# Patient Record
Sex: Female | Born: 1985 | Race: Black or African American | Hispanic: No | Marital: Single | Smoking: Never smoker
Health system: Southern US, Community
[De-identification: ages and names within clinical notes are randomized; demographics above are authoritative.]

## PROBLEM LIST (undated history)

## (undated) ENCOUNTER — Ambulatory Visit (HOSPITAL_COMMUNITY): Disposition: A | Discharge status: Other Acute Inpatient Hospital | Payer: No Typology Code available for payment source

## (undated) DIAGNOSIS — E119 Type 2 diabetes mellitus without complications: Secondary | ICD-10-CM

## (undated) DIAGNOSIS — E109 Type 1 diabetes mellitus without complications: Secondary | ICD-10-CM

## (undated) DIAGNOSIS — E1010 Type 1 diabetes mellitus with ketoacidosis without coma: Secondary | ICD-10-CM

---
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
## 2007-01-22 ENCOUNTER — Inpatient Hospital Stay (HOSPITAL_COMMUNITY): Admission: EM | Admit: 2007-01-22 | Discharge: 2007-01-25 | Payer: Self-pay | Admitting: Emergency Medicine

## 2007-11-28 ENCOUNTER — Inpatient Hospital Stay (HOSPITAL_COMMUNITY): Admission: EM | Admit: 2007-11-28 | Discharge: 2007-12-01 | Payer: Self-pay | Admitting: Emergency Medicine

## 2008-02-11 ENCOUNTER — Inpatient Hospital Stay (HOSPITAL_COMMUNITY): Admission: EM | Admit: 2008-02-11 | Discharge: 2008-02-14 | Payer: Self-pay | Admitting: Emergency Medicine

## 2008-08-22 ENCOUNTER — Emergency Department (HOSPITAL_COMMUNITY): Admission: EM | Admit: 2008-08-22 | Discharge: 2008-08-23 | Payer: Self-pay | Admitting: Emergency Medicine

## 2009-01-18 ENCOUNTER — Emergency Department (HOSPITAL_COMMUNITY): Admission: EM | Admit: 2009-01-18 | Discharge: 2009-01-18 | Payer: Self-pay | Admitting: Family Medicine

## 2009-05-09 ENCOUNTER — Inpatient Hospital Stay (HOSPITAL_COMMUNITY): Admission: EM | Admit: 2009-05-09 | Discharge: 2009-05-12 | Payer: Self-pay | Admitting: Emergency Medicine

## 2009-09-09 ENCOUNTER — Other Ambulatory Visit: Payer: Self-pay | Admitting: Gynecology

## 2009-09-09 ENCOUNTER — Other Ambulatory Visit: Payer: Self-pay | Admitting: Dermatology

## 2009-09-09 ENCOUNTER — Ambulatory Visit: Payer: Self-pay | Admitting: Advanced Practice Midwife

## 2009-09-09 ENCOUNTER — Emergency Department (HOSPITAL_COMMUNITY): Admission: EM | Admit: 2009-09-09 | Discharge: 2009-09-09 | Payer: Self-pay | Admitting: Emergency Medicine

## 2009-09-17 ENCOUNTER — Emergency Department (HOSPITAL_COMMUNITY): Admission: EM | Admit: 2009-09-17 | Discharge: 2009-09-17 | Payer: Self-pay | Admitting: Emergency Medicine

## 2009-12-23 ENCOUNTER — Inpatient Hospital Stay (HOSPITAL_COMMUNITY): Admission: EM | Admit: 2009-12-23 | Discharge: 2009-12-27 | Payer: Self-pay | Admitting: Emergency Medicine

## 2009-12-25 ENCOUNTER — Ambulatory Visit: Payer: Self-pay | Admitting: Vascular Surgery

## 2009-12-25 ENCOUNTER — Encounter (INDEPENDENT_AMBULATORY_CARE_PROVIDER_SITE_OTHER): Payer: Self-pay | Admitting: Internal Medicine

## 2010-10-14 LAB — BASIC METABOLIC PANEL
BUN: 8 mg/dL (ref 6–23)
BUN: 10 mg/dL (ref 6–23)
BUN: 11 mg/dL (ref 6–23)
BUN: 16 mg/dL (ref 6–23)
BUN: 20 mg/dL (ref 6–23)
BUN: 6 mg/dL (ref 6–23)
BUN: 7 mg/dL (ref 6–23)
BUN: 9 mg/dL (ref 6–23)
BUN: 9 mg/dL (ref 6–23)
CO2: 26 mEq/L (ref 19–32)
CO2: 10 mEq/L — ABNORMAL LOW (ref 19–32)
CO2: 13 mEq/L — ABNORMAL LOW (ref 19–32)
CO2: 14 mEq/L — ABNORMAL LOW (ref 19–32)
CO2: 16 mEq/L — ABNORMAL LOW (ref 19–32)
CO2: 19 mEq/L (ref 19–32)
CO2: 19 mEq/L (ref 19–32)
CO2: 19 mEq/L (ref 19–32)
CO2: 7 mEq/L — CL (ref 19–32)
Calcium: 8.7 mg/dL (ref 8.4–10.5)
Calcium: 7.7 mg/dL — ABNORMAL LOW (ref 8.4–10.5)
Calcium: 7.7 mg/dL — ABNORMAL LOW (ref 8.4–10.5)
Calcium: 7.9 mg/dL — ABNORMAL LOW (ref 8.4–10.5)
Calcium: 8.1 mg/dL — ABNORMAL LOW (ref 8.4–10.5)
Calcium: 8.1 mg/dL — ABNORMAL LOW (ref 8.4–10.5)
Calcium: 8.3 mg/dL — ABNORMAL LOW (ref 8.4–10.5)
Calcium: 8.3 mg/dL — ABNORMAL LOW (ref 8.4–10.5)
Calcium: 8.7 mg/dL (ref 8.4–10.5)
Chloride: 111 mEq/L (ref 96–112)
Chloride: 109 mEq/L (ref 96–112)
Chloride: 111 mEq/L (ref 96–112)
Chloride: 112 mEq/L (ref 96–112)
Chloride: 113 mEq/L — ABNORMAL HIGH (ref 96–112)
Chloride: 113 mEq/L — ABNORMAL HIGH (ref 96–112)
Chloride: 114 mEq/L — ABNORMAL HIGH (ref 96–112)
Chloride: 114 mEq/L — ABNORMAL HIGH (ref 96–112)
Chloride: 114 mEq/L — ABNORMAL HIGH (ref 96–112)
Creatinine, Ser: 0.5 mg/dL (ref 0.4–1.2)
Creatinine, Ser: 0.5 mg/dL (ref 0.4–1.2)
Creatinine, Ser: 0.61 mg/dL (ref 0.4–1.2)
Creatinine, Ser: 0.65 mg/dL (ref 0.4–1.2)
Creatinine, Ser: 0.67 mg/dL (ref 0.4–1.2)
Creatinine, Ser: 0.83 mg/dL (ref 0.4–1.2)
Creatinine, Ser: 0.94 mg/dL (ref 0.4–1.2)
Creatinine, Ser: 1.01 mg/dL (ref 0.4–1.2)
Creatinine, Ser: 1.4 mg/dL — ABNORMAL HIGH (ref 0.4–1.2)
GFR calc Af Amer: >60 mL/min (ref >60)
GFR calc Af Amer: 56 mL/min — ABNORMAL LOW (ref >60)
GFR calc Af Amer: >60 mL/min (ref >60)
GFR calc Af Amer: >60 mL/min (ref >60)
GFR calc Af Amer: >60 mL/min (ref >60)
GFR calc Af Amer: >60 mL/min (ref >60)
GFR calc Af Amer: >60 mL/min (ref >60)
GFR calc Af Amer: >60 mL/min (ref >60)
GFR calc Af Amer: >60 mL/min (ref >60)
GFR calc non Af Amer: >60 mL/min (ref >60)
GFR calc non Af Amer: 47 mL/min — ABNORMAL LOW (ref >60)
GFR calc non Af Amer: >60 mL/min (ref >60)
GFR calc non Af Amer: >60 mL/min (ref >60)
GFR calc non Af Amer: >60 mL/min (ref >60)
GFR calc non Af Amer: >60 mL/min (ref >60)
GFR calc non Af Amer: >60 mL/min (ref >60)
GFR calc non Af Amer: >60 mL/min (ref >60)
GFR calc non Af Amer: >60 mL/min (ref >60)
Glucose, Bld: 62 mg/dL — ABNORMAL LOW (ref 70–99)
Glucose, Bld: 122 mg/dL — ABNORMAL HIGH (ref 70–99)
Glucose, Bld: 129 mg/dL — ABNORMAL HIGH (ref 70–99)
Glucose, Bld: 144 mg/dL — ABNORMAL HIGH (ref 70–99)
Glucose, Bld: 179 mg/dL — ABNORMAL HIGH (ref 70–99)
Glucose, Bld: 187 mg/dL — ABNORMAL HIGH (ref 70–99)
Glucose, Bld: 261 mg/dL — ABNORMAL HIGH (ref 70–99)
Glucose, Bld: 391 mg/dL — ABNORMAL HIGH (ref 70–99)
Glucose, Bld: 74 mg/dL (ref 70–99)
Potassium: 3.7 mEq/L (ref 3.5–5.1)
Potassium: 3.2 mEq/L — ABNORMAL LOW (ref 3.5–5.1)
Potassium: 3.6 mEq/L (ref 3.5–5.1)
Potassium: 3.7 mEq/L (ref 3.5–5.1)
Potassium: 3.8 mEq/L (ref 3.5–5.1)
Potassium: 4.5 mEq/L (ref 3.5–5.1)
Potassium: 4.6 mEq/L (ref 3.5–5.1)
Potassium: 4.6 mEq/L (ref 3.5–5.1)
Potassium: 4.9 mEq/L (ref 3.5–5.1)
Sodium: 143 mEq/L (ref 135–145)
Sodium: 132 mEq/L — ABNORMAL LOW (ref 135–145)
Sodium: 133 mEq/L — ABNORMAL LOW (ref 135–145)
Sodium: 134 mEq/L — ABNORMAL LOW (ref 135–145)
Sodium: 134 mEq/L — ABNORMAL LOW (ref 135–145)
Sodium: 135 mEq/L (ref 135–145)
Sodium: 137 mEq/L (ref 135–145)
Sodium: 138 mEq/L (ref 135–145)
Sodium: 138 mEq/L (ref 135–145)

## 2010-10-14 LAB — LIPID PANEL
Cholesterol: 195 mg/dL (ref 0–200)
HDL: 79 mg/dL (ref >39)
LDL Cholesterol: 94 mg/dL (ref 0–99)
Total CHOL/HDL Ratio: 2.5 RATIO
Triglycerides: 108 mg/dL (ref <150)
VLDL: 22 mg/dL (ref 0–40)

## 2010-10-14 LAB — GLUCOSE, CAPILLARY
Glucose-Capillary: 123 mg/dL — ABNORMAL HIGH (ref 70–99)
Glucose-Capillary: 128 mg/dL — ABNORMAL HIGH (ref 70–99)
Glucose-Capillary: 144 mg/dL — ABNORMAL HIGH (ref 70–99)
Glucose-Capillary: 156 mg/dL — ABNORMAL HIGH (ref 70–99)
Glucose-Capillary: 228 mg/dL — ABNORMAL HIGH (ref 70–99)
Glucose-Capillary: 243 mg/dL — ABNORMAL HIGH (ref 70–99)
Glucose-Capillary: 328 mg/dL — ABNORMAL HIGH (ref 70–99)
Glucose-Capillary: 42 mg/dL — CL (ref 70–99)
Glucose-Capillary: 63 mg/dL — ABNORMAL LOW (ref 70–99)
Glucose-Capillary: 65 mg/dL — ABNORMAL LOW (ref 70–99)
Glucose-Capillary: 73 mg/dL (ref 70–99)
Glucose-Capillary: 94 mg/dL (ref 70–99)
Glucose-Capillary: 101 mg/dL — ABNORMAL HIGH (ref 70–99)
Glucose-Capillary: 118 mg/dL — ABNORMAL HIGH (ref 70–99)
Glucose-Capillary: 127 mg/dL — ABNORMAL HIGH (ref 70–99)
Glucose-Capillary: 130 mg/dL — ABNORMAL HIGH (ref 70–99)
Glucose-Capillary: 136 mg/dL — ABNORMAL HIGH (ref 70–99)
Glucose-Capillary: 137 mg/dL — ABNORMAL HIGH (ref 70–99)
Glucose-Capillary: 141 mg/dL — ABNORMAL HIGH (ref 70–99)
Glucose-Capillary: 143 mg/dL — ABNORMAL HIGH (ref 70–99)
Glucose-Capillary: 162 mg/dL — ABNORMAL HIGH (ref 70–99)
Glucose-Capillary: 162 mg/dL — ABNORMAL HIGH (ref 70–99)
Glucose-Capillary: 170 mg/dL — ABNORMAL HIGH (ref 70–99)
Glucose-Capillary: 174 mg/dL — ABNORMAL HIGH (ref 70–99)
Glucose-Capillary: 178 mg/dL — ABNORMAL HIGH (ref 70–99)
Glucose-Capillary: 182 mg/dL — ABNORMAL HIGH (ref 70–99)
Glucose-Capillary: 185 mg/dL — ABNORMAL HIGH (ref 70–99)
Glucose-Capillary: 190 mg/dL — ABNORMAL HIGH (ref 70–99)
Glucose-Capillary: 204 mg/dL — ABNORMAL HIGH (ref 70–99)
Glucose-Capillary: 204 mg/dL — ABNORMAL HIGH (ref 70–99)
Glucose-Capillary: 208 mg/dL — ABNORMAL HIGH (ref 70–99)
Glucose-Capillary: 220 mg/dL — ABNORMAL HIGH (ref 70–99)
Glucose-Capillary: 222 mg/dL — ABNORMAL HIGH (ref 70–99)
Glucose-Capillary: 249 mg/dL — ABNORMAL HIGH (ref 70–99)
Glucose-Capillary: 264 mg/dL — ABNORMAL HIGH (ref 70–99)
Glucose-Capillary: 330 mg/dL — ABNORMAL HIGH (ref 70–99)
Glucose-Capillary: 547 mg/dL — ABNORMAL HIGH (ref 70–99)
Glucose-Capillary: 61 mg/dL — ABNORMAL LOW (ref 70–99)
Glucose-Capillary: 74 mg/dL (ref 70–99)
Glucose-Capillary: 75 mg/dL (ref 70–99)
Glucose-Capillary: 75 mg/dL (ref 70–99)
Glucose-Capillary: 92 mg/dL (ref 70–99)

## 2010-10-14 LAB — LACTIC ACID, PLASMA: Lactic Acid, Venous: 2 mmol/L (ref 0.5–2.2)

## 2010-10-14 LAB — BLOOD GAS, ARTERIAL
Acid-base deficit: 22.7 mmol/L — ABNORMAL HIGH (ref 0.0–2.0)
Bicarbonate: 5.6 mEq/L — ABNORMAL LOW (ref 20.0–24.0)
O2 Content: 2 L/min
O2 Saturation: 98.2 %
Patient temperature: 98.6
TCO2: 5.3 mmol/L (ref 0–100)
pCO2 arterial: 17.1 mmHg — CL (ref 35.0–45.0)
pH, Arterial: 7.14 — CL (ref 7.350–7.400)
pO2, Arterial: 157 mmHg — ABNORMAL HIGH (ref 80.0–100.0)

## 2010-10-14 LAB — HEMOGLOBIN A1C
Hgb A1c MFr Bld: 13.4 % — ABNORMAL HIGH (ref <5.7)
Mean Plasma Glucose: 338 mg/dL — ABNORMAL HIGH (ref <117)

## 2010-10-14 LAB — RAPID STREP SCREEN (MED CTR MEBANE ONLY): Streptococcus, Group A Screen (Direct): NEGATIVE

## 2010-10-14 LAB — URINE MICROSCOPIC-ADD ON

## 2010-10-14 LAB — OVA AND PARASITE EXAMINATION

## 2010-10-14 LAB — URINE CULTURE
Colony Count: NO GROWTH
Culture: NO GROWTH
Special Requests: NEGATIVE

## 2010-10-14 LAB — POCT I-STAT, CHEM 8
BUN: 21 mg/dL (ref 6–23)
Calcium, Ion: 1.2 mmol/L (ref 1.12–1.32)
Chloride: 114 mEq/L — ABNORMAL HIGH (ref 96–112)
Creatinine, Ser: 0.7 mg/dL (ref 0.4–1.2)
Glucose, Bld: 420 mg/dL — ABNORMAL HIGH (ref 70–99)
HCT: 46 % (ref 36.0–46.0)
Hemoglobin: 15.6 g/dL — ABNORMAL HIGH (ref 12.0–15.0)
Potassium: 4.6 mEq/L (ref 3.5–5.1)
Sodium: 136 mEq/L (ref 135–145)
TCO2: 7 mmol/L (ref 0–100)

## 2010-10-14 LAB — HEPATIC FUNCTION PANEL
ALT: 15 U/L (ref 0–35)
ALT: 22 U/L (ref 0–35)
AST: 21 U/L (ref 0–37)
AST: 29 U/L (ref 0–37)
Albumin: 3 g/dL — ABNORMAL LOW (ref 3.5–5.2)
Albumin: 3.8 g/dL (ref 3.5–5.2)
Alkaline Phosphatase: 49 U/L (ref 39–117)
Alkaline Phosphatase: 67 U/L (ref 39–117)
Bilirubin, Direct: 0.1 mg/dL (ref 0.0–0.3)
Bilirubin, Direct: 0.1 mg/dL (ref 0.0–0.3)
Indirect Bilirubin: 0.5 mg/dL (ref 0.3–0.9)
Indirect Bilirubin: 1.1 mg/dL — ABNORMAL HIGH (ref 0.3–0.9)
Total Bilirubin: 0.6 mg/dL (ref 0.3–1.2)
Total Bilirubin: 1.2 mg/dL (ref 0.3–1.2)
Total Protein: 6.1 g/dL (ref 6.0–8.3)
Total Protein: 7.5 g/dL (ref 6.0–8.3)

## 2010-10-14 LAB — URINALYSIS, ROUTINE W REFLEX MICROSCOPIC
Bilirubin Urine: NEGATIVE
Glucose, UA: >1000 mg/dL — AB
Hgb urine dipstick: NEGATIVE
Ketones, ur: >80 mg/dL — AB
Leukocytes, UA: NEGATIVE
Nitrite: NEGATIVE
Protein, ur: 30 mg/dL — AB
Specific Gravity, Urine: 1.026 (ref 1.005–1.030)
Urobilinogen, UA: 0.2 mg/dL (ref 0.0–1.0)
pH: 5 (ref 5.0–8.0)

## 2010-10-14 LAB — URINE DRUGS OF ABUSE SCREEN W ALC, ROUTINE (REF LAB)
Amphetamine Screen, Ur: NEGATIVE
Barbiturate Quant, Ur: NEGATIVE
Benzodiazepines.: NEGATIVE
Cocaine Metabolites: NEGATIVE
Creatinine,U: 53.1 mg/dL
Ethyl Alcohol: <10 mg/dL (ref <10)
Marijuana Metabolite: NEGATIVE
Methadone: NEGATIVE
Opiate Screen, Urine: NEGATIVE
Phencyclidine (PCP): NEGATIVE
Propoxyphene: NEGATIVE

## 2010-10-14 LAB — PREGNANCY, URINE: Preg Test, Ur: NEGATIVE

## 2010-10-14 LAB — DIFFERENTIAL
Basophils Absolute: 0 10*3/uL (ref 0.0–0.1)
Basophils Relative: 0 % (ref 0–1)
Eosinophils Absolute: 0 10*3/uL (ref 0.0–0.7)
Eosinophils Relative: 0 % (ref 0–5)
Lymphocytes Relative: 7 % — ABNORMAL LOW (ref 12–46)
Lymphs Abs: 1.3 10*3/uL (ref 0.7–4.0)
Monocytes Absolute: 0.4 10*3/uL (ref 0.1–1.0)
Monocytes Relative: 2 % — ABNORMAL LOW (ref 3–12)
Neutro Abs: 16.2 10*3/uL — ABNORMAL HIGH (ref 1.7–7.7)
Neutrophils Relative %: 90 % — ABNORMAL HIGH (ref 43–77)

## 2010-10-14 LAB — STOOL CULTURE

## 2010-10-14 LAB — CBC
HCT: 34.5 % — ABNORMAL LOW (ref 36.0–46.0)
HCT: 36.6 % (ref 36.0–46.0)
HCT: 41.7 % (ref 36.0–46.0)
Hemoglobin: 11.2 g/dL — ABNORMAL LOW (ref 12.0–15.0)
Hemoglobin: 12.1 g/dL (ref 12.0–15.0)
Hemoglobin: 13.5 g/dL (ref 12.0–15.0)
MCHC: 32.5 g/dL (ref 30.0–36.0)
MCHC: 32.3 g/dL (ref 30.0–36.0)
MCHC: 32.7 g/dL (ref 30.0–36.0)
MCV: 85.3 fL (ref 78.0–100.0)
MCV: 85.3 fL (ref 78.0–100.0)
MCV: 85.8 fL (ref 78.0–100.0)
Platelets: 263 10*3/uL (ref 150–400)
Platelets: 289 10*3/uL (ref 150–400)
Platelets: 349 10*3/uL (ref 150–400)
RBC: 4.05 MIL/uL (ref 3.87–5.11)
RBC: 4.3 MIL/uL (ref 3.87–5.11)
RBC: 4.86 MIL/uL (ref 3.87–5.11)
RDW: 16.5 % — ABNORMAL HIGH (ref 11.5–15.5)
RDW: 15.4 % (ref 11.5–15.5)
RDW: 15.6 % — ABNORMAL HIGH (ref 11.5–15.5)
WBC: 4.7 10*3/uL (ref 4.0–10.5)
WBC: 11.4 10*3/uL — ABNORMAL HIGH (ref 4.0–10.5)
WBC: 18 10*3/uL — ABNORMAL HIGH (ref 4.0–10.5)

## 2010-10-14 LAB — CULTURE, BLOOD (ROUTINE X 2)
Culture: NO GROWTH
Culture: NO GROWTH

## 2010-10-14 LAB — MRSA PCR SCREENING: MRSA by PCR: POSITIVE — AB

## 2010-10-14 LAB — CLOSTRIDIUM DIFFICILE EIA: C difficile Toxins A+B, EIA: NEGATIVE

## 2010-10-14 LAB — D-DIMER, QUANTITATIVE: D-Dimer, Quant: 0.7 ug/mL-FEU — ABNORMAL HIGH (ref 0.00–0.48)

## 2010-10-14 LAB — TSH: TSH: 0.596 u[IU]/mL (ref 0.350–4.500)

## 2010-10-14 LAB — LIPASE, BLOOD: Lipase: 19 U/L (ref 11–59)

## 2010-10-14 LAB — CK TOTAL AND CKMB (NOT AT ARMC)
CK, MB: 1.2 ng/mL (ref 0.3–4.0)
Relative Index: INVALID (ref 0.0–2.5)
Total CK: 27 U/L (ref 7–177)

## 2010-10-14 LAB — MONONUCLEOSIS SCREEN: Mono Screen: NEGATIVE

## 2010-10-14 LAB — TROPONIN I: Troponin I: 0.01 ng/mL (ref 0.00–0.06)

## 2010-10-16 LAB — LIPASE, BLOOD
Lipase: 26 U/L (ref 11–59)
Lipase: 29 U/L (ref 11–59)

## 2010-10-16 LAB — COMPREHENSIVE METABOLIC PANEL
ALT: 12 U/L (ref 0–35)
AST: 14 U/L (ref 0–37)
Albumin: 3.6 g/dL (ref 3.5–5.2)
Alkaline Phosphatase: 47 U/L (ref 39–117)
BUN: 12 mg/dL (ref 6–23)
CO2: 27 mEq/L (ref 19–32)
Calcium: 8.6 mg/dL (ref 8.4–10.5)
Chloride: 105 mEq/L (ref 96–112)
Creatinine, Ser: 0.58 mg/dL (ref 0.4–1.2)
GFR calc Af Amer: >60 mL/min (ref >60)
GFR calc non Af Amer: >60 mL/min (ref >60)
Glucose, Bld: 231 mg/dL — ABNORMAL HIGH (ref 70–99)
Potassium: 3.9 mEq/L (ref 3.5–5.1)
Sodium: 137 mEq/L (ref 135–145)
Total Bilirubin: 0.5 mg/dL (ref 0.3–1.2)
Total Protein: 7.1 g/dL (ref 6.0–8.3)

## 2010-10-16 LAB — GLUCOSE, CAPILLARY
Glucose-Capillary: 128 mg/dL — ABNORMAL HIGH (ref 70–99)
Glucose-Capillary: 187 mg/dL — ABNORMAL HIGH (ref 70–99)
Glucose-Capillary: 243 mg/dL — ABNORMAL HIGH (ref 70–99)
Glucose-Capillary: 429 mg/dL — ABNORMAL HIGH (ref 70–99)
Glucose-Capillary: 75 mg/dL (ref 70–99)

## 2010-10-16 LAB — URINALYSIS, ROUTINE W REFLEX MICROSCOPIC
Bilirubin Urine: NEGATIVE
Glucose, UA: >1000 mg/dL — AB
Hgb urine dipstick: NEGATIVE
Ketones, ur: NEGATIVE mg/dL
Leukocytes, UA: NEGATIVE
Nitrite: NEGATIVE
Protein, ur: NEGATIVE mg/dL
Specific Gravity, Urine: 1.023 (ref 1.005–1.030)
Urobilinogen, UA: 0.2 mg/dL (ref 0.0–1.0)
pH: 6 (ref 5.0–8.0)

## 2010-10-16 LAB — BLOOD GAS, VENOUS
Acid-Base Excess: 1.5 mmol/L (ref 0.0–2.0)
Bicarbonate: 26 mEq/L — ABNORMAL HIGH (ref 20.0–24.0)
Drawn by: 308601
FIO2: 0.21 %
O2 Saturation: 83.6 %
Patient temperature: 98.6
TCO2: 23.8 mmol/L (ref 0–100)
pCO2, Ven: 42.9 mmHg — ABNORMAL LOW (ref 45.0–50.0)
pH, Ven: 7.399 — ABNORMAL HIGH (ref 7.250–7.300)
pO2, Ven: 49.6 mmHg — ABNORMAL HIGH (ref 30.0–45.0)

## 2010-10-16 LAB — DIFFERENTIAL
Basophils Absolute: 0.1 10*3/uL (ref 0.0–0.1)
Basophils Relative: 1 % (ref 0–1)
Eosinophils Absolute: 0.1 10*3/uL (ref 0.0–0.7)
Eosinophils Relative: 3 % (ref 0–5)
Lymphocytes Relative: 34 % (ref 12–46)
Lymphs Abs: 1.7 10*3/uL (ref 0.7–4.0)
Monocytes Absolute: 0.4 10*3/uL (ref 0.1–1.0)
Monocytes Relative: 9 % (ref 3–12)
Neutro Abs: 2.6 10*3/uL (ref 1.7–7.7)
Neutrophils Relative %: 53 % (ref 43–77)

## 2010-10-16 LAB — CBC
HCT: 38.6 % (ref 36.0–46.0)
Hemoglobin: 12.9 g/dL (ref 12.0–15.0)
MCHC: 33.3 g/dL (ref 30.0–36.0)
MCV: 83.8 fL (ref 78.0–100.0)
Platelets: 320 10*3/uL (ref 150–400)
RBC: 4.61 MIL/uL (ref 3.87–5.11)
RDW: 15.5 % (ref 11.5–15.5)
WBC: 4.9 10*3/uL (ref 4.0–10.5)

## 2010-10-16 LAB — URINE MICROSCOPIC-ADD ON

## 2010-10-16 LAB — POCT PREGNANCY, URINE: Preg Test, Ur: NEGATIVE

## 2010-10-16 LAB — KETONES, QUALITATIVE

## 2010-10-17 LAB — URINALYSIS, ROUTINE W REFLEX MICROSCOPIC
Bilirubin Urine: NEGATIVE
Glucose, UA: >1000 mg/dL — AB
Hgb urine dipstick: NEGATIVE
Ketones, ur: NEGATIVE mg/dL
Leukocytes, UA: NEGATIVE
Nitrite: NEGATIVE
Protein, ur: NEGATIVE mg/dL
Specific Gravity, Urine: <1.005 — ABNORMAL LOW (ref 1.005–1.030)
Urobilinogen, UA: 0.2 mg/dL (ref 0.0–1.0)
pH: 5.5 (ref 5.0–8.0)

## 2010-10-17 LAB — GLUCOSE, CAPILLARY: Glucose-Capillary: 498 mg/dL — ABNORMAL HIGH (ref 70–99)

## 2010-10-17 LAB — POCT PREGNANCY, URINE: Preg Test, Ur: NEGATIVE

## 2010-10-17 LAB — COMPREHENSIVE METABOLIC PANEL
ALT: 12 U/L (ref 0–35)
AST: 19 U/L (ref 0–37)
Albumin: 3.5 g/dL (ref 3.5–5.2)
Alkaline Phosphatase: 55 U/L (ref 39–117)
BUN: 19 mg/dL (ref 6–23)
CO2: 26 mEq/L (ref 19–32)
Calcium: 9.1 mg/dL (ref 8.4–10.5)
Chloride: 96 mEq/L (ref 96–112)
Creatinine, Ser: 1.15 mg/dL (ref 0.4–1.2)
GFR calc Af Amer: >60 mL/min (ref >60)
GFR calc non Af Amer: 59 mL/min — ABNORMAL LOW (ref >60)
Glucose, Bld: 539 mg/dL — ABNORMAL HIGH (ref 70–99)
Potassium: 4.1 mEq/L (ref 3.5–5.1)
Sodium: 131 mEq/L — ABNORMAL LOW (ref 135–145)
Total Bilirubin: 0.8 mg/dL (ref 0.3–1.2)
Total Protein: 6.6 g/dL (ref 6.0–8.3)

## 2010-10-17 LAB — URINE MICROSCOPIC-ADD ON
RBC / HPF: NONE SEEN RBC/hpf (ref <3)
WBC, UA: NONE SEEN WBC/hpf (ref <3)

## 2010-10-17 LAB — CBC
HCT: 34 % — ABNORMAL LOW (ref 36.0–46.0)
Hemoglobin: 11.1 g/dL — ABNORMAL LOW (ref 12.0–15.0)
MCHC: 32.7 g/dL (ref 30.0–36.0)
MCV: 84.4 fL (ref 78.0–100.0)
Platelets: 283 10*3/uL (ref 150–400)
RBC: 4.03 MIL/uL (ref 3.87–5.11)
RDW: 15.4 % (ref 11.5–15.5)
WBC: 6.3 10*3/uL (ref 4.0–10.5)

## 2010-10-31 LAB — COMPREHENSIVE METABOLIC PANEL
ALT: 9 U/L (ref 0–35)
AST: 15 U/L (ref 0–37)
Albumin: 2.8 g/dL — ABNORMAL LOW (ref 3.5–5.2)
Alkaline Phosphatase: 40 U/L (ref 39–117)
BUN: 7 mg/dL (ref 6–23)
CO2: 25 mEq/L (ref 19–32)
Calcium: 8 mg/dL — ABNORMAL LOW (ref 8.4–10.5)
Chloride: 108 mEq/L (ref 96–112)
Creatinine, Ser: 0.48 mg/dL (ref 0.4–1.2)
GFR calc Af Amer: >60 mL/min (ref >60)
GFR calc non Af Amer: >60 mL/min (ref >60)
Glucose, Bld: 117 mg/dL — ABNORMAL HIGH (ref 70–99)
Potassium: 2.8 mEq/L — ABNORMAL LOW (ref 3.5–5.1)
Sodium: 137 mEq/L (ref 135–145)
Total Bilirubin: 0.8 mg/dL (ref 0.3–1.2)
Total Protein: 5.4 g/dL — ABNORMAL LOW (ref 6.0–8.3)

## 2010-10-31 LAB — HEMOGLOBIN A1C
Hgb A1c MFr Bld: 11.8 % — ABNORMAL HIGH (ref 4.6–6.1)
Mean Plasma Glucose: 292 mg/dL

## 2010-10-31 LAB — GLUCOSE, CAPILLARY
Glucose-Capillary: 179 mg/dL — ABNORMAL HIGH (ref 70–99)
Glucose-Capillary: 272 mg/dL — ABNORMAL HIGH (ref 70–99)
Glucose-Capillary: 69 mg/dL — ABNORMAL LOW (ref 70–99)
Glucose-Capillary: 71 mg/dL (ref 70–99)
Glucose-Capillary: 116 mg/dL — ABNORMAL HIGH (ref 70–99)
Glucose-Capillary: 125 mg/dL — ABNORMAL HIGH (ref 70–99)
Glucose-Capillary: 135 mg/dL — ABNORMAL HIGH (ref 70–99)
Glucose-Capillary: 140 mg/dL — ABNORMAL HIGH (ref 70–99)
Glucose-Capillary: 151 mg/dL — ABNORMAL HIGH (ref 70–99)
Glucose-Capillary: 151 mg/dL — ABNORMAL HIGH (ref 70–99)
Glucose-Capillary: 153 mg/dL — ABNORMAL HIGH (ref 70–99)
Glucose-Capillary: 157 mg/dL — ABNORMAL HIGH (ref 70–99)
Glucose-Capillary: 160 mg/dL — ABNORMAL HIGH (ref 70–99)
Glucose-Capillary: 171 mg/dL — ABNORMAL HIGH (ref 70–99)
Glucose-Capillary: 181 mg/dL — ABNORMAL HIGH (ref 70–99)
Glucose-Capillary: 182 mg/dL — ABNORMAL HIGH (ref 70–99)
Glucose-Capillary: 199 mg/dL — ABNORMAL HIGH (ref 70–99)
Glucose-Capillary: 220 mg/dL — ABNORMAL HIGH (ref 70–99)
Glucose-Capillary: 225 mg/dL — ABNORMAL HIGH (ref 70–99)
Glucose-Capillary: 231 mg/dL — ABNORMAL HIGH (ref 70–99)
Glucose-Capillary: 245 mg/dL — ABNORMAL HIGH (ref 70–99)
Glucose-Capillary: 259 mg/dL — ABNORMAL HIGH (ref 70–99)
Glucose-Capillary: 267 mg/dL — ABNORMAL HIGH (ref 70–99)
Glucose-Capillary: 294 mg/dL — ABNORMAL HIGH (ref 70–99)
Glucose-Capillary: 75 mg/dL (ref 70–99)

## 2010-10-31 LAB — URINALYSIS, ROUTINE W REFLEX MICROSCOPIC
Bilirubin Urine: NEGATIVE
Glucose, UA: >1000 mg/dL — AB
Ketones, ur: >80 mg/dL — AB
Leukocytes, UA: NEGATIVE
Nitrite: NEGATIVE
Protein, ur: 100 mg/dL — AB
Specific Gravity, Urine: 1.034 — ABNORMAL HIGH (ref 1.005–1.030)
Urobilinogen, UA: 0.2 mg/dL (ref 0.0–1.0)
pH: 5.5 (ref 5.0–8.0)

## 2010-10-31 LAB — BASIC METABOLIC PANEL
BUN: 7 mg/dL (ref 6–23)
BUN: 10 mg/dL (ref 6–23)
BUN: 13 mg/dL (ref 6–23)
BUN: 4 mg/dL — ABNORMAL LOW (ref 6–23)
BUN: 6 mg/dL (ref 6–23)
BUN: 6 mg/dL (ref 6–23)
BUN: 7 mg/dL (ref 6–23)
BUN: 7 mg/dL (ref 6–23)
BUN: 8 mg/dL (ref 6–23)
BUN: 9 mg/dL (ref 6–23)
BUN: 9 mg/dL (ref 6–23)
CO2: 29 mEq/L (ref 19–32)
CO2: 10 mEq/L — ABNORMAL LOW (ref 19–32)
CO2: 11 mEq/L — ABNORMAL LOW (ref 19–32)
CO2: 16 mEq/L — ABNORMAL LOW (ref 19–32)
CO2: 17 mEq/L — ABNORMAL LOW (ref 19–32)
CO2: 20 mEq/L (ref 19–32)
CO2: 21 mEq/L (ref 19–32)
CO2: 23 mEq/L (ref 19–32)
CO2: 24 mEq/L (ref 19–32)
CO2: 27 mEq/L (ref 19–32)
CO2: 5 mEq/L — CL (ref 19–32)
Calcium: 8.5 mg/dL (ref 8.4–10.5)
Calcium: 7.8 mg/dL — ABNORMAL LOW (ref 8.4–10.5)
Calcium: 7.9 mg/dL — ABNORMAL LOW (ref 8.4–10.5)
Calcium: 8.1 mg/dL — ABNORMAL LOW (ref 8.4–10.5)
Calcium: 8.1 mg/dL — ABNORMAL LOW (ref 8.4–10.5)
Calcium: 8.1 mg/dL — ABNORMAL LOW (ref 8.4–10.5)
Calcium: 8.2 mg/dL — ABNORMAL LOW (ref 8.4–10.5)
Calcium: 8.2 mg/dL — ABNORMAL LOW (ref 8.4–10.5)
Calcium: 8.4 mg/dL (ref 8.4–10.5)
Calcium: 8.4 mg/dL (ref 8.4–10.5)
Calcium: 9.3 mg/dL (ref 8.4–10.5)
Chloride: 105 mEq/L (ref 96–112)
Chloride: 103 mEq/L (ref 96–112)
Chloride: 104 mEq/L (ref 96–112)
Chloride: 106 mEq/L (ref 96–112)
Chloride: 107 mEq/L (ref 96–112)
Chloride: 108 mEq/L (ref 96–112)
Chloride: 110 mEq/L (ref 96–112)
Chloride: 112 mEq/L (ref 96–112)
Chloride: 112 mEq/L (ref 96–112)
Chloride: 114 mEq/L — ABNORMAL HIGH (ref 96–112)
Chloride: 116 mEq/L — ABNORMAL HIGH (ref 96–112)
Creatinine, Ser: 0.44 mg/dL (ref 0.4–1.2)
Creatinine, Ser: 0.42 mg/dL (ref 0.4–1.2)
Creatinine, Ser: 0.42 mg/dL (ref 0.4–1.2)
Creatinine, Ser: 0.5 mg/dL (ref 0.4–1.2)
Creatinine, Ser: 0.55 mg/dL (ref 0.4–1.2)
Creatinine, Ser: 0.63 mg/dL (ref 0.4–1.2)
Creatinine, Ser: 0.67 mg/dL (ref 0.4–1.2)
Creatinine, Ser: 0.7 mg/dL (ref 0.4–1.2)
Creatinine, Ser: 0.73 mg/dL (ref 0.4–1.2)
Creatinine, Ser: 0.76 mg/dL (ref 0.4–1.2)
Creatinine, Ser: 0.98 mg/dL (ref 0.4–1.2)
GFR calc Af Amer: >60 mL/min (ref >60)
GFR calc Af Amer: >60 mL/min (ref >60)
GFR calc Af Amer: >60 mL/min (ref >60)
GFR calc Af Amer: >60 mL/min (ref >60)
GFR calc Af Amer: >60 mL/min (ref >60)
GFR calc Af Amer: >60 mL/min (ref >60)
GFR calc Af Amer: >60 mL/min (ref >60)
GFR calc Af Amer: >60 mL/min (ref >60)
GFR calc Af Amer: >60 mL/min (ref >60)
GFR calc Af Amer: >60 mL/min (ref >60)
GFR calc Af Amer: >60 mL/min (ref >60)
GFR calc non Af Amer: >60 mL/min (ref >60)
GFR calc non Af Amer: >60 mL/min (ref >60)
GFR calc non Af Amer: >60 mL/min (ref >60)
GFR calc non Af Amer: >60 mL/min (ref >60)
GFR calc non Af Amer: >60 mL/min (ref >60)
GFR calc non Af Amer: >60 mL/min (ref >60)
GFR calc non Af Amer: >60 mL/min (ref >60)
GFR calc non Af Amer: >60 mL/min (ref >60)
GFR calc non Af Amer: >60 mL/min (ref >60)
GFR calc non Af Amer: >60 mL/min (ref >60)
GFR calc non Af Amer: >60 mL/min (ref >60)
Glucose, Bld: 97 mg/dL (ref 70–99)
Glucose, Bld: 104 mg/dL — ABNORMAL HIGH (ref 70–99)
Glucose, Bld: 114 mg/dL — ABNORMAL HIGH (ref 70–99)
Glucose, Bld: 146 mg/dL — ABNORMAL HIGH (ref 70–99)
Glucose, Bld: 162 mg/dL — ABNORMAL HIGH (ref 70–99)
Glucose, Bld: 163 mg/dL — ABNORMAL HIGH (ref 70–99)
Glucose, Bld: 164 mg/dL — ABNORMAL HIGH (ref 70–99)
Glucose, Bld: 174 mg/dL — ABNORMAL HIGH (ref 70–99)
Glucose, Bld: 218 mg/dL — ABNORMAL HIGH (ref 70–99)
Glucose, Bld: 270 mg/dL — ABNORMAL HIGH (ref 70–99)
Glucose, Bld: 272 mg/dL — ABNORMAL HIGH (ref 70–99)
Potassium: 3.1 mEq/L — ABNORMAL LOW (ref 3.5–5.1)
Potassium: 2.8 mEq/L — ABNORMAL LOW (ref 3.5–5.1)
Potassium: 3.1 mEq/L — ABNORMAL LOW (ref 3.5–5.1)
Potassium: 3.2 mEq/L — ABNORMAL LOW (ref 3.5–5.1)
Potassium: 3.5 mEq/L (ref 3.5–5.1)
Potassium: 3.8 mEq/L (ref 3.5–5.1)
Potassium: 3.9 mEq/L (ref 3.5–5.1)
Potassium: 3.9 mEq/L (ref 3.5–5.1)
Potassium: 4 mEq/L (ref 3.5–5.1)
Potassium: 4.2 mEq/L (ref 3.5–5.1)
Potassium: 4.8 mEq/L (ref 3.5–5.1)
Sodium: 140 mEq/L (ref 135–145)
Sodium: 129 mEq/L — ABNORMAL LOW (ref 135–145)
Sodium: 130 mEq/L — ABNORMAL LOW (ref 135–145)
Sodium: 132 mEq/L — ABNORMAL LOW (ref 135–145)
Sodium: 134 mEq/L — ABNORMAL LOW (ref 135–145)
Sodium: 134 mEq/L — ABNORMAL LOW (ref 135–145)
Sodium: 135 mEq/L (ref 135–145)
Sodium: 136 mEq/L (ref 135–145)
Sodium: 136 mEq/L (ref 135–145)
Sodium: 137 mEq/L (ref 135–145)
Sodium: 141 mEq/L (ref 135–145)

## 2010-10-31 LAB — CBC
HCT: 34.9 % — ABNORMAL LOW (ref 36.0–46.0)
HCT: 32 % — ABNORMAL LOW (ref 36.0–46.0)
HCT: 46.5 % — ABNORMAL HIGH (ref 36.0–46.0)
Hemoglobin: 11.6 g/dL — ABNORMAL LOW (ref 12.0–15.0)
Hemoglobin: 10.5 g/dL — ABNORMAL LOW (ref 12.0–15.0)
Hemoglobin: 15 g/dL (ref 12.0–15.0)
MCHC: 33.1 g/dL (ref 30.0–36.0)
MCHC: 32.3 g/dL (ref 30.0–36.0)
MCHC: 32.8 g/dL (ref 30.0–36.0)
MCV: 83.7 fL (ref 78.0–100.0)
MCV: 84.4 fL (ref 78.0–100.0)
MCV: 85.1 fL (ref 78.0–100.0)
Platelets: 333 10*3/uL (ref 150–400)
Platelets: 317 10*3/uL (ref 150–400)
Platelets: 438 10*3/uL — ABNORMAL HIGH (ref 150–400)
RBC: 4.17 MIL/uL (ref 3.87–5.11)
RBC: 3.8 MIL/uL — ABNORMAL LOW (ref 3.87–5.11)
RBC: 5.47 MIL/uL — ABNORMAL HIGH (ref 3.87–5.11)
RDW: 15.7 % — ABNORMAL HIGH (ref 11.5–15.5)
RDW: 15.1 % (ref 11.5–15.5)
RDW: 15.3 % (ref 11.5–15.5)
WBC: 4.2 10*3/uL (ref 4.0–10.5)
WBC: 4.3 10*3/uL (ref 4.0–10.5)
WBC: 7.2 10*3/uL (ref 4.0–10.5)

## 2010-10-31 LAB — DIFFERENTIAL
Basophils Absolute: 0 10*3/uL (ref 0.0–0.1)
Basophils Absolute: 0 10*3/uL (ref 0.0–0.1)
Basophils Absolute: 0 10*3/uL (ref 0.0–0.1)
Basophils Relative: 1 % (ref 0–1)
Basophils Relative: 0 % (ref 0–1)
Basophils Relative: 1 % (ref 0–1)
Eosinophils Absolute: 0.1 10*3/uL (ref 0.0–0.7)
Eosinophils Absolute: 0 10*3/uL (ref 0.0–0.7)
Eosinophils Absolute: 0.2 10*3/uL (ref 0.0–0.7)
Eosinophils Relative: 1 % (ref 0–5)
Eosinophils Relative: 0 % (ref 0–5)
Eosinophils Relative: 4 % (ref 0–5)
Lymphocytes Relative: 46 % (ref 12–46)
Lymphocytes Relative: 23 % (ref 12–46)
Lymphocytes Relative: 44 % (ref 12–46)
Lymphs Abs: 1.9 10*3/uL (ref 0.7–4.0)
Lymphs Abs: 1.6 10*3/uL (ref 0.7–4.0)
Lymphs Abs: 1.9 10*3/uL (ref 0.7–4.0)
Monocytes Absolute: 0.7 10*3/uL (ref 0.1–1.0)
Monocytes Absolute: 0.4 10*3/uL (ref 0.1–1.0)
Monocytes Absolute: 0.7 10*3/uL (ref 0.1–1.0)
Monocytes Relative: 16 % — ABNORMAL HIGH (ref 3–12)
Monocytes Relative: 16 % — ABNORMAL HIGH (ref 3–12)
Monocytes Relative: 6 % (ref 3–12)
Neutro Abs: 1.5 10*3/uL — ABNORMAL LOW (ref 1.7–7.7)
Neutro Abs: 1.5 10*3/uL — ABNORMAL LOW (ref 1.7–7.7)
Neutro Abs: 5.1 10*3/uL (ref 1.7–7.7)
Neutrophils Relative %: 36 % — ABNORMAL LOW (ref 43–77)
Neutrophils Relative %: 36 % — ABNORMAL LOW (ref 43–77)
Neutrophils Relative %: 71 % (ref 43–77)

## 2010-10-31 LAB — HEPATIC FUNCTION PANEL
ALT: 16 U/L (ref 0–35)
AST: 22 U/L (ref 0–37)
Albumin: 4.7 g/dL (ref 3.5–5.2)
Alkaline Phosphatase: 72 U/L (ref 39–117)
Bilirubin, Direct: 0.1 mg/dL (ref 0.0–0.3)
Indirect Bilirubin: 1.3 mg/dL — ABNORMAL HIGH (ref 0.3–0.9)
Total Bilirubin: 1.4 mg/dL — ABNORMAL HIGH (ref 0.3–1.2)
Total Protein: 9.4 g/dL — ABNORMAL HIGH (ref 6.0–8.3)

## 2010-10-31 LAB — CULTURE, BLOOD (ROUTINE X 2)
Culture: NO GROWTH
Culture: NO GROWTH

## 2010-10-31 LAB — URINE CULTURE
Colony Count: NO GROWTH
Culture: NO GROWTH
Special Requests: NEGATIVE

## 2010-10-31 LAB — URINE MICROSCOPIC-ADD ON

## 2010-10-31 LAB — RAPID STREP SCREEN (MED CTR MEBANE ONLY): Streptococcus, Group A Screen (Direct): NEGATIVE

## 2010-10-31 LAB — POCT PREGNANCY, URINE: Preg Test, Ur: NEGATIVE

## 2010-11-11 LAB — DIFFERENTIAL
Basophils Absolute: 0 10*3/uL (ref 0.0–0.1)
Basophils Relative: 0 % (ref 0–1)
Eosinophils Absolute: 0.1 10*3/uL (ref 0.0–0.7)
Eosinophils Relative: 1 % (ref 0–5)
Lymphocytes Relative: 5 % — ABNORMAL LOW (ref 12–46)
Lymphs Abs: 0.5 10*3/uL — ABNORMAL LOW (ref 0.7–4.0)
Monocytes Absolute: 0.8 10*3/uL (ref 0.1–1.0)
Monocytes Relative: 7 % (ref 3–12)
Neutro Abs: 9.8 10*3/uL — ABNORMAL HIGH (ref 1.7–7.7)
Neutrophils Relative %: 87 % — ABNORMAL HIGH (ref 43–77)

## 2010-11-11 LAB — URINE MICROSCOPIC-ADD ON

## 2010-11-11 LAB — COMPREHENSIVE METABOLIC PANEL
ALT: 15 U/L (ref 0–35)
AST: 17 U/L (ref 0–37)
Albumin: 3.6 g/dL (ref 3.5–5.2)
Alkaline Phosphatase: 46 U/L (ref 39–117)
BUN: 11 mg/dL (ref 6–23)
CO2: 21 mEq/L (ref 19–32)
Calcium: 8.3 mg/dL — ABNORMAL LOW (ref 8.4–10.5)
Chloride: 107 mEq/L (ref 96–112)
Creatinine, Ser: 0.48 mg/dL (ref 0.4–1.2)
GFR calc Af Amer: >60 mL/min (ref >60)
GFR calc non Af Amer: >60 mL/min (ref >60)
Glucose, Bld: 93 mg/dL (ref 70–99)
Potassium: 3.3 mEq/L — ABNORMAL LOW (ref 3.5–5.1)
Sodium: 136 mEq/L (ref 135–145)
Total Bilirubin: 1.3 mg/dL — ABNORMAL HIGH (ref 0.3–1.2)
Total Protein: 6.4 g/dL (ref 6.0–8.3)

## 2010-11-11 LAB — URINALYSIS, ROUTINE W REFLEX MICROSCOPIC
Bilirubin Urine: NEGATIVE
Glucose, UA: NEGATIVE mg/dL
Hgb urine dipstick: NEGATIVE
Ketones, ur: 15 mg/dL — AB
Leukocytes, UA: NEGATIVE
Nitrite: NEGATIVE
Protein, ur: 30 mg/dL — AB
Specific Gravity, Urine: 1.026 (ref 1.005–1.030)
Urobilinogen, UA: 0.2 mg/dL (ref 0.0–1.0)
pH: 6 (ref 5.0–8.0)

## 2010-11-11 LAB — CBC
HCT: 45 % (ref 36.0–46.0)
Hemoglobin: 14.5 g/dL (ref 12.0–15.0)
MCHC: 32.3 g/dL (ref 30.0–36.0)
MCV: 85.6 fL (ref 78.0–100.0)
Platelets: 306 10*3/uL (ref 150–400)
RBC: 5.25 MIL/uL — ABNORMAL HIGH (ref 3.87–5.11)
RDW: 14.9 % (ref 11.5–15.5)
WBC: 11.3 10*3/uL — ABNORMAL HIGH (ref 4.0–10.5)

## 2010-11-11 LAB — GLUCOSE, CAPILLARY: Glucose-Capillary: 68 mg/dL — ABNORMAL LOW (ref 70–99)

## 2010-11-11 LAB — PREGNANCY, URINE: Preg Test, Ur: NEGATIVE

## 2010-11-11 LAB — LIPASE, BLOOD: Lipase: 23 U/L (ref 11–59)

## 2010-12-10 NOTE — Discharge Summary (Signed)
[name], [name]              ACCOUNT NO.:  [id number]   MEDICAL RECORD NO.:  [id number]          PATIENT TYPE:  INP   LOCATION:  1344                         FACILITY:  [facility]   PHYSICIAN:  Elliot Cousin, M.D.    DATE OF BIRTH:  1985/08/07   DATE OF ADMISSION:  02/11/2008  DATE OF DISCHARGE:  02/14/2008                               DISCHARGE SUMMARY   DISCHARGE DIAGNOSES:  1. Type 1 diabetes mellitus with diabetic ketoacidosis.  2. Hyponatremia and hypokalemia.  3. Right axillary adenopathy, possibly secondary to folliculitis.   DISCHARGE MEDICATIONS:  1. Lantus insulin 35 units subcutaneous nightly.  2. Sliding scale NovoLog q.a.c. and nightly.  3. Bactrim DS 1 tablet b.i.d. for five more days.   DISCHARGE DISPOSITION:  The patient is being discharged to home in  improved and stable condition.  She was advised to follow up with the  [facility] as scheduled.   CONSULTATIONS:  None.   PROCEDURES PERFORMED:  Chest x-ray on February 11, 2008.  The results  revealed no acute cardiopulmonary findings.   HISTORY OF PRESENT ILLNESS:  The patient is a 25 year old woman with a  past medical history significant for type 1 diabetes mellitus.  She  presented to the emergency department on February 11, 2008 with a chief  complaint of generalized weakness, increased thirst, increased  urination, and a loss of appetite.  When she was evaluated in the  emergency department, her venous glucose was noted to be 450 and her CO2  was 12.  The patient was therefore admitted for further evaluation and  management.  Of note, the patient was discharged from the hospital in  May 2009 for the same.  The patient admitted not following up with [facility]  She also admitted that  she ran out of her insulin 2 weeks ago and did not or could not refill  the insulin prescription because of a lack of finances.   HOSPITAL COURSE:  1. DKA, hyponatremia, and  hypokalemia.  The patient was started on      volume repletion with normal saline.  The Glucommander insulin drip      protocol was started.  The patient's capillary blood glucose and      electrolytes were monitored frequently.  Eventually, her blood      glucose became controlled and the acidosis corrected.  She was      transitioned to Lantus insulin subcutaneous nightly and sliding      scale NovoLog q.a.c. and nightly.  Over the course of the      hospitalization, adjustments were made in the insulin dosing.      Prior to hospital discharge, the patient was receiving 35 units of      Lantus each night and a sliding scale NovoLog q.a.c./nightly      (sensitive scale).  Her capillary blood glucose prior to hospital      discharge was 85.  The patient's serum sodium was 131 and her serum potassium was 4.5 at  the time of the initial hospital assessment.  With IV  fluid volume  repletion, her serum sodium improved.  However, her serum potassium fell  to 3.  The patient was repleted with potassium chloride orally and in  the IV fluids.  As of today, her sodium is within normal limits at 138  and her potassium is within normal limits at 3.7.  Her CO2 today is 23.  Her hemoglobin A1c was assessed and found to be quite uncontrolled at  13.2.  Prior to hospital discharge, the patient was counseled on her new  insulin regimen.  1. RIGHT AXILLARY ADENOPATHY, POSSIBLY SECONDARY TO FOLLICULITIS.  The      patient complained of a lump under her right armpit.  Upon      examination, there was a small nodule without active drainage or      erythema.  There was a little tenderness upon the initial      examination.  The patient was started on Bactrim DM.  She is      currently afebrile and her white blood cell count is within normal      limits.  She will be continued on antibiotic treatment for five      more days.      Elliot Cousin, M.D.  Electronically Signed     DF/MEDQ  D:  02/14/2008   T:  02/14/2008  Job:  [id number]   cc:   Melvern Banker  Fax: [phone]

## 2010-12-10 NOTE — H&P (Signed)
[name], [name]              ACCOUNT NO.:  [id number]   MEDICAL RECORD NO.:  [id number]          PATIENT TYPE:  INP   LOCATION:  1228                         FACILITY:  [facility]   PHYSICIAN:  Mobolaji B. Bakare, M.D.DATE OF BIRTH:  [birth year]   DATE OF ADMISSION:  02/11/2008  DATE OF DISCHARGE:                              HISTORY & PHYSICAL   PRIMARY CARE PHYSICIAN:  Unassigned.   CHIEF COMPLAINT:  Weakness.   HISTORY OF PRESENTING COMPLAINT:  [name] is a 25 year old African  American female with type 1 diabetes mellitus, diagnosed about 8 years  ago.  She ran out of her medications 2 weeks ago.  She tells me she did  not have refill on insulin prescription.  In addition, she has failed to  follow-up with her primary care physician, primarily because of  financial difficulties as well.   Hence, she is presenting with progressive weakness, polyuria, polydipsia  and loss of appetite.  She denies abdominal pain, nausea and vomiting,  diarrhea.  She endorses subjective fever but no chills.  No cough, chest  pain, sore throat, rhinorrhea.  Temperature in the emergency room was  97.1.   She denies dysuria or foul-smelling urine.   The patient was admitted for DKA here in [facility] in May  2009.  Upon discharge she was given a prescription for Lantus and  NovoLog; however, the patient has not followed up with any primary care  physician; although she was set up to follow up at [facility] or Dr.  Carlynn Purl at [facility].  She has not done this so  far.   REVIEW OF SYSTEMS:  As in the HPI.  There is no headaches.  No changes  in her vision.  She complains of a boil on her right armpit, which  occurred a few days after shaving.  It is painful but no discharging  sinus.   PAST MEDICAL HISTORY:  1. Type 1 diabetes mellitus for 1 year.  2. History of DKA.  I believe one in June 2008 and May 2009.   PAST SURGICAL HISTORY:  None.   CURRENT  MEDICATIONS:  The patient is supposed to be on Lantus insulin 24  units nightly and NovoLog sliding scale.   ALLERGIES:  NO KNOWN DRUG ALLERGIES.   FAMILY HISTORY:  Positive for diabetes mellitus in her maternal great-  grandmother.  One brother had asthma in early childhood.   SOCIAL HISTORY:  The patient does not smoke cigarettes nor drink  alcohol.  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   PHYSICAL EXAMINATION:  INITIAL VITALS:  Temperature 97.4, blood pressure  120/82, pulse of 115, respiratory rate of 20, O2 saturations 99%.  On  examination the patient is awake, alert, oriented to time, place and  person.  HEENT:  Normocephalic, atraumatic.  Pupils equal, round and reactive to  light.  Mucous membranes dry.  No oral thrush noted  NECK:  No elevated JVD.  No carotid bruit.  LUNGS:  Clear clinically to auscultation.  CVS:  S1 and S2 regular.  ABDOMEN:  Not distended, soft.  She has 2 umbilical rings.  No palpable  organomegaly.  There is no tenderness.  Bowel sounds present.  EXTREMITIES:  No pedal edema or calf tenderness.  Dorsalis pedis pulses  palpable bilaterally.  CNS:  No focal neurological deficit.   INITIAL LABORATORY DATA:  Sodium 131, potassium 4.5.  Chloride 101, CO2  12, glucose 450, BUN 12, creatinine 1.04, calcium 88.9.  Urine  microscopy unremarkable.  Urinalysis shows specific gravity of 1.045,  glucose greater than 1000, ketones greater than 80; negative for nitrite  and leukocyte esterase.  White cells 5.2, hemoglobin 14.4 hematocrit  43.4, platelets 304, normal differential.  Urine pregnancy test  negative.   CHEST X-RAY:  Shows no acute cardiopulmonary findings.   ASSESSMENT AND PLAN:  1. [name] is a 25 year old African American female with 8-year      history of diabetes mellitus, presenting with DKA, anion gap of 18      and bicarb of 12.  She will be admitted for further stabilization.      She is with insulin therapy,  IV fluids to correct electrolytes and      acidosis.  Please see DKA orders for further information.  Will      offer diabetic education.  Check hemoglobin A1c.  Will again need      to set the patient up with a physician in the community.  The      patient ran out of her medications and could not refill.  2. Right axillary lump:  Folliculitis versus hidradenitis suppurativa.      Will empirically start Bactrim double-strength one p.o. b.i.d. for      1 week.  3. Pseudo hyponatremia.  This will be corrected with correction of DKA      hyperglycemia.  4. Dehydration.  Will give IV fluids for hydration.      Mobolaji B. Corky Downs, M.D.  Electronically Signed     MBB/MEDQ  D:  02/11/2008  T:  02/11/2008  Job:  [id number]

## 2010-12-10 NOTE — H&P (Signed)
[name], [name]              ACCOUNT NO.:  [id number]   MEDICAL RECORD NO.:  [id number]          PATIENT TYPE:  EMS   LOCATION:  ED                           FACILITY:  [facility]   PHYSICIAN:  Marcellus Scott, MD     DATE OF BIRTH:  [birth year]   DATE OF ADMISSION:  01/22/2007  DATE OF DISCHARGE:                              HISTORY & PHYSICAL   PRIMARY MEDICAL DOCTOR:  Dr. Angelique Blonder Perez-Fiery of [facility], [facility].   CHIEF COMPLAINT:  Nausea, vomiting, abdominal pain, diarrhea, dizziness,  dyspnea, weakness.   HISTORY OF PRESENTING ILLNESS:  [name] is a 25 year old pleasant  African-American female patient with history of type 1 diabetes since  age 12 years who was in her usual state of health until approximately  7:30 p.m. last night.  The patient's mother is also by her beside,  providing some details.  At about 7:30 p.m. last night the patient ate  some chicken wings from a fast-food center.  About an hour later the  patient started feeling woozy, abdominal pain, and just did not feel  well.  However, the patient was able to sleep until some time early this  morning when she woke up with nausea, abdominal pain, and vomiting  multiple times.  The patient says she has vomited about five times in  all, containing the food that she had consumed but no associated coffee  grounds or blood.  The patient also had multiple episodes of diarrhea  about five to six times but no blood.  In addition to this, the patient  started feeling dizzy and weak and dyspneic.  The patient says over the  last 24-36 hours she has not checked her blood sugars.  For these  symptoms, the patient presents to the emergency room where her initial  blood sugars checks were in the 390s.  Further evaluation reveals her to  be in diabetic ketoacidosis whereby the patient was provided IV fluid  hydration and started on Glucommander regular insulin drip.  The patient  currently says she is  feeling 90% better with resolution of her nausea,  vomiting, abdominal pain, diarrhea, weakness, dizziness, and dyspnea. No  family or friends with similar complaints. No history of travel or  camping.   PAST MEDICAL HISTORY:  The patient is a type 1 diabetic diagnosed at the  age of 12 years.  She has been on insulin since then.  She has had  episodes of ketoacidosis earlier as a child but none recently.  The  patient's most recent change in the insulin doses were last year.  The  patient claims compliance with her medications and blood sugar checks  and diet and exercise.  The patient says her blood sugars range less  than 200 which she checks it twice daily.  The patient denies any other  past medical history.   PAST SURGICAL OR PSYCHIATRIC HISTORY:  Negative.   ALLERGIES:  No known drug allergies.   MEDICATIONS:  1. Regular insulin 10 units subcutaneously in the morning and evening.  2. NPH insulin 18 units subcutaneously in the  morning and 20 units      subcutaneously in the evening.  3. Sliding scale regular insulin.   FAMILY HISTORY:  The patient's maternal great-grandmother with history  of diabetes and the patient's brother with history of asthma.   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  She is single.  She denies smoking, alcohol  or drug abuse.  LMP 3-4 days ago.   REVIEW OF SYSTEMS:  Over 10 systems reviewed and is noncontributory.   PHYSICAL EXAMINATION:  GENERAL:  [name] is a moderately-built and -  nourished pleasant female patient in no obvious distress.  VITAL SIGNS:  Temperature 97.4 degrees Fahrenheit, blood pressure  118/75, pulse of 115 per minute and regular, respirations 22 per minute,  saturating at 99% on room air.  HEENT:  Nontraumatic, normocephalic.  Pupils equally reactive to light  and accommodation.  Oral cavity with slightly dry mucosa with no  pharyngeal edema or thrush.  Multiple  dental fillings.  NECK:  With no JVD, carotid bruit, lymphadenopathy, or goiter.  Supple.  RESPIRATORY:  Clear to auscultation.  CARDIOVASCULAR:  First and second heart sounds heard. No S3, S4,  murmurs, rubs, gallops, clicks.  ABDOMEN:  Nondistended, nontender, no organomegaly or mass. Bowel sounds  are normally heard.  NEUROLOGIC:  The patient is awake, alert, oriented x3, with no focal  neurologic deficit.  EXTREMITIES:  Without cyanosis, clubbing or edema.  Peripheral pulses  were bilaterally felt.  SKIN:  Without any rashes.   LABORATORY DATA:  Urinalysis with specific gravity 1.030, pH 5.5,  glucose greater than 1000, ketones are greater than 80, protein 30,  negative for features of urinary tract infection.  Urine pregnancy test  negative.  CBC with hemoglobin 16.2, hematocrit 44.7, white blood cells  12.2, platelets 335.  Basic metabolic panel showed sodium of 134,  potassium 5.1, bicarb of 11, glucose of 415, BUN 19, creat 1.16 ,  calcium is 9.8, with an anion gap 20.   ASSESSMENT AND PLAN:  1. Diabetic ketoacidosis in patient with type 1 diabetes, probably      precipitated by acute gastroenteritis/food poisoning.  Will admit      the patient to the hospital.  Will aggressively hydrate the patient      with IV normal saline.  The patient is already on the Glucommander      regular insulin protocol.  Will follow frequent basic metabolic      panels and supplement potassium once her potassium is normal  and      the patient is making adequate urine.  Will also check the      patient's hemoglobin A1c. Once glycemic control is better will      switch to her home insulin regimen.  2. Acute gastroenteritis/food poisoning, which has resolved.  Will      check serum lipase levels.  3. Dehydration which is secondary to problem #1 and #2.  For IV fluid      hydration.  4. The patient is looking for an adult primary medical doctor.  Will      assist with finding one on  discharge.      Marcellus Scott, MD  Electronically Signed     AH/MEDQ  D:  01/22/2007  T:  01/22/2007  Job:  [id number]   cc:   Maia Breslow, M.D.  Fax: [phone]

## 2010-12-10 NOTE — H&P (Signed)
NAMEREMY, DIA              ACCOUNT NO.:  [id number]   MEDICAL RECORD NO.:  [id number]         PATIENT TYPE:  LINP   LOCATION:                               FACILITY:  [facility]   PHYSICIAN:  Beckey Rutter, MD  DATE OF BIRTH:  1986-06-07   DATE OF ADMISSION:  11/28/2007  DATE OF DISCHARGE:                              HISTORY & PHYSICAL   PRIMARY CARE PHYSICIAN:  Unassigned.   HISTORY OF PRESENT ILLNESS:  A 24 year old pleasant African-American  female with past medical history significant for type 1 diabetes and  previous hospitalization for DKA who presented today because of nausea  and vomiting.  The patient was in her usual state of health up to 5 days  ago when she had tooth extraction.  The patient was complaining of pain  on and off, and she was taking oxycodone for the pain.  This morning the  patient started to have nausea and vomiting, and she felt fever.  The  patient denied diarrhea, and she could not give a number for her  temperature when she felt febrile.  No chills.  The patient stated she  was taking her insulin regularly. Currently she is taking 16 units of  NPH in the morning together with 10 units of regular insulin and 12  units of NPH at night with 10 units of regular.   PAST MEDICAL HISTORY:  Significant for:  1. Type 1 diabetes for the last 8 years.  2. Hospitalization previously for DKA in June 2008.   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   FAMILY HISTORY:  Significant for coronary artery disease, cancer,  diabetes and hypertension.   MEDICATION ALLERGIES:  Not known to have medication allergy.   MEDICATIONS:  1. NPH regular 16 in the morning and 12 units at night.  2 . Insulin Humulin with 10 units in the morning and 10 at night.   REVIEW OF SYSTEMS:  The patient denied cough, though she admits to  feeling some shortness of breath in the morning together with the nausea  and  vomiting.   PHYSICAL EXAMINATION:  VITAL SIGNS:  Temperature 98.0, blood pressure  114/72, pulse 76, respiratory rate 18.  HEENT:  Head  Atraumatic, normocephalic.  Eyes:  PERRLA.  Mouth moist.  No ulcer. The patient has still cavity of molar extractions.  NECK:  Supple.  No JVD.  PRECORDIUM:  First and second heart sound audible.  No added sounds.  LUNGS:  Bilateral fair air entry.  ABDOMEN:  Soft, nontender.  Bowel sounds present.  EXTREMITIES:  No lower extremity edema.  GENITOURINARY:  No CVA tenderness.   LABORATORY DATA AND X-RAYS:  ABG was showing pH of 7.308, pCO2 29.6, pO2  103.0. Lipase is 18.  Sodium is 134, potassium 4.5, chloride 103, CO2-  bicarb 17, glucose 350, BUN 9, creatinine 0.85, alkaline phosphatase 59,  AST 19, ALT 15. CBG is showing white blood count of 11.1, hemoglobin  14.0, hematocrit 42.8, platelet count 296.  Neutrophil percentage is  88%.  Microscopic urine is  showing bacteria rare by high-power field.  Urinalysis showing yellow clear urine, positive for ketones of more than  80.  Urine glucose is more than 1000, and blood is large.   Chest x-ray showing no active lung disease as per radiologist.   ASSESSMENT:  This is 25 year old with mild acidosis.  The pH is 7.3,  though the anion gap is not huge, calculated only 14.  I am not sure why  the patient has this diabetic ketoacidosis.  She mentioned nausea and  vomiting and some diarrhea which could be the cause of the DKA, but it  could be as well a manifestation of DKA.  The patient was taking her  regular insulin, and there is no change in the storage of the insulin.  Her urinalysis is positive for blood, but she is menstruating currently.   PLAN:  1. The patient will be admitted to step-down or ICU for continuous      insulin infusion Glucomander.  2. The patient will have frequent metabolic panels to assess her      electrolyte status and replete potassium as needed.  3. We will check her  hemoglobin A1c, and we will request diabetic      educator.  4. I will also request a dietician consultation.  5. I will start the patient on Lovenox for DVT prophylaxis.  6. For GI prophylaxis, I will start the patient on intravenous      Protonix to be changed to p.o. when the patient is stable.      Beckey Rutter, MD  Electronically Signed     EME/MEDQ  D:  11/28/2007  T:  11/28/2007  Job:  [id number]

## 2010-12-10 NOTE — Discharge Summary (Signed)
[name], [name]              ACCOUNT NO.:  [id number]   MEDICAL RECORD NO.:  [id number]          PATIENT TYPE:  INP   LOCATION:  1340                         FACILITY[facility]   PHYSICIAN:  Herbie Saxon, MDDATE OF BIRTH:  03-24-1986   DATE OF ADMISSION:  11/28/2007  DATE OF DISCHARGE:  12/01/2007                               DISCHARGE SUMMARY   DISCHARGE DIAGNOSES:  1. Diabetic ketoacidosis, resolved.  2. Insulin-dependent type 1 diabetes, improved control.  3. Hypokalemia, repleted.  4. Hyponatremia, improved.   HOSPITAL COURSE:  This 25 year old African-American female with eight-  year history of type 1 diabetes was admitted with nausea, vomiting,  abdominal discomfort.  Glucose at presentation was 350 and bicarbonate  of 17.  Patient was started on IV insulin infusion, IV fluid normal  saline hydration, potassium supplementation and she has improved  clinically, presently asymptomatic and she is to be discharged home.   DISCHARGE CONDITION:  Stable.   DIET:  1800 calorie ADA.   ACTIVITY:  No restrictions.   FOLLOWUP:  With primary care physician at [facility] or Dr. Carlynn Purl at  the [facility] in three to five days.  Dr. Carlynn Purl is  actually a pediatrician.  Patient is to establish an adult physician.  She has been counseled to follow up at the [facility] to  establish a primary care physician and that primary care physician is to  arrange endocrinologist followup as necessary.   DISCHARGE MEDICATIONS:  1. Lantus insulin 20 units subcu q.h.s.  2. NovoLog insulin 4 units b.i.d. with meals.  3. Potassium chloride 10 mEq daily for two days.   PHYSICAL EXAM:  On examination today, she is a young lady, not in acute  distress.  Temperature is 98, pulse 75, respiratory rate is 20, blood  pressure is 117/75.  Pupils were equal, reactive to light and  accommodation.  Head is atraumatic, normocephalic.  Mucous membranes are  moist.   Oropharynx and nasopharynx are clear.  The neck is supple.  There is no elevated JVD.  Chest is clinically clear.  Heart sounds 1  and 2, regular rate and rhythm.  No thrills, no murmurs, no rubs or  gallops.  Abdomen is soft, nontender, no organomegaly.  Bowel sounds are  normoactive.  Inguinal orifices are patent.  She is alert and oriented  to time, place and person.  Peripheral pulses are present, no pedal  edema.  Cranial nerves II through XII intact.  Sensory system normal.  Power is 5, all limbs.  No skin rash.  No joint swelling.   LABORATORY DATA:  On Nov 30, 2007, shows a sodium of 136, potassium 3.4,  chloride 105, bicarbonate 23, glucose 99, BUN 6, creatinine 0.5.  WBC 5,  hematocrit 41, platelet count is 307.  Hemoglobin A1c 12.9.  Patient is  to have a repeat potassium checked and will be supplemented with 20 mEq  if less than 3.5.  She is going to be discharged home on potassium  supplementation for two more days.   Treatment and discharge plan explained to her.  She verbalizes  understanding.   Discharge took greater than 30 minutes.      Herbie Saxon, MD  Electronically Signed     MIO/MEDQ  D:  12/01/2007  T:  12/01/2007  Job:  [id number]

## 2010-12-10 NOTE — Discharge Summary (Signed)
[name], [name]              ACCOUNT NO.:  [id number]   MEDICAL RECORD NO.:  [id number]          PATIENT TYPE:  INP   LOCATION:  1422                         FACILITY:  [facility]   PHYSICIAN:  Wilson Singer, M.D.DATE OF BIRTH:  05-28-1986   DATE OF ADMISSION:  01/22/2007  DATE OF DISCHARGE:  01/25/2007                               DISCHARGE SUMMARY   FINAL DISCHARGE DIAGNOSES:  1. Diabetic ketoacidosis.  2. Gastroenteritis.   MEDICATIONS ON DISCHARGE:  NPH insulin and regular insulin as dosages  previously out of the hospital and with small adjustments.   CONDITION ON DISCHARGE:  Stable.   HISTORY:  This 25 year old lady was admitted with diabetic ketoacidosis  and triggered most likely by acute gastroenteritis.  Please see initial  history and physical examination by Dr. Marcellus Scott.   HOSPITAL PROGRESS:  The patient was admitted and given intravenous  fluids and intravenous insulin.  She did well with this.  She is now  tolerating a diet, and her sugars are reasonably well controlled now.   PHYSICAL EXAMINATION:  VITAL SIGNS:  Today, temperature is 98, blood  pressure 102/69, pulse 82, saturation 100% on room air.  LUNGS:  Lung fields are clear.  There are no physical findings or  abnormalities.   LABORATORY DATA:  Investigation showed a bicarbonate of 23 yesterday.  Sodium 139, potassium 3.4, BUN 6, creatinine 0.45.  Potassium had been  given yesterday.   DISPOSITION:  I have told the patient to reduce her morning NPH and  evening regular insulin to make sure she does not get hypoglycemic  events in the evening that she normally does.  I have also made sure she  understands the importance of controlling diabetes at the present time,  as her hemoglobin A1c measured in the hospital was over 13%.  She will  follow up with her primary care physician within 1-2 weeks.      Wilson Singer, M.D.  Electronically Signed     NCG/MEDQ  D:  01/25/2007  T:   01/25/2007  Job:  [id number]   cc:   Maia Breslow, M.D.  Fax: [phone]

## 2011-04-25 LAB — DIFFERENTIAL
Basophils Absolute: 0
Basophils Absolute: 0
Basophils Relative: 0
Basophils Relative: 1
Eosinophils Absolute: 0.1
Eosinophils Absolute: 0.1
Eosinophils Relative: 2
Eosinophils Relative: 3
Lymphocytes Relative: 26
Lymphocytes Relative: 47 — ABNORMAL HIGH
Lymphs Abs: 1.3
Lymphs Abs: 2.4
Monocytes Absolute: 0.4
Monocytes Absolute: 0.5
Monocytes Relative: 10
Monocytes Relative: 8
Neutro Abs: 2
Neutro Abs: 3.3
Neutrophils Relative %: 40 — ABNORMAL LOW
Neutrophils Relative %: 64

## 2011-04-25 LAB — CBC
HCT: 37.5
HCT: 37.7
HCT: 43.4
Hemoglobin: 12.4
Hemoglobin: 12.4
Hemoglobin: 14.4
MCHC: 33
MCHC: 33.1
MCHC: 33.2
MCV: 84.1
MCV: 84.2
MCV: 84.3
Platelets: 259
Platelets: 263
Platelets: 304
RBC: 4.46
RBC: 4.47
RBC: 5.15 — ABNORMAL HIGH
RDW: 14.2
RDW: 14.4
RDW: 14.5
WBC: 4.7
WBC: 5.1
WBC: 5.2

## 2011-04-25 LAB — GLUCOSE, CAPILLARY
Glucose-Capillary: 102 — ABNORMAL HIGH
Glucose-Capillary: 113 — ABNORMAL HIGH
Glucose-Capillary: 115 — ABNORMAL HIGH
Glucose-Capillary: 120 — ABNORMAL HIGH
Glucose-Capillary: 128 — ABNORMAL HIGH
Glucose-Capillary: 128 — ABNORMAL HIGH
Glucose-Capillary: 130 — ABNORMAL HIGH
Glucose-Capillary: 157 — ABNORMAL HIGH
Glucose-Capillary: 171 — ABNORMAL HIGH
Glucose-Capillary: 176 — ABNORMAL HIGH
Glucose-Capillary: 192 — ABNORMAL HIGH
Glucose-Capillary: 275 — ABNORMAL HIGH
Glucose-Capillary: 293 — ABNORMAL HIGH
Glucose-Capillary: 300 — ABNORMAL HIGH
Glucose-Capillary: 336 — ABNORMAL HIGH
Glucose-Capillary: 342 — ABNORMAL HIGH
Glucose-Capillary: 373 — ABNORMAL HIGH
Glucose-Capillary: 386 — ABNORMAL HIGH
Glucose-Capillary: 85

## 2011-04-25 LAB — URINE MICROSCOPIC-ADD ON

## 2011-04-25 LAB — BASIC METABOLIC PANEL
BUN: 12
BUN: 12
BUN: 7
BUN: 8
BUN: 9
CO2: 12 — ABNORMAL LOW
CO2: 16 — ABNORMAL LOW
CO2: 18 — ABNORMAL LOW
CO2: 18 — ABNORMAL LOW
CO2: 23
Calcium: 8.1 — ABNORMAL LOW
Calcium: 8.1 — ABNORMAL LOW
Calcium: 8.2 — ABNORMAL LOW
Calcium: 8.9
Calcium: 9
Chloride: 101
Chloride: 109
Chloride: 110
Chloride: 111
Chloride: 112
Creatinine, Ser: 0.46
Creatinine, Ser: 0.48
Creatinine, Ser: 0.55
Creatinine, Ser: 0.55
Creatinine, Ser: 1.04
GFR calc Af Amer: >60
GFR calc Af Amer: >60
GFR calc Af Amer: >60
GFR calc Af Amer: >60
GFR calc Af Amer: >60
GFR calc non Af Amer: >60
GFR calc non Af Amer: >60
GFR calc non Af Amer: >60
GFR calc non Af Amer: >60
GFR calc non Af Amer: >60
Glucose, Bld: 130 — ABNORMAL HIGH
Glucose, Bld: 131 — ABNORMAL HIGH
Glucose, Bld: 142 — ABNORMAL HIGH
Glucose, Bld: 151 — ABNORMAL HIGH
Glucose, Bld: 450 — ABNORMAL HIGH
Potassium: 3 — ABNORMAL LOW
Potassium: 3.3 — ABNORMAL LOW
Potassium: 3.5
Potassium: 3.7
Potassium: 4.5
Sodium: 131 — ABNORMAL LOW
Sodium: 135
Sodium: 136
Sodium: 136
Sodium: 138

## 2011-04-25 LAB — HEMOGLOBIN A1C
Hgb A1c MFr Bld: 13.2 — ABNORMAL HIGH
Mean Plasma Glucose: 393

## 2011-04-25 LAB — URINALYSIS, ROUTINE W REFLEX MICROSCOPIC
Bilirubin Urine: NEGATIVE
Glucose, UA: >1000 — AB
Hgb urine dipstick: NEGATIVE
Ketones, ur: >80 — AB
Leukocytes, UA: NEGATIVE
Nitrite: NEGATIVE
Protein, ur: NEGATIVE
Specific Gravity, Urine: 1.043 — ABNORMAL HIGH
Urobilinogen, UA: 0.2
pH: 5.5

## 2011-04-25 LAB — COMPREHENSIVE METABOLIC PANEL
ALT: 12
AST: 12
Albumin: 3 — ABNORMAL LOW
Alkaline Phosphatase: 45
BUN: 8
CO2: 21
Calcium: 8.6
Chloride: 109
Creatinine, Ser: 0.47
GFR calc Af Amer: >60
GFR calc non Af Amer: >60
Glucose, Bld: 174 — ABNORMAL HIGH
Potassium: 4
Sodium: 137
Total Bilirubin: 0.8
Total Protein: 5.9 — ABNORMAL LOW

## 2011-04-25 LAB — PREGNANCY, URINE: Preg Test, Ur: NEGATIVE

## 2011-04-25 LAB — PHOSPHORUS: Phosphorus: 2 — ABNORMAL LOW

## 2011-04-25 LAB — MAGNESIUM: Magnesium: 1.7

## 2011-05-14 LAB — COMPREHENSIVE METABOLIC PANEL
ALT: 11
AST: 16
Albumin: 2.7 — ABNORMAL LOW
Alkaline Phosphatase: 35 — ABNORMAL LOW
BUN: 8
CO2: 18 — ABNORMAL LOW
Calcium: 8 — ABNORMAL LOW
Chloride: 113 — ABNORMAL HIGH
Creatinine, Ser: 0.62
GFR calc Af Amer: >60
GFR calc non Af Amer: >60
Glucose, Bld: 97
Potassium: 2.8 — ABNORMAL LOW
Sodium: 136
Total Bilirubin: 0.7
Total Protein: 5.2 — ABNORMAL LOW

## 2011-05-14 LAB — BASIC METABOLIC PANEL
BUN: 11
BUN: 16
BUN: 19
BUN: 6
BUN: 7
BUN: 7
BUN: 8
CO2: 11 — ABNORMAL LOW
CO2: 12 — ABNORMAL LOW
CO2: 13 — ABNORMAL LOW
CO2: 14 — ABNORMAL LOW
CO2: 16 — ABNORMAL LOW
CO2: 18 — ABNORMAL LOW
CO2: 23
Calcium: 7.8 — ABNORMAL LOW
Calcium: 8.1 — ABNORMAL LOW
Calcium: 8.2 — ABNORMAL LOW
Calcium: 8.3 — ABNORMAL LOW
Calcium: 8.3 — ABNORMAL LOW
Calcium: 9
Calcium: 9.8
Chloride: 103
Chloride: 109
Chloride: 109
Chloride: 111
Chloride: 111
Chloride: 111
Chloride: 112
Creatinine, Ser: 0.45
Creatinine, Ser: 0.56
Creatinine, Ser: 0.56
Creatinine, Ser: 0.68
Creatinine, Ser: 0.76
Creatinine, Ser: 0.93
Creatinine, Ser: 1.16
GFR calc Af Amer: >60
GFR calc Af Amer: >60
GFR calc Af Amer: >60
GFR calc Af Amer: >60
GFR calc Af Amer: >60
GFR calc Af Amer: >60
GFR calc Af Amer: >60
GFR calc non Af Amer: 60 — ABNORMAL LOW
GFR calc non Af Amer: >60
GFR calc non Af Amer: >60
GFR calc non Af Amer: >60
GFR calc non Af Amer: >60
GFR calc non Af Amer: >60
GFR calc non Af Amer: >60
Glucose, Bld: 161 — ABNORMAL HIGH
Glucose, Bld: 189 — ABNORMAL HIGH
Glucose, Bld: 203 — ABNORMAL HIGH
Glucose, Bld: 240 — ABNORMAL HIGH
Glucose, Bld: 415 — ABNORMAL HIGH
Glucose, Bld: 45 — ABNORMAL LOW
Glucose, Bld: 81
Potassium: 3.4 — ABNORMAL LOW
Potassium: 3.7
Potassium: 3.8
Potassium: 3.9
Potassium: 4.1
Potassium: 4.6
Potassium: 5.1
Sodium: 133 — ABNORMAL LOW
Sodium: 133 — ABNORMAL LOW
Sodium: 134 — ABNORMAL LOW
Sodium: 134 — ABNORMAL LOW
Sodium: 135
Sodium: 137
Sodium: 139

## 2011-05-14 LAB — CBC
HCT: 34.9 — ABNORMAL LOW
HCT: 35.5 — ABNORMAL LOW
HCT: 44.7
Hemoglobin: 11.8 — ABNORMAL LOW
Hemoglobin: 11.9 — ABNORMAL LOW
Hemoglobin: 15.2 — ABNORMAL HIGH
MCHC: 33.4
MCHC: 33.8
MCHC: 34
MCV: 84.7
MCV: 85.5
MCV: 86
Platelets: 260
Platelets: 274
Platelets: 335
RBC: 4.06
RBC: 4.15
RBC: 5.27 — ABNORMAL HIGH
RDW: 13.1
RDW: 13.3
RDW: 13.4
WBC: 12.2 — ABNORMAL HIGH
WBC: 5.6
WBC: 7

## 2011-05-14 LAB — URINALYSIS, ROUTINE W REFLEX MICROSCOPIC
Bilirubin Urine: NEGATIVE
Glucose, UA: >1000 — AB
Hgb urine dipstick: NEGATIVE
Ketones, ur: >80 — AB
Leukocytes, UA: NEGATIVE
Nitrite: NEGATIVE
Protein, ur: 30 — AB
Specific Gravity, Urine: 1.03
Urobilinogen, UA: 0.2
pH: 5.5

## 2011-05-14 LAB — DIFFERENTIAL
Basophils Absolute: 0
Basophils Relative: 0
Eosinophils Absolute: 0
Eosinophils Relative: 0
Lymphocytes Relative: 10 — ABNORMAL LOW
Lymphs Abs: 1.2
Monocytes Absolute: 0.1 — ABNORMAL LOW
Monocytes Relative: 1 — ABNORMAL LOW
Neutro Abs: 10.9 — ABNORMAL HIGH
Neutrophils Relative %: 89 — ABNORMAL HIGH

## 2011-05-14 LAB — MAGNESIUM: Magnesium: 1.5

## 2011-05-14 LAB — POCT PREGNANCY, URINE
Operator id: 29026
Preg Test, Ur: NEGATIVE

## 2011-05-14 LAB — HEMOGLOBIN A1C: Hgb A1c MFr Bld: 13.7 — ABNORMAL HIGH

## 2011-05-14 LAB — LIPID PANEL
Cholesterol: 131
HDL: 47
LDL Cholesterol: 71
Total CHOL/HDL Ratio: 2.8
Triglycerides: 66
VLDL: 13

## 2011-05-14 LAB — URINE MICROSCOPIC-ADD ON

## 2011-05-14 LAB — LIPASE, BLOOD: Lipase: 16

## 2012-07-11 ENCOUNTER — Inpatient Hospital Stay (HOSPITAL_COMMUNITY)
Admission: EM | Admit: 2012-07-11 | Discharge: 2012-07-14 | DRG: 295 | Disposition: A | Discharge status: Home / Self Care | Payer: BC Managed Care – PPO | Attending: Internal Medicine | Admitting: Internal Medicine

## 2012-07-11 ENCOUNTER — Encounter (HOSPITAL_COMMUNITY): Payer: Self-pay | Admitting: *Deleted

## 2012-07-11 DIAGNOSIS — I498 Other specified cardiac arrhythmias: Secondary | ICD-10-CM | POA: Diagnosis present

## 2012-07-11 DIAGNOSIS — B3749 Other urogenital candidiasis: Secondary | ICD-10-CM | POA: Diagnosis present

## 2012-07-11 DIAGNOSIS — Z794 Long term (current) use of insulin: Secondary | ICD-10-CM

## 2012-07-11 DIAGNOSIS — E8729 Other acidosis: Secondary | ICD-10-CM | POA: Diagnosis present

## 2012-07-11 DIAGNOSIS — E1010 Type 1 diabetes mellitus with ketoacidosis without coma: Principal | ICD-10-CM | POA: Diagnosis present

## 2012-07-11 DIAGNOSIS — R739 Hyperglycemia, unspecified: Secondary | ICD-10-CM

## 2012-07-11 DIAGNOSIS — E1110 Type 2 diabetes mellitus with ketoacidosis without coma: Secondary | ICD-10-CM

## 2012-07-11 DIAGNOSIS — E872 Acidosis, unspecified: Secondary | ICD-10-CM

## 2012-07-11 DIAGNOSIS — R000 Tachycardia, unspecified: Secondary | ICD-10-CM

## 2012-07-11 DIAGNOSIS — D72829 Elevated white blood cell count, unspecified: Secondary | ICD-10-CM

## 2012-07-11 DIAGNOSIS — E875 Hyperkalemia: Secondary | ICD-10-CM

## 2012-07-11 DIAGNOSIS — J40 Bronchitis, not specified as acute or chronic: Secondary | ICD-10-CM | POA: Diagnosis present

## 2012-07-11 HISTORY — DX: Type 2 diabetes mellitus without complications: E11.9

## 2012-07-11 LAB — TSH: TSH: 0.425 u[IU]/mL (ref 0.350–4.500)

## 2012-07-11 LAB — BASIC METABOLIC PANEL
BUN: 21 mg/dL (ref 6–23)
BUN: 20 mg/dL (ref 6–23)
BUN: 16 mg/dL (ref 6–23)
BUN: 16 mg/dL (ref 6–23)
CO2: 8 mEq/L — CL (ref 19–32)
CO2: <7 mEq/L — CL (ref 19–32)
CO2: 10 mEq/L — CL (ref 19–32)
CO2: 11 mEq/L — ABNORMAL LOW (ref 19–32)
Calcium: 8.6 mg/dL (ref 8.4–10.5)
Calcium: 8.7 mg/dL (ref 8.4–10.5)
Calcium: 8.7 mg/dL (ref 8.4–10.5)
Calcium: 8.7 mg/dL (ref 8.4–10.5)
Chloride: 102 mEq/L (ref 96–112)
Chloride: 105 mEq/L (ref 96–112)
Chloride: 102 mEq/L (ref 96–112)
Chloride: 104 mEq/L (ref 96–112)
Creatinine, Ser: 0.73 mg/dL (ref 0.50–1.10)
Creatinine, Ser: 0.7 mg/dL (ref 0.50–1.10)
Creatinine, Ser: 0.71 mg/dL (ref 0.50–1.10)
Creatinine, Ser: 0.7 mg/dL (ref 0.50–1.10)
GFR calc Af Amer: >90 mL/min (ref >90)
GFR calc Af Amer: >90 mL/min (ref >90)
GFR calc Af Amer: >90 mL/min (ref >90)
GFR calc Af Amer: >90 mL/min (ref >90)
GFR calc non Af Amer: >90 mL/min (ref >90)
GFR calc non Af Amer: >90 mL/min (ref >90)
GFR calc non Af Amer: >90 mL/min (ref >90)
GFR calc non Af Amer: >90 mL/min (ref >90)
Glucose, Bld: 269 mg/dL — ABNORMAL HIGH (ref 70–99)
Glucose, Bld: 225 mg/dL — ABNORMAL HIGH (ref 70–99)
Glucose, Bld: 153 mg/dL — ABNORMAL HIGH (ref 70–99)
Glucose, Bld: 142 mg/dL — ABNORMAL HIGH (ref 70–99)
Potassium: 4.9 mEq/L (ref 3.5–5.1)
Potassium: 5.1 mEq/L (ref 3.5–5.1)
Potassium: 4.5 mEq/L (ref 3.5–5.1)
Potassium: 4.5 mEq/L (ref 3.5–5.1)
Sodium: 133 mEq/L — ABNORMAL LOW (ref 135–145)
Sodium: 135 mEq/L (ref 135–145)
Sodium: 131 mEq/L — ABNORMAL LOW (ref 135–145)
Sodium: 132 mEq/L — ABNORMAL LOW (ref 135–145)

## 2012-07-11 LAB — URINALYSIS, MICROSCOPIC ONLY
Bilirubin Urine: NEGATIVE
Glucose, UA: >1000 mg/dL — AB
Hgb urine dipstick: NEGATIVE
Ketones, ur: >80 mg/dL — AB
Leukocytes, UA: NEGATIVE
Nitrite: NEGATIVE
Protein, ur: NEGATIVE mg/dL
Specific Gravity, Urine: 1.028 (ref 1.005–1.030)
Urobilinogen, UA: 0.2 mg/dL (ref 0.0–1.0)
pH: 5.5 (ref 5.0–8.0)

## 2012-07-11 LAB — CBC WITH DIFFERENTIAL/PLATELET
Basophils Absolute: 0 10*3/uL (ref 0.0–0.1)
Basophils Relative: 0 % (ref 0–1)
Eosinophils Absolute: 0 10*3/uL (ref 0.0–0.7)
Eosinophils Relative: 0 % (ref 0–5)
HCT: 39.3 % (ref 36.0–46.0)
Hemoglobin: 12.9 g/dL (ref 12.0–15.0)
Lymphocytes Relative: 5 % — ABNORMAL LOW (ref 12–46)
Lymphs Abs: 0.7 10*3/uL (ref 0.7–4.0)
MCH: 27.2 pg (ref 26.0–34.0)
MCHC: 32.8 g/dL (ref 30.0–36.0)
MCV: 82.9 fL (ref 78.0–100.0)
Monocytes Absolute: 0.7 10*3/uL (ref 0.1–1.0)
Monocytes Relative: 5 % (ref 3–12)
Neutro Abs: 12.8 10*3/uL — ABNORMAL HIGH (ref 1.7–7.7)
Neutrophils Relative %: 90 % — ABNORMAL HIGH (ref 43–77)
Platelets: 375 10*3/uL (ref 150–400)
RBC: 4.74 MIL/uL (ref 3.87–5.11)
RDW: 14 % (ref 11.5–15.5)
WBC: 14.2 10*3/uL — ABNORMAL HIGH (ref 4.0–10.5)

## 2012-07-11 LAB — BLOOD GAS, VENOUS
Acid-base deficit: 19 mmol/L — ABNORMAL HIGH (ref 0.0–2.0)
Bicarbonate: 9.3 mEq/L — ABNORMAL LOW (ref 20.0–24.0)
Drawn by: 295031
FIO2: 0.21 %
O2 Saturation: 62.6 %
Patient temperature: 98.6
TCO2: 9 mmol/L (ref 0–100)
pCO2, Ven: 29.7 mmHg — ABNORMAL LOW (ref 45.0–50.0)
pH, Ven: 7.123 — CL (ref 7.250–7.300)
pO2, Ven: 41.4 mmHg (ref 30.0–45.0)

## 2012-07-11 LAB — CBC
HCT: 37.2 % (ref 36.0–46.0)
Hemoglobin: 11.6 g/dL — ABNORMAL LOW (ref 12.0–15.0)
MCH: 26.1 pg (ref 26.0–34.0)
MCHC: 31.2 g/dL (ref 30.0–36.0)
MCV: 83.6 fL (ref 78.0–100.0)
Platelets: 370 10*3/uL (ref 150–400)
RBC: 4.45 MIL/uL (ref 3.87–5.11)
RDW: 13.9 % (ref 11.5–15.5)
WBC: 16.6 10*3/uL — ABNORMAL HIGH (ref 4.0–10.5)

## 2012-07-11 LAB — COMPREHENSIVE METABOLIC PANEL
ALT: 29 U/L (ref 0–35)
AST: 41 U/L — ABNORMAL HIGH (ref 0–37)
Albumin: 4.3 g/dL (ref 3.5–5.2)
Alkaline Phosphatase: 64 U/L (ref 39–117)
BUN: 25 mg/dL — ABNORMAL HIGH (ref 6–23)
CO2: 9 mEq/L — CL (ref 19–32)
Calcium: 9.8 mg/dL (ref 8.4–10.5)
Chloride: 95 mEq/L — ABNORMAL LOW (ref 96–112)
Creatinine, Ser: 0.72 mg/dL (ref 0.50–1.10)
GFR calc Af Amer: >90 mL/min (ref >90)
GFR calc non Af Amer: >90 mL/min (ref >90)
Glucose, Bld: 367 mg/dL — ABNORMAL HIGH (ref 70–99)
Potassium: 6.2 mEq/L — ABNORMAL HIGH (ref 3.5–5.1)
Sodium: 131 mEq/L — ABNORMAL LOW (ref 135–145)
Total Bilirubin: 0.6 mg/dL (ref 0.3–1.2)
Total Protein: 7.8 g/dL (ref 6.0–8.3)

## 2012-07-11 LAB — GLUCOSE, CAPILLARY
Glucose-Capillary: 339 mg/dL — ABNORMAL HIGH (ref 70–99)
Glucose-Capillary: 305 mg/dL — ABNORMAL HIGH (ref 70–99)
Glucose-Capillary: 261 mg/dL — ABNORMAL HIGH (ref 70–99)
Glucose-Capillary: 271 mg/dL — ABNORMAL HIGH (ref 70–99)
Glucose-Capillary: 205 mg/dL — ABNORMAL HIGH (ref 70–99)
Glucose-Capillary: 161 mg/dL — ABNORMAL HIGH (ref 70–99)
Glucose-Capillary: 152 mg/dL — ABNORMAL HIGH (ref 70–99)
Glucose-Capillary: 130 mg/dL — ABNORMAL HIGH (ref 70–99)
Glucose-Capillary: 86 mg/dL (ref 70–99)

## 2012-07-11 LAB — URINALYSIS, ROUTINE W REFLEX MICROSCOPIC
Bilirubin Urine: NEGATIVE
Glucose, UA: >1000 mg/dL — AB
Hgb urine dipstick: NEGATIVE
Ketones, ur: >80 mg/dL — AB
Leukocytes, UA: NEGATIVE
Nitrite: NEGATIVE
Protein, ur: NEGATIVE mg/dL
Specific Gravity, Urine: 1.021 (ref 1.005–1.030)
Urobilinogen, UA: 0.2 mg/dL (ref 0.0–1.0)
pH: 5 (ref 5.0–8.0)

## 2012-07-11 LAB — URINE MICROSCOPIC-ADD ON

## 2012-07-11 LAB — HEMOGLOBIN A1C
Hgb A1c MFr Bld: 8.7 % — ABNORMAL HIGH (ref <5.7)
Mean Plasma Glucose: 203 mg/dL — ABNORMAL HIGH (ref <117)

## 2012-07-11 LAB — LIPASE, BLOOD: Lipase: 12 U/L (ref 11–59)

## 2012-07-11 LAB — MRSA PCR SCREENING: MRSA by PCR: NEGATIVE

## 2012-07-11 MED ORDER — ONDANSETRON HCL 4 MG/2ML IJ SOLN: 4.0000 mg | Freq: Four times a day (QID) | INTRAMUSCULAR | Status: DC | PRN

## 2012-07-11 MED ORDER — SODIUM CHLORIDE 0.9 % IV BOLUS (SEPSIS)
1000.0000 mL | Freq: Once | INTRAVENOUS | Status: AC
  Administered 2012-07-11: 1000 mL via INTRAVENOUS

## 2012-07-11 MED ORDER — SODIUM CHLORIDE 0.9 % IV SOLN: INTRAVENOUS | Status: AC

## 2012-07-11 MED ORDER — SODIUM CHLORIDE 0.9 % IJ SOLN
3.0000 mL | Freq: Two times a day (BID) | INTRAMUSCULAR | Status: DC
  Administered 2012-07-11: 10 mL via INTRAVENOUS

## 2012-07-11 MED ORDER — ONDANSETRON HCL 4 MG/2ML IJ SOLN
4.0000 mg | Freq: Once | INTRAMUSCULAR | Status: AC
  Administered 2012-07-11: 4 mg via INTRAVENOUS
  Filled 2012-07-11: qty 2

## 2012-07-11 MED ORDER — SODIUM CHLORIDE 0.9 % IV SOLN
1000.0000 mL | INTRAVENOUS | Status: DC
  Administered 2012-07-11 – 2012-07-13 (×2): 1000 mL via INTRAVENOUS

## 2012-07-11 MED ORDER — HYDROCODONE-ACETAMINOPHEN 5-325 MG PO TABS: 1.0000 | ORAL_TABLET | ORAL | Status: DC | PRN

## 2012-07-11 MED ORDER — ACETAMINOPHEN 325 MG PO TABS
650.0000 mg | ORAL_TABLET | Freq: Four times a day (QID) | ORAL | Status: DC | PRN
  Administered 2012-07-11 – 2012-07-13 (×3): 650 mg via ORAL
  Filled 2012-07-11 (×3): qty 2

## 2012-07-11 MED ORDER — SODIUM CHLORIDE 0.9 % IV SOLN
1000.0000 mL | Freq: Once | INTRAVENOUS | Status: AC
  Administered 2012-07-11: 1000 mL via INTRAVENOUS

## 2012-07-11 MED ORDER — ONDANSETRON HCL 4 MG PO TABS: 4.0000 mg | ORAL_TABLET | Freq: Four times a day (QID) | ORAL | Status: DC | PRN

## 2012-07-11 MED ORDER — ACETAMINOPHEN 650 MG RE SUPP: 650.0000 mg | Freq: Four times a day (QID) | RECTAL | Status: DC | PRN

## 2012-07-11 MED ORDER — DEXTROSE-NACL 5-0.45 % IV SOLN
INTRAVENOUS | Status: DC
  Administered 2012-07-11: 18:00:00 via INTRAVENOUS

## 2012-07-11 MED ORDER — DEXTROSE 50 % IV SOLN: 25.0000 mL | INTRAVENOUS | Status: DC | PRN

## 2012-07-11 MED ORDER — AZITHROMYCIN 500 MG PO TABS
500.0000 mg | ORAL_TABLET | Freq: Once | ORAL | Status: AC
  Administered 2012-07-11: 500 mg via ORAL
  Filled 2012-07-11: qty 1

## 2012-07-11 MED ORDER — SODIUM CHLORIDE 0.9 % IV SOLN
INTRAVENOUS | Status: DC
  Administered 2012-07-11: 2.5 [IU]/h via INTRAVENOUS
  Filled 2012-07-11: qty 1

## 2012-07-11 MED ORDER — DEXTROSE 5 % IV SOLN
500.0000 mg | INTRAVENOUS | Status: DC
  Administered 2012-07-12: 500 mg via INTRAVENOUS
  Filled 2012-07-11 (×4): qty 500

## 2012-07-11 MED ORDER — SODIUM CHLORIDE 0.9 % IV SOLN
INTRAVENOUS | Status: DC
  Administered 2012-07-11: 4.2 [IU]/h via INTRAVENOUS
  Administered 2012-07-11: 2 [IU]/h via INTRAVENOUS
  Administered 2012-07-12: 1.1 [IU]/h via INTRAVENOUS
  Filled 2012-07-11 (×2): qty 1

## 2012-07-11 MED ORDER — SODIUM CHLORIDE 0.9 % IV SOLN: INTRAVENOUS | Status: DC

## 2012-07-11 MED ORDER — ONDANSETRON HCL 4 MG/2ML IJ SOLN: 4.0000 mg | Freq: Three times a day (TID) | INTRAMUSCULAR | Status: AC | PRN

## 2012-07-11 NOTE — ED Notes (Signed)
Patient is unable to void at this time 

## 2012-07-11 NOTE — Progress Notes (Signed)
Notified Dr. Elisabeth Pigeon of critical lab results per protocol CO2 of 8 and 6 . No new orders given at this time.  Will continue to monitor patient and notified MD of any changes.

## 2012-07-11 NOTE — ED Notes (Signed)
CBG registered 261 on ED Glucometer.

## 2012-07-11 NOTE — ED Provider Notes (Signed)
Care resumed from [place], [place]. [name] is a 26 y.o. female w hx of T1DM presented to ER c/o nausea and vomiting. Pt reports medication compliance, however she did not take her insulin this morning. PCP is Dr. Haynes Dage at [facility], DM followed by Dr. Renaldo Fiddler.   Pt re-evaluated & on exam: BP 130/68  Pulse 129  Temp 97.5 F (36.4 C) (Oral)  Resp 18  SpO2 100%  LMP 06/25/2012, currently not having any nausea or emesis & is in NAD, heart tachycardic without other aberrancy, lungs CTAB, Chest & abd non-tender, no peripheral edema or calf tenderness.  CMP     Component Value Date/Time   NA 131* 07/11/2012 1145   K 6.2* 07/11/2012 1145   CL 95* 07/11/2012 1145   CO2 9* 07/11/2012 1145   GLUCOSE 367* 07/11/2012 1145   BUN 25* 07/11/2012 1145   CREATININE 0.72 07/11/2012 1145   CALCIUM 9.8 07/11/2012 1145   PROT 7.8 07/11/2012 1145   ALBUMIN 4.3 07/11/2012 1145   AST 41* 07/11/2012 1145   ALT 29 07/11/2012 1145   ALKPHOS 64 07/11/2012 1145   BILITOT 0.6 07/11/2012 1145   GFRNONAA >90 07/11/2012 1145   GFRAA >90 07/11/2012 1145    AG= 27, pt in DKA. Glucose stabilizer & additional IVFs ordered. Will admit patient for continued treatment. Urine results pending. Dr. Rulon Abide notified of critical values and is agreeable with treatment plan. The patient appears reasonably stabilized for admission considering the current resources, flow, and capabilities available in the ED at this time, and I doubt any other [facility] requiring further screening and/or treatment in the ED prior to admission.  Step down, Team 1     [place], [place] 07/11/12 1336

## 2012-07-11 NOTE — ED Notes (Signed)
CRITICAL VALUE ALERT  Critical value received:  CO2-9  Date of notification: 12.15.13  Time of notification:  12:30  Critical value read back:yes  Nurse who received alert:  Caren Macadam  MD notified (1st page):  Dr.Bonk  Time of first page:  12:33  MD notified (2nd page):NA  Time of second page:NA  Responding MD: NA  Time MD responded:NA

## 2012-07-11 NOTE — ED Notes (Signed)
Patient unable to void at this time

## 2012-07-11 NOTE — ED Notes (Signed)
IV team just left - per RN, wait 30 min so fluids can run.  Will collect labs at 11:30.

## 2012-07-11 NOTE — ED Provider Notes (Signed)
History     CSN: [id number]  Arrival date & time 07/11/12  4098   First MD Initiated Contact with Patient 07/11/12 [phone]      Chief Complaint  Patient presents with  . Nausea  . Emesis  . URI    Flu like    (Consider location/radiation/quality/duration/timing/severity/associated sxs/prior treatment) Patient is a 26 y.o. female presenting with vomiting and URI. The history is provided by the patient and a relative.  Emesis  This is a new problem. The current episode started 12 to 24 hours ago. The problem occurs more than 10 times per day. There has been no fever. Associated symptoms include abdominal pain, myalgias and URI. Pertinent negatives include no chills, no diarrhea and no fever.  URI The primary symptoms include abdominal pain, vomiting and myalgias. Primary symptoms do not include fever or rash.  The illness is not associated with chills.    Past Medical History  Diagnosis Date  . Diabetes mellitus without complication     History reviewed. No pertinent past surgical history.  History reviewed. No pertinent family history.  History  Substance Use Topics  . Smoking status: Never Smoker   . Smokeless tobacco: Never Used  . Alcohol Use: Yes    OB History    Grav Para Term Preterm Abortions TAB SAB Ect Mult Living                  Review of Systems  Constitutional: Negative for fever and chills.  HENT: Negative.   Respiratory: Negative.  Negative for shortness of breath.   Cardiovascular: Negative.  Negative for chest pain.  Gastrointestinal: Positive for vomiting and abdominal pain. Negative for diarrhea.  Genitourinary: Negative.  Negative for dysuria.  Musculoskeletal: Positive for myalgias.  Skin: Negative.  Negative for rash.  Neurological: Negative.  Negative for dizziness and syncope.  Psychiatric/Behavioral: Negative.  Negative for confusion.    Allergies  Review of patient's allergies indicates no known allergies.  Home Medications  No  current outpatient prescriptions on file.  BP 137/77  Pulse 117  Temp 97.5 F (36.4 C) (Oral)  Resp 18  SpO2 100%  LMP 06/25/2012  Physical Exam  Constitutional: She is oriented to person, place, and time. She appears well-developed and well-nourished.  HENT:  Head: Normocephalic.  Mouth/Throat: Mucous membranes are dry.  Neck: Normal range of motion. Neck supple.  Cardiovascular: Regular rhythm.  Tachycardia present.   Pulmonary/Chest: Effort normal and breath sounds normal.  Abdominal: Soft. Bowel sounds are normal. She exhibits no distension and no mass. There is no rebound and no guarding.       Abdomen is diffusely tender.  Musculoskeletal: Normal range of motion.  Neurological: She is alert and oriented to person, place, and time.  Skin: Skin is warm and dry. No rash noted.  Psychiatric: She has a normal mood and affect.    ED Course  Procedures (including critical care time)   Labs Reviewed  CBC WITH DIFFERENTIAL  COMPREHENSIVE METABOLIC PANEL  LIPASE, BLOOD  URINALYSIS, MICROSCOPIC ONLY   No results found.   No diagnosis found.    MDM  Patient care transferred to [facility] pending labs, for re-evaluation and disposition.        Rodena Medin, PA-C 07/11/12 [phone]

## 2012-07-11 NOTE — ED Notes (Signed)
Patient unable to void at this time. Will allow full bolus of fluids to run, then attempt to collect.

## 2012-07-11 NOTE — H&P (Addendum)
[facility] History and Physical  [name] JYN:[id number] DOB: 1985-09-22 DOA: 07/11/2012  Referring physician: ER physician PCP: No primary provider on file.   Chief Complaint: nausea and vomiting  HPI:  26 year old female with past medical history of diabetes mellitus type I who presented to ED with complaints of intractable nausea and vomiting started about 1 day prior to this admission associated with occasional diffuse cramp-like abdominal pain. Patient reported her sugar was high when she measured it at home. She also reported she is compliant with home insulin regimen but she decided to come to ED because she was persistently nauseas and was vomiting. She also reported occasional cough and feeling generalized aching and tiredness. Patient reports subjective fever but no measured temperature, no shortness of breath, no lightheadedness, no loss of consciousness. No chest pain, no headaches.  In ED patient was found to be in DKA with AG elevated at 27. Her EKG showed sinus tachycardia, otherwise the rest of the blood work up was within normal limits.  Assessment and Plan:  Principal Problem:  *DKA (diabetic ketoacidoses)  Perhaps exacerbated by upper respiratory tract infection  We will continue insulin drip  DKA protocol order set in place  Continue IV fluids  Continue current CBG and BMP checks per DKA protocol  Active Problems:  Leukocytosis  Perhaps bronchitis  Follow up urinalysis and urine culture results  Follow up blood culture results  Will start azithromycin  Hyperkalemia  Patient is on insulin drip  We will repeat BMP and if not error will correct it  High anion gap metabolic acidosis  Secondary to DKA  Manage as above  Manson Passey [facility] [phone]  Review of Systems:  Constitutional: Negative for fever, chills and malaise/fatigue. Negative for diaphoresis.  HENT: Negative for hearing loss, ear pain, nosebleeds, congestion, sore throat,  neck pain, tinnitus and ear discharge.   Eyes: Negative for blurred vision, double vision, photophobia, pain, discharge and redness.  Respiratory: positive for cough, negative for hemoptysis, sputum production, shortness of breath, wheezing and stridor.   Cardiovascular: Negative for chest pain, palpitations, orthopnea, claudication and leg swelling.  Gastrointestinal: positive for nausea, vomiting and abdominal pain. Negative for heartburn, constipation, blood in stool and melena.  Genitourinary: Negative for dysuria, urgency, frequency, hematuria and flank pain.  Musculoskeletal: Negative for myalgias, back pain, joint pain and falls.  Skin: Negative for itching and rash.  Neurological: Negative for dizziness and weakness. Negative for tingling, tremors, sensory change, speech change, focal weakness, loss of consciousness and headaches.  Endo/Heme/Allergies: Negative for environmental allergies and polydipsia. Does not bruise/bleed easily.  Psychiatric/Behavioral: Negative for suicidal ideas. The patient is not nervous/anxious.      Past Medical History  Diagnosis Date  . Diabetes mellitus without complication    History reviewed. No pertinent past surgical history. Social History:  reports that she has never smoked. She has never used smokeless tobacco. She reports that she drinks alcohol. She reports that she does not use illicit drugs.  No Known Allergies  Family History: htn in mother  Prior to Admission medications   Medication Sig Start Date End Date Taking? Authorizing Provider  insulin lispro (HUMALOG KWIKPEN) 100 UNIT/ML injection Inject 0-4 Units into the skin 3 (three) times daily before meals. Pt uses sliding scale before meals   Yes Historical Provider, MD  insulin NPH (HUMULIN N,NOVOLIN N) 100 UNIT/ML injection Inject 20 Units into the skin 3 (three) times daily. Pt states she buys this over the counter and  she uses 20 units 3 times daily depending on her meal   Yes  Historical Provider, MD   Physical Exam: Filed Vitals:   07/11/12 1330 07/11/12 1400 07/11/12 1430 07/11/12 1530  BP: 121/74 128/69 133/77 130/66  Pulse: 133  133 127  Temp:      TempSrc:      Resp: 18 19 24 21   SpO2: 99%  100% 100%    Physical Exam  Constitutional: Appears in no distress.  HENT: Normocephalic. External right and left ear normal. Oropharynx is clear and moist.  Eyes: Conjunctivae and EOM are normal. PERRLA, no scleral icterus.  Neck: Normal ROM. Neck supple. No JVD. No tracheal deviation. No thyromegaly.  CVS: RRR, S1/S2 +, no murmurs, no gallops, no carotid bruit.  Pulmonary: Effort and breath sounds normal, no stridor, rhonchi, wheezes, rales.  Abdominal: Soft. BS +,  no distension, tenderness, rebound or guarding.  Musculoskeletal: Normal range of motion. No edema and no tenderness.  Lymphadenopathy: No lymphadenopathy noted, cervical, inguinal. Neuro: Alert. Normal reflexes, muscle tone coordination. No cranial nerve deficit. Skin: Skin is warm and dry. No rash noted. Not diaphoretic. No erythema. No pallor.  Psychiatric: Normal mood and affect. Behavior, judgment, thought content normal.   Labs on Admission:  Basic Metabolic Panel:  Lab 07/11/12 4782 07/11/12 1145  NA 133* 131*  K 4.9 6.2*  CL 102 95*  CO2 8* 9*  GLUCOSE 269* 367*  BUN 21 25*  CREATININE 0.73 0.72  CALCIUM 8.6 9.8  MG -- --  PHOS -- --   Liver Function Tests:  Lab 07/11/12 1145  AST 41*  ALT 29  ALKPHOS 64  BILITOT 0.6  PROT 7.8  ALBUMIN 4.3    Lab 07/11/12 1145  LIPASE 12  AMYLASE --   No results found for this basename: AMMONIA:5 in the last 168 hours CBC:  Lab 07/11/12 1509 07/11/12 1145  WBC 16.6* 14.2*  NEUTROABS -- 12.8*  HGB 11.6* 12.9  HCT 37.2 39.3  MCV 83.6 82.9  PLT 370 375   CBG:  Lab 07/11/12 1544 07/11/12 1455 07/11/12 1340 07/11/12 0853  GLUCAP 271* 261* 305* 339*    Radiological Exams on Admission: No results found.  EKG: sinus  tachycardia  Code Status: Full Family Communication: Pt at bedside Disposition Plan: Admit for further evaluation to stepdown unit  Manson Passey, MD  [facility] Pager [phone]  If 7PM-7AM, please contact night-coverage [email or web address] Password [facility] 07/11/2012, 4:17 PM

## 2012-07-11 NOTE — ED Notes (Signed)
Per EMS pt having nausea/vomiting, flu-like symptoms started about 12 hours ago, actively vomiting in triage. BP 138/80, HR 88, CBG 280, pt has not taken insulin this am

## 2012-07-12 DIAGNOSIS — R000 Tachycardia, unspecified: Secondary | ICD-10-CM

## 2012-07-12 LAB — GLUCOSE, CAPILLARY
Glucose-Capillary: 104 mg/dL — ABNORMAL HIGH (ref 70–99)
Glucose-Capillary: 121 mg/dL — ABNORMAL HIGH (ref 70–99)
Glucose-Capillary: 121 mg/dL — ABNORMAL HIGH (ref 70–99)
Glucose-Capillary: 124 mg/dL — ABNORMAL HIGH (ref 70–99)
Glucose-Capillary: 130 mg/dL — ABNORMAL HIGH (ref 70–99)
Glucose-Capillary: 134 mg/dL — ABNORMAL HIGH (ref 70–99)
Glucose-Capillary: 141 mg/dL — ABNORMAL HIGH (ref 70–99)
Glucose-Capillary: 141 mg/dL — ABNORMAL HIGH (ref 70–99)
Glucose-Capillary: 142 mg/dL — ABNORMAL HIGH (ref 70–99)
Glucose-Capillary: 146 mg/dL — ABNORMAL HIGH (ref 70–99)
Glucose-Capillary: 146 mg/dL — ABNORMAL HIGH (ref 70–99)
Glucose-Capillary: 146 mg/dL — ABNORMAL HIGH (ref 70–99)
Glucose-Capillary: 151 mg/dL — ABNORMAL HIGH (ref 70–99)
Glucose-Capillary: 161 mg/dL — ABNORMAL HIGH (ref 70–99)
Glucose-Capillary: 169 mg/dL — ABNORMAL HIGH (ref 70–99)
Glucose-Capillary: 169 mg/dL — ABNORMAL HIGH (ref 70–99)
Glucose-Capillary: 172 mg/dL — ABNORMAL HIGH (ref 70–99)
Glucose-Capillary: 173 mg/dL — ABNORMAL HIGH (ref 70–99)
Glucose-Capillary: 175 mg/dL — ABNORMAL HIGH (ref 70–99)
Glucose-Capillary: 184 mg/dL — ABNORMAL HIGH (ref 70–99)
Glucose-Capillary: 190 mg/dL — ABNORMAL HIGH (ref 70–99)
Glucose-Capillary: 193 mg/dL — ABNORMAL HIGH (ref 70–99)
Glucose-Capillary: 197 mg/dL — ABNORMAL HIGH (ref 70–99)
Glucose-Capillary: 206 mg/dL — ABNORMAL HIGH (ref 70–99)

## 2012-07-12 LAB — COMPREHENSIVE METABOLIC PANEL
ALT: 29 U/L (ref 0–35)
AST: 40 U/L — ABNORMAL HIGH (ref 0–37)
Albumin: 3.2 g/dL — ABNORMAL LOW (ref 3.5–5.2)
Alkaline Phosphatase: 54 U/L (ref 39–117)
BUN: 11 mg/dL (ref 6–23)
CO2: 13 mEq/L — ABNORMAL LOW (ref 19–32)
Calcium: 8.4 mg/dL (ref 8.4–10.5)
Chloride: 103 mEq/L (ref 96–112)
Creatinine, Ser: 0.63 mg/dL (ref 0.50–1.10)
GFR calc Af Amer: >90 mL/min (ref >90)
GFR calc non Af Amer: >90 mL/min (ref >90)
Glucose, Bld: 176 mg/dL — ABNORMAL HIGH (ref 70–99)
Potassium: 3.8 mEq/L (ref 3.5–5.1)
Sodium: 132 mEq/L — ABNORMAL LOW (ref 135–145)
Total Bilirubin: 0.5 mg/dL (ref 0.3–1.2)
Total Protein: 6.2 g/dL (ref 6.0–8.3)

## 2012-07-12 LAB — BASIC METABOLIC PANEL
BUN: 12 mg/dL (ref 6–23)
BUN: 10 mg/dL (ref 6–23)
BUN: 9 mg/dL (ref 6–23)
BUN: 8 mg/dL (ref 6–23)
BUN: 7 mg/dL (ref 6–23)
CO2: 12 mEq/L — ABNORMAL LOW (ref 19–32)
CO2: 15 mEq/L — ABNORMAL LOW (ref 19–32)
CO2: 18 mEq/L — ABNORMAL LOW (ref 19–32)
CO2: 16 mEq/L — ABNORMAL LOW (ref 19–32)
CO2: 16 mEq/L — ABNORMAL LOW (ref 19–32)
Calcium: 8.5 mg/dL (ref 8.4–10.5)
Calcium: 8.4 mg/dL (ref 8.4–10.5)
Calcium: 8.8 mg/dL (ref 8.4–10.5)
Calcium: 8.6 mg/dL (ref 8.4–10.5)
Calcium: 8.8 mg/dL (ref 8.4–10.5)
Chloride: 99 mEq/L (ref 96–112)
Chloride: 106 mEq/L (ref 96–112)
Chloride: 104 mEq/L (ref 96–112)
Chloride: 103 mEq/L (ref 96–112)
Chloride: 100 mEq/L (ref 96–112)
Creatinine, Ser: 0.64 mg/dL (ref 0.50–1.10)
Creatinine, Ser: 0.6 mg/dL (ref 0.50–1.10)
Creatinine, Ser: 0.6 mg/dL (ref 0.50–1.10)
Creatinine, Ser: 0.57 mg/dL (ref 0.50–1.10)
Creatinine, Ser: 0.59 mg/dL (ref 0.50–1.10)
GFR calc Af Amer: >90 mL/min (ref >90)
GFR calc Af Amer: >90 mL/min (ref >90)
GFR calc Af Amer: >90 mL/min (ref >90)
GFR calc Af Amer: >90 mL/min (ref >90)
GFR calc Af Amer: >90 mL/min (ref >90)
GFR calc non Af Amer: >90 mL/min (ref >90)
GFR calc non Af Amer: >90 mL/min (ref >90)
GFR calc non Af Amer: >90 mL/min (ref >90)
GFR calc non Af Amer: >90 mL/min (ref >90)
GFR calc non Af Amer: >90 mL/min (ref >90)
Glucose, Bld: 176 mg/dL — ABNORMAL HIGH (ref 70–99)
Glucose, Bld: 154 mg/dL — ABNORMAL HIGH (ref 70–99)
Glucose, Bld: 132 mg/dL — ABNORMAL HIGH (ref 70–99)
Glucose, Bld: 170 mg/dL — ABNORMAL HIGH (ref 70–99)
Glucose, Bld: 195 mg/dL — ABNORMAL HIGH (ref 70–99)
Potassium: 4.5 mEq/L (ref 3.5–5.1)
Potassium: 4.1 mEq/L (ref 3.5–5.1)
Potassium: 3.7 mEq/L (ref 3.5–5.1)
Potassium: 4.2 mEq/L (ref 3.5–5.1)
Potassium: 4.1 mEq/L (ref 3.5–5.1)
Sodium: 129 mEq/L — ABNORMAL LOW (ref 135–145)
Sodium: 135 mEq/L (ref 135–145)
Sodium: 134 mEq/L — ABNORMAL LOW (ref 135–145)
Sodium: 133 mEq/L — ABNORMAL LOW (ref 135–145)
Sodium: 132 mEq/L — ABNORMAL LOW (ref 135–145)

## 2012-07-12 LAB — INFLUENZA PANEL BY PCR (TYPE A & B)
H1N1 flu by pcr: NOT DETECTED
Influenza A By PCR: NEGATIVE
Influenza B By PCR: NEGATIVE

## 2012-07-12 LAB — CBC
HCT: 33.9 % — ABNORMAL LOW (ref 36.0–46.0)
Hemoglobin: 10.9 g/dL — ABNORMAL LOW (ref 12.0–15.0)
MCH: 26.3 pg (ref 26.0–34.0)
MCHC: 32.2 g/dL (ref 30.0–36.0)
MCV: 81.7 fL (ref 78.0–100.0)
Platelets: 351 10*3/uL (ref 150–400)
RBC: 4.15 MIL/uL (ref 3.87–5.11)
RDW: 14.2 % (ref 11.5–15.5)
WBC: 14.5 10*3/uL — ABNORMAL HIGH (ref 4.0–10.5)

## 2012-07-12 MED ORDER — KCL IN DEXTROSE-NACL 40-5-0.9 MEQ/L-%-% IV SOLN
INTRAVENOUS | Status: DC
  Administered 2012-07-12 (×2): via INTRAVENOUS
  Filled 2012-07-12 (×5): qty 1000

## 2012-07-12 NOTE — Progress Notes (Signed)
[facility] PROGRESS NOTE  CAMARA RENSTROM OZH:[id number] DOB: 12-13-1985 DOA: 07/11/2012 PCP: No primary provider on file.  Brief narrative: 26 year old female with past medical history of diabetes mellitus type I who presented to ED with complaints of intractable nausea and vomiting started about 1 day prior to this admission associated with occasional diffuse cramp-like abdominal pain. Patient is being treated for DKA and still requires insulin drip.  Assessment and Plan:   Principal Problem:  *DKA (diabetic ketoacidoses)  Perhaps exacerbated by upper respiratory tract infection, bronchitis We will continue insulin drip for now as AG still elevated Continue IV fluids  Continue BMP and CBG checks per DKA protocol NPO  Active Problems:  Leukocytosis  Secondary to bronchitis Continue azithromycin Follow up urinalysis and urine culture results  Follow up blood culture results  Follow up flu test Hyperkalemia  Patient is on insulin drip  Resolved  High anion gap metabolic acidosis  Secondary to DKA  Manage as above  Manson Passey  [facility]  [phone]  Antibiotics:  Azithromycin 07/11/2012 00>  If 7PM-7AM, please contact night-coverage [email or web address] Password [email or web address] 07/12/2012, 10:18 AM   LOS: 1 day   HPI/Subjective: No acute overnight events.  Objective: Filed Vitals:   07/12/12 0400 07/12/12 0403 07/12/12 0500 07/12/12 0800  BP:  114/62  111/57  Pulse:    113  Temp: 98.9 F (37.2 C)     TempSrc: Oral     Resp:  18  17  Height:      Weight:   65.772 kg (145 lb)   SpO2:  100%  96%    Intake/Output Summary (Last 24 hours) at 07/12/12 1018 Last data filed at 07/12/12 0800  Gross per 24 hour  Intake 4253.3 ml  Output   1600 ml  Net 2653.3 ml    Exam:   General:  Pt is alert, follows commands appropriately, not in acute distress  Cardiovascular: Regular rate and rhythm, S1/S2, no murmurs, no rubs, no gallops  Respiratory: Clear to auscultation  bilaterally, no wheezing, no crackles, no rhonchi  Abdomen: Soft, non tender, non distended, bowel sounds present, no guarding  Extremities: No edema, pulses DP and PT palpable bilaterally  Neuro: Grossly nonfocal  Data Reviewed: Basic Metabolic Panel:  Lab 07/12/12 1324 07/12/12 0340 07/12/12 0038 07/11/12 2029 07/11/12 1901  NA 135 132* 129* 132* 131*  K 4.1 3.8 4.5 4.5 4.5  CL 106 103 99 104 102  CO2 15* 13* 12* 11* 10*  GLUCOSE 154* 176* 176* 142* 153*  BUN 10 11 12 16 16   CREATININE 0.60 0.63 0.64 0.70 0.71  CALCIUM 8.4 8.4 8.5 8.7 8.7   Liver Function Tests:  Lab 07/12/12 0340 07/11/12 1145  AST 40* 41*  ALT 29 29  ALKPHOS 54 64  BILITOT 0.5 0.6  PROT 6.2 7.8  ALBUMIN 3.2* 4.3    Lab 07/11/12 1145  LIPASE 12  AMYLASE --   CBC:  Lab 07/12/12 0340 07/11/12 1509 07/11/12 1145  WBC 14.5* 16.6* 14.2*  HGB 10.9* 11.6* 12.9  HCT 33.9* 37.2 39.3  MCV 81.7 83.6 82.9  PLT 351 370 375   CBG:  Lab 07/12/12 0858 07/12/12 0505 07/11/12 2311 07/11/12 2208 07/11/12 2103  GLUCAP 197* 121* 130* 86 130*    Recent Results (from the past 240 hour(s))  MRSA PCR SCREENING     Status: Normal   Collection Time   07/11/12  6:11 PM      Component Value Range Status Comment  MRSA by PCR NEGATIVE  NEGATIVE Final      Studies: No results found.  Scheduled Meds:   . sodium chloride   Intravenous STAT  . azithromycin  500 mg Intravenous Q24H  . sodium chloride  3 mL Intravenous Q12H   Continuous Infusions:   . sodium chloride 1,000 mL (07/11/12 1536)  . dextrose 5 % and 0.9 % NaCl with KCl 40 mEq/L 75 mL/hr at 07/12/12 0618  . insulin (NOVOLIN-R) infusion 2.4 mL/hr at 07/11/12 2000

## 2012-07-12 NOTE — ED Provider Notes (Signed)
Medical screening examination/treatment/procedure(s) were performed by non-physician practitioner and as supervising physician I was immediately available for consultation/collaboration.  Jones Skene, M.D.     Jones Skene, MD 07/12/12 1530

## 2012-07-12 NOTE — Progress Notes (Signed)
[id number]/VHQION Earlene Plater, RN, BSN, CCM: CHART REVIEWED AND UPDATED.  Next chart review due on [id number]. NO DISCHARGE NEEDS PRESENT AT THIS TIME. CASE MANAGEMENT [phone]

## 2012-07-12 NOTE — Progress Notes (Signed)
Patient admitted with DKA and flu-like symptoms. Temp 100.2. Patient placed on droplet precautions. Flu PCR sent.

## 2012-07-12 NOTE — Progress Notes (Signed)
IV site has infiltrated ; 2 unsuccessful attempts at restarting; IV team paged. Insulin drip is currently off due to lack of access. [name]

## 2012-07-12 NOTE — ED Provider Notes (Signed)
Medical screening examination/treatment/procedure(s) were performed by non-physician practitioner and as supervising physician I was immediately available for consultation/collaboration.  John-Adam Sussie Minor, M.D.     John-Adam Wilma Wuthrich, MD 07/12/12 1531 

## 2012-07-12 NOTE — Progress Notes (Signed)
Inpatient Diabetes Program Recommendations  AACE/ADA: New Consensus Statement on Inpatient Glycemic Control (2013)  Target Ranges:  Prepandial:   less than 140 mg/dL      Peak postprandial:   less than 180 mg/dL (1-2 hours)      Critically ill patients:  140 - 180 mg/dL   Reason for Assessment:  DKA admission  26 year old female with past medical history of diabetes mellitus type I who presented to ED with complaints of intractable nausea and vomiting started about 1 day prior to this admission associated with occasional diffuse cramp-like abdominal pain. Patient is being treated for DKA and still requires insulin drip.  Results for [name], [name] (MRN [id number]) as of 07/12/2012 16:46  Ref. Range 07/12/2012 16:03  Sodium Latest Range: 135-145 mEq/L 133 (L)  Potassium Latest Range: 3.5-5.1 mEq/L 4.2  Chloride Latest Range: 96-112 mEq/L 103  CO2 Latest Range: 19-32 mEq/L 16 (L)  BUN Latest Range: 6-23 mg/dL 8  Creatinine Latest Range: 0.50-1.10 mg/dL 8.29  Calcium Latest Range: 8.4-10.5 mg/dL 8.6  GFR calc non Af Amer Latest Range: >90 mL/min >90  GFR calc Af Amer Latest Range: >90 mL/min >90  Glucose Latest Range: 70-99 mg/dL 562 (H)  Results for [name], [name] (MRN [id number]) as of 07/12/2012 16:46  Ref. Range 07/12/2012 11:12 07/12/2012 12:13 07/12/2012 13:23 07/12/2012 14:28 07/12/2012 15:32  Glucose-Capillary Latest Range: 70-99 mg/dL 696 (H) 295 (H) 284 (H) 142 (H) 146 (H)  Results for [name], [name] (MRN [id number]) as of 07/12/2012 16:46  Ref. Range 07/11/2012 11:45  Hemoglobin A1C Latest Range: <5.7 % 8.7 (H)    Continue with GlucoStabilizer until acidosis is cleared per DKA protocol. AG is 14.  Will continue to follow.

## 2012-07-13 DIAGNOSIS — R7309 Other abnormal glucose: Secondary | ICD-10-CM

## 2012-07-13 LAB — CBC
HCT: 31.7 % — ABNORMAL LOW (ref 36.0–46.0)
Hemoglobin: 10.7 g/dL — ABNORMAL LOW (ref 12.0–15.0)
MCH: 26.8 pg (ref 26.0–34.0)
MCHC: 33.8 g/dL (ref 30.0–36.0)
MCV: 79.3 fL (ref 78.0–100.0)
Platelets: 337 10*3/uL (ref 150–400)
RBC: 4 MIL/uL (ref 3.87–5.11)
RDW: 14.2 % (ref 11.5–15.5)
WBC: 5.7 10*3/uL (ref 4.0–10.5)

## 2012-07-13 LAB — BASIC METABOLIC PANEL
BUN: 12 mg/dL (ref 6–23)
BUN: 14 mg/dL (ref 6–23)
BUN: 6 mg/dL (ref 6–23)
BUN: 6 mg/dL (ref 6–23)
BUN: 6 mg/dL (ref 6–23)
BUN: 9 mg/dL (ref 6–23)
CO2: 16 mEq/L — ABNORMAL LOW (ref 19–32)
CO2: 18 mEq/L — ABNORMAL LOW (ref 19–32)
CO2: 18 mEq/L — ABNORMAL LOW (ref 19–32)
CO2: 19 mEq/L (ref 19–32)
CO2: 22 mEq/L (ref 19–32)
CO2: 22 mEq/L (ref 19–32)
Calcium: 8.3 mg/dL — ABNORMAL LOW (ref 8.4–10.5)
Calcium: 8.4 mg/dL (ref 8.4–10.5)
Calcium: 8.5 mg/dL (ref 8.4–10.5)
Calcium: 8.7 mg/dL (ref 8.4–10.5)
Calcium: 8.9 mg/dL (ref 8.4–10.5)
Calcium: 9.1 mg/dL (ref 8.4–10.5)
Chloride: 100 mEq/L (ref 96–112)
Chloride: 100 mEq/L (ref 96–112)
Chloride: 101 mEq/L (ref 96–112)
Chloride: 101 mEq/L (ref 96–112)
Chloride: 101 mEq/L (ref 96–112)
Chloride: 103 mEq/L (ref 96–112)
Creatinine, Ser: 0.5 mg/dL (ref 0.50–1.10)
Creatinine, Ser: 0.51 mg/dL (ref 0.50–1.10)
Creatinine, Ser: 0.51 mg/dL (ref 0.50–1.10)
Creatinine, Ser: 0.51 mg/dL (ref 0.50–1.10)
Creatinine, Ser: 0.53 mg/dL (ref 0.50–1.10)
Creatinine, Ser: 0.57 mg/dL (ref 0.50–1.10)
GFR calc Af Amer: >90 mL/min (ref >90)
GFR calc Af Amer: >90 mL/min (ref >90)
GFR calc Af Amer: >90 mL/min (ref >90)
GFR calc Af Amer: >90 mL/min (ref >90)
GFR calc Af Amer: >90 mL/min (ref >90)
GFR calc Af Amer: >90 mL/min (ref >90)
GFR calc non Af Amer: >90 mL/min (ref >90)
GFR calc non Af Amer: >90 mL/min (ref >90)
GFR calc non Af Amer: >90 mL/min (ref >90)
GFR calc non Af Amer: >90 mL/min (ref >90)
GFR calc non Af Amer: >90 mL/min (ref >90)
GFR calc non Af Amer: >90 mL/min (ref >90)
Glucose, Bld: 131 mg/dL — ABNORMAL HIGH (ref 70–99)
Glucose, Bld: 152 mg/dL — ABNORMAL HIGH (ref 70–99)
Glucose, Bld: 202 mg/dL — ABNORMAL HIGH (ref 70–99)
Glucose, Bld: 239 mg/dL — ABNORMAL HIGH (ref 70–99)
Glucose, Bld: 254 mg/dL — ABNORMAL HIGH (ref 70–99)
Glucose, Bld: 262 mg/dL — ABNORMAL HIGH (ref 70–99)
Potassium: 3.5 mEq/L (ref 3.5–5.1)
Potassium: 3.6 mEq/L (ref 3.5–5.1)
Potassium: 3.8 mEq/L (ref 3.5–5.1)
Potassium: 3.9 mEq/L (ref 3.5–5.1)
Potassium: 3.9 mEq/L (ref 3.5–5.1)
Potassium: 4.3 mEq/L (ref 3.5–5.1)
Sodium: 131 mEq/L — ABNORMAL LOW (ref 135–145)
Sodium: 131 mEq/L — ABNORMAL LOW (ref 135–145)
Sodium: 131 mEq/L — ABNORMAL LOW (ref 135–145)
Sodium: 132 mEq/L — ABNORMAL LOW (ref 135–145)
Sodium: 133 mEq/L — ABNORMAL LOW (ref 135–145)
Sodium: 134 mEq/L — ABNORMAL LOW (ref 135–145)

## 2012-07-13 LAB — GLUCOSE, CAPILLARY
Glucose-Capillary: 120 mg/dL — ABNORMAL HIGH (ref 70–99)
Glucose-Capillary: 139 mg/dL — ABNORMAL HIGH (ref 70–99)
Glucose-Capillary: 148 mg/dL — ABNORMAL HIGH (ref 70–99)
Glucose-Capillary: 193 mg/dL — ABNORMAL HIGH (ref 70–99)
Glucose-Capillary: 183 mg/dL — ABNORMAL HIGH (ref 70–99)
Glucose-Capillary: 135 mg/dL — ABNORMAL HIGH (ref 70–99)
Glucose-Capillary: 152 mg/dL — ABNORMAL HIGH (ref 70–99)
Glucose-Capillary: 134 mg/dL — ABNORMAL HIGH (ref 70–99)
Glucose-Capillary: 142 mg/dL — ABNORMAL HIGH (ref 70–99)
Glucose-Capillary: 244 mg/dL — ABNORMAL HIGH (ref 70–99)
Glucose-Capillary: 236 mg/dL — ABNORMAL HIGH (ref 70–99)
Glucose-Capillary: 206 mg/dL — ABNORMAL HIGH (ref 70–99)

## 2012-07-13 LAB — URINE CULTURE: Colony Count: 7000

## 2012-07-13 MED ORDER — MENTHOL 3 MG MT LOZG
1.0000 | LOZENGE | OROMUCOSAL | Status: DC | PRN
  Filled 2012-07-13: qty 9

## 2012-07-13 MED ORDER — INSULIN ASPART 100 UNIT/ML ~~LOC~~ SOLN
0.0000 [IU] | Freq: Every day | SUBCUTANEOUS | Status: DC
  Administered 2012-07-13: 2 [IU] via SUBCUTANEOUS

## 2012-07-13 MED ORDER — INSULIN ASPART 100 UNIT/ML ~~LOC~~ SOLN
0.0000 [IU] | Freq: Three times a day (TID) | SUBCUTANEOUS | Status: DC
  Administered 2012-07-13: 2 [IU] via SUBCUTANEOUS
  Administered 2012-07-13 (×2): 5 [IU] via SUBCUTANEOUS
  Administered 2012-07-14: 8 [IU] via SUBCUTANEOUS
  Administered 2012-07-14: 5 [IU] via SUBCUTANEOUS

## 2012-07-13 MED ORDER — INSULIN GLARGINE 100 UNIT/ML ~~LOC~~ SOLN
10.0000 [IU] | SUBCUTANEOUS | Status: AC
  Administered 2012-07-13: 10 [IU] via SUBCUTANEOUS

## 2012-07-13 MED ORDER — FLUCONAZOLE 100MG IVPB
100.0000 mg | INTRAVENOUS | Status: DC
  Administered 2012-07-13: 100 mg via INTRAVENOUS
  Filled 2012-07-13 (×2): qty 50

## 2012-07-13 NOTE — Progress Notes (Signed)
Inpatient Diabetes Program Recommendations  AACE/ADA: New Consensus Statement on Inpatient Glycemic Control (2013)  Target Ranges:  Prepandial:   less than 140 mg/dL      Peak postprandial:   less than 180 mg/dL (1-2 hours)      Critically ill patients:  140 - 180 mg/dL   Reason for Visit: DKA - Transition to SQ insulin  Pt states Dr. Leslie Dales is endo and sees him approx every 6 months for insulin adjustments.  Latest insulin regimen is N20 bid and Humalog 4 units tidwc plus s/s.  States she has been in school and has had lots of stress, not eating very well, and trying to manage her diabetes.  Thinks she went into DKA because of eating "bad" crab-legs and being stressed with school.  Will f/u with endo in next few weeks.   Results for [name], [name] (MRN [id number]) as of 07/13/2012 14:08  Ref. Range 07/13/2012 05:16 07/13/2012 06:14 07/13/2012 07:19 07/13/2012 08:06 07/13/2012 12:34  Glucose-Capillary Latest Range: 70-99 mg/dL 147 (H) 829 (H) 562 (H) 142 (H) 244 (H)    Results for [name], [name] (MRN [id number]) as of 07/13/2012 14:08  Ref. Range 07/11/2012 11:45  Hemoglobin A1C Latest Range: <5.7 % 8.7 (H)     Inpatient Diabetes Program Recommendations Insulin - Basal: Add NPH 15 units QHS and 20 units QAM Correction (SSI): Decrease Novolog correction to sensitive tidwc and hs Insulin - Meal Coverage: Add Novolog 4 units tidwc if pt eats >50% meal  Will continue to follow.

## 2012-07-13 NOTE — Progress Notes (Signed)
[facility] PROGRESS NOTE  [name]:[id number] DOB: 28-Apr-1986 DOA: 07/11/2012 PCP: No primary provider on file.  Brief narrative: 26 year old female with past medical history of diabetes mellitus type I who presented to ED with complaints of intractable nausea and vomiting started about 1 day prior to this admission associated with occasional diffuse cramp-like abdominal pain. Patient was admitted to stepdown for management of DKA. We have stopped insulin drip 07/13/2012 and diet is advanced to carb modified. In addition, we are treating the patient for upper respiratory tract infection (possible bronchitis). Urinalysis revealed yeast for which she is given fluconazole.  Assessment and Plan:   Principal Problem:  *DKA (diabetic ketoacidoses)  Perhaps exacerbated by upper respiratory tract infection, bronchitis or UTI  AG closed and CBG's  In past few hours are as follows: 142, 134, 135 Continue IV fluids D5/NS @ 75 cc/hr Diet advanced to carb modified  Active Problems:  Leukocytosis  Secondary to bronchitis and/or UTI Continue azithromycin and we have also added fluconazole Follow up urinalysis shows yeast in urine Flu test negative Hyperkalemia  Resolved High anion gap metabolic acidosis  Secondary to DKA  Resolved Manage as above   Manson Passey  [facility]  [phone]   Antibiotics:  Azithromycin 07/11/2012 --> Fluconazole -->   If 7PM-7AM, please contact night-coverage [email or web address] Password [facility] 07/13/2012, 8:58 AM   LOS: 2 days   HPI/Subjective: No acute overnight events.  Objective: Filed Vitals:   07/13/12 0010 07/13/12 0417 07/13/12 0815 07/13/12 0833  BP: 124/74 99/62 120/75   Pulse:   98   Temp:  98.8 F (37.1 C)  98 F (36.7 C)  TempSrc:  Axillary  Oral  Resp: 18 17 17    Height:      Weight:  62.9 kg (138 lb 10.7 oz)    SpO2: 100% 100% 100%     Intake/Output Summary (Last 24 hours) at 07/13/12 0858 Last data filed at 07/13/12 0700  Gross per 24 hour  Intake 1687.2 ml  Output   2100 ml  Net -412.8 ml    Exam:   General:  Pt is alert, follows commands appropriately, not in acute distress  Cardiovascular: Regular rate and rhythm, S1/S2, no murmurs, no rubs, no gallops  Respiratory: Clear to auscultation bilaterally, no wheezing, no crackles, no rhonchi  Abdomen: Soft, non tender, non distended, bowel sounds present, no guarding  Extremities: No edema, pulses DP and PT palpable bilaterally  Neuro: Grossly nonfocal  Data Reviewed: Basic Metabolic Panel:  Lab 07/13/12 8657 07/13/12 0015 07/12/12 2025 07/12/12 1603 07/12/12 1240  NA 133* 134* 132* 133* 134*  K 3.9 3.5 4.1 4.2 3.7  CL 101 103 100 103 104  CO2 16* 18* 16* 16* 18*  GLUCOSE 202* 131* 195* 170* 132*  BUN 6 6 7 8 9   CREATININE 0.51 0.53 0.59 0.57 0.60  CALCIUM 8.3* 8.7 8.8 8.6 8.8   Liver Function Tests:  Lab 07/12/12 0340 07/11/12 1145  AST 40* 41*  ALT 29 29  ALKPHOS 54 64  BILITOT 0.5 0.6  PROT 6.2 7.8  ALBUMIN 3.2* 4.3    Lab 07/11/12 1145  LIPASE 12  AMYLASE --   CBC:  Lab 07/13/12 0350 07/12/12 0340 07/11/12 1509 07/11/12 1145  WBC 5.7 14.5* 16.6* 14.2*  HGB 10.7* 10.9* 11.6* 12.9  HCT 31.7* 33.9* 37.2 39.3  MCV 79.3 81.7 83.6 82.9  PLT 337 351 370 375   CBG:  Lab 07/13/12 0806 07/13/12 0719 07/13/12 0614 07/13/12 0516  07/13/12 0415  GLUCAP 142* 134* 135* 152* 183*    MRSA PCR SCREENING     Status: Normal   Collection Time   07/11/12  6:11 PM      Component Value Range Status Comment   MRSA by PCR NEGATIVE  NEGATIVE Final   URINE CULTURE     Status: Normal   Collection Time   07/11/12  6:12 PM      Component Value Range Status Comment   Specimen Description URINE, CLEAN CATCH   Final    Colony Count 7,000 COLONIES/ML   Final    Culture YEAST   Final    Report Status 07/13/2012 FINAL   Final      Studies: No results found.  Scheduled Meds:  . azithromycin  500 mg Intravenous Q24H  . fluconazole  (DIFLUCAN)   100 mg Intravenous Q24H  . insulin aspart  0-15 Units Subcutaneous TID WC  . insulin aspart  0-5 Units Subcutaneous QHS

## 2012-07-13 NOTE — Progress Notes (Signed)
Katherine,NP notified of pt being off insulin per the glucose stabilizer. At this time CO2 level is 18 and the Anion Gap is 13. Pt has had more than 4 glucose levels within target range. Katherine,NP states to continue glucose stabilizer until the BMET at 0400 is obtained.

## 2012-07-13 NOTE — Progress Notes (Signed)
Pt stated that IV azithromycin was causing her arm pain. Decreased rate and pain continued. Assessed IV site and it was warm to touch with light red streak. Stopped IV fluids in general. Called Dr. Elisabeth Pigeon. Received permission at 1840 to not continue IV fluids and to take out IV. Plan is for antibiotics to be switched to PO.

## 2012-07-14 LAB — BASIC METABOLIC PANEL
BUN: 11 mg/dL (ref 6–23)
BUN: 10 mg/dL (ref 6–23)
CO2: 21 mEq/L (ref 19–32)
CO2: 21 mEq/L (ref 19–32)
Calcium: 8.9 mg/dL (ref 8.4–10.5)
Calcium: 8.9 mg/dL (ref 8.4–10.5)
Chloride: 100 mEq/L (ref 96–112)
Chloride: 101 mEq/L (ref 96–112)
Creatinine, Ser: 0.49 mg/dL — ABNORMAL LOW (ref 0.50–1.10)
Creatinine, Ser: 0.52 mg/dL (ref 0.50–1.10)
GFR calc Af Amer: >90 mL/min (ref >90)
GFR calc Af Amer: >90 mL/min (ref >90)
GFR calc non Af Amer: >90 mL/min (ref >90)
GFR calc non Af Amer: >90 mL/min (ref >90)
Glucose, Bld: 258 mg/dL — ABNORMAL HIGH (ref 70–99)
Glucose, Bld: 246 mg/dL — ABNORMAL HIGH (ref 70–99)
Potassium: 3.3 mEq/L — ABNORMAL LOW (ref 3.5–5.1)
Potassium: 3.8 mEq/L (ref 3.5–5.1)
Sodium: 133 mEq/L — ABNORMAL LOW (ref 135–145)
Sodium: 133 mEq/L — ABNORMAL LOW (ref 135–145)

## 2012-07-14 LAB — GLUCOSE, CAPILLARY
Glucose-Capillary: 226 mg/dL — ABNORMAL HIGH (ref 70–99)
Glucose-Capillary: 252 mg/dL — ABNORMAL HIGH (ref 70–99)

## 2012-07-14 MED ORDER — FLUCONAZOLE 100 MG PO TABS: 100.0000 mg | ORAL_TABLET | Freq: Every day | ORAL | Status: DC

## 2012-07-14 MED ORDER — AZITHROMYCIN 500 MG PO TABS
500.0000 mg | ORAL_TABLET | Freq: Every day | ORAL | Status: DC
  Administered 2012-07-14: 500 mg via ORAL
  Filled 2012-07-14: qty 1

## 2012-07-14 MED ORDER — AZITHROMYCIN 500 MG PO TABS: 500.0000 mg | ORAL_TABLET | Freq: Every day | ORAL | Status: DC

## 2012-07-14 MED ORDER — OMEPRAZOLE 20 MG PO CPDR: 20.0000 mg | DELAYED_RELEASE_CAPSULE | Freq: Every day | ORAL | Status: DC

## 2012-07-14 MED ORDER — FLUCONAZOLE 100 MG PO TABS
100.0000 mg | ORAL_TABLET | Freq: Every day | ORAL | Status: DC
  Administered 2012-07-14: 100 mg via ORAL
  Filled 2012-07-14: qty 1

## 2012-07-14 NOTE — Progress Notes (Signed)
PHARMACIST - PHYSICIAN COMMUNICATION CONCERNING: Antibiotic IV to Oral Route Change Policy  RECOMMENDATION: This patient is receiving azithromycin and fluconazole by the intravenous route.  Based on criteria approved by the Pharmacy and Therapeutics Committee, the antibiotic(s) is/are being converted to the equivalent oral dose form(s).   DESCRIPTION: These criteria include:  Patient being treated for a respiratory tract infection, urinary tract infection, or cellulitis  The patient is not neutropenic and does not exhibit a GI malabsorption state  The patient is eating (either orally or via tube) and/or has been taking other orally administered medications for a least 24 hours  The patient is improving clinically and has a Tmax < 100.5  *It has also been noted that patient has lost IV access, and PO medications are appropriate. Defer duration of antibiotics to MD.  If you have questions about this conversion, please contact the Pharmacy Department  []   [phone] )  [facility] []   [phone] )  [facility]  []   [phone] )  [facility] [x]   [phone] )  [facility]   [place], [place].D. Clinical Oncology Pharmacist  Pager # [phone]

## 2012-07-14 NOTE — Discharge Summary (Signed)
Physician Discharge Summary  [name]:[id number] DOB: [birth year] DOA: 07/11/2012  PCP: Junious Silk, MD  Admit date: 07/11/2012 Discharge date: 07/14/2012  Time spent: 35 minutes  Recommendations for Outpatient Follow-up:  Continue to encourage compliance   Discharge Diagnoses:  DKA (diabetic ketoacidoses) resolved   Leukocytosis- reactive - resolved   Hyperkalemia - resolved   High anion gap metabolic acidosis - resolved    Discharge Condition: good  Diet recommendation: diabetic   Filed Weights   07/12/12 0500 07/13/12 0417 07/13/12 1015  Weight: 65.772 kg (145 lb) 62.9 kg (138 lb 10.7 oz) 62.687 kg (138 lb 3.2 oz)    History of present illness:  26 year old female with past medical history of diabetes mellitus type I who presented to ED with complaints of intractable nausea and vomiting started about 1 day prior to this admission associated with occasional diffuse cramp-like abdominal pain. Patient reported her sugar was high when she measured it at home. She also reported she is compliant with home insulin regimen but she decided to come to ED because she was persistently nauseas and was vomiting. She also reported occasional cough and feeling generalized aching and tiredness. Patient reports subjective fever but no measured temperature, no shortness of breath, no lightheadedness, no loss of consciousness. No chest pain, no headaches.  In ED patient was found to be in DKA with AG elevated at 27. Her EKG showed sinus tachycardia, otherwise the rest of the blood work up was within normal limits.      Hospital Course:  DKA (diabetic ketoacidoses)  Perhaps exacerbated by upper respiratory tract infection, bronchitis or UTI  AG closed and acidosis corrected  Patient received IV fluids  Diet advanced to carb modified  Leukocytosis  Secondary to bronchitis and fungal UTI  Patient received azithromycin and  fluconazole  Follow up urinalysis shows yeast in  urine  Resolved by discharge  Hyperkalemia  Resolved with correction of acidosis   High anion gap metabolic acidosis  Secondary to DKA  Resolved     Procedures: none Consultations:  none  Discharge Exam: Filed Vitals:   07/13/12 2124 07/14/12 0027 07/14/12 0028 07/14/12 0540  BP: 128/92   127/71  Pulse: 112 116 100 87  Temp: 98.9 F (37.2 C)   98.4 F (36.9 C)  TempSrc: Oral   Oral  Resp: 16   16  Height:      Weight:      SpO2: 100%   100%    General: axox3 Cardiovascular: rrr Respiratory: ctab    Discharge Instructions  Discharge Orders    Future Orders Please Complete By Expires   Diet Carb Modified      Increase activity slowly          Medication List     As of 07/14/2012  9:34 AM    TAKE these medications         azithromycin 500 MG tablet   Commonly known as: ZITHROMAX   Take 1 tablet (500 mg total) by mouth daily.      fluconazole 100 MG tablet   Commonly known as: DIFLUCAN   Take 1 tablet (100 mg total) by mouth daily.      HUMALOG KWIKPEN 100 UNIT/ML injection   Generic drug: insulin lispro   Inject 0-4 Units into the skin 3 (three) times daily before meals. Pt uses sliding scale before meals      insulin NPH 100 UNIT/ML injection   Commonly known as: HUMULIN N,NOVOLIN N  Inject 20 Units into the skin 3 (three) times daily. Pt states she buys this over the counter and she uses 20 units 3 times daily depending on her meal      omeprazole 20 MG capsule   Commonly known as: PRILOSEC   Take 1 capsule (20 mg total) by mouth daily.           Follow-up Information    Schedule an appointment as soon as possible for a visit with Junious Silk, MD.   Contact information:   [address] [place] [address] [phone]           The results of significant diagnostics from this hospitalization (including imaging, microbiology, ancillary and laboratory) are listed below for reference.    Significant  Diagnostic Studies: No results found.  Microbiology: Recent Results (from the past 240 hour(s))  MRSA PCR SCREENING     Status: Normal   Collection Time   07/11/12  6:11 PM      Component Value Range Status Comment   MRSA by PCR NEGATIVE  NEGATIVE Final   URINE CULTURE     Status: Normal   Collection Time   07/11/12  6:12 PM      Component Value Range Status Comment   Specimen Description URINE, CLEAN CATCH   Final    Special Requests NONE   Final    Culture  Setup Time 07/12/2012 04:14   Final    Colony Count 7,000 COLONIES/ML   Final    Culture YEAST   Final    Report Status 07/13/2012 FINAL   Final      Labs: Basic Metabolic Panel:  Lab 07/14/12 2130 07/14/12 0030 07/13/12 1950 07/13/12 1630 07/13/12 1230  NA 133* 133* 131* 131* 131*  K 3.8 3.3* 3.9 3.8 4.3  CL 101 100 101 100 100  CO2 21 21 22 22  18*  GLUCOSE 246* 258* 254* 239* 262*  BUN 10 11 14 12 9   CREATININE 0.52 0.49* 0.51 0.57 0.50  CALCIUM 8.9 8.9 9.1 8.9 8.5  MG -- -- -- -- --  PHOS -- -- -- -- --   Liver Function Tests:  Lab 07/12/12 0340 07/11/12 1145  AST 40* 41*  ALT 29 29  ALKPHOS 54 64  BILITOT 0.5 0.6  PROT 6.2 7.8  ALBUMIN 3.2* 4.3    Lab 07/11/12 1145  LIPASE 12  AMYLASE --   No results found for this basename: AMMONIA:5 in the last 168 hours CBC:  Lab 07/13/12 0350 07/12/12 0340 07/11/12 1509 07/11/12 1145  WBC 5.7 14.5* 16.6* 14.2*  NEUTROABS -- -- -- 12.8*  HGB 10.7* 10.9* 11.6* 12.9  HCT 31.7* 33.9* 37.2 39.3  MCV 79.3 81.7 83.6 82.9  PLT 337 351 370 375   Cardiac Enzymes: No results found for this basename: CKTOTAL:5,CKMB:5,CKMBINDEX:5,TROPONINI:5 in the last 168 hours BNP: BNP (last 3 results) No results found for this basename: PROBNP:3 in the last 8760 hours CBG:  Lab 07/14/12 0730 07/13/12 2122 07/13/12 1710 07/13/12 1234 07/13/12 0806  GLUCAP 226* 206* 236* 244* 142*       Signed:  Clifford Coudriet  [facility] 07/14/2012, 9:34 AM

## 2012-07-14 NOTE — Progress Notes (Signed)
Patient given discharge instructions and she verbalized understanding using teach back method.  Patient also given multiple educational handouts related to managing diabetes and stress and she states she will review these later.  Patient states she is able to check her blood sugars at home and she did demonstrate self-administration of insulin twice for me.  Patient currently stable for discharge and her assessment remains unchanged from this am.  Allayne Butcher [facility]  07/14/2012  1:25 PM

## 2013-02-10 ENCOUNTER — Encounter (HOSPITAL_COMMUNITY): Payer: Self-pay | Admitting: Emergency Medicine

## 2013-02-10 ENCOUNTER — Emergency Department (HOSPITAL_COMMUNITY): Payer: Self-pay

## 2013-02-10 ENCOUNTER — Inpatient Hospital Stay (HOSPITAL_COMMUNITY)
Admission: EM | Admit: 2013-02-10 | Discharge: 2013-02-12 | DRG: 639 | Disposition: A | Discharge status: Home / Self Care | Payer: MEDICAID | Attending: Family Medicine | Admitting: Family Medicine

## 2013-02-10 DIAGNOSIS — E876 Hypokalemia: Secondary | ICD-10-CM | POA: Diagnosis not present

## 2013-02-10 DIAGNOSIS — E872 Acidosis, unspecified: Secondary | ICD-10-CM

## 2013-02-10 DIAGNOSIS — E1010 Type 1 diabetes mellitus with ketoacidosis without coma: Principal | ICD-10-CM | POA: Diagnosis present

## 2013-02-10 DIAGNOSIS — E875 Hyperkalemia: Secondary | ICD-10-CM | POA: Diagnosis present

## 2013-02-10 DIAGNOSIS — E1110 Type 2 diabetes mellitus with ketoacidosis without coma: Secondary | ICD-10-CM

## 2013-02-10 DIAGNOSIS — E8729 Other acidosis: Secondary | ICD-10-CM | POA: Diagnosis present

## 2013-02-10 DIAGNOSIS — F411 Generalized anxiety disorder: Secondary | ICD-10-CM | POA: Diagnosis present

## 2013-02-10 DIAGNOSIS — Z794 Long term (current) use of insulin: Secondary | ICD-10-CM

## 2013-02-10 DIAGNOSIS — D72829 Elevated white blood cell count, unspecified: Secondary | ICD-10-CM | POA: Diagnosis present

## 2013-02-10 LAB — CBC WITH DIFFERENTIAL/PLATELET
Basophils Absolute: 0 10*3/uL (ref 0.0–0.1)
Basophils Relative: 0 % (ref 0–1)
Eosinophils Absolute: 0 10*3/uL (ref 0.0–0.7)
Eosinophils Relative: 0 % (ref 0–5)
HCT: 47.1 % — ABNORMAL HIGH (ref 36.0–46.0)
Hemoglobin: 15.1 g/dL — ABNORMAL HIGH (ref 12.0–15.0)
Lymphocytes Relative: 4 % — ABNORMAL LOW (ref 12–46)
Lymphs Abs: 0.6 10*3/uL — ABNORMAL LOW (ref 0.7–4.0)
MCH: 26.6 pg (ref 26.0–34.0)
MCHC: 32.1 g/dL (ref 30.0–36.0)
MCV: 83.1 fL (ref 78.0–100.0)
Monocytes Absolute: 0.5 10*3/uL (ref 0.1–1.0)
Monocytes Relative: 4 % (ref 3–12)
Neutro Abs: 13.7 10*3/uL — ABNORMAL HIGH (ref 1.7–7.7)
Neutrophils Relative %: 92 % — ABNORMAL HIGH (ref 43–77)
Platelets: 320 10*3/uL (ref 150–400)
RBC: 5.67 MIL/uL — ABNORMAL HIGH (ref 3.87–5.11)
RDW: 14.4 % (ref 11.5–15.5)
WBC: 14.8 10*3/uL — ABNORMAL HIGH (ref 4.0–10.5)

## 2013-02-10 LAB — BLOOD GAS, VENOUS
Acid-base deficit: 25.1 mmol/L — ABNORMAL HIGH (ref 0.0–2.0)
Bicarbonate: 5.3 mEq/L — ABNORMAL LOW (ref 20.0–24.0)
O2 Saturation: 84.7 %
Patient temperature: 98.6
TCO2: 5.3 mmol/L (ref 0–100)
pCO2, Ven: 21 mmHg — ABNORMAL LOW (ref 45.0–50.0)
pH, Ven: 7.036 — CL (ref 7.250–7.300)
pO2, Ven: 58.7 mmHg — ABNORMAL HIGH (ref 30.0–45.0)

## 2013-02-10 LAB — CBC
HCT: 38.7 % (ref 36.0–46.0)
Hemoglobin: 12.8 g/dL (ref 12.0–15.0)
MCH: 27 pg (ref 26.0–34.0)
MCHC: 33.1 g/dL (ref 30.0–36.0)
MCV: 81.6 fL (ref 78.0–100.0)
Platelets: 320 10*3/uL (ref 150–400)
RBC: 4.74 MIL/uL (ref 3.87–5.11)
RDW: 14.3 % (ref 11.5–15.5)
WBC: 17.9 10*3/uL — ABNORMAL HIGH (ref 4.0–10.5)

## 2013-02-10 LAB — BASIC METABOLIC PANEL
BUN: 12 mg/dL (ref 6–23)
BUN: 10 mg/dL (ref 6–23)
BUN: 9 mg/dL (ref 6–23)
CO2: 8 mEq/L — CL (ref 19–32)
CO2: 10 mEq/L — CL (ref 19–32)
CO2: 13 mEq/L — ABNORMAL LOW (ref 19–32)
Calcium: 8.6 mg/dL (ref 8.4–10.5)
Calcium: 9 mg/dL (ref 8.4–10.5)
Calcium: 9.7 mg/dL (ref 8.4–10.5)
Chloride: 106 mEq/L (ref 96–112)
Chloride: 104 mEq/L (ref 96–112)
Chloride: 102 mEq/L (ref 96–112)
Creatinine, Ser: 0.66 mg/dL (ref 0.50–1.10)
Creatinine, Ser: 0.67 mg/dL (ref 0.50–1.10)
Creatinine, Ser: 0.66 mg/dL (ref 0.50–1.10)
GFR calc Af Amer: >90 mL/min (ref >90)
GFR calc Af Amer: >90 mL/min (ref >90)
GFR calc Af Amer: >90 mL/min (ref >90)
GFR calc non Af Amer: >90 mL/min (ref >90)
GFR calc non Af Amer: >90 mL/min (ref >90)
GFR calc non Af Amer: >90 mL/min (ref >90)
Glucose, Bld: 189 mg/dL — ABNORMAL HIGH (ref 70–99)
Glucose, Bld: 168 mg/dL — ABNORMAL HIGH (ref 70–99)
Glucose, Bld: 126 mg/dL — ABNORMAL HIGH (ref 70–99)
Potassium: 4.8 mEq/L (ref 3.5–5.1)
Potassium: 4.4 mEq/L (ref 3.5–5.1)
Potassium: 4 mEq/L (ref 3.5–5.1)
Sodium: 133 mEq/L — ABNORMAL LOW (ref 135–145)
Sodium: 132 mEq/L — ABNORMAL LOW (ref 135–145)
Sodium: 132 mEq/L — ABNORMAL LOW (ref 135–145)

## 2013-02-10 LAB — URINALYSIS, ROUTINE W REFLEX MICROSCOPIC
Bilirubin Urine: NEGATIVE
Glucose, UA: >1000 mg/dL — AB
Ketones, ur: >80 mg/dL — AB
Leukocytes, UA: NEGATIVE
Nitrite: NEGATIVE
Protein, ur: 30 mg/dL — AB
Specific Gravity, Urine: 1.026 (ref 1.005–1.030)
Urobilinogen, UA: 0.2 mg/dL (ref 0.0–1.0)
pH: 5 (ref 5.0–8.0)

## 2013-02-10 LAB — TROPONIN I: Troponin I: <0.3 ng/mL (ref <0.30)

## 2013-02-10 LAB — COMPREHENSIVE METABOLIC PANEL
ALT: 16 U/L (ref 0–35)
AST: 20 U/L (ref 0–37)
Albumin: 5.2 g/dL (ref 3.5–5.2)
Alkaline Phosphatase: 84 U/L (ref 39–117)
BUN: 16 mg/dL (ref 6–23)
CO2: 8 mEq/L — CL (ref 19–32)
Calcium: 10.3 mg/dL (ref 8.4–10.5)
Chloride: 95 mEq/L — ABNORMAL LOW (ref 96–112)
Creatinine, Ser: 0.68 mg/dL (ref 0.50–1.10)
GFR calc Af Amer: >90 mL/min (ref >90)
GFR calc non Af Amer: >90 mL/min (ref >90)
Glucose, Bld: 317 mg/dL — ABNORMAL HIGH (ref 70–99)
Potassium: 5.7 mEq/L — ABNORMAL HIGH (ref 3.5–5.1)
Sodium: 131 mEq/L — ABNORMAL LOW (ref 135–145)
Total Bilirubin: 0.4 mg/dL (ref 0.3–1.2)
Total Protein: 10 g/dL — ABNORMAL HIGH (ref 6.0–8.3)

## 2013-02-10 LAB — GLUCOSE, CAPILLARY
Glucose-Capillary: 300 mg/dL — ABNORMAL HIGH (ref 70–99)
Glucose-Capillary: 210 mg/dL — ABNORMAL HIGH (ref 70–99)
Glucose-Capillary: 188 mg/dL — ABNORMAL HIGH (ref 70–99)
Glucose-Capillary: 166 mg/dL — ABNORMAL HIGH (ref 70–99)
Glucose-Capillary: 187 mg/dL — ABNORMAL HIGH (ref 70–99)
Glucose-Capillary: 158 mg/dL — ABNORMAL HIGH (ref 70–99)
Glucose-Capillary: 137 mg/dL — ABNORMAL HIGH (ref 70–99)
Glucose-Capillary: 130 mg/dL — ABNORMAL HIGH (ref 70–99)
Glucose-Capillary: 112 mg/dL — ABNORMAL HIGH (ref 70–99)

## 2013-02-10 LAB — D-DIMER, QUANTITATIVE (NOT AT ARMC): D-Dimer, Quant: 1.33 ug/mL-FEU — ABNORMAL HIGH (ref 0.00–0.48)

## 2013-02-10 LAB — URINE MICROSCOPIC-ADD ON

## 2013-02-10 LAB — LIPASE, BLOOD: Lipase: 17 U/L (ref 11–59)

## 2013-02-10 LAB — PREGNANCY, URINE: Preg Test, Ur: NEGATIVE

## 2013-02-10 LAB — MRSA PCR SCREENING: MRSA by PCR: NEGATIVE

## 2013-02-10 MED ORDER — MORPHINE SULFATE 4 MG/ML IJ SOLN
4.0000 mg | INTRAMUSCULAR | Status: DC | PRN
  Administered 2013-02-10 – 2013-02-11 (×5): 4 mg via INTRAVENOUS
  Filled 2013-02-10 (×4): qty 1

## 2013-02-10 MED ORDER — FLUCONAZOLE 100 MG PO TABS: 100.0000 mg | ORAL_TABLET | Freq: Every day | ORAL | Status: DC

## 2013-02-10 MED ORDER — DEXTROSE 50 % IV SOLN: 25.0000 mL | INTRAVENOUS | Status: DC | PRN

## 2013-02-10 MED ORDER — SODIUM CHLORIDE 0.9 % IV SOLN: INTRAVENOUS | Status: DC

## 2013-02-10 MED ORDER — INSULIN GLARGINE 100 UNITS/ML SOLOSTAR PEN: 10.0000 [IU] | PEN_INJECTOR | Freq: Every day | SUBCUTANEOUS | Status: DC

## 2013-02-10 MED ORDER — ONDANSETRON HCL 4 MG/2ML IJ SOLN: 4.0000 mg | Freq: Three times a day (TID) | INTRAMUSCULAR | Status: DC | PRN

## 2013-02-10 MED ORDER — INSULIN ASPART 100 UNIT/ML ~~LOC~~ SOLN
10.0000 [IU] | Freq: Once | SUBCUTANEOUS | Status: AC
  Administered 2013-02-10: 10 [IU] via SUBCUTANEOUS
  Filled 2013-02-10: qty 1

## 2013-02-10 MED ORDER — ONDANSETRON HCL 4 MG/2ML IJ SOLN
4.0000 mg | Freq: Once | INTRAMUSCULAR | Status: AC
  Administered 2013-02-10: 4 mg via INTRAVENOUS
  Filled 2013-02-10: qty 2

## 2013-02-10 MED ORDER — ENOXAPARIN SODIUM 40 MG/0.4ML ~~LOC~~ SOLN
40.0000 mg | SUBCUTANEOUS | Status: DC
  Administered 2013-02-10 – 2013-02-11 (×2): 40 mg via SUBCUTANEOUS
  Filled 2013-02-10 (×4): qty 0.4

## 2013-02-10 MED ORDER — DEXTROSE-NACL 5-0.45 % IV SOLN
INTRAVENOUS | Status: DC
  Administered 2013-02-10: 16:00:00 via INTRAVENOUS

## 2013-02-10 MED ORDER — SODIUM CHLORIDE 0.9 % IV SOLN: 1000.0000 mL | INTRAVENOUS | Status: DC

## 2013-02-10 MED ORDER — PANTOPRAZOLE SODIUM 40 MG PO TBEC: 40.0000 mg | DELAYED_RELEASE_TABLET | Freq: Every day | ORAL | Status: DC

## 2013-02-10 MED ORDER — SODIUM CHLORIDE 0.9 % IV SOLN
INTRAVENOUS | Status: DC
  Filled 2013-02-10: qty 1

## 2013-02-10 MED ORDER — SODIUM CHLORIDE 0.9 % IV SOLN
1000.0000 mL | Freq: Once | INTRAVENOUS | Status: AC
Start: 2013-02-10 — End: 2013-02-10
  Administered 2013-02-10: 1000 mL via INTRAVENOUS

## 2013-02-10 MED ORDER — DEXTROSE-NACL 5-0.45 % IV SOLN
INTRAVENOUS | Status: DC
  Administered 2013-02-11 – 2013-02-12 (×3): via INTRAVENOUS

## 2013-02-10 MED ORDER — SODIUM CHLORIDE 0.9 % IV SOLN
INTRAVENOUS | Status: DC
  Administered 2013-02-10: 1.5 [IU]/h via INTRAVENOUS
  Filled 2013-02-10: qty 1

## 2013-02-10 MED ORDER — MORPHINE SULFATE 4 MG/ML IJ SOLN
INTRAMUSCULAR | Status: AC
  Filled 2013-02-10: qty 1

## 2013-02-10 MED ORDER — MORPHINE SULFATE 4 MG/ML IJ SOLN
4.0000 mg | Freq: Once | INTRAMUSCULAR | Status: AC
  Administered 2013-02-10: 4 mg via INTRAVENOUS
  Filled 2013-02-10: qty 1

## 2013-02-10 MED ORDER — SODIUM CHLORIDE 0.9 % IV BOLUS (SEPSIS)
1000.0000 mL | Freq: Once | INTRAVENOUS | Status: AC
  Administered 2013-02-10: 1000 mL via INTRAVENOUS

## 2013-02-10 MED ORDER — POTASSIUM CHLORIDE 10 MEQ/100ML IV SOLN: 10.0000 meq | INTRAVENOUS | Status: AC

## 2013-02-10 MED ORDER — SODIUM CHLORIDE 0.9 % IV SOLN
1000.0000 mL | Freq: Once | INTRAVENOUS | Status: AC
  Administered 2013-02-10: 1000 mL via INTRAVENOUS

## 2013-02-10 MED ORDER — ENOXAPARIN SODIUM 30 MG/0.3ML ~~LOC~~ SOLN: 30.0000 mg | SUBCUTANEOUS | Status: DC

## 2013-02-10 NOTE — ED Notes (Signed)
ZOX:[id number]<AV> Expected date:<BR> Expected time:<BR> Means of arrival:<BR> Comments:<BR> ems 27 yo F, n/v/d, orthostatic

## 2013-02-10 NOTE — ED Notes (Signed)
Unable to get blood from pt, stopped IV fluids and will attempt in that arm in 15 mins.

## 2013-02-10 NOTE — ED Provider Notes (Signed)
  Medical screening examination/treatment/procedure(s) were performed by non-physician practitioner and as supervising physician I was immediately available for consultation/collaboration.    Gerhard Munch, MD 02/10/13 1538

## 2013-02-10 NOTE — ED Notes (Signed)
Per EMS: N/V/D x 2 days, has not been able to keep anything down. 20 g in left forearm 200 NS 4 of Zofran. CBG 279

## 2013-02-10 NOTE — Progress Notes (Signed)
[facility] has seen patient. Provided pt with a list of primary care resources.

## 2013-02-10 NOTE — Progress Notes (Signed)
Refuses to wear SCD's, scheduled Lovenox.

## 2013-02-10 NOTE — H&P (Signed)
[facility] History and Physical  [name] GEX:[id number] DOB: 02/15/1986 DOA: 02/10/2013  Referring physician: ED physician PCP: Junious Silk, MD   Chief Complaint:   HPI:  27 y.o. female with a past medical history of insulin-dependent diabetes admission for DKA one year ago. Patient presented to [facility] ED with main concern of progressively worsening generalized abdominal pain that started 1-2 weeks prior to admission and associated with nausea and non bloody vomiting, poor oral intake, intermittent and sharp, 10/10 in severity when present, radiating to lower back area occasionally. She reports similar event in the past that was due to very high sugar level. She denies any fevers, chills, no specific urinary concerns, no shortness of breath but endorses intermittent episodes of chest discomfort that occurs with abdominal pain.   In ED, pt with CBG in 300's, AG > 30 on initial BMP. [facility] asked to admit for management of DKA.   Assessment and Plan: Principal Problem:   DKA (diabetic ketoacidoses) - admit to stepdown unit and place on insulin drip - obtain BMP every 2 hours until gap closes - IVF to continue and keep NPO Active Problems:   Leukocytosis - no clear infectious source identified and this is possibly related to demargination and stress - will monitor closely    Hyperkalemia - repeat BMP with normal potassium, close monitoring    High anion gap metabolic acidosis - BMP Q2 hours until gap closes   Code Status: Full Family Communication: Pt at bedside Disposition Plan: Admit to stepdown units  Review of Systems:  Constitutional: Negative for fever, chills. Negative for diaphoresis.  HENT: Negative for hearing loss, ear pain, nosebleeds, congestion, sore throat, neck pain, tinnitus and ear discharge.   Eyes: Negative for blurred vision, double vision, photophobia, pain, discharge and redness.  Respiratory: Negative for hemoptysis, sputum production,  shortness of breath, wheezing and stridor.   Cardiovascular: Negative for palpitations, orthopnea, claudication and leg swelling.  Gastrointestinal: + for nausea, vomiting and abdominal pain. Negative for heartburn, constipation, blood in stool and melena.  Genitourinary: Negative for dysuria, urgency, frequency, hematuria and flank pain.  Musculoskeletal: Negative for back pain, joint pain and falls.  Skin: Negative for itching and rash.  Neurological: Negative for tingling, tremors, sensory change, speech change, focal weakness, loss of consciousness and headaches.  Endo/Heme/Allergies: Negative for environmental allergies and polydipsia. Does not bruise/bleed easily.  Psychiatric/Behavioral: Negative for suicidal ideas. The patient is not nervous/anxious.      Past Medical History  Diagnosis Date  . Diabetes mellitus without complication     History reviewed. No pertinent past surgical history.  Social History:  reports that she has never smoked. She has never used smokeless tobacco. She reports that  drinks alcohol. She reports that she does not use illicit drugs.  No Known Allergies  No family history of cancers   Prior to Admission medications   Medication Sig Start Date End Date Taking? Authorizing Provider  insulin lispro (HUMALOG KWIKPEN) 100 UNIT/ML injection Inject 0-4 Units into the skin 3 (three) times daily before meals. Pt uses sliding scale before meals   Yes Historical Provider, MD  insulin NPH (HUMULIN N,NOVOLIN N) 100 UNIT/ML injection Inject 20 Units into the skin 3 (three) times daily. Pt states she buys this over the counter and she uses 20 units 3 times daily depending on her meal   Yes Historical Provider, MD  azithromycin (ZITHROMAX) 500 MG tablet Take 1 tablet (500 mg total) by mouth daily. 07/14/12   Sorin  Luanne Bras, MD  fluconazole (DIFLUCAN) 100 MG tablet Take 1 tablet (100 mg total) by mouth daily. 07/14/12   Sorin Luanne Bras, MD  omeprazole (PRILOSEC) 20 MG  capsule Take 1 capsule (20 mg total) by mouth daily. 07/14/12   Sorin Luanne Bras, MD    Physical Exam: Filed Vitals:   02/10/13 1048  BP: 140/87  Pulse: 112  Temp: 98 F (36.7 C)  TempSrc: Oral  Resp: 16  SpO2: 100%    Physical Exam  Constitutional: Appears well-developed and well-nourished. No distress.  HENT: Normocephalic. External right and left ear normal. Oropharynx is dry Eyes: Conjunctivae and EOM are normal. PERRLA, no scleral icterus.  Neck: Normal ROM. Neck supple. No JVD. No tracheal deviation. No thyromegaly.  CVS: Regular rhythm, tachycardic, S1/S2 +, no murmurs, no gallops, no carotid bruit.  Pulmonary: Effort and breath sounds normal, no stridor, rhonchi, wheezes, rales.  Abdominal: Soft. BS +,  no distension, tenderness, rebound or guarding.  Musculoskeletal: Normal range of motion. No edema and no tenderness.  Lymphadenopathy: No lymphadenopathy noted, cervical, inguinal. Neuro: Alert. Normal reflexes, muscle tone coordination. No cranial nerve deficit. Skin: Skin is warm and dry. No rash noted. Not diaphoretic. No erythema. No pallor.  Psychiatric: Normal mood and affect. Behavior, judgment, thought content normal.   Labs on Admission:  Basic Metabolic Panel:  Recent Labs Lab 02/10/13 1135  NA 131*  K 5.7*  CL 95*  CO2 8*  GLUCOSE 317*  BUN 16  CREATININE 0.68  CALCIUM 10.3   Liver Function Tests:  Recent Labs Lab 02/10/13 1135  AST 20  ALT 16  ALKPHOS 84  BILITOT 0.4  PROT 10.0*  ALBUMIN 5.2    Recent Labs Lab 02/10/13 1135  LIPASE 17   CBC:  Recent Labs Lab 02/10/13 1135  WBC 14.8*  NEUTROABS 13.7*  HGB 15.1*  HCT 47.1*  MCV 83.1  PLT 320   Cardiac Enzymes:  Recent Labs Lab 02/10/13 1135  TROPONINI <0.30   CBG:  Recent Labs Lab 02/10/13 1338  GLUCAP 300*    Radiological Exams on Admission: Dg Chest 2 View  02/10/2013   *RADIOLOGY REPORT*  Clinical Data: Weakness, nausea, vomiting and diarrhea  CHEST - 2 VIEW   Comparison: 12/24/2009  Findings: The heart, mediastinum and hila are within normal limits. The lungs are clear.  No pleural effusion or pneumothorax.  The bony thorax and surrounding soft tissues are unremarkable.  IMPRESSION: Normal chest radiographs.   Original Report Authenticated By: Amie Portland, M.D.    EKG: Normal sinus rhythm, no ST/T wave changes  Debbora Presto, MD  [facility] Pager [phone]  If 7PM-7AM, please contact night-coverage [email or web address] Password [facility] 02/10/2013, 2:47 PM

## 2013-02-10 NOTE — ED Notes (Signed)
Patient transported to X-ray 

## 2013-02-10 NOTE — ED Provider Notes (Signed)
History    CSN: [id number] Arrival date & time 02/10/13  1039  First MD Initiated Contact with Patient 02/10/13 1054     Chief Complaint  Patient presents with  . Nausea  . Emesis  . Diarrhea   (Consider location/radiation/quality/duration/timing/severity/associated sxs/prior Treatment) HPI [name] is a(n) 27 y.o. female with a past medical history of insulin-dependent diabetes admission for anion gap metabolic acidosis and DKA one year ago.  Patient presents with chief complaint of chest pain, abdominal pain, nausea, vomiting and diarrhea.  Patient states that over the past 2 weeks he has had intermittent bouts of chest pain associated with thoughts about work.  She states that work has been stressing her out significantly.  The patient complains that yesterday she ate [employer] and became ill.  She complains of diffuse cramping intermittent abdominal pain she's had innumerable nonbilious nonbloody vomit and watery diarrhea. Chest pain is described as intermittent, crushing, lasting for several seconds, worse when sitting up, better when lying flat, no pleurisy. She has had a productive cough over the last several weeks. She complains of myalgia in all 4 limbs. Denies fevers, chills, myalgias, arthralgias. Denies SOB, diaphoresis. Denies dysuria, flank pain, suprapubic pain, frequency, urgency, or hematuria. Denies headaches, light headedness,, visual disturbances.   Past Medical History  Diagnosis Date  . Diabetes mellitus without complication    History reviewed. No pertinent past surgical history. No family history on file. History  Substance Use Topics  . Smoking status: Never Smoker   . Smokeless tobacco: Never Used  . Alcohol Use: Yes   OB History   Grav Para Term Preterm Abortions TAB SAB Ect Mult Living                 Review of Systems Ten systems reviewed and are negative for acute change, except as noted in the HPI.   Allergies  Review of patient's  allergies indicates no known allergies.  Home Medications   Current Outpatient Rx  Name  Route  Sig  Dispense  Refill  . insulin lispro (HUMALOG KWIKPEN) 100 UNIT/ML injection   Subcutaneous   Inject 0-4 Units into the skin 3 (three) times daily before meals. Pt uses sliding scale before meals         . insulin NPH (HUMULIN N,NOVOLIN N) 100 UNIT/ML injection   Subcutaneous   Inject 20 Units into the skin 3 (three) times daily. Pt states she buys this over the counter and she uses 20 units 3 times daily depending on her meal         . azithromycin (ZITHROMAX) 500 MG tablet   Oral   Take 1 tablet (500 mg total) by mouth daily.   3 tablet   0   . fluconazole (DIFLUCAN) 100 MG tablet   Oral   Take 1 tablet (100 mg total) by mouth daily.   10 tablet   0   . omeprazole (PRILOSEC) 20 MG capsule   Oral   Take 1 capsule (20 mg total) by mouth daily.   30 capsule   0    BP 140/87  Pulse 112  Temp(Src) 98 F (36.7 C) (Oral)  Resp 16  SpO2 100%  LMP 01/15/2013 Physical Exam Physical Exam  Nursing note and vitals reviewed. Constitutional: Appears very uncomfortable. Speaking quietly.  Seems to be focused internally.  Speech is interrupted by episodes of chest pain which cause the patient to strain and hold her breath. HENT:  Head: Normocephalic and atraumatic.  Eyes: Conjunctivae normal and EOM are normal. Pupils are equal, round, and reactive to light. No scleral icterus.  Neck: Normal range of motion.  Cardiovascular: Tachycardic, regular rhythm and normal heart sounds.  Exam reveals no gallop and no friction rub.   No murmur heard. Pulmonary/Chest: Effort normal and breath sounds normal. No respiratory distress.  Abdominal: diffuse tenderness, guarding. Neurological: She is alert and oriented to person, place, and time.  Skin: Skin is warm and dry. She is not diaphoretic.    ED Course  Procedures (including critical care time) Labs Reviewed - No data to display No  results found. No diagnosis found.    Date: 02/10/2013  Rate: 117  Rhythm: sinus tachycardia  QRS Axis: left  Intervals: normal  ST/T Wave abnormalities: normal  Conduction Disutrbances:none  Narrative Interpretation:   Old EKG Reviewed: changes noted new left axis deviation   MDM  11:49 AM Patient with  Cp and abdominal sxs, N/V/D- DDX includes DKA, Cp differential includes PE, carditis, anxiety, Gerd/inflammation from vomiting.  Abdominal pain is non-focal, CBG 297, do not suspect PE, more likely food contamination, viral gastroenteritis. No hx of sickle cell. Labs pending.   12:52 PM BP 140/87  Pulse 112  Temp(Src) 98 F (36.7 C) (Oral)  Resp 16  SpO2 100%  LMP 01/15/2013 Patient with anion gap metabolic acidosis Gap is 28 Blood glucose is 317  1:49 PM Patient with compensated metabolic anion gap acidosis.  Patient will need admission.  2:06 PM Patient will be admitted by Dr. Izola Price to stepdown for DKA.   Arthor Captain, PA-C 02/10/13 1408

## 2013-02-11 LAB — GLUCOSE, CAPILLARY
Glucose-Capillary: 105 mg/dL — ABNORMAL HIGH (ref 70–99)
Glucose-Capillary: 108 mg/dL — ABNORMAL HIGH (ref 70–99)
Glucose-Capillary: 119 mg/dL — ABNORMAL HIGH (ref 70–99)
Glucose-Capillary: 126 mg/dL — ABNORMAL HIGH (ref 70–99)
Glucose-Capillary: 145 mg/dL — ABNORMAL HIGH (ref 70–99)
Glucose-Capillary: 156 mg/dL — ABNORMAL HIGH (ref 70–99)
Glucose-Capillary: 176 mg/dL — ABNORMAL HIGH (ref 70–99)
Glucose-Capillary: 187 mg/dL — ABNORMAL HIGH (ref 70–99)
Glucose-Capillary: 189 mg/dL — ABNORMAL HIGH (ref 70–99)
Glucose-Capillary: 205 mg/dL — ABNORMAL HIGH (ref 70–99)
Glucose-Capillary: 225 mg/dL — ABNORMAL HIGH (ref 70–99)
Glucose-Capillary: 231 mg/dL — ABNORMAL HIGH (ref 70–99)

## 2013-02-11 LAB — BASIC METABOLIC PANEL
BUN: 9 mg/dL (ref 6–23)
BUN: 9 mg/dL (ref 6–23)
BUN: 8 mg/dL (ref 6–23)
BUN: 9 mg/dL (ref 6–23)
BUN: 9 mg/dL (ref 6–23)
BUN: 9 mg/dL (ref 6–23)
CO2: 13 mEq/L — ABNORMAL LOW (ref 19–32)
CO2: 11 mEq/L — ABNORMAL LOW (ref 19–32)
CO2: 11 mEq/L — ABNORMAL LOW (ref 19–32)
CO2: 15 mEq/L — ABNORMAL LOW (ref 19–32)
CO2: 16 mEq/L — ABNORMAL LOW (ref 19–32)
CO2: 18 mEq/L — ABNORMAL LOW (ref 19–32)
Calcium: 8.9 mg/dL (ref 8.4–10.5)
Calcium: 8.7 mg/dL (ref 8.4–10.5)
Calcium: 8.9 mg/dL (ref 8.4–10.5)
Calcium: 9.1 mg/dL (ref 8.4–10.5)
Calcium: 9 mg/dL (ref 8.4–10.5)
Calcium: 9.2 mg/dL (ref 8.4–10.5)
Chloride: 105 mEq/L (ref 96–112)
Chloride: 103 mEq/L (ref 96–112)
Chloride: 103 mEq/L (ref 96–112)
Chloride: 104 mEq/L (ref 96–112)
Chloride: 105 mEq/L (ref 96–112)
Chloride: 104 mEq/L (ref 96–112)
Creatinine, Ser: 0.61 mg/dL (ref 0.50–1.10)
Creatinine, Ser: 0.64 mg/dL (ref 0.50–1.10)
Creatinine, Ser: 0.65 mg/dL (ref 0.50–1.10)
Creatinine, Ser: 0.64 mg/dL (ref 0.50–1.10)
Creatinine, Ser: 0.59 mg/dL (ref 0.50–1.10)
Creatinine, Ser: 0.54 mg/dL (ref 0.50–1.10)
GFR calc Af Amer: >90 mL/min (ref >90)
GFR calc Af Amer: >90 mL/min (ref >90)
GFR calc Af Amer: >90 mL/min (ref >90)
GFR calc Af Amer: >90 mL/min (ref >90)
GFR calc Af Amer: >90 mL/min (ref >90)
GFR calc Af Amer: >90 mL/min (ref >90)
GFR calc non Af Amer: >90 mL/min (ref >90)
GFR calc non Af Amer: >90 mL/min (ref >90)
GFR calc non Af Amer: >90 mL/min (ref >90)
GFR calc non Af Amer: >90 mL/min (ref >90)
GFR calc non Af Amer: >90 mL/min (ref >90)
GFR calc non Af Amer: >90 mL/min (ref >90)
Glucose, Bld: 146 mg/dL — ABNORMAL HIGH (ref 70–99)
Glucose, Bld: 214 mg/dL — ABNORMAL HIGH (ref 70–99)
Glucose, Bld: 248 mg/dL — ABNORMAL HIGH (ref 70–99)
Glucose, Bld: 206 mg/dL — ABNORMAL HIGH (ref 70–99)
Glucose, Bld: 134 mg/dL — ABNORMAL HIGH (ref 70–99)
Glucose, Bld: 131 mg/dL — ABNORMAL HIGH (ref 70–99)
Potassium: 4.1 mEq/L (ref 3.5–5.1)
Potassium: 4.2 mEq/L (ref 3.5–5.1)
Potassium: 4.2 mEq/L (ref 3.5–5.1)
Potassium: 3.7 mEq/L (ref 3.5–5.1)
Potassium: 3.5 mEq/L (ref 3.5–5.1)
Potassium: 4.5 mEq/L (ref 3.5–5.1)
Sodium: 132 mEq/L — ABNORMAL LOW (ref 135–145)
Sodium: 131 mEq/L — ABNORMAL LOW (ref 135–145)
Sodium: 132 mEq/L — ABNORMAL LOW (ref 135–145)
Sodium: 133 mEq/L — ABNORMAL LOW (ref 135–145)
Sodium: 132 mEq/L — ABNORMAL LOW (ref 135–145)
Sodium: 135 mEq/L (ref 135–145)

## 2013-02-11 MED ORDER — ONDANSETRON HCL 4 MG/2ML IJ SOLN
4.0000 mg | Freq: Four times a day (QID) | INTRAMUSCULAR | Status: DC | PRN
  Administered 2013-02-11: 4 mg via INTRAVENOUS

## 2013-02-11 MED ORDER — INSULIN ASPART 100 UNIT/ML ~~LOC~~ SOLN: 0.0000 [IU] | Freq: Three times a day (TID) | SUBCUTANEOUS | Status: DC

## 2013-02-11 MED ORDER — ALPRAZOLAM 0.25 MG PO TABS
0.2500 mg | ORAL_TABLET | Freq: Three times a day (TID) | ORAL | Status: DC | PRN
  Administered 2013-02-11 – 2013-02-12 (×3): 0.25 mg via ORAL
  Filled 2013-02-11 (×3): qty 1

## 2013-02-11 MED ORDER — ONDANSETRON HCL 4 MG/2ML IJ SOLN
INTRAMUSCULAR | Status: AC
  Filled 2013-02-11: qty 2

## 2013-02-11 MED ORDER — INSULIN NPH (HUMAN) (ISOPHANE) 100 UNIT/ML ~~LOC~~ SUSP
20.0000 [IU] | Freq: Two times a day (BID) | SUBCUTANEOUS | Status: DC
  Administered 2013-02-11 – 2013-02-12 (×3): 20 [IU] via SUBCUTANEOUS
  Filled 2013-02-11: qty 10

## 2013-02-11 MED ORDER — INSULIN ASPART 100 UNIT/ML ~~LOC~~ SOLN
0.0000 [IU] | Freq: Three times a day (TID) | SUBCUTANEOUS | Status: DC
  Administered 2013-02-11: 2 [IU] via SUBCUTANEOUS

## 2013-02-11 NOTE — Progress Notes (Signed)
   CARE MANAGEMENT NOTE 02/11/2013  Patient:  [name], [name]   Account Number:  [id number]  Date Initiated:  02/11/2013  Documentation initiated by:  Jiles Crocker  Subjective/Objective Assessment:   ADMITTED WITH DKA (diabetic ketoacidoses)     Action/Plan:   PCP: Junious Silk, MD  LIVES AT HOME WITH FAMILY MEMBERS; CM FOLLOWING FOR DCP   Anticipated DC Date:  02/15/2013   Anticipated DC Plan:  HOME/SELF CARE  In-house referral  Financial Counselor      DC Planning Services  CM consult          Status of service:  In process, will continue to follow Medicare Important Message given?  NA - LOS <3 / Initial given by admissions (If response is "NO", the following Medicare IM given date fields will be blank)  Per UR Regulation:  Reviewed for med. necessity/level of care/duration of stay  Comments:  02/11/2013- B Aditya Nastasi RN,BSN,MHA

## 2013-02-11 NOTE — Progress Notes (Signed)
[facility] PROGRESS NOTE  MISHAYLA SLIWINSKI ZOX:[id number] DOB: Sep 04, 1985 DOA: 02/10/2013 PCP: Junious Silk, MD  Assessment/Plan:  DKA (diabetic ketoacidoses)  - Resolved, now off insulin infusion - Will start her on NPH insulin. - sliding scale Insulin.  - AG 13   Leukocytosis  - no clear infectious source identified and this is possibly related to demargination and stress  - will monitor closely   Hyperkalemia  - repeat BMP with normal potassium, close monitoring     Code Status: Full code Family Communication: Discussed with patient Disposition Plan: Home when stable   Consultants:  None  Procedures:  None  Antibiotics:  None  HPI/Subjective: Patient seen and examined, admitted with DKA , blood glucose is now improving.  Objective: Filed Vitals:   02/11/13 0000 02/11/13 0400 02/11/13 0800 02/11/13 1200  BP: 109/66 127/73 104/72 104/71  Pulse: 108 106 99 94  Temp: 99 F (37.2 C) 98.6 F (37 C)  97.8 F (36.6 C)  TempSrc: Oral Oral  Oral  Resp: 14 18 14 23   Height:      Weight:      SpO2: 100% 100% 100% 100%    Intake/Output Summary (Last 24 hours) at 02/11/13 1423 Last data filed at 02/11/13 1400  Gross per 24 hour  Intake 2935.49 ml  Output   3000 ml  Net -64.51 ml   Filed Weights   02/10/13 1600  Weight: 63 kg (138 lb 14.2 oz)    Exam:   General:  Appear  In no acute distress  Cardiovascular: s1s2 RRR  Respiratory: Clear bilaterally  Abdomen: Soft, nontender  Musculoskeletal: No edema   Data Reviewed: Basic Metabolic Panel:  Recent Labs Lab 02/11/13 0230 02/11/13 0400 02/11/13 0550 02/11/13 0800 02/11/13 1145  NA 131* 132* 133* 132* 135  K 4.2 4.2 3.7 3.5 4.5  CL 103 103 104 105 104  CO2 11* 11* 15* 16* 18*  GLUCOSE 214* 248* 206* 134* 131*  BUN 9 8 9 9 9   CREATININE 0.64 0.65 0.64 0.59 0.54  CALCIUM 8.7 8.9 9.1 9.0 9.2   Liver Function Tests:  Recent Labs Lab 02/10/13 1135  AST 20  ALT 16   ALKPHOS 84  BILITOT 0.4  PROT 10.0*  ALBUMIN 5.2    Recent Labs Lab 02/10/13 1135  LIPASE 17   No results found for this basename: AMMONIA,  in the last 168 hours CBC:  Recent Labs Lab 02/10/13 1135 02/10/13 1516  WBC 14.8* 17.9*  NEUTROABS 13.7*  --   HGB 15.1* 12.8  HCT 47.1* 38.7  MCV 83.1 81.6  PLT 320 320   Cardiac Enzymes:  Recent Labs Lab 02/10/13 1135  TROPONINI <0.30   BNP (last 3 results) No results found for this basename: PROBNP,  in the last 8760 hours CBG:  Recent Labs Lab 02/11/13 0607 02/11/13 0655 02/11/13 0819 02/11/13 0904 02/11/13 1138  GLUCAP 189* 145* 119* 108* 105*    Recent Results (from the past 240 hour(s))  MRSA PCR SCREENING     Status: None   Collection Time    02/10/13  3:19 PM      Result Value Range Status   MRSA by PCR NEGATIVE  NEGATIVE Final   Comment:            The GeneXpert MRSA Assay (FDA     approved for NASAL specimens     only), is one component of a     comprehensive MRSA colonization     surveillance  program. It is not     intended to diagnose MRSA     infection nor to guide or     monitor treatment for     MRSA infections.     Studies: Dg Chest 2 View  02/10/2013   *RADIOLOGY REPORT*  Clinical Data: Weakness, nausea, vomiting and diarrhea  CHEST - 2 VIEW  Comparison: 12/24/2009  Findings: The heart, mediastinum and hila are within normal limits. The lungs are clear.  No pleural effusion or pneumothorax.  The bony thorax and surrounding soft tissues are unremarkable.  IMPRESSION: Normal chest radiographs.   Original Report Authenticated By: Amie Portland, M.D.    Scheduled Meds: . enoxaparin (LOVENOX) injection  40 mg Subcutaneous Q24H  . insulin NPH  20 Units Subcutaneous BID AC & HS  . ondansetron       Continuous Infusions: . sodium chloride    . dextrose 5 % and 0.45% NaCl 75 mL/hr at 02/11/13 0609  . insulin (NOVOLIN-R) infusion 3.4 Units/hr (02/11/13 0700)    Principal Problem:   DKA  (diabetic ketoacidoses) Active Problems:   Leukocytosis   Hyperkalemia   High anion gap metabolic acidosis    Time spent: 30 min    [facility]  [facility] Pager [phone]. If 7PM-7AM, please contact night-coverage at [email or web address], password [email or web address] 02/11/2013, 2:23 PM  LOS: 1 day

## 2013-02-11 NOTE — Progress Notes (Signed)
Inpatient Diabetes Program Recommendations  AACE/ADA: New Consensus Statement on Inpatient Glycemic Control (2013)  Target Ranges:  Prepandial:   less than 140 mg/dL      Peak postprandial:   less than 180 mg/dL (1-2 hours)      Critically ill patients:  140 - 180 mg/dL  Results for [name], [name] (MRN [id number]) as of 02/11/2013 12:36  Ref. Range 02/10/2013 11:35 02/10/2013 18:12 02/10/2013 20:04 02/10/2013 22:40  Glucose Latest Range: 70-99 mg/dL 409 (H) 811 (H) 914 (H) 126 (H)   Results for [name], [name] (MRN [id number]) as of 02/11/2013 12:36  Ref. Range 02/11/2013 01:02 02/11/2013 02:16 02/11/2013 03:06 02/11/2013 03:59 02/11/2013 05:18 02/11/2013 06:07 02/11/2013 06:55 02/11/2013 08:19 02/11/2013 09:04 02/11/2013 11:38  Glucose-Capillary Latest Range: 70-99 mg/dL 086 (H) 578 (H) 469 (H) 225 (H) 231 (H) 189 (H) 145 (H) 119 (H) 108 (H) 105 (H)   Inpatient Diabetes Program Recommendations IV fluids: Please re-evaluate need for dextrose in IVF. Correction (SSI): Please order CBGs ACHS with Novolog sensitive correction. HgbA1C: Please order an A1C to determine glycemic control over the past 2-3 months.  Note: Patient has a history of diabetes and according to the home medication list she takes NPH 20 units TID and Humalog 0-4 units TID at home for diabetes management.  Currently, patient is ordered to receive NPH 20 units BID for inpatient glycemic control.  Initial blood glucose was 317 on 7/17 @ 11:35 and patient was started on an insulin drip for DKA.  Patient has now transitioned to SQ insulin and was given NPH 20 units at 8:58 am.  Please re-evaluate need for dextrose in IVF, order CBGs ACHS with Novolog sensitive correction, and order an A1C.  Will continue to follow.  Thanks, Orlando Penner, RN, MSN, CCRN Diabetes Coordinator Inpatient Diabetes Program [phone]

## 2013-02-12 DIAGNOSIS — D72829 Elevated white blood cell count, unspecified: Secondary | ICD-10-CM

## 2013-02-12 LAB — CBC WITH DIFFERENTIAL/PLATELET
Basophils Absolute: 0 10*3/uL (ref 0.0–0.1)
Basophils Absolute: 0.1 10*3/uL (ref 0.0–0.1)
Basophils Relative: 1 % (ref 0–1)
Basophils Relative: 1 % (ref 0–1)
Eosinophils Absolute: 0.1 10*3/uL (ref 0.0–0.7)
Eosinophils Absolute: 0.1 10*3/uL (ref 0.0–0.7)
Eosinophils Relative: 3 % (ref 0–5)
Eosinophils Relative: 1 % (ref 0–5)
HCT: 35.8 % — ABNORMAL LOW (ref 36.0–46.0)
HCT: 33 % — ABNORMAL LOW (ref 36.0–46.0)
Hemoglobin: 12.1 g/dL (ref 12.0–15.0)
Hemoglobin: 11.3 g/dL — ABNORMAL LOW (ref 12.0–15.0)
Lymphocytes Relative: 45 % (ref 12–46)
Lymphocytes Relative: 42 % (ref 12–46)
Lymphs Abs: 1.6 10*3/uL (ref 0.7–4.0)
Lymphs Abs: 1.8 10*3/uL (ref 0.7–4.0)
MCH: 26.7 pg (ref 26.0–34.0)
MCH: 26.8 pg (ref 26.0–34.0)
MCHC: 33.8 g/dL (ref 30.0–36.0)
MCHC: 34.2 g/dL (ref 30.0–36.0)
MCV: 79 fL (ref 78.0–100.0)
MCV: 78.4 fL (ref 78.0–100.0)
Monocytes Absolute: 0.5 10*3/uL (ref 0.1–1.0)
Monocytes Absolute: 0.6 10*3/uL (ref 0.1–1.0)
Monocytes Relative: 14 % — ABNORMAL HIGH (ref 3–12)
Monocytes Relative: 15 % — ABNORMAL HIGH (ref 3–12)
Neutro Abs: 1.3 10*3/uL — ABNORMAL LOW (ref 1.7–7.7)
Neutro Abs: 1.8 10*3/uL (ref 1.7–7.7)
Neutrophils Relative %: 38 % — ABNORMAL LOW (ref 43–77)
Neutrophils Relative %: 41 % — ABNORMAL LOW (ref 43–77)
Platelets: 289 10*3/uL (ref 150–400)
Platelets: 290 10*3/uL (ref 150–400)
RBC: 4.53 MIL/uL (ref 3.87–5.11)
RBC: 4.21 MIL/uL (ref 3.87–5.11)
RDW: 15 % (ref 11.5–15.5)
RDW: 15 % (ref 11.5–15.5)
WBC: 3.5 10*3/uL — ABNORMAL LOW (ref 4.0–10.5)
WBC: 4.3 10*3/uL (ref 4.0–10.5)

## 2013-02-12 LAB — BASIC METABOLIC PANEL
BUN: 10 mg/dL (ref 6–23)
CO2: 21 mEq/L (ref 19–32)
Calcium: 9.2 mg/dL (ref 8.4–10.5)
Chloride: 102 mEq/L (ref 96–112)
Creatinine, Ser: 0.55 mg/dL (ref 0.50–1.10)
GFR calc Af Amer: >90 mL/min (ref >90)
GFR calc non Af Amer: >90 mL/min (ref >90)
Glucose, Bld: 148 mg/dL — ABNORMAL HIGH (ref 70–99)
Potassium: 3.3 mEq/L — ABNORMAL LOW (ref 3.5–5.1)
Sodium: 135 mEq/L (ref 135–145)

## 2013-02-12 LAB — GLUCOSE, CAPILLARY
Glucose-Capillary: 110 mg/dL — ABNORMAL HIGH (ref 70–99)
Glucose-Capillary: 246 mg/dL — ABNORMAL HIGH (ref 70–99)
Glucose-Capillary: 310 mg/dL — ABNORMAL HIGH (ref 70–99)
Glucose-Capillary: 129 mg/dL — ABNORMAL HIGH (ref 70–99)
Glucose-Capillary: 209 mg/dL — ABNORMAL HIGH (ref 70–99)

## 2013-02-12 MED ORDER — ACETAMINOPHEN 325 MG PO TABS
650.0000 mg | ORAL_TABLET | ORAL | Status: DC | PRN
  Administered 2013-02-12: 650 mg via ORAL
  Filled 2013-02-12: qty 2

## 2013-02-12 MED ORDER — ALPRAZOLAM 0.25 MG PO TABS: 0.2500 mg | ORAL_TABLET | Freq: Three times a day (TID) | ORAL | Status: DC | PRN

## 2013-02-12 MED ORDER — INSULIN ASPART 100 UNIT/ML ~~LOC~~ SOLN
0.0000 [IU] | SUBCUTANEOUS | Status: DC
Start: 2013-02-12 — End: 2013-02-12
  Administered 2013-02-12: 08:00:00 via SUBCUTANEOUS
  Administered 2013-02-12: 7 [IU] via SUBCUTANEOUS
  Administered 2013-02-12: 3 [IU] via SUBCUTANEOUS
  Administered 2013-02-12: 5 [IU] via SUBCUTANEOUS

## 2013-02-12 MED ORDER — POTASSIUM CHLORIDE CRYS ER 20 MEQ PO TBCR
40.0000 meq | EXTENDED_RELEASE_TABLET | ORAL | Status: AC
  Administered 2013-02-12 (×2): 40 meq via ORAL
  Filled 2013-02-12 (×2): qty 2

## 2013-02-12 NOTE — Discharge Summary (Signed)
Physician Discharge Summary  [name] WGN:[id number] DOB: 1986-06-19 DOA: 02/10/2013  PCP: Junious Silk, MD  Admit date: 02/10/2013 Discharge date: 02/12/2013  Time spent: 50* minutes  Recommendations for Outpatient Follow-up:  Follow up PCP in 2 weeks Discharge Diagnoses:  Principal Problem:   DKA (diabetic ketoacidoses) Active Problems:   Leukocytosis   Hyperkalemia   High anion gap metabolic acidosis   Discharge Condition: Stable  Diet recommendation: Diabetic diet  Filed Weights   02/10/13 1600  Weight: 63 kg (138 lb 14.2 oz)    History of present illness:  27 y.o. female with a past medical history of insulin-dependent diabetes admission for DKA one year ago. Patient presented to [facility] ED with main concern of progressively worsening generalized abdominal pain that started 1-2 weeks prior to admission and associated with nausea and non bloody vomiting, poor oral intake, intermittent and sharp, 10/10 in severity when present, radiating to lower back area occasionally. She reports similar event in the past that was due to very high sugar level. She denies any fevers, chills, no specific urinary concerns, no shortness of breath but endorses intermittent episodes of chest discomfort that occurs with abdominal pain.    Hospital Course:  DKA (diabetic ketoacidoses)  - Resolved, now off insulin infusion  - started back on  NPH insulin.  - Will discharge her home on NPH and Novolog meal coverage. Leukocytosis  - no clear infectious source identified and this is possibly related to demargination and stress  - will repeat CBC today.  Hypokalemia- - Will replace potassium before discharge.  Anxiety Xanax 0.25 mg po q 8 hr prn will be prescribed Will benefit from SSRI Will discuss with PCP    Procedures:  None  Consultations:  None  Discharge Exam: Filed Vitals:   02/12/13 0000 02/12/13 0400 02/12/13 0800 02/12/13 1200  BP: 125/85 98/65    Pulse: 103 96     Temp: 98.2 F (36.8 C) 97.9 F (36.6 C) 98 F (36.7 C) 98.4 F (36.9 C)  TempSrc: Oral Oral Oral Oral  Resp:  14    Height:      Weight:      SpO2:  100%      General: *appear in no acute distress Cardiovascular: *S1s2 RRR Respiratory: *Clear bilaterally Ext : no edema  Discharge Instructions  Discharge Orders   Future Orders Complete By Expires     Diet Carb Modified  As directed     Increase activity slowly  As directed         Medication List    STOP taking these medications       azithromycin 500 MG tablet  Commonly known as:  ZITHROMAX     fluconazole 100 MG tablet  Commonly known as:  DIFLUCAN      TAKE these medications       ALPRAZolam 0.25 MG tablet  Commonly known as:  XANAX  Take 1 tablet (0.25 mg total) by mouth every 8 (eight) hours as needed for anxiety.     HUMALOG KWIKPEN 100 UNIT/ML injection  Generic drug:  insulin lispro  Inject 0-4 Units into the skin 3 (three) times daily before meals. Pt uses sliding scale before meals     insulin NPH 100 UNIT/ML injection  Commonly known as:  HUMULIN N,NOVOLIN N  Inject 20 Units into the skin 3 (three) times daily. Pt states she buys this over the counter and she uses 20 units 3 times daily depending on her meal  omeprazole 20 MG capsule  Commonly known as:  PRILOSEC  Take 1 capsule (20 mg total) by mouth daily.       No Known Allergies    The results of significant diagnostics from this hospitalization (including imaging, microbiology, ancillary and laboratory) are listed below for reference.    Significant Diagnostic Studies: Dg Chest 2 View  02/10/2013   *RADIOLOGY REPORT*  Clinical Data: Weakness, nausea, vomiting and diarrhea  CHEST - 2 VIEW  Comparison: 12/24/2009  Findings: The heart, mediastinum and hila are within normal limits. The lungs are clear.  No pleural effusion or pneumothorax.  The bony thorax and surrounding soft tissues are unremarkable.  IMPRESSION: Normal chest  radiographs.   Original Report Authenticated By: Amie Portland, M.D.    Microbiology: Recent Results (from the past 240 hour(s))  MRSA PCR SCREENING     Status: None   Collection Time    02/10/13  3:19 PM      Result Value Range Status   MRSA by PCR NEGATIVE  NEGATIVE Final   Comment:            The GeneXpert MRSA Assay (FDA     approved for NASAL specimens     only), is one component of a     comprehensive MRSA colonization     surveillance program. It is not     intended to diagnose MRSA     infection nor to guide or     monitor treatment for     MRSA infections.     Labs: Basic Metabolic Panel:  Recent Labs Lab 02/11/13 0400 02/11/13 0550 02/11/13 0800 02/11/13 1145 02/12/13 0834  NA 132* 133* 132* 135 135  K 4.2 3.7 3.5 4.5 3.3*  CL 103 104 105 104 102  CO2 11* 15* 16* 18* 21  GLUCOSE 248* 206* 134* 131* 148*  BUN 8 9 9 9 10   CREATININE 0.65 0.64 0.59 0.54 0.55  CALCIUM 8.9 9.1 9.0 9.2 9.2   Liver Function Tests:  Recent Labs Lab 02/10/13 1135  AST 20  ALT 16  ALKPHOS 84  BILITOT 0.4  PROT 10.0*  ALBUMIN 5.2    Recent Labs Lab 02/10/13 1135  LIPASE 17   No results found for this basename: AMMONIA,  in the last 168 hours CBC:  Recent Labs Lab 02/10/13 1135 02/10/13 1516  WBC 14.8* 17.9*  NEUTROABS 13.7*  --   HGB 15.1* 12.8  HCT 47.1* 38.7  MCV 83.1 81.6  PLT 320 320   Cardiac Enzymes:  Recent Labs Lab 02/10/13 1135  TROPONINI <0.30   BNP: BNP (last 3 results) No results found for this basename: PROBNP,  in the last 8760 hours CBG:  Recent Labs Lab 02/11/13 2200 02/11/13 2333 02/12/13 0312 02/12/13 0743 02/12/13 1125  GLUCAP 246* 310* 110* 129* 209*       Signed:  LAMA,GAGAN S  [facility] 02/12/2013, 1:27 PM

## 2013-02-12 NOTE — Progress Notes (Addendum)
Repeat CBC shows normal WBC 4.3, will discharge home.

## 2013-02-12 NOTE — Progress Notes (Signed)
Shelbylynn Mao 1238 D/C'D HOME- GIVEN D/C INSTRUCTIONS WITH UNDERSTANDING AND GIVEN PRESCRIPTION FOR XANAX.

## 2013-02-12 NOTE — Progress Notes (Signed)
CBG at 2200 02/11/2013 = 246 and at midnight 310.  Midlevel notified orders received and given.

## 2013-02-14 LAB — GLUCOSE, CAPILLARY: Glucose-Capillary: 276 mg/dL — ABNORMAL HIGH (ref 70–99)

## 2013-09-20 ENCOUNTER — Ambulatory Visit: Payer: Self-pay

## 2013-11-13 ENCOUNTER — Emergency Department (INDEPENDENT_AMBULATORY_CARE_PROVIDER_SITE_OTHER)
Admission: EM | Admit: 2013-11-13 | Discharge: 2013-11-13 | Disposition: A | Discharge status: Home / Self Care | Payer: Self-pay | Source: Home / Self Care | Attending: Emergency Medicine | Admitting: Emergency Medicine

## 2013-11-13 ENCOUNTER — Emergency Department (INDEPENDENT_AMBULATORY_CARE_PROVIDER_SITE_OTHER): Payer: Self-pay

## 2013-11-13 ENCOUNTER — Encounter (HOSPITAL_COMMUNITY): Payer: Self-pay | Admitting: Emergency Medicine

## 2013-11-13 DIAGNOSIS — M779 Enthesopathy, unspecified: Secondary | ICD-10-CM

## 2013-11-13 MED ORDER — MELOXICAM 15 MG PO TABS: 15.0000 mg | ORAL_TABLET | Freq: Every day | ORAL | Status: DC

## 2013-11-13 NOTE — ED Provider Notes (Signed)
  Chief Complaint   Chief Complaint  Patient presents with  . Hand Pain    History of Present Illness   [name] is a 28 year old female with type 1 diabetes had pain and swelling of her right index finger PIP joint for the last 2 weeks. She denies any injury. The joint has a full range of motion but it does hurt to bend. She's able to fully straighten. She denies any other joint pains.  Review of Systems   Other than as noted above, the patient denies any of the following symptoms: Systemic:  No fevers or chills. Musculoskeletal:  No joint pain or arthritis.  Neurological:  No muscular weakness or paresthesias.  [place]   Past medical history, family history, social history, meds, and allergies were reviewed.     Physical Examination   Vital signs:  BP 128/98  Pulse 86  Temp(Src) 98.4 F (36.9 C) (Oral)  Resp 16  SpO2 100%  LMP 10/28/2013 Gen:  Alert and oriented times 3.  In no distress. Musculoskeletal:  Exam of the hand reveals slight swelling overlying the PIP joint of the right index finger. There is pain to palpation mostly on the volar aspect. There was no triggering of the finger. The joints have full range of motion.  Otherwise, all joints had a full a ROM with no swelling, bruising or deformity.  No edema, pulses full. Extremities were warm and pink.  Capillary refill was brisk.  Skin:  Clear, warm and dry.  No rash. Neuro:  Alert and oriented times 3.  Muscle strength was normal.  Sensation was intact to light touch.   Radiology   Dg Finger Index Right  11/13/2013   CLINICAL DATA:  Pain for 2 weeks  EXAM: RIGHT INDEX FINGER 2+V  COMPARISON:  None.  FINDINGS: There is no evidence of fracture or dislocation. There is no evidence of arthropathy or other focal bone abnormality. Soft tissues are unremarkable.  IMPRESSION: Negative.   Electronically Signed   By: Rolla Flatten M.D.   On: 11/13/2013 10:12   I reviewed the images independently and personally and  concur with the radiologist's findings.  Course in Urgent Care Center   The finger was splinted in position of function.  Assessment   The encounter diagnosis was Tendonitis.  Possibly secondary to her diabetes.  Plan  1.  Meds:  The following meds were prescribed:   Discharge Medication List as of 11/13/2013 10:25 AM    START taking these medications   Details  meloxicam (MOBIC) 15 MG tablet Take 1 tablet (15 mg total) by mouth daily., Starting 11/13/2013, Until Discontinued, Normal        2.  Patient Education/Counseling:  The patient was given appropriate handouts, self care instructions, and instructed in symptomatic relief, including rest and activity, and elevation.   3.  Follow up:  The patient was told to follow up here if no better in 3 to 4 days, or sooner if becoming worse in any way, and given some red flag symptoms such as worsening pain, fever, swelling, or neurological symptoms which would prompt immediate return.  Followup with Dr. Milly Jakob in 2 weeks if no improvement.      Harden Mo, MD 11/13/13 2107

## 2013-11-13 NOTE — ED Notes (Signed)
Pain in right index finger PIP joint x 2 weeks. Unknown injury, has tried to POP it back into place w/o success

## 2013-11-13 NOTE — Discharge Instructions (Signed)
Tendinitis °Tendinitis is swelling and inflammation of the tendons. Tendons are band-like tissues that connect muscle to bone. Tendinitis commonly occurs in the:  °· Shoulders (rotator cuff). °· Heels (Achilles tendon). °· Elbows (triceps tendon). °CAUSES °Tendinitis is usually caused by overusing the tendon, muscles, and joints involved. When the tissue surrounding a tendon (synovium) becomes inflamed, it is called tenosynovitis. Tendinitis commonly develops in people whose jobs require repetitive motions. °SYMPTOMS °· Pain. °· Tenderness. °· Mild swelling. °DIAGNOSIS °Tendinitis is usually diagnosed by physical exam. Your caregiver may also order X-rays or other imaging tests. °TREATMENT °Your caregiver may recommend certain medicines or exercises for your treatment. °HOME CARE INSTRUCTIONS  °· Use a sling or splint for as long as directed by your caregiver until the pain decreases. °· Put ice on the injured area. °· Put ice in a plastic bag. °· Place a towel between your skin and the bag. °· Leave the ice on for 15-20 minutes, 03-04 times a day. °· Avoid using the limb while the tendon is painful. Perform gentle range of motion exercises only as directed by your caregiver. Stop exercises if pain or discomfort increase, unless directed otherwise by your caregiver. °· Only take over-the-counter or prescription medicines for pain, discomfort, or fever as directed by your caregiver. °SEEK MEDICAL CARE IF:  °· Your pain and swelling increase. °· You develop new, unexplained symptoms, especially increased numbness in the hands. °MAKE SURE YOU:  °· Understand these instructions. °· Will watch your condition. °· Will get help right away if you are not doing well or get worse. °Document Released: 07/11/2000 Document Revised: 10/06/2011 Document Reviewed: 09/30/2010 °[employer]® Patient Information ©2014 [employer]. ° °

## 2013-12-22 ENCOUNTER — Ambulatory Visit: Payer: Self-pay | Admitting: Endocrinology

## 2013-12-26 ENCOUNTER — Ambulatory Visit: Payer: Self-pay | Admitting: Endocrinology

## 2014-01-18 ENCOUNTER — Ambulatory Visit: Payer: 59 | Admitting: *Deleted

## 2014-01-25 ENCOUNTER — Encounter: Payer: 59 | Attending: Endocrinology | Admitting: Nutrition

## 2014-01-25 DIAGNOSIS — E109 Type 1 diabetes mellitus without complications: Secondary | ICD-10-CM | POA: Diagnosis present

## 2014-01-25 DIAGNOSIS — Z713 Dietary counseling and surveillance: Secondary | ICD-10-CM | POA: Diagnosis not present

## 2014-01-25 NOTE — Progress Notes (Signed)
This patient was instructed on how to carb count.  We reviewed what carbohyrates were, and then how to count them.  She was given several handouts/brochures on how to measure, and lists of 15 gram portions of carbohydrates.   She reported good understanding of this, and had no questions.  She was shown different plates with carbs on them, and she was able to identify what the carbs were, and add them up appropriately with little assistance from me.  She was given a Calorie [employer] Pace book for eating out and shown how to read it and add up carbs at restaurants where she goes.  She had no final questions.

## 2014-01-25 NOTE — Patient Instructions (Signed)
Read over handouts given on carb counting and call if questions.

## 2014-02-20 ENCOUNTER — Ambulatory Visit: Payer: 59 | Admitting: *Deleted

## 2014-04-10 ENCOUNTER — Emergency Department (HOSPITAL_COMMUNITY): Payer: No Typology Code available for payment source

## 2014-04-10 ENCOUNTER — Encounter (HOSPITAL_COMMUNITY): Payer: Self-pay | Admitting: Emergency Medicine

## 2014-04-10 ENCOUNTER — Emergency Department (HOSPITAL_COMMUNITY)
Admission: EM | Admit: 2014-04-10 | Discharge: 2014-04-11 | Disposition: A | Discharge status: Home / Self Care | Payer: No Typology Code available for payment source | Attending: Emergency Medicine | Admitting: Emergency Medicine

## 2014-04-10 DIAGNOSIS — S335XXA Sprain of ligaments of lumbar spine, initial encounter: Secondary | ICD-10-CM | POA: Diagnosis not present

## 2014-04-10 DIAGNOSIS — Z3202 Encounter for pregnancy test, result negative: Secondary | ICD-10-CM | POA: Insufficient documentation

## 2014-04-10 DIAGNOSIS — S139XXA Sprain of joints and ligaments of unspecified parts of neck, initial encounter: Secondary | ICD-10-CM | POA: Diagnosis not present

## 2014-04-10 DIAGNOSIS — Y92410 Unspecified street and highway as the place of occurrence of the external cause: Secondary | ICD-10-CM | POA: Insufficient documentation

## 2014-04-10 DIAGNOSIS — S4980XA Other specified injuries of shoulder and upper arm, unspecified arm, initial encounter: Secondary | ICD-10-CM | POA: Insufficient documentation

## 2014-04-10 DIAGNOSIS — S0993XA Unspecified injury of face, initial encounter: Secondary | ICD-10-CM | POA: Insufficient documentation

## 2014-04-10 DIAGNOSIS — S46909A Unspecified injury of unspecified muscle, fascia and tendon at shoulder and upper arm level, unspecified arm, initial encounter: Secondary | ICD-10-CM | POA: Insufficient documentation

## 2014-04-10 DIAGNOSIS — S3981XA Other specified injuries of abdomen, initial encounter: Secondary | ICD-10-CM | POA: Diagnosis not present

## 2014-04-10 DIAGNOSIS — E119 Type 2 diabetes mellitus without complications: Secondary | ICD-10-CM | POA: Insufficient documentation

## 2014-04-10 DIAGNOSIS — Z794 Long term (current) use of insulin: Secondary | ICD-10-CM | POA: Insufficient documentation

## 2014-04-10 DIAGNOSIS — S298XXA Other specified injuries of thorax, initial encounter: Secondary | ICD-10-CM | POA: Insufficient documentation

## 2014-04-10 DIAGNOSIS — S161XXA Strain of muscle, fascia and tendon at neck level, initial encounter: Secondary | ICD-10-CM

## 2014-04-10 DIAGNOSIS — S199XXA Unspecified injury of neck, initial encounter: Secondary | ICD-10-CM

## 2014-04-10 DIAGNOSIS — Y9389 Activity, other specified: Secondary | ICD-10-CM | POA: Insufficient documentation

## 2014-04-10 DIAGNOSIS — S39012A Strain of muscle, fascia and tendon of lower back, initial encounter: Secondary | ICD-10-CM

## 2014-04-10 DIAGNOSIS — Z79899 Other long term (current) drug therapy: Secondary | ICD-10-CM | POA: Diagnosis not present
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
## 2014-04-11 ENCOUNTER — Emergency Department (HOSPITAL_COMMUNITY): Payer: No Typology Code available for payment source

## 2014-04-11 ENCOUNTER — Encounter (HOSPITAL_COMMUNITY): Payer: Self-pay

## 2014-04-11 DIAGNOSIS — S139XXA Sprain of joints and ligaments of unspecified parts of neck, initial encounter: Secondary | ICD-10-CM | POA: Diagnosis not present

## 2014-04-11 LAB — RAPID URINE DRUG SCREEN, HOSP PERFORMED
Amphetamines: NOT DETECTED
Barbiturates: NOT DETECTED
Benzodiazepines: NOT DETECTED
Cocaine: NOT DETECTED
Opiates: POSITIVE — AB
Tetrahydrocannabinol: NOT DETECTED

## 2014-04-11 LAB — URINALYSIS, ROUTINE W REFLEX MICROSCOPIC
Bilirubin Urine: NEGATIVE
Glucose, UA: >1000 mg/dL — AB
Hgb urine dipstick: NEGATIVE
Ketones, ur: NEGATIVE mg/dL
Leukocytes, UA: NEGATIVE
Nitrite: NEGATIVE
Protein, ur: NEGATIVE mg/dL
Specific Gravity, Urine: >1.046 — ABNORMAL HIGH (ref 1.005–1.030)
Urobilinogen, UA: 0.2 mg/dL (ref 0.0–1.0)
pH: 6 (ref 5.0–8.0)

## 2014-04-11 LAB — I-STAT CHEM 8, ED
BUN: 12 mg/dL (ref 6–23)
Calcium, Ion: 1.13 mmol/L (ref 1.12–1.23)
Chloride: 104 mEq/L (ref 96–112)
Creatinine, Ser: 0.5 mg/dL (ref 0.50–1.10)
Glucose, Bld: 271 mg/dL — ABNORMAL HIGH (ref 70–99)
HCT: 43 % (ref 36.0–46.0)
Hemoglobin: 14.6 g/dL (ref 12.0–15.0)
Potassium: 4 mEq/L (ref 3.7–5.3)
Sodium: 134 mEq/L — ABNORMAL LOW (ref 137–147)
TCO2: 21 mmol/L (ref 0–100)

## 2014-04-11 LAB — CBC
HCT: 35.9 % — ABNORMAL LOW (ref 36.0–46.0)
Hemoglobin: 12.2 g/dL (ref 12.0–15.0)
MCH: 27.5 pg (ref 26.0–34.0)
MCHC: 34 g/dL (ref 30.0–36.0)
MCV: 81 fL (ref 78.0–100.0)
Platelets: 327 10*3/uL (ref 150–400)
RBC: 4.43 MIL/uL (ref 3.87–5.11)
RDW: 13.7 % (ref 11.5–15.5)
WBC: 5.6 10*3/uL (ref 4.0–10.5)

## 2014-04-11 LAB — POC URINE PREG, ED: Preg Test, Ur: NEGATIVE

## 2014-04-11 LAB — URINE MICROSCOPIC-ADD ON: Urine-Other: NONE SEEN

## 2014-04-11 MED ORDER — IOHEXOL 300 MG/ML  SOLN
100.0000 mL | Freq: Once | INTRAMUSCULAR | Status: AC | PRN
  Administered 2014-04-11: 100 mL via INTRAVENOUS

## 2014-04-11 MED ORDER — ACETAMINOPHEN 325 MG PO TABS
650.0000 mg | ORAL_TABLET | Freq: Once | ORAL | Status: AC
  Administered 2014-04-11: 650 mg via ORAL
  Filled 2014-04-11: qty 2

## 2014-04-11 MED ORDER — OXYCODONE-ACETAMINOPHEN 5-325 MG PO TABS: 1.0000 | ORAL_TABLET | Freq: Three times a day (TID) | ORAL | Status: DC | PRN

## 2014-04-11 MED ORDER — ONDANSETRON HCL 4 MG PO TABS: 4.0000 mg | ORAL_TABLET | Freq: Three times a day (TID) | ORAL | Status: DC | PRN

## 2014-04-11 MED ORDER — MORPHINE SULFATE 2 MG/ML IJ SOLN
2.0000 mg | Freq: Once | INTRAMUSCULAR | Status: DC
  Filled 2014-04-11: qty 1

## 2014-04-11 MED ORDER — MORPHINE SULFATE 2 MG/ML IJ SOLN
2.0000 mg | Freq: Once | INTRAMUSCULAR | Status: AC
  Administered 2014-04-11: 2 mg via INTRAVENOUS
  Filled 2014-04-11: qty 1

## 2014-04-11 NOTE — Discharge Instructions (Signed)
Please call your doctor for a followup appointment within 24-48 hours. When you talk to your doctor please let them know that you were seen in the emergency department and have them acquire all of your records so that they can discuss the findings with you and formulate a treatment plan to fully care for your new and ongoing problems. Please call and set-up an appointment with your primary care provider and Orthopedics Please rest and stay hydrated Please take medications as prescribed - while on pain medications there is to be no drinking alcohol, driving, operating any heavy machinery. If extra please dispose in a proper manner. Please do not take any extra Tylenol with this medication for this can lead to Tylenol overdose and liver issues.  Please apply heat and massage Please avoid any physical or strenuous activity Please continue to monitor symptoms closely and if symptoms are to worsen or change (fever greater than 101, chills, sweating, nausea, vomiting, chest pain, shortness of breathe, difficulty breathing, weakness, numbness, tingling, worsening or changes to pain pattern, fall, injury, headache, dizziness, worsening to changes to neck and back pain) please report back to the Emergency Department immediately.    Cervical Strain and Sprain (Whiplash) with Rehab Cervical strain and sprain are injuries that commonly occur with "whiplash" injuries. Whiplash occurs when the neck is forcefully whipped backward or forward, such as during a motor vehicle accident or during contact sports. The muscles, ligaments, tendons, discs, and nerves of the neck are susceptible to injury when this occurs. RISK FACTORS Risk of having a whiplash injury increases if:  Osteoarthritis of the spine.  Situations that make head or neck accidents or trauma more likely.  High-risk sports (football, rugby, wrestling, hockey, auto racing, gymnastics, diving, contact karate, or boxing).  Poor strength and flexibility  of the neck.  Previous neck injury.  Poor tackling technique.  Improperly fitted or padded equipment. SYMPTOMS   Pain or stiffness in the front or back of neck or both.  Symptoms may present immediately or up to 24 hours after injury.  Dizziness, headache, nausea, and vomiting.  Muscle spasm with soreness and stiffness in the neck.  Tenderness and swelling at the injury site. PREVENTION  Learn and use proper technique (avoid tackling with the head, spearing, and head-butting; use proper falling techniques to avoid landing on the head).  Warm up and stretch properly before activity.  Maintain physical fitness:  Strength, flexibility, and endurance.  Cardiovascular fitness.  Wear properly fitted and padded protective equipment, such as padded soft collars, for participation in contact sports. PROGNOSIS  Recovery from cervical strain and sprain injuries is dependent on the extent of the injury. These injuries are usually curable in 1 week to 3 months with appropriate treatment.  RELATED COMPLICATIONS   Temporary numbness and weakness may occur if the nerve roots are damaged, and this may persist until the nerve has completely healed.  Chronic pain due to frequent recurrence of symptoms.  Prolonged healing, especially if activity is resumed too soon (before complete recovery). TREATMENT  Treatment initially involves the use of ice and medication to help reduce pain and inflammation. It is also important to perform strengthening and stretching exercises and modify activities that worsen symptoms so the injury does not get worse. These exercises may be performed at home or with a therapist. For patients who experience severe symptoms, a soft, padded collar may be recommended to be worn around the neck.  Improving your posture may help reduce symptoms. Posture improvement includes  pulling your chin and abdomen in while sitting or standing. If you are sitting, sit in a firm chair  with your buttocks against the back of the chair. While sleeping, try replacing your pillow with a small towel rolled to 2 inches in diameter, or use a cervical pillow or soft cervical collar. Poor sleeping positions delay healing.  For patients with nerve root damage, which causes numbness or weakness, the use of a cervical traction apparatus may be recommended. Surgery is rarely necessary for these injuries. However, cervical strain and sprains that are present at birth (congenital) may require surgery. MEDICATION   If pain medication is necessary, nonsteroidal anti-inflammatory medications, such as aspirin and ibuprofen, or other minor pain relievers, such as acetaminophen, are often recommended.  Do not take pain medication for 7 days before surgery.  Prescription pain relievers may be given if deemed necessary by your caregiver. Use only as directed and only as much as you need. HEAT AND COLD:   Cold treatment (icing) relieves pain and reduces inflammation. Cold treatment should be applied for 10 to 15 minutes every 2 to 3 hours for inflammation and pain and immediately after any activity that aggravates your symptoms. Use ice packs or an ice massage.  Heat treatment may be used prior to performing the stretching and strengthening activities prescribed by your caregiver, physical therapist, or athletic trainer. Use a heat pack or a warm soak. SEEK MEDICAL CARE IF:   Symptoms get worse or do not improve in 2 weeks despite treatment.  New, unexplained symptoms develop (drugs used in treatment may produce side effects). EXERCISES RANGE OF MOTION (ROM) AND STRETCHING EXERCISES - Cervical Strain and Sprain These exercises may help you when beginning to rehabilitate your injury. In order to successfully resolve your symptoms, you must improve your posture. These exercises are designed to help reduce the forward-head and rounded-shoulder posture which contributes to this condition. Your symptoms  may resolve with or without further involvement from your physician, physical therapist or athletic trainer. While completing these exercises, remember:   Restoring tissue flexibility helps normal motion to return to the joints. This allows healthier, less painful movement and activity.  An effective stretch should be held for at least 20 seconds, although you may need to begin with shorter hold times for comfort.  A stretch should never be painful. You should only feel a gentle lengthening or release in the stretched tissue. STRETCH- Axial Extensors  Lie on your back on the floor. You may bend your knees for comfort. Place a rolled-up hand towel or dish towel, about 2 inches in diameter, under the part of your head that makes contact with the floor.  Gently tuck your chin, as if trying to make a "double chin," until you feel a gentle stretch at the base of your head.  Hold __________ seconds. Repeat __________ times. Complete this exercise __________ times per day.  STRETCH - Axial Extension   Stand or sit on a firm surface. Assume a good posture: chest up, shoulders drawn back, abdominal muscles slightly tense, knees unlocked (if standing) and feet hip width apart.  Slowly retract your chin so your head slides back and your chin slightly lowers. Continue to look straight ahead.  You should feel a gentle stretch in the back of your head. Be certain not to feel an aggressive stretch since this can cause headaches later.  Hold for __________ seconds. Repeat __________ times. Complete this exercise __________ times per day. STRETCH - Cervical Side Bend  Stand or sit on a firm surface. Assume a good posture: chest up, shoulders drawn back, abdominal muscles slightly tense, knees unlocked (if standing) and feet hip width apart.  Without letting your nose or shoulders move, slowly tip your right / left ear to your shoulder until your feel a gentle stretch in the muscles on the opposite side  of your neck.  Hold __________ seconds. Repeat __________ times. Complete this exercise __________ times per day. STRETCH - Cervical Rotators   Stand or sit on a firm surface. Assume a good posture: chest up, shoulders drawn back, abdominal muscles slightly tense, knees unlocked (if standing) and feet hip width apart.  Keeping your eyes level with the ground, slowly turn your head until you feel a gentle stretch along the back and opposite side of your neck.  Hold __________ seconds. Repeat __________ times. Complete this exercise __________ times per day. RANGE OF MOTION - Neck Circles   Stand or sit on a firm surface. Assume a good posture: chest up, shoulders drawn back, abdominal muscles slightly tense, knees unlocked (if standing) and feet hip width apart.  Gently roll your head down and around from the back of one shoulder to the back of the other. The motion should never be forced or painful.  Repeat the motion 10-20 times, or until you feel the neck muscles relax and loosen. Repeat __________ times. Complete the exercise __________ times per day. STRENGTHENING EXERCISES - Cervical Strain and Sprain These exercises may help you when beginning to rehabilitate your injury. They may resolve your symptoms with or without further involvement from your physician, physical therapist, or athletic trainer. While completing these exercises, remember:   Muscles can gain both the endurance and the strength needed for everyday activities through controlled exercises.  Complete these exercises as instructed by your physician, physical therapist, or athletic trainer. Progress the resistance and repetitions only as guided.  You may experience muscle soreness or fatigue, but the pain or discomfort you are trying to eliminate should never worsen during these exercises. If this pain does worsen, stop and make certain you are following the directions exactly. If the pain is still present after  adjustments, discontinue the exercise until you can discuss the trouble with your clinician. STRENGTH - Cervical Flexors, Isometric  Face a wall, standing about 6 inches away. Place a small pillow, a ball about 6-8 inches in diameter, or a folded towel between your forehead and the wall.  Slightly tuck your chin and gently push your forehead into the soft object. Push only with mild to moderate intensity, building up tension gradually. Keep your jaw and forehead relaxed.  Hold 10 to 20 seconds. Keep your breathing relaxed.  Release the tension slowly. Relax your neck muscles completely before you start the next repetition. Repeat __________ times. Complete this exercise __________ times per day. STRENGTH- Cervical Lateral Flexors, Isometric   Stand about 6 inches away from a wall. Place a small pillow, a ball about 6-8 inches in diameter, or a folded towel between the side of your head and the wall.  Slightly tuck your chin and gently tilt your head into the soft object. Push only with mild to moderate intensity, building up tension gradually. Keep your jaw and forehead relaxed.  Hold 10 to 20 seconds. Keep your breathing relaxed.  Release the tension slowly. Relax your neck muscles completely before you start the next repetition. Repeat __________ times. Complete this exercise __________ times per day. STRENGTH - Cervical Extensors, Isometric  Stand about 6 inches away from a wall. Place a small pillow, a ball about 6-8 inches in diameter, or a folded towel between the back of your head and the wall.  Slightly tuck your chin and gently tilt your head back into the soft object. Push only with mild to moderate intensity, building up tension gradually. Keep your jaw and forehead relaxed.  Hold 10 to 20 seconds. Keep your breathing relaxed.  Release the tension slowly. Relax your neck muscles completely before you start the next repetition. Repeat __________ times. Complete this exercise  __________ times per day. POSTURE AND BODY MECHANICS CONSIDERATIONS - Cervical Strain and Sprain Keeping correct posture when sitting, standing or completing your activities will reduce the stress put on different body tissues, allowing injured tissues a chance to heal and limiting painful experiences. The following are general guidelines for improved posture. Your physician or physical therapist will provide you with any instructions specific to your needs. While reading these guidelines, remember:  The exercises prescribed by your provider will help you have the flexibility and strength to maintain correct postures.  The correct posture provides the optimal environment for your joints to work. All of your joints have less wear and tear when properly supported by a spine with good posture. This means you will experience a healthier, less painful body.  Correct posture must be practiced with all of your activities, especially prolonged sitting and standing. Correct posture is as important when doing repetitive low-stress activities (typing) as it is when doing a single heavy-load activity (lifting). PROLONGED STANDING WHILE SLIGHTLY LEANING FORWARD When completing a task that requires you to lean forward while standing in one place for a long time, place either foot up on a stationary 2- to 4-inch high object to help maintain the best posture. When both feet are on the ground, the low back tends to lose its slight inward curve. If this curve flattens (or becomes too large), then the back and your other joints will experience too much stress, fatigue more quickly, and can cause pain.  RESTING POSITIONS Consider which positions are most painful for you when choosing a resting position. If you have pain with flexion-based activities (sitting, bending, stooping, squatting), choose a position that allows you to rest in a less flexed posture. You would want to avoid curling into a fetal position on your side.  If your pain worsens with extension-based activities (prolonged standing, working overhead), avoid resting in an extended position such as sleeping on your stomach. Most people will find more comfort when they rest with their spine in a more neutral position, neither too rounded nor too arched. Lying on a non-sagging bed on your side with a pillow between your knees, or on your back with a pillow under your knees will often provide some relief. Keep in mind, being in any one position for a prolonged period of time, no matter how correct your posture, can still lead to stiffness. WALKING Walk with an upright posture. Your ears, shoulders, and hips should all line up. OFFICE WORK When working at a desk, create an environment that supports good, upright posture. Without extra support, muscles fatigue and lead to excessive strain on joints and other tissues. CHAIR:  A chair should be able to slide under your desk when your back makes contact with the back of the chair. This allows you to work closely.  The chair's height should allow your eyes to be level with the upper part of your monitor and  your hands to be slightly lower than your elbows.  Body position:  Your feet should make contact with the floor. If this is not possible, use a foot rest.  Keep your ears over your shoulders. This will reduce stress on your neck and low back. Document Released: 07/14/2005 Document Revised: 11/28/2013 Document Reviewed: 10/26/2008 [facility] Patient Information 2015 [place], [place]. This information is not intended to replace advice given to you by your health care provider. Make sure you discuss any questions you have with your health care provider. Lumbosacral Strain Lumbosacral strain is a strain of any of the parts that make up your lumbosacral vertebrae. Your lumbosacral vertebrae are the bones that make up the lower third of your backbone. Your lumbosacral vertebrae are held together by muscles and tough,  fibrous tissue (ligaments).  CAUSES  A sudden blow to your back can cause lumbosacral strain. Also, anything that causes an excessive stretch of the muscles in the low back can cause this strain. This is typically seen when people exert themselves strenuously, fall, lift heavy objects, bend, or crouch repeatedly. RISK FACTORS  Physically demanding work.  Participation in pushing or pulling sports or sports that require a sudden twist of the back (tennis, golf, baseball).  Weight lifting.  Excessive lower back curvature.  Forward-tilted pelvis.  Weak back or abdominal muscles or both.  Tight hamstrings. SIGNS AND SYMPTOMS  Lumbosacral strain may cause pain in the area of your injury or pain that moves (radiates) down your leg.  DIAGNOSIS Your health care provider can often diagnose lumbosacral strain through a physical exam. In some cases, you may need tests such as X-ray exams.  TREATMENT  Treatment for your lower back injury depends on many factors that your clinician will have to evaluate. However, most treatment will include the use of anti-inflammatory medicines. HOME CARE INSTRUCTIONS   Avoid hard physical activities (tennis, racquetball, waterskiing) if you are not in proper physical condition for it. This may aggravate or create problems.  If you have a back problem, avoid sports requiring sudden body movements. Swimming and walking are generally safer activities.  Maintain good posture.  Maintain a healthy weight.  For acute conditions, you may put ice on the injured area.  Put ice in a plastic bag.  Place a towel between your skin and the bag.  Leave the ice on for 20 minutes, 2-3 times a day.  When the low back starts healing, stretching and strengthening exercises may be recommended. SEEK MEDICAL CARE IF:  Your back pain is getting worse.  You experience severe back pain not relieved with medicines. SEEK IMMEDIATE MEDICAL CARE IF:   You have numbness,  tingling, weakness, or problems with the use of your arms or legs.  There is a change in bowel or bladder control.  You have increasing pain in any area of the body, including your belly (abdomen).  You notice shortness of breath, dizziness, or feel faint.  You feel sick to your stomach (nauseous), are throwing up (vomiting), or become sweaty.  You notice discoloration of your toes or legs, or your feet get very cold. MAKE SURE YOU:   Understand these instructions.  Will watch your condition.  Will get help right away if you are not doing well or get worse. Document Released: 04/23/2005 Document Revised: 07/19/2013 Document Reviewed: 03/02/2013 [facility] Patient Information 2015 [place], [place]. This information is not intended to replace advice given to you by your health care provider. Make sure you discuss any questions you  have with your health care provider.
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
## 2014-04-17 ENCOUNTER — Telehealth (HOSPITAL_BASED_OUTPATIENT_CLINIC_OR_DEPARTMENT_OTHER): Payer: Self-pay | Admitting: Emergency Medicine

## 2014-11-27 ENCOUNTER — Encounter (HOSPITAL_COMMUNITY): Payer: Self-pay

## 2014-11-27 ENCOUNTER — Emergency Department (INDEPENDENT_AMBULATORY_CARE_PROVIDER_SITE_OTHER)
Admission: EM | Admit: 2014-11-27 | Discharge: 2014-11-27 | Disposition: A | Discharge status: Home / Self Care | Payer: Self-pay | Source: Home / Self Care | Attending: Family Medicine | Admitting: Family Medicine

## 2014-11-27 DIAGNOSIS — J029 Acute pharyngitis, unspecified: Secondary | ICD-10-CM

## 2014-11-27 LAB — POCT RAPID STREP A: Streptococcus, Group A Screen (Direct): NEGATIVE

## 2014-11-27 LAB — POCT INFECTIOUS MONO SCREEN: Mono Screen: NEGATIVE

## 2014-11-27 NOTE — ED Provider Notes (Signed)
CSN: [id number]     Arrival date & time 11/27/14  0802 History   First MD Initiated Contact with Patient 11/27/14 [phone]     Chief Complaint  Patient presents with  . Sore Throat   (Consider location/radiation/quality/duration/timing/severity/associated sxs/prior Treatment) HPI Comments: Patient presents with 2 week history of sore throat. State her mother was recently diagnosed "with something wrong with her thyroid gland and it is bad." Her mother's recent diagnosis has caused her significant anxiety and she externally examines her own neck and throat many times a day. She states she thinks she has discovered a small lump at her right anterolateral neck that feels sore and this is of concern to her. No difficulty breathing, speaking or swallowing. No weight loss. Nonsmoker. No fever/chills or URI sx. Has not discussed her concerns with her PCP.   Patient is a 29 y.o. female presenting with pharyngitis. The history is provided by the patient.  Sore Throat    Past Medical History  Diagnosis Date  . Diabetes mellitus without complication    History reviewed. No pertinent past surgical history. History reviewed. No pertinent family history. History  Substance Use Topics  . Smoking status: Never Smoker   . Smokeless tobacco: Never Used  . Alcohol Use: Yes   OB History    No data available     Review of Systems  All other systems reviewed and are negative.   Allergies  Review of patient's allergies indicates no known allergies.  Home Medications   Prior to Admission medications   Medication Sig Start Date End Date Taking? Authorizing Provider  ALPRAZolam (XANAX) 0.25 MG tablet Take 1 tablet (0.25 mg total) by mouth every 8 (eight) hours as needed for anxiety. 02/12/13   Oswald Hillock, MD  insulin lispro (HUMALOG KWIKPEN) 100 UNIT/ML injection Inject 0-4 Units into the skin 3 (three) times daily before meals. Pt uses sliding scale before meals    Historical Provider, MD  insulin NPH  (HUMULIN N,NOVOLIN N) 100 UNIT/ML injection Inject 21 Units into the skin 2 (two) times daily before a meal.     Historical Provider, MD  ondansetron (ZOFRAN) 4 MG tablet Take 1 tablet (4 mg total) by mouth every 8 (eight) hours as needed for nausea or vomiting. 04/11/14   Marissa Sciacca, PA-C  oxyCODONE-acetaminophen (PERCOCET/ROXICET) 5-325 MG per tablet Take 1 tablet by mouth every 8 (eight) hours as needed for moderate pain or severe pain. 04/11/14   Marissa Sciacca, PA-C   BP 143/84 mmHg  Pulse 109  Temp(Src) 98.1 F (36.7 C) (Oral)  Resp 14  SpO2 99%  LMP 10/31/2014 (Exact Date) Physical Exam  Constitutional: She is oriented to person, place, and time. She appears well-developed and well-nourished.  HENT:  Head: Normocephalic and atraumatic.  Right Ear: Hearing, tympanic membrane, external ear and ear canal normal.  Left Ear: Hearing, tympanic membrane, external ear and ear canal normal.  Nose: Nose normal.  Mouth/Throat: Uvula is midline, oropharynx is clear and moist and mucous membranes are normal. No oral lesions. No trismus in the jaw. Normal dentition. No uvula swelling.  Eyes: Conjunctivae are normal.  Neck: Trachea normal, normal range of motion, full passive range of motion without pain and phonation normal. Neck supple. Normal range of motion present. No thyroid mass and no thyromegaly present.    Cardiovascular: Normal rate, regular rhythm and normal heart sounds.   Pulmonary/Chest: Effort normal and breath sounds normal. No stridor.  Musculoskeletal: Normal range of motion.  Neurological: She  is alert and oriented to person, place, and time.  Skin: Skin is warm and dry.  Psychiatric: She has a normal mood and affect. Her behavior is normal.  Nursing note and vitals reviewed.   ED Course  Procedures (including critical care time) Labs Review Labs Reviewed  POCT RAPID STREP A ([facility] ONLY)  POCT INFECTIOUS MONO SCREEN    Imaging Review No results  found.   MDM   1. Sore throat    Rapid strep negative Mono spot negative  throat swab will be sent for a three day culture and if results indicate the need for additional treatment, patient will be notified by phone. If symptoms persist or your have additional concerns, please follow up with Dr. Ernie Hew for further evaluation. Exam and vital signs are very reassuring. I suspect much of her symptoms are rooted in anxiety regarding her mother's recent diagnosis and countless self exams causing localized irritation.     Lutricia Feil, PA 11/27/14 [place], [place] 11/27/14 1003

## 2014-11-27 NOTE — ED Notes (Signed)
Please call pt on cell for lab issues

## 2014-11-27 NOTE — Discharge Instructions (Signed)
Your tests for both mononucleosis and strep throat were both negative. Your throat swab will be sent for a three day culture and if results indicate the need for additional treatment, you will be notified by phone. If symptoms persist or your have additional concerns, please follow up with Dr. Ernie Hew for further evaluation.   Strep Throat Tests While most sore throats are caused by viruses, at times they are caused by a bacteria called group A Streptococci (strep throat). It is important to determine the cause because the strep bacteria is treated with antibiotic medication. There are 2 types of tests for strep throat: a rapid strep test and a throat culture. Both tests are done by wiping a swab over the back of the throat and then using chemicals to identify the type of bacteria present. The rapid strep test takes 10 to 20 minutes. If the rapid strep test is negative, a throat culture may be performed to confirm the results. With a throat culture, the swab is used to spread the bacteria on a gel plate and grow it in a lab, which may take 1 to 2 days. In some cases, the culture will detect strep bacteria not found with the rapid strep test. If the result of the rapid strep test is positive, no further testing is needed, and your caregiver will prescribe antibiotics. Not all test results are available during your visit. If your test results are not back during the visit, make an appointment with your caregiver to find out the results. Do not assume everything is normal if you have not heard from your caregiver or the medical facility. It is important for you to follow up on all of your test results. SEEK MEDICAL CARE IF:   Your symptoms are not improving within 1 to 2 days, or you are getting worse.  You have any other questions or concerns. SEEK IMMEDIATE MEDICAL CARE IF:   You have increased difficulty with swallowing.  You develop trouble breathing.  You have a fever. Document Released: 08/21/2004  Document Revised: 10/06/2011 Document Reviewed: 10/19/2013 [facility] Patient Information 2015 [place], [place]. This information is not intended to replace advice given to you by your health care provider. Make sure you discuss any questions you have with your health care provider.  Salt Water Gargle This solution will help make your mouth and throat feel better. HOME CARE INSTRUCTIONS   Mix 1 teaspoon of salt in 8 ounces of warm water.  Gargle with this solution as much or often as you need or as directed. Swish and gargle gently if you have any sores or wounds in your mouth.  Do not swallow this mixture. Document Released: 04/17/2004 Document Revised: 10/06/2011 Document Reviewed: 09/08/2008 [facility] Patient Information 2015 [place], [place]. This information is not intended to replace advice given to you by your health care provider. Make sure you discuss any questions you have with your health care provider.  Sore Throat A sore throat is pain, burning, irritation, or scratchiness of the throat. There is often pain or tenderness when swallowing or talking. A sore throat may be accompanied by other symptoms, such as coughing, sneezing, fever, and swollen neck glands. A sore throat is often the first sign of another sickness, such as a cold, flu, strep throat, or mononucleosis (commonly known as mono). Most sore throats go away without medical treatment. CAUSES  The most common causes of a sore throat include:  A viral infection, such as a cold, flu, or mono.  A bacterial  infection, such as strep throat, tonsillitis, or whooping cough.  Seasonal allergies.  Dryness in the air.  Irritants, such as smoke or pollution.  Gastroesophageal reflux disease (GERD). HOME CARE INSTRUCTIONS   Only take over-the-counter medicines as directed by your caregiver.  Drink enough fluids to keep your urine clear or pale yellow.  Rest as needed.  Try using throat sprays, lozenges, or sucking on hard  candy to ease any pain (if older than 4 years or as directed).  Sip warm liquids, such as broth, herbal tea, or warm water with honey to relieve pain temporarily. You may also eat or drink cold or frozen liquids such as frozen ice pops.  Gargle with salt water (mix 1 tsp salt with 8 oz of water).  Do not smoke and avoid secondhand smoke.  Put a cool-mist humidifier in your bedroom at night to moisten the air. You can also turn on a hot shower and sit in the bathroom with the door closed for 5-10 minutes. SEEK IMMEDIATE MEDICAL CARE IF:  You have difficulty breathing.  You are unable to swallow fluids, soft foods, or your saliva.  You have increased swelling in the throat.  Your sore throat does not get better in 7 days.  You have nausea and vomiting.  You have a fever or persistent symptoms for more than 2-3 days.  You have a fever and your symptoms suddenly get worse. MAKE SURE YOU:   Understand these instructions.  Will watch your condition.  Will get help right away if you are not doing well or get worse. Document Released: 08/21/2004 Document Revised: 06/30/2012 Document Reviewed: 03/21/2012 [facility] Patient Information 2015 [place], [place]. This information is not intended to replace advice given to you by your health care provider. Make sure you discuss any questions you have with your health care provider.

## 2014-11-27 NOTE — ED Notes (Signed)
C/o 2 week duration of ST, knot in her throat. Admits to using left over PCN from a dental procedure to see if this will help

## 2014-11-29 LAB — CULTURE, GROUP A STREP: Strep A Culture: NEGATIVE

## 2015-07-09 ENCOUNTER — Emergency Department (HOSPITAL_COMMUNITY)
Admission: EM | Admit: 2015-07-09 | Discharge: 2015-07-09 | Disposition: A | Discharge status: Home / Self Care | Payer: 59 | Attending: Emergency Medicine | Admitting: Emergency Medicine

## 2015-07-09 ENCOUNTER — Emergency Department (HOSPITAL_COMMUNITY): Payer: 59

## 2015-07-09 ENCOUNTER — Encounter (HOSPITAL_COMMUNITY): Payer: Self-pay

## 2015-07-09 DIAGNOSIS — E119 Type 2 diabetes mellitus without complications: Secondary | ICD-10-CM

## 2015-07-09 DIAGNOSIS — Z794 Long term (current) use of insulin: Secondary | ICD-10-CM | POA: Insufficient documentation

## 2015-07-09 DIAGNOSIS — R519 Headache, unspecified: Secondary | ICD-10-CM

## 2015-07-09 DIAGNOSIS — C719 Malignant neoplasm of brain, unspecified: Secondary | ICD-10-CM | POA: Insufficient documentation

## 2015-07-09 DIAGNOSIS — IMO0001 Reserved for inherently not codable concepts without codable children: Secondary | ICD-10-CM

## 2015-07-09 DIAGNOSIS — E109 Type 1 diabetes mellitus without complications: Secondary | ICD-10-CM | POA: Insufficient documentation

## 2015-07-09 DIAGNOSIS — Z3202 Encounter for pregnancy test, result negative: Secondary | ICD-10-CM | POA: Insufficient documentation

## 2015-07-09 DIAGNOSIS — J029 Acute pharyngitis, unspecified: Secondary | ICD-10-CM | POA: Insufficient documentation

## 2015-07-09 DIAGNOSIS — R51 Headache: Secondary | ICD-10-CM

## 2015-07-09 DIAGNOSIS — F419 Anxiety disorder, unspecified: Secondary | ICD-10-CM | POA: Insufficient documentation

## 2015-07-09 LAB — BASIC METABOLIC PANEL
Anion gap: 8 (ref 5–15)
BUN: 12 mg/dL (ref 6–20)
CO2: 25 mmol/L (ref 22–32)
Calcium: 9.6 mg/dL (ref 8.9–10.3)
Chloride: 105 mmol/L (ref 101–111)
Creatinine, Ser: 0.56 mg/dL (ref 0.44–1.00)
GFR calc Af Amer: >60 mL/min (ref >60)
GFR calc non Af Amer: >60 mL/min (ref >60)
Glucose, Bld: 37 mg/dL — CL (ref 65–99)
Potassium: 3.6 mmol/L (ref 3.5–5.1)
Sodium: 138 mmol/L (ref 135–145)

## 2015-07-09 LAB — RAPID STREP SCREEN (MED CTR MEBANE ONLY): Streptococcus, Group A Screen (Direct): NEGATIVE

## 2015-07-09 LAB — CBC WITH DIFFERENTIAL/PLATELET
Basophils Absolute: 0.1 10*3/uL (ref 0.0–0.1)
Basophils Relative: 1 %
Eosinophils Absolute: 0.1 10*3/uL (ref 0.0–0.7)
Eosinophils Relative: 2 %
HCT: 39.8 % (ref 36.0–46.0)
Hemoglobin: 13.1 g/dL (ref 12.0–15.0)
Lymphocytes Relative: 33 %
Lymphs Abs: 2.2 10*3/uL (ref 0.7–4.0)
MCH: 28.5 pg (ref 26.0–34.0)
MCHC: 32.9 g/dL (ref 30.0–36.0)
MCV: 86.7 fL (ref 78.0–100.0)
Monocytes Absolute: 0.6 10*3/uL (ref 0.1–1.0)
Monocytes Relative: 8 %
Neutro Abs: 3.8 10*3/uL (ref 1.7–7.7)
Neutrophils Relative %: 56 %
Platelets: 384 10*3/uL (ref 150–400)
RBC: 4.59 MIL/uL (ref 3.87–5.11)
RDW: 14 % (ref 11.5–15.5)
WBC: 6.8 10*3/uL (ref 4.0–10.5)

## 2015-07-09 LAB — I-STAT BETA HCG BLOOD, ED (MC, WL, AP ONLY): I-stat hCG, quantitative: <5 m[IU]/mL (ref <5)

## 2015-07-09 LAB — CBG MONITORING, ED
Glucose-Capillary: 34 mg/dL — CL (ref 65–99)
Glucose-Capillary: 98 mg/dL (ref 65–99)

## 2015-07-09 MED ORDER — BUTALBITAL-APAP-CAFFEINE 50-325-40 MG PO TABS: 1.0000 | ORAL_TABLET | Freq: Four times a day (QID) | ORAL | Status: DC | PRN

## 2015-07-09 MED ORDER — HYDROXYZINE HCL 25 MG PO TABS: 25.0000 mg | ORAL_TABLET | Freq: Four times a day (QID) | ORAL | Status: DC

## 2015-07-09 MED ORDER — SODIUM CHLORIDE 0.9 % IV BOLUS (SEPSIS)
1000.0000 mL | Freq: Once | INTRAVENOUS | Status: AC
  Administered 2015-07-09: 1000 mL via INTRAVENOUS

## 2015-07-09 MED ORDER — PROCHLORPERAZINE EDISYLATE 5 MG/ML IJ SOLN
10.0000 mg | Freq: Once | INTRAMUSCULAR | Status: AC
  Administered 2015-07-09: 10 mg via INTRAVENOUS
  Filled 2015-07-09: qty 2

## 2015-07-09 NOTE — ED Notes (Signed)
Pt given turkey sandwich and OJ 

## 2015-07-09 NOTE — ED Notes (Signed)
Pt reported having headache and sore throat with dental problems. Pt Glucose 37. Pt denies s/s of hypoglycemia.
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
## 2015-07-09 NOTE — ED Notes (Signed)
Pt ambulating independently w/ steady gait on d/c in no acute distress, A&Ox4. D/c instructions reviewed w/ pt and family - pt and family deny any further questions or concerns at present. Rx given x1  
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
## 2015-07-09 NOTE — Progress Notes (Signed)
EDCM consulted to assist with pcp assistance.  EDCM spoke to patient at bedside.  Patient reports her endocrinologist is Dr. Elyse Hsu, but she has been unable to see him because she has lost her insurance.  She also reports she sees Dr. Rachell Cipro for pcp but  has not had the money to see her either.  Patient reports she has a Pharmacist, hospital friend who has been giving her insulin when he has extra.  Patient reports she is taking, novalog with meals and NPH twice a day.  [facility] provided patient with Lilly patient assistance application from needymeds.org, and also novo nordisc application.  [facility] provided patient with Lilly coupon for free vial of insulin.  Patient is agreeable to have [facility] attempt to arrange appointment at [facility].  Patient is also agreeable to have referral  placed to [facility] for orange card. Patient reports she has insulin at home.  Patient thankful for services.  No further EDCM needs at this time.

## 2015-07-09 NOTE — Discharge Instructions (Signed)
Do not hesitate to return to the Emergency Department for any new, worsening or concerning symptoms.   If you do not have a primary care doctor you can establish one at the   [facility] [place] [address] [phone]  After you establish care. Let them know you were seen in the emergency room. They must obtain records for further management.    Please follow with your primary care doctor in the next 2 days for a check-up. They must obtain records for further management.   Do not hesitate to return to the Emergency Department for any new, worsening or concerning symptoms.    Emergency Department Resource Guide 1) Find a Doctor and Pay Out of Pocket Although you won't have to find out who is covered by your insurance plan, it is a good idea to ask around and get recommendations. You will then need to call the office and see if the doctor you have chosen will accept you as a new patient and what types of options they offer for patients who are self-pay. Some doctors offer discounts or will set up payment plans for their patients who do not have insurance, but you will need to ask so you aren't surprised when you get to your appointment.  2) Contact Your Local Health Department Not all health departments have doctors that can see patients for sick visits, but many do, so it is worth a call to see if yours does. If you don't know where your local health department is, you can check in your phone book. The CDC also has a tool to help you locate your state's health department, and many state websites also have listings of all of their local health departments.  3) Find a [facility] If your illness is not likely to be very severe or complicated, you may want to try a walk in clinic. These are popping up all over the country in pharmacies, drugstores, and shopping centers. They're usually staffed by nurse practitioners or physician assistants that have been  trained to treat common illnesses and complaints. They're usually fairly quick and inexpensive. However, if you have serious medical issues or chronic medical problems, these are probably not your best option.  No Primary Care Doctor: - Call Health Connect at  [phone] - they can help you locate a primary care doctor that  accepts your insurance, provides certain services, etc. - Physician Referral Service- [phone]  Chronic Pain Problems: Organization         Address  Phone   Notes  [facility]  [phone] Patients need to be referred by their primary care doctor.   Medication Assistance: Organization         Address  Phone   Notes  [facility] Medication [facility] [address] [place], [place] [address] [phone] --Must be a resident of [facility] -- Must have NO insurance coverage whatsoever (no Medicaid/ Medicare, etc.) -- The pt. MUST have a primary care doctor that directs their care regularly and follows them in the community   MedAssist  [phone]   [employer]  [phone]    Agencies that provide inexpensive medical care: Organization         Address  Phone   Notes  [place]  [phone]   [facility]    [phone]   [facility] [place],  [place] [address] [phone]   [place] [address] [place] [phone]   Planned Parenthood    2013185306   [facility]    [phone]   [place] and [place] [address], [place] Phone:  [phone], Fax:  [phone] Hours of Operation:  9 am - 6 pm, M-F.  Also accepts Medicaid/Medicare and self-pay.  [facility] for [place] [address], [address], [place] Phone: [phone], Fax: [phone]. Hours of Operation:  8:30 am - 5:30 pm, M-F.  Also accepts Medicaid and self-pay.   [facility] [place] [address] [place] Phone: [phone]   [place], [place], [place] [phone], Ext. [phone] Mondays & Thursdays: 7-9 AM.  First 15 patients are seen on a first come, first serve basis.    [place] Providers:  Organization         Address  Phone   Notes  [facility] [address], [place] [phone] Also accepts self-pay patients.  [facility] [id number] [place], [place]  [phone]   [place], [address], [place] [phone]   [facility] [address], [place] [phone]   Lucianne Lei [address], [place]   [phone] Only accepts [place] Access [place] patients after they have their name applied to their card.   Self-Pay (no insurance) in [facility]:  Organization         Address  Phone   Notes  Sickle Cell Patients, [facility] [place] [phone]   [facility] [place] [phone]   [facility] [place]  [place], [place], [place] [phone]   [facility]/Dr. Osei-Bonsu  [address], [place] or [address], [place] [phone] Phone number for both [place] and [place] locations is the same.  [facility] [address] [place] [phone]   [facility] [address], [place] or [address] [phone]   [facility] [address] [place] [phone], phone; [phone], fax Sees patients 1st and 3rd Saturday of every month.  Must not qualify for public or private insurance (i.e. Medicaid, Medicare, [place] Health Choice, Veterans' Benefits)  Household income should be no  more than 200% of the poverty level The clinic cannot treat you if you are pregnant or think you are pregnant  Sexually transmitted diseases are not treated at the clinic.    Dental Care: Organization         Address  Phone  Notes  [facility] Department of [facility] [phone] Accepts children up to age 53 who are enrolled in [place] or [place]; pregnant women with a Medicaid card; and children who have applied for Medicaid or Center Health Choice, but were declined, whose parents can pay a reduced fee at time of service.  [facility] Department of [facility]  [address], [place] [phone] Accepts children up to age 27 who are enrolled in [place] or [place]; pregnant  women with a Medicaid card; and children who have applied for Medicaid or [place] Health Choice, but were declined, whose parents can pay a reduced fee at time of service.  [place] Adult Dental Access PROGRAM  [place] [phone] Patients are seen by appointment only. Walk-ins are not accepted. [place] will see patients 39 years of age and older. Monday - Tuesday (8am-5pm) Most Wednesdays (8:30-5pm) $30 per visit, cash only  [facility] Adult Dental Access PROGRAM  [address], [facility] [phone] Patients are seen by appointment only. Walk-ins are not accepted. [place] will see patients 51 years of age and older. One Wednesday Evening (Monthly: Volunteer Based).  $30 per visit, cash only  [place]  [phone] for adults; Children under age 32, call Graduate Pediatric Dentistry at [phone]. Children aged 58-14, please call [phone] to request a pediatric application.  Dental services are provided in all areas of dental care including fillings, crowns and bridges, complete and partial dentures, implants, gum treatment, root canals,  and extractions. Preventive care is also provided. Treatment is provided to both adults and children. Patients are selected via a lottery and there is often a waiting list.   [facility] [address], [place]  [phone] [email or web address]   [facility] [address] [place], [place] [phone], Ext. [phone] Second and Fourth Thursday of each month, opens at 6:30 AM; Clinic ends at 9 AM.  Patients are seen on a first-come first-served basis, and a limited number are seen during each clinic.   [facility]  [address] Hillard Danker [place], [place] [phone]   Eligibility Requirements You must have lived in [place], [place], or [place] counties for at least the last three months.   You cannot be eligible for state or federal sponsored [employer], including [employer], [place], or [employer].   You generally cannot be eligible for healthcare insurance through your employer.    How to apply: Eligibility screenings are held every Tuesday and Wednesday afternoon from 1:00 pm until 4:00 pm. You do not need an appointment for the interview!  [facility] [address] [place], [place]   [place]  [place] Department  [place]  [phone]    Behavioral Health Resources in the Community: Intensive Outpatient Programs Organization         Address  Phone  Notes  [place]. [address] [place], [place] [phone]   [facility]patient [address] [place], [place]   [facility]: [facility] [address], [place], [place]   [place] [address],  [place], [place] or [phone]   Substance Abuse Resources Organization         Address  Phone  Notes  Alcohol and Drug Services  [phone]    [place]  [phone]   [facility] [place]   Chinita Pester  [phone]   Residential & Outpatient Substance Abuse Program  6467600809   Psychological Services Organization         Address  Phone  Notes  [facility]  [place]  [phone]   [place] [address], [place] or [phone]    Mobile Crisis  Teams Organization         Address  Phone  Notes  [facility], Mobile Crisis Care Unit  [phone]   [facility]ive Psychotherapeutic Services  [address] [place], [place]   [facility] [address], [place] [phone]    Self-Help/Support Groups Organization         Address  Phone             Notes  [place]. of [place] - variety of support groups  Scraper Call for more information  Narcotics Anonymous (NA), [facility] [address], [employer] [place]  2 meetings at this location   Special educational needs teacher         Address  Phone  Notes  [facility] [place],    [place]  [phone]   [facility]  [address] [place] [id number], [place], [place]   [place], [place] [phone] Admissions: 8am-3pm M-F  Incentives Substance [place] [address]    [place], [place] [phone]   [facility] [address] [place], [place], [place]   [facility] [address],  [place], [place]   [facility] - Intensive Outpatient [address] Kristeen [address], [place], [place]   [facility] ([place] [place].,  [place], [place] [phone] or [phone]   [facility] (RTS) [address], [place], [place] Accepts Medicaid  [facility] [place] [address],    [place] [phone] Substance Abuse/Addiction Treatment   [facility] Organization         Address  Phone  Notes  [facility]  [phone]   Domenic Schwab, PhD [address] Arlis Porta [place], [place]   [phone] or [phone]   [place], [place] [phone]   [facility] [address] [place], [place] [phone] Insurance/Medicaid/sponsorship through [facility] and Families [address] [place]                                    [place], [place] [phone] [place] [address], [place] [phone]    Dr. Adele Schilder  [phone]   [facility] Dept. 1) [address], [place] 2) [place] 3)  [place] [address], [place] [phone]  [phone]   [place] [phone] or [phone] (After Hours)

## 2015-07-09 NOTE — ED Notes (Signed)
MD at bedside. 

## 2015-07-10 ENCOUNTER — Telehealth: Payer: Self-pay

## 2015-07-10 NOTE — Telephone Encounter (Signed)
Request received from Livia Snellen, RN CM for a follow up appointment for the [facility] at [facility] as well as assistance with the orange card application. Call placed to the patient at # [phone] (M) and a HIPAA compliant voice mail message was left requesting a call back to # [phone] or [phone].   Update provided to A. Christen Bame, RN CM.

## 2015-07-11 ENCOUNTER — Telehealth: Payer: Self-pay

## 2015-07-11 LAB — CULTURE, GROUP A STREP: Strep A Culture: POSITIVE — AB

## 2015-07-11 NOTE — Telephone Encounter (Signed)
Message received from the patient requesting a call back.   This CM returned the call and the patient stated that she would like to schedule an appointment for financial counseling as she is interested in obtaining an orange card.  Scheduled a financial counseling appointment for 07/18/15 @ 1400 at [facility].  The patient stated that she knows where the clinic is located. She was also interested in scheduling a medical follow up appointment and an appointment was scheduled for 07/25/15 @ 0900 at the sickle cell clinic. Provided the patient with the address for the sickle cell clinic.  The patient stated that she has difficulty obtaining her medications and diabetic supplies. She said that a friend has been helping her obtain her insulin and diabetic supplies. Explained the assistance that can be  provided by the [facility] pharmacy.   The patient was very appreciative of the call.    Update provided to Livia Snellen, RN CM.

## 2015-07-11 NOTE — Telephone Encounter (Signed)
Attempted to contact the patient to discuss scheduling a follow up appointment and the services provided at [facility].  Call placed to # [phone] (M) and a HIPAA compliant voice mail message was left requesting a call back to # [phone] or [phone].

## 2015-07-12 ENCOUNTER — Telehealth (HOSPITAL_BASED_OUTPATIENT_CLINIC_OR_DEPARTMENT_OTHER): Payer: Self-pay | Admitting: Emergency Medicine

## 2015-07-12 NOTE — Telephone Encounter (Incomplete)
Post ED Visit - Positive Culture Follow-up: Successful Patient Follow-Up  Culture assessed and recommendations reviewed by: [x]  Elenor Quinones, Pharm.D. []  Heide Guile, Pharm.D., BCPS []  Parks Neptune, Pharm.D. []  Alycia Rossetti, Pharm.D., BCPS []  [place], Pharm.D., BCPS, AAHIVP []  Legrand Como, Pharm.D., BCPS, AAHIVP []  Milus Glazier, Pharm.D. []  Stephens November, Pharm.D.  Positive strep culture   [x]  Patient discharged without antimicrobial prescription and treatment is now indicated []  Organism is resistant to prescribed ED discharge antimicrobial []  Patient with positive blood cultures  Changes discussed with ED provider: Margarita Mail PA New antibiotic prescription:  Amoxicillin 500mg  po bid x 10 days, Diflucan 150mg  po once Called to [place] patient, 07/12/15 [place], [place] 07/12/2015, 11:02 AM

## 2015-07-12 NOTE — Progress Notes (Signed)
ED Antimicrobial Stewardship Positive Culture Follow Up   [name] is an 29 y.o. female who presented to [facility] on 07/09/2015 with a chief complaint of  Chief Complaint  Patient presents with  . Headache  . Sore Throat    Recent Results (from the past 720 hour(s))  Rapid strep screen     Status: None   Collection Time: 07/09/15  5:20 PM  Result Value Ref Range Status   Streptococcus, Group A Screen (Direct) NEGATIVE NEGATIVE Final    Comment: (NOTE) A Rapid Antigen test may result negative if the antigen level in the sample is below the detection level of this test. The FDA has not cleared this test as a stand-alone test therefore the rapid antigen negative result has reflexed to a Group A Strep culture.   Culture, Group A Strep     Status: Abnormal   Collection Time: 07/09/15  5:20 PM  Result Value Ref Range Status   Strep A Culture Positive (A)  Corrected    Comment: (NOTE) Penicillin and ampicillin are drugs of choice for treatment of beta-hemolytic streptococcal infections. Susceptibility testing of penicillins and other beta-lactam agents approved by the FDA for treatment of beta-hemolytic streptococcal infections need not be performed routinely because nonsusceptible isolates are extremely rare in any beta-hemolytic streptococcus and have not been reported for Streptococcus pyogenes (group A). (CLSI 2011) Performed At: [facility] [address] [place], [place] [id number] Lindon Romp MD [id number] CORRECTED ON 12/14 AT [id number]: PREVIOUSLY REPORTED AS Comment     [x]  Patient discharged originally without antimicrobial agent and treatment is now indicated  New antibiotic prescription: Amoxicillin 500mg  PO BID x 10 days  ED Provider: Margarita Mail PA-C   [name] 07/12/2015, 9:01 AM Infectious Diseases Pharmacist Phone# [phone]

## 2015-07-18 ENCOUNTER — Ambulatory Visit: Payer: 59 | Attending: Family Medicine

## 2015-07-25 ENCOUNTER — Encounter: Payer: Self-pay | Admitting: Family Medicine

## 2015-07-25 ENCOUNTER — Ambulatory Visit (INDEPENDENT_AMBULATORY_CARE_PROVIDER_SITE_OTHER): Payer: No Typology Code available for payment source | Admitting: Family Medicine

## 2015-07-25 VITALS — BP 134/88 | HR 102 | Temp 98.0°F | Resp 16 | Ht 66.0 in | Wt 150.0 lb

## 2015-07-25 DIAGNOSIS — IMO0002 Reserved for concepts with insufficient information to code with codable children: Secondary | ICD-10-CM | POA: Insufficient documentation

## 2015-07-25 DIAGNOSIS — Z0000 Encounter for general adult medical examination without abnormal findings: Secondary | ICD-10-CM

## 2015-07-25 DIAGNOSIS — B3731 Acute candidiasis of vulva and vagina: Secondary | ICD-10-CM

## 2015-07-25 DIAGNOSIS — E109 Type 1 diabetes mellitus without complications: Secondary | ICD-10-CM

## 2015-07-25 DIAGNOSIS — R519 Headache, unspecified: Secondary | ICD-10-CM | POA: Insufficient documentation

## 2015-07-25 DIAGNOSIS — N898 Other specified noninflammatory disorders of vagina: Secondary | ICD-10-CM

## 2015-07-25 DIAGNOSIS — R51 Headache: Secondary | ICD-10-CM

## 2015-07-25 DIAGNOSIS — E1065 Type 1 diabetes mellitus with hyperglycemia: Secondary | ICD-10-CM | POA: Insufficient documentation

## 2015-07-25 DIAGNOSIS — F32A Depression, unspecified: Secondary | ICD-10-CM

## 2015-07-25 DIAGNOSIS — G44209 Tension-type headache, unspecified, not intractable: Secondary | ICD-10-CM

## 2015-07-25 DIAGNOSIS — F411 Generalized anxiety disorder: Secondary | ICD-10-CM | POA: Insufficient documentation

## 2015-07-25 DIAGNOSIS — B373 Candidiasis of vulva and vagina: Secondary | ICD-10-CM

## 2015-07-25 DIAGNOSIS — IMO0001 Reserved for inherently not codable concepts without codable children: Secondary | ICD-10-CM

## 2015-07-25 DIAGNOSIS — L298 Other pruritus: Secondary | ICD-10-CM

## 2015-07-25 DIAGNOSIS — F329 Major depressive disorder, single episode, unspecified: Secondary | ICD-10-CM

## 2015-07-25 DIAGNOSIS — K029 Dental caries, unspecified: Secondary | ICD-10-CM

## 2015-07-25 LAB — CBC WITH DIFFERENTIAL/PLATELET
Basophils Absolute: 0 10*3/uL (ref 0.0–0.1)
Basophils Relative: 1 % (ref 0–1)
Eosinophils Absolute: 0 10*3/uL (ref 0.0–0.7)
Eosinophils Relative: 1 % (ref 0–5)
HCT: 38.1 % (ref 36.0–46.0)
Hemoglobin: 12.8 g/dL (ref 12.0–15.0)
Lymphocytes Relative: 42 % (ref 12–46)
Lymphs Abs: 1.8 10*3/uL (ref 0.7–4.0)
MCH: 28.4 pg (ref 26.0–34.0)
MCHC: 33.6 g/dL (ref 30.0–36.0)
MCV: 84.7 fL (ref 78.0–100.0)
MPV: 9.4 fL (ref 8.6–12.4)
Monocytes Absolute: 0.3 10*3/uL (ref 0.1–1.0)
Monocytes Relative: 7 % (ref 3–12)
Neutro Abs: 2.1 10*3/uL (ref 1.7–7.7)
Neutrophils Relative %: 49 % (ref 43–77)
Platelets: 365 10*3/uL (ref 150–400)
RBC: 4.5 MIL/uL (ref 3.87–5.11)
RDW: 14.1 % (ref 11.5–15.5)
WBC: 4.2 10*3/uL (ref 4.0–10.5)

## 2015-07-25 LAB — COMPLETE METABOLIC PANEL WITH GFR
ALT: 16 U/L (ref 6–29)
AST: 17 U/L (ref 10–30)
Albumin: 4.1 g/dL (ref 3.6–5.1)
Alkaline Phosphatase: 59 U/L (ref 33–115)
BUN: 11 mg/dL (ref 7–25)
CO2: 23 mmol/L (ref 20–31)
Calcium: 9.2 mg/dL (ref 8.6–10.2)
Chloride: 101 mmol/L (ref 98–110)
Creat: 0.6 mg/dL (ref 0.50–1.10)
GFR, Est African American: >89 mL/min (ref >=60)
GFR, Est Non African American: >89 mL/min (ref >=60)
Glucose, Bld: 72 mg/dL (ref 65–99)
Potassium: 4.1 mmol/L (ref 3.5–5.3)
Sodium: 138 mmol/L (ref 135–146)
Total Bilirubin: 0.4 mg/dL (ref 0.2–1.2)
Total Protein: 7.2 g/dL (ref 6.1–8.1)

## 2015-07-25 LAB — POCT URINALYSIS DIP (DEVICE)
Bilirubin Urine: NEGATIVE
Glucose, UA: 500 mg/dL — AB
Hgb urine dipstick: NEGATIVE
Ketones, ur: 40 mg/dL — AB
Leukocytes, UA: NEGATIVE
Nitrite: NEGATIVE
Protein, ur: NEGATIVE mg/dL
Specific Gravity, Urine: 1.025 (ref 1.005–1.030)
Urobilinogen, UA: 0.2 mg/dL (ref 0.0–1.0)
pH: 5.5 (ref 5.0–8.0)

## 2015-07-25 LAB — POCT WET + KOH PREP: Trich by wet prep: ABSENT

## 2015-07-25 LAB — GLUCOSE, CAPILLARY: Glucose-Capillary: 87 mg/dL (ref 65–99)

## 2015-07-25 LAB — TSH: TSH: 0.845 u[IU]/mL (ref 0.350–4.500)

## 2015-07-25 MED ORDER — INSULIN ASPART 100 UNIT/ML ~~LOC~~ SOLN: 0.0000 [IU] | Freq: Three times a day (TID) | SUBCUTANEOUS | Status: DC

## 2015-07-25 MED ORDER — "INSULIN SYRINGE 29G X 1/2"" 0.5 ML MISC": 1.0000 | Freq: Three times a day (TID) | Status: DC

## 2015-07-25 MED ORDER — BUTALBITAL-APAP-CAFFEINE 50-325-40 MG PO TABS: 1.0000 | ORAL_TABLET | Freq: Four times a day (QID) | ORAL | Status: DC | PRN

## 2015-07-25 MED ORDER — FLUCONAZOLE 150 MG PO TABS: 150.0000 mg | ORAL_TABLET | Freq: Once | ORAL | Status: DC

## 2015-07-25 MED ORDER — ONDANSETRON HCL 4 MG PO TABS: 4.0000 mg | ORAL_TABLET | Freq: Three times a day (TID) | ORAL | Status: DC | PRN

## 2015-07-25 MED ORDER — BUSPIRONE HCL 5 MG PO TABS: 5.0000 mg | ORAL_TABLET | Freq: Two times a day (BID) | ORAL | Status: DC

## 2015-07-25 MED ORDER — TRUE METRIX AIR GLUCOSE METER DEVI: 1.0000 | Freq: Three times a day (TID) | Status: DC

## 2015-07-25 MED ORDER — LANCET DEVICE MISC: 1.0000 | Freq: Three times a day (TID) | Status: DC

## 2015-07-25 MED ORDER — OXYCODONE-ACETAMINOPHEN 5-325 MG PO TABS: 1.0000 | ORAL_TABLET | Freq: Three times a day (TID) | ORAL | Status: DC | PRN

## 2015-07-25 MED ORDER — GLUCOSE BLOOD VI STRP: ORAL_STRIP | Status: DC

## 2015-07-25 NOTE — Patient Instructions (Signed)
Referral Sent to endocrinology Recommend follow up with [facility] for anxiety Referral sent to dental clinic   Monilial Vaginitis Vaginitis in a soreness, swelling and redness (inflammation) of the vagina and vulva. Monilial vaginitis is not a sexually transmitted infection. CAUSES  Yeast vaginitis is caused by yeast (candida) that is normally found in your vagina. With a yeast infection, the candida has overgrown in number to a point that upsets the chemical balance. SYMPTOMS   White, thick vaginal discharge.  Swelling, itching, redness and irritation of the vagina and possibly the lips of the vagina (vulva).  Burning or painful urination.  Painful intercourse. DIAGNOSIS  Things that may contribute to monilial vaginitis are:  Postmenopausal and virginal states.  Pregnancy.  Infections.  Being tired, sick or stressed, especially if you had monilial vaginitis in the past.  Diabetes. Good control will help lower the chance.  Birth control pills.  Tight fitting garments.  Using bubble bath, feminine sprays, douches or deodorant tampons.  Taking certain medications that kill germs (antibiotics).  Sporadic recurrence can occur if you become ill. TREATMENT  Your caregiver will give you medication.  There are several kinds of anti monilial vaginal creams and suppositories specific for monilial vaginitis. For recurrent yeast infections, use a suppository or cream in the vagina 2 times a week, or as directed.  Anti-monilial or steroid cream for the itching or irritation of the vulva may also be used. Get your caregiver's permission.  Painting the vagina with methylene blue solution may help if the monilial cream does not work.  Eating yogurt may help prevent monilial vaginitis. HOME CARE INSTRUCTIONS   Finish all medication as prescribed.  Do not have sex until treatment is completed or after your caregiver tells you it is okay.  Take warm sitz  baths.  Do not douche.  Do not use tampons, especially scented ones.  Wear cotton underwear.  Avoid tight pants and panty hose.  Tell your sexual partner that you have a yeast infection. They should go to their caregiver if they have symptoms such as mild rash or itching.  Your sexual partner should be treated as well if your infection is difficult to eliminate.  Practice safer sex. Use condoms.  Some vaginal medications cause latex condoms to fail. Vaginal medications that harm condoms are:  Cleocin cream.  Butoconazole (Femstat).  Terconazole (Terazol) vaginal suppository.  Miconazole (Monistat) (may be purchased over the counter). SEEK MEDICAL CARE IF:   You have a temperature by mouth above 102 F (38.9 C).  The infection is getting worse after 2 days of treatment.  The infection is not getting better after 3 days of treatment.  You develop blisters in or around your vagina.  You develop vaginal bleeding, and it is not your menstrual period.  You have pain when you urinate.  You develop intestinal problems.  You have pain with sexual intercourse.   This information is not intended to replace advice given to you by your health care provider. Make sure you discuss any questions you have with your health care provider.   Document Released: 04/23/2005 Document Revised: 10/06/2011 Document Reviewed: 01/15/2015 Elsevier Interactive Patient Education 2016 [place] for Diabetes Mellitus Carbohydrate counting is a method for keeping track of the amount of carbohydrates you eat. Eating carbohydrates naturally increases the level of sugar (glucose) in your blood, so it is important for you to know the amount that is okay for you to have in every meal. Carbohydrate  counting helps keep the level of glucose in your blood within normal limits. The amount of carbohydrates allowed is different for every person. A dietitian can help you  calculate the amount that is right for you. Once you know the amount of carbohydrates you can have, you can count the carbohydrates in the foods you want to eat. Carbohydrates are found in the following foods:  Grains, such as breads and cereals.  Dried beans and soy products.  Starchy vegetables, such as potatoes, peas, and corn.  Fruit and fruit juices.  Milk and yogurt.  Sweets and snack foods, such as cake, cookies, candy, chips, soft drinks, and fruit drinks. CARBOHYDRATE COUNTING There are two ways to count the carbohydrates in your food. You can use either of the methods or a combination of both. Reading the "Nutrition Facts" on [place] The "Nutrition Facts" is an area that is included on the labels of almost all packaged food and beverages in the [place]. It includes the serving size of that food or beverage and information about the nutrients in each serving of the food, including the grams (g) of carbohydrate per serving.  Decide the number of servings of this food or beverage that you will be able to eat or drink. Multiply that number of servings by the number of grams of carbohydrate that is listed on the label for that serving. The total will be the amount of carbohydrates you will be having when you eat or drink this food or beverage. Learning Standard Serving Sizes of Food When you eat food that is not packaged or does not include "Nutrition Facts" on the label, you need to measure the servings in order to count the amount of carbohydrates.A serving of most carbohydrate-rich foods contains about 15 g of carbohydrates. The following list includes serving sizes of carbohydrate-rich foods that provide 15 g ofcarbohydrate per serving:   1 slice of bread (1 oz) or 1 six-inch tortilla.    of a hamburger bun or English muffin.  4-6 crackers.   cup unsweetened dry cereal.    cup hot cereal.   cup rice or pasta.    cup mashed potatoes or  of a large  baked potato.  1 cup fresh fruit or one small piece of fruit.    cup canned or frozen fruit or fruit juice.  1 cup milk.   cup plain fat-free yogurt or yogurt sweetened with artificial sweeteners.   cup cooked dried beans or starchy vegetable, such as peas, corn, or potatoes.  Decide the number of standard-size servings that you will eat. Multiply that number of servings by 15 (the grams of carbohydrates in that serving). For example, if you eat 2 cups of strawberries, you will have eaten 2 servings and 30 g of carbohydrates (2 servings x 15 g = 30 g). For foods such as soups and casseroles, in which more than one food is mixed in, you will need to count the carbohydrates in each food that is included. EXAMPLE OF CARBOHYDRATE COUNTING Sample Dinner  3 oz chicken breast.   cup of brown rice.   cup of corn.  1 cup milk.   1 cup strawberries with sugar-free whipped topping.  Carbohydrate Calculation Step 1: Identify the foods that contain carbohydrates:   Rice.   Corn.   Milk.   Strawberries. Step 2:Calculate the number of servings eaten of each:   2 servings of rice.   1 serving of corn.   1 serving of milk.  1 serving of strawberries. Step 3: Multiply each of those number of servings by 15 g:   2 servings of rice x 15 g = 30 g.   1 serving of corn x 15 g = 15 g.   1 serving of milk x 15 g = 15 g.   1 serving of strawberries x 15 g = 15 g. Step 4: Add together all of the amounts to find the total grams of carbohydrates eaten: 30 g + 15 g + 15 g + 15 g = 75 g.   This information is not intended to replace advice given to you by your health care provider. Make sure you discuss any questions you have with your health care provider.   Document Released: 07/14/2005 Document Revised: 08/04/2014 Document Reviewed: 06/10/2013 Elsevier Interactive Patient Education 2016 [employer]. Diabetes and Exercise Exercising regularly is important. It is  not just about losing weight. It has many health benefits, such as:  Improving your overall fitness, flexibility, and endurance.  Increasing your bone density.  Helping with weight control.  Decreasing your body fat.  Increasing your muscle strength.  Reducing stress and tension.  Improving your overall health. People with diabetes who exercise gain additional benefits because exercise:  Reduces appetite.  Improves the body's use of blood sugar (glucose).  Helps lower or control blood glucose.  Decreases blood pressure.  Helps control blood lipids (such as cholesterol and triglycerides).  Improves the body's use of the hormone insulin by:  Increasing the body's insulin sensitivity.  Reducing the body's insulin needs.  Decreases the risk for heart disease because exercising:  Lowers cholesterol and triglycerides levels.  Increases the levels of good cholesterol (such as high-density lipoproteins [HDL]) in the body.  Lowers blood glucose levels. YOUR ACTIVITY PLAN  Choose an activity that you enjoy, and set realistic goals. To exercise safely, you should begin practicing any new physical activity slowly, and gradually increase the intensity of the exercise over time. Your health care provider or diabetes educator can help create an activity plan that works for you. General recommendations include:  Encouraging children to engage in at least 60 minutes of physical activity each day.  Stretching and performing strength training exercises, such as yoga or weight lifting, at least 2 times per week.  Performing a total of at least 150 minutes of moderate-intensity exercise each week, such as brisk walking or water aerobics.  Exercising at least 3 days per week, making sure you allow no more than 2 consecutive days to pass without exercising.  Avoiding long periods of inactivity (90 minutes or more). When you have to spend an extended period of time sitting down, take  frequent breaks to walk or stretch. RECOMMENDATIONS FOR EXERCISING WITH TYPE 1 OR TYPE 2 DIABETES   Check your blood glucose before exercising. If blood glucose levels are greater than 240 mg/dL, check for urine ketones. Do not exercise if ketones are present.  Avoid injecting insulin into areas of the body that are going to be exercised. For example, avoid injecting insulin into:  The arms when playing tennis.  The legs when jogging.  Keep a record of:  Food intake before and after you exercise.  Expected peak times of insulin action.  Blood glucose levels before and after you exercise.  The type and amount of exercise you have done.  Review your records with your health care provider. Your health care provider will help you to develop guidelines for adjusting food intake and insulin  amounts before and after exercising.  If you take insulin or oral hypoglycemic agents, watch for signs and symptoms of hypoglycemia. They include:  Dizziness.  Shaking.  Sweating.  Chills.  Confusion.  Drink plenty of water while you exercise to prevent dehydration or heat stroke. Body water is lost during exercise and must be replaced.  Talk to your health care provider before starting an exercise program to make sure it is safe for you. Remember, almost any type of activity is better than none.   This information is not intended to replace advice given to you by your health care provider. Make sure you discuss any questions you have with your health care provider.   Document Released: 10/04/2003 Document Revised: 11/28/2014 Document Reviewed: 12/21/2012 Elsevier Interactive Patient Education 2016 [employer]. Diabetes Mellitus and Food It is important for you to manage your blood sugar (glucose) level. Your blood glucose level can be greatly affected by what you eat. Eating healthier foods in the appropriate amounts throughout the day at about the same time each day will help you control  your blood glucose level. It can also help slow or prevent worsening of your diabetes mellitus. Healthy eating may even help you improve the level of your blood pressure and reach or maintain a healthy weight.  General recommendations for healthful eating and cooking habits include:  Eating meals and snacks regularly. Avoid going long periods of time without eating to lose weight.  Eating a diet that consists mainly of plant-based foods, such as fruits, vegetables, nuts, legumes, and whole grains.  Using low-heat cooking methods, such as baking, instead of high-heat cooking methods, such as deep frying. Work with your dietitian to make sure you understand how to use the Nutrition Facts information on food labels. HOW CAN FOOD AFFECT ME? Carbohydrates Carbohydrates affect your blood glucose level more than any other type of food. Your dietitian will help you determine how many carbohydrates to eat at each meal and teach you how to count carbohydrates. Counting carbohydrates is important to keep your blood glucose at a healthy level, especially if you are using insulin or taking certain medicines for diabetes mellitus. Alcohol Alcohol can cause sudden decreases in blood glucose (hypoglycemia), especially if you use insulin or take certain medicines for diabetes mellitus. Hypoglycemia can be a life-threatening condition. Symptoms of hypoglycemia (sleepiness, dizziness, and disorientation) are similar to symptoms of having too much alcohol.  If your health care provider has given you approval to drink alcohol, do so in moderation and use the following guidelines:  Women should not have more than one drink per day, and men should not have more than two drinks per day. One drink is equal to:  12 oz of beer.  5 oz of wine.  1 oz of hard liquor.  Do not drink on an empty stomach.  Keep yourself hydrated. Have water, diet soda, or unsweetened iced tea.  Regular soda, juice, and other mixers might  contain a lot of carbohydrates and should be counted. WHAT FOODS ARE NOT RECOMMENDED? As you make food choices, it is important to remember that all foods are not the same. Some foods have fewer nutrients per serving than other foods, even though they might have the same number of calories or carbohydrates. It is difficult to get your body what it needs when you eat foods with fewer nutrients. Examples of foods that you should avoid that are high in calories and carbohydrates but low in nutrients include:  Trans fats (  most processed foods list trans fats on the Nutrition Facts label).  Regular soda.  Juice.  Candy.  Sweets, such as cake, pie, doughnuts, and cookies.  Fried foods. WHAT FOODS CAN I EAT? Eat nutrient-rich foods, which will nourish your body and keep you healthy. The food you should eat also will depend on several factors, including:  The calories you need.  The medicines you take.  Your weight.  Your blood glucose level.  Your blood pressure level.  Your cholesterol level. You should eat a variety of foods, including:  Protein.  Lean cuts of meat.  Proteins low in saturated fats, such as fish, egg whites, and beans. Avoid processed meats.  Fruits and vegetables.  Fruits and vegetables that may help control blood glucose levels, such as apples, mangoes, and yams.  Dairy products.  Choose fat-free or low-fat dairy products, such as milk, yogurt, and cheese.  Grains, bread, pasta, and rice.  Choose whole grain products, such as multigrain bread, whole oats, and brown rice. These foods may help control blood pressure.  Fats.  Foods containing healthful fats, such as nuts, avocado, olive oil, canola oil, and fish. DOES EVERYONE WITH DIABETES MELLITUS HAVE THE SAME MEAL PLAN? Because every person with diabetes mellitus is different, there is not one meal plan that works for everyone. It is very important that you meet with a dietitian who will help you  create a meal plan that is just right for you.   This information is not intended to replace advice given to you by your health care provider. Make sure you discuss any questions you have with your health care provider.   Document Released: 04/10/2005 Document Revised: 08/04/2014 Document Reviewed: 06/10/2013 Elsevier Interactive Patient Education 2016 [employer] Inc. Type 1 Diabetes Mellitus, Adult Type 1 diabetes mellitus, often simply referred to as diabetes, is a long-term (chronic) disease. It occurs when the islet cells in the pancreas that make insulin (a hormone) are destroyed and can no longer make insulin. Insulin is needed to move sugars from food into the tissue cells. The tissue cells use the sugars for energy. In people with type 1 diabetes, the sugars build up in the blood instead of going into the tissue cells. As a result, high blood sugar (hyperglycemia) develops. Without insulin, the body breaks down fat cells for the needed energy. This breakdown of fat cells produces acid chemicals (ketones), which increases the acid levels in the body. The effect of either high ketone or high sugar (glucose) levels can be life-threatening.  Type 1 diabetes was also previously called juvenile diabetes. It most often occurs before the age of 82, but it can occur at any age. RISK FACTORS A person is predisposed to developing type 1 diabetes if someone in his or her family has the disease and is exposed to certain additional environmental triggers.  SYMPTOMS  Symptoms of type 1 diabetes may develop gradually over days to weeks or suddenly. The symptoms occur due to hyperglycemia. The symptoms can include:   Increased thirst (polydipsia).  Increased urination (polyuria).  Increased urination during the night (nocturia).  Weight loss. This weight loss may be rapid.  Frequent, recurring infections.  Tiredness (fatigue).  Weakness.  Vision changes, such as blurred vision.  Fruity smell to  your breath.  Abdominal pain.  Nausea or vomiting.  An open skin wound (ulcer). DIAGNOSIS  Type 1 diabetes is diagnosed when symptoms of diabetes are present and when blood glucose levels are increased. Your blood glucose level  may be checked by one or more of the following blood tests:  A fasting blood glucose test. You will not be allowed to eat for at least 8 hours before a blood sample is taken.  A random blood glucose test. Your blood glucose is checked at any time of the day regardless of when you ate.  A hemoglobin A1c blood glucose test. A hemoglobin A1c test provides information about blood glucose control over the previous 3 months. TREATMENT  Although type 1 diabetes cannot be prevented, it can be managed with insulin, diet, and exercise.  You will need to take insulin daily to keep blood glucose in the desired range.  You will need to match insulin dosing with exercise and healthy food choices. Generally, the goal of treatment is to maintain a pre-meal (preprandial) blood glucose level of 80-130 mg/dL. HOME CARE INSTRUCTIONS   Have your hemoglobin A1c level checked twice a year.  Perform daily blood glucose monitoring as directed by your health care provider.  Monitor urine ketones when you are ill and as directed by your health care provider.  Take your insulin as directed by your health care provider to maintain your blood glucose level in the desired range.  Never run out of insulin. It is needed every day.  Adjust insulin based on your intake of carbohydrates. Carbohydrates can raise blood glucose levels but need to be included in your diet. Carbohydrates provide vitamins, minerals, and fiber, which are an essential part of a healthy diet. Carbohydrates are found in fruits, vegetables, whole grains, dairy products, legumes, and foods containing added sugars.  Eat healthy foods. Alternate 3 meals with 3 snacks.  Maintain a healthy weight.  Carry a medical alert  card or wear your medical alert jewelry.  Carry a 15-gram carbohydrate snack with you at all times to treat low blood glucose (hypoglycemia). Some examples of 15-gram carbohydrate snacks include:  Glucose tablets, 3 or 4.  Glucose gel, 15-gram tube.  Raisins, 2 tablespoons (24 grams).  Jelly beans, 6.  Animal crackers, 8.  Fruit juice, regular soda, or low-fat milk, 4 ounces (120 mL).  Gummy treats, 9.  Recognize hypoglycemia. Hypoglycemia occurs with blood glucose levels of 70 mg/dL and below. The risk for hypoglycemia increases when fasting or skipping meals, during or after intense exercise, and during sleep. Hypoglycemia symptoms can include:  Tremors or shakes.  Decreased ability to concentrate.  Sweating.  Increased heart rate.  Headache.  Dry mouth.  Hunger.  Irritability.  Anxiety.  Restless sleep.  Altered speech or coordination.  Confusion.  Treat hypoglycemia promptly. If you are alert and able to safely swallow, follow the 15:15 rule:  Take 15-20 grams of rapid-acting glucose or carbohydrate. Rapid-acting options include glucose gel, glucose tablets, or 4 ounces (120 mL) of fruit juice, regular soda, or low-fat milk.  Check your blood glucose level 15 minutes after taking the glucose.  Take 15-20 grams more of glucose if the repeat blood glucose level is still 70 mg/dL or below.  Eat a meal or snack within 1 hour once blood glucose levels return to normal.  Be alert to polyuria and polydipsia, which are early signs of hyperglycemia. An early awareness of hyperglycemia allows for prompt treatment. Treat hyperglycemia as directed by your health care provider.  Exercise regularly as directed by your health care provider. This includes:  Stretching and performing strength training exercises, such as yoga or weight lifting, at least 2 times per week.  Performing a total of at  least 150 minutes of moderate-intensity exercise each week, such as brisk  walking or water aerobics.  Exercising at least 3 days per week, making sure you allow no more than 2 consecutive days to pass without exercising.  Avoiding long periods of inactivity (90 minutes or more). When you have to spend an extended period of time sitting down, take frequent breaks to walk or stretch.  Adjust your insulin dosing and food intake as needed if you start a new exercise or sport.  Follow your sick-day plan at any time you are unable to eat or drink as usual.   Do not use any tobacco products including cigarettes, chewing tobacco, or electronic cigarettes. If you need help quitting, ask your health care provider.  Limit alcohol intake to no more than 1 drink per day for nonpregnant women and 2 drinks per day for men. You should drink alcohol only when you are also eating food. Talk with your health care provider about whether alcohol is safe for you. Tell your health care provider if you drink alcohol several times a week.  Keep all follow-up visits as directed by your health care provider.  Schedule an eye exam within 5 years of diagnosis and then annually.  Perform daily skin and foot care. Examine your skin and feet daily for cuts, bruises, redness, nail problems, bleeding, blisters, or sores. A foot exam should be done by a health care provider 5 years after diagnosis, and then every year after the first exam.  Brush your teeth and gums at least twice a day and floss at least once a day. Follow up with your dentist regularly.  Share your diabetes management plan with your workplace or school.  Keep your immunizations up to date. It is recommended that you receive a flu (influenza) vaccine every year. It is also recommended that you receive a pneumonia (pneumococcal) vaccine. If you are 42 years of age or older and have never received a pneumonia vaccine, this vaccine may be given as a series of two separate shots. Ask your health care provider which additional vaccines  may be recommended.  Learn to manage stress.  Obtain ongoing diabetes education and support as needed.  Participate in or seek rehabilitation as needed to maintain or improve independence and quality of life. Request a physical or occupational therapy referral if you are having foot or hand numbness, or difficulties with grooming, dressing, eating, or physical activity. SEEK MEDICAL CARE IF:   You are unable to eat food or drink fluids for more than 6 hours.  You have nausea and vomiting for more than 6 hours.  Your blood glucose level is over 240 mg/dL.  There is a change in mental status.  You develop an additional serious illness.  You have diarrhea for more than 6 hours.  You have been sick or have had a fever for a couple of days and are not getting better.  You have pain during any physical activity. SEEK IMMEDIATE MEDICAL CARE IF:  You have difficulty breathing.  You have moderate to large ketone levels. MAKE SURE YOU:  Understand these instructions.  Will watch your condition.  Will get help right away if you are not doing well or get worse.   This information is not intended to replace advice given to you by your health care provider. Make sure you discuss any questions you have with your health care provider.   Document Released: 07/11/2000 Document Revised: 04/04/2015 Document Reviewed: 02/10/2012 Elsevier Interactive Patient Education  2016 [employer].
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
## 2015-07-26 ENCOUNTER — Telehealth: Payer: Self-pay | Admitting: Family Medicine

## 2015-07-26 ENCOUNTER — Other Ambulatory Visit: Payer: Self-pay | Admitting: Family Medicine

## 2015-07-26 ENCOUNTER — Other Ambulatory Visit: Payer: Self-pay

## 2015-07-26 DIAGNOSIS — G44209 Tension-type headache, unspecified, not intractable: Secondary | ICD-10-CM

## 2015-07-26 DIAGNOSIS — IMO0001 Reserved for inherently not codable concepts without codable children: Secondary | ICD-10-CM

## 2015-07-26 DIAGNOSIS — B373 Candidiasis of vulva and vagina: Secondary | ICD-10-CM

## 2015-07-26 DIAGNOSIS — B3731 Acute candidiasis of vulva and vagina: Secondary | ICD-10-CM

## 2015-07-26 DIAGNOSIS — E1065 Type 1 diabetes mellitus with hyperglycemia: Secondary | ICD-10-CM

## 2015-07-26 LAB — HEMOGLOBIN A1C
Hgb A1c MFr Bld: 10.2 % — ABNORMAL HIGH (ref <5.7)
Mean Plasma Glucose: 246 mg/dL — ABNORMAL HIGH (ref <117)

## 2015-07-26 MED ORDER — FLUCONAZOLE 150 MG PO TABS: 150.0000 mg | ORAL_TABLET | Freq: Once | ORAL | Status: DC

## 2015-07-26 MED ORDER — BUTALBITAL-APAP-CAFFEINE 50-325-40 MG PO TABS: 1.0000 | ORAL_TABLET | Freq: Four times a day (QID) | ORAL | Status: DC | PRN

## 2015-07-26 MED ORDER — INSULIN LISPRO 100 UNIT/ML ~~LOC~~ SOLN: 0.0000 [IU] | Freq: Three times a day (TID) | SUBCUTANEOUS | Status: DC

## 2015-07-26 NOTE — Progress Notes (Signed)
Reviewed labs, hemoglobin A1C is 10.2. Will start Humalog as bolus insulin for meal coverage. Patient to check blood sugars prior to meals. Start 15: 1 carbohydrate ratio. For every 15 grams of carbohydrates, she will require 1 unit of insulin. Patient will need a basal insulin, she is concerned about affordability due to lack of payer source. Will contact the pharmacy at [facility]. [name] is to follow up in office in 1 month.  Meds ordered this encounter  Medications  . butalbital-acetaminophen-caffeine (FIORICET) 50-325-40 MG tablet    Sig: Take 1 tablet by mouth every 6 (six) hours as needed for headache.    Dispense:  30 tablet    Refill:  0  . insulin lispro (HUMALOG) 100 UNIT/ML injection    Sig: Inject 0-0.1 mLs (0-10 Units total) into the skin 3 (three) times daily before meals. Reported on 07/25/2015    Dispense:  10 mL    Refill:  2    15:1 carbohydrate ratio   Joy Cisse M, FNP

## 2015-07-26 NOTE — Telephone Encounter (Signed)
Patient has questions regarding insulin dosage. Please call.

## 2015-07-26 NOTE — Telephone Encounter (Signed)
Patient is asking if she can stay on Humalog, since her pharmacy doesn't have the other insulin and it would require her to go through the manufacture to get it at an affordable rate. Please advise. Thanks!

## 2015-08-01 MED FILL — TRUE METRIX TEST STRIP: 25 days supply | Qty: 100 | Fill #1

## 2015-08-08 ENCOUNTER — Telehealth: Payer: Self-pay | Admitting: Family Medicine

## 2015-08-08 NOTE — Telephone Encounter (Signed)
Called and left message for patient to call back regarding this message. Thanks!

## 2015-08-08 NOTE — Telephone Encounter (Signed)
Patient would like RX for long acting insulin other than Lantus.

## 2015-08-08 NOTE — Telephone Encounter (Signed)
Patient returned call and she has agreed to try Levemir, can you please send this in. Thanks!

## 2015-08-09 ENCOUNTER — Other Ambulatory Visit: Payer: Self-pay | Admitting: Family Medicine

## 2015-08-09 DIAGNOSIS — E1065 Type 1 diabetes mellitus with hyperglycemia: Principal | ICD-10-CM

## 2015-08-09 DIAGNOSIS — IMO0001 Reserved for inherently not codable concepts without codable children: Secondary | ICD-10-CM

## 2015-08-09 MED ORDER — INSULIN DETEMIR 100 UNIT/ML FLEXPEN: 10.0000 [IU] | Freq: Every day | SUBCUTANEOUS | Status: DC

## 2015-08-09 MED FILL — !LEVEMIR 100 UNITS/ML VIAL: 100/ML | 90 days supply | Qty: 10 | Fill #0

## 2015-08-10 ENCOUNTER — Other Ambulatory Visit: Payer: Self-pay

## 2015-08-10 DIAGNOSIS — E1065 Type 1 diabetes mellitus with hyperglycemia: Principal | ICD-10-CM

## 2015-08-10 DIAGNOSIS — IMO0001 Reserved for inherently not codable concepts without codable children: Secondary | ICD-10-CM

## 2015-08-10 MED ORDER — INSULIN DETEMIR 100 UNIT/ML FLEXPEN: 10.0000 [IU] | Freq: Every day | SUBCUTANEOUS | Status: DC

## 2015-08-15 ENCOUNTER — Other Ambulatory Visit: Payer: Self-pay | Admitting: Internal Medicine

## 2015-08-15 DIAGNOSIS — IMO0001 Reserved for inherently not codable concepts without codable children: Secondary | ICD-10-CM

## 2015-08-15 DIAGNOSIS — E1065 Type 1 diabetes mellitus with hyperglycemia: Principal | ICD-10-CM

## 2015-08-15 MED ORDER — INSULIN LISPRO 100 UNIT/ML ~~LOC~~ SOLN: 0.0000 [IU] | Freq: Three times a day (TID) | SUBCUTANEOUS | Status: DC

## 2015-08-24 ENCOUNTER — Ambulatory Visit (INDEPENDENT_AMBULATORY_CARE_PROVIDER_SITE_OTHER): Payer: 59 | Admitting: Family Medicine

## 2015-08-24 VITALS — BP 138/93 | HR 96 | Temp 98.2°F | Resp 14 | Ht 66.0 in | Wt 163.0 lb

## 2015-08-24 DIAGNOSIS — F411 Generalized anxiety disorder: Secondary | ICD-10-CM | POA: Diagnosis not present

## 2015-08-24 DIAGNOSIS — E109 Type 1 diabetes mellitus without complications: Secondary | ICD-10-CM | POA: Diagnosis not present

## 2015-08-24 DIAGNOSIS — E1065 Type 1 diabetes mellitus with hyperglycemia: Secondary | ICD-10-CM

## 2015-08-24 DIAGNOSIS — IMO0001 Reserved for inherently not codable concepts without codable children: Secondary | ICD-10-CM

## 2015-08-24 LAB — POCT URINALYSIS DIP (DEVICE)
Bilirubin Urine: NEGATIVE
Glucose, UA: >=1000 mg/dL — AB
Hgb urine dipstick: NEGATIVE
Ketones, ur: NEGATIVE mg/dL
Leukocytes, UA: NEGATIVE
Nitrite: NEGATIVE
Protein, ur: NEGATIVE mg/dL
Specific Gravity, Urine: >=1.03 (ref 1.005–1.030)
Urobilinogen, UA: 0.2 mg/dL (ref 0.0–1.0)
pH: 5.5 (ref 5.0–8.0)

## 2015-08-24 LAB — GLUCOSE, CAPILLARY: Glucose-Capillary: 173 mg/dL — ABNORMAL HIGH (ref 65–99)

## 2015-08-24 MED ORDER — BUSPIRONE HCL 5 MG PO TABS: 5.0000 mg | ORAL_TABLET | Freq: Two times a day (BID) | ORAL | Status: DC

## 2015-08-24 NOTE — Patient Instructions (Signed)

## 2015-08-24 NOTE — Progress Notes (Signed)
Subjective:    Patient ID: [name], female    DOB: Aug 22, 1985, 30 y.o.   MRN: [id number]  HPI  [name], a 30 year old female with a history of type I diabetes presents for a 1 month follow up of diabetes type 1 and GAD. Her initial diagnosis of type I diabetes was made at age 4. Her clinical course has fluctuated due to non adherance to medication regimen.  She maintains that she has been using carbohydrate ration with meal coverage, but she has been using Levemir several times per day, which was not prescribed. She is knowledgable of basic carbohydrate counting; however, patient has been changing medication regimen without consulting provider. She states that her typical symptom of hypoglycemia is mouth tingling, jitteriness and sweating.    Patient is also follow up after starting medications for anxiety. She was started on Buspar 1 month ago and states that medication regimen has been effective. She denies symptoms  of anhedonia, depressed mood and difficulty concentrating.   She  Also denies current suicidal and homicidal plan or intent. Past Medical History  Diagnosis Date  . Diabetes mellitus without complication ([place])    There is no immunization history on file for this patient.  No Known Allergies  Social History   Social History  . Marital Status: Single    Spouse Name: N/A  . Number of Children: N/A  . Years of Education: N/A   Occupational History  . Not on file.   Social History Main Topics  . Smoking status: Never Smoker   . Smokeless tobacco: Never Used  . Alcohol Use: Yes     Comment: occasionally   . Drug Use: No  . Sexual Activity: Not on file   Other Topics Concern  . Not on file   Social History Narrative   Review of Systems  Constitutional: Negative for fatigue.  Eyes: Negative for visual disturbance.  Respiratory: Negative.  Negative for shortness of breath.   Cardiovascular: Negative.  Negative for chest pain, palpitations and leg  swelling.  Gastrointestinal: Negative.   Endocrine: Negative for cold intolerance, heat intolerance, polydipsia, polyphagia and polyuria.  Genitourinary: Negative for urgency, vaginal discharge, vaginal pain and pelvic pain.  Neurological: Negative for dizziness, tremors, facial asymmetry, weakness and numbness.  Hematological: Negative.   Psychiatric/Behavioral: Negative for suicidal ideas and sleep disturbance.      Objective:   Physical Exam  Constitutional: She is oriented to person, place, and time. She appears well-developed and well-nourished.  HENT:  Head: Normocephalic and atraumatic.  Right Ear: External ear normal.  Left Ear: External ear normal.  Nose: Nose normal.  Mouth/Throat: Oropharynx is clear and moist.  Eyes: Conjunctivae and EOM are normal. Pupils are equal, round, and reactive to light.  Neck: Trachea normal and normal range of motion. Neck supple. No thyroid mass present.  Cardiovascular: Normal rate, regular rhythm, normal heart sounds and intact distal pulses.   Pulmonary/Chest: Effort normal and breath sounds normal.  Abdominal: Soft. Bowel sounds are normal.  Musculoskeletal: Normal range of motion.  Neurological: She is alert and oriented to person, place, and time. She has normal reflexes. She is not disoriented. She displays no atrophy. No sensory deficit. She exhibits normal muscle tone. Gait normal.  Skin: Skin is warm and dry.     Psychiatric: Her behavior is normal. Thought content normal.     BP 138/93 mmHg  Pulse 96  Temp(Src) 98.2 F (36.8 C) (Oral)  Resp 14  Ht 5\' 6"  (1.676 [name])  Wt 163 lb (73.936 kg)  BMI 26.32 kg/m2  LMP 08/24/2015 Assessment & Plan:  1. Uncontrolled type 1 diabetes mellitus without complication [facility]) [name] has been taking medications incorrectly. She and I discussed medications at length.  She will continue humalog for meal coverage. We discussed calculating insulin carbohydrate ratio. Also, she will only take  Levemir 10 units HS. Will send referral to endocrinologist for further evaluation.  I have discussed with her the great importance of following the treatment plan exactly as directed in order to achieve a good medical outcome. - Glucose (CBG) - POCT urinalysis dipstick - Glucose, capillary - POCT urinalysis dip (device) - Ambulatory referral to Endocrinology  2. Generalized anxiety disorder Recently started on Buspar 5 mg BID. Patient reports that buspar has been effective. Recommend that patient start a long term medication to assist with anxiety and depression. She currently denies suicidal or homicidal intent.   - busPIRone (BUSPAR) 5 MG tablet; Take 1 tablet (5 mg total) by mouth 2 (two) times daily.  Dispense: 60 tablet; Refill: 5 Joy M, FNP

## 2015-08-26 ENCOUNTER — Encounter: Payer: Self-pay | Admitting: Family Medicine

## 2015-08-30 MED FILL — !LEVEMIR 100 UNITS/ML VIAL: 100/ML | 90 days supply | Qty: 10 | Fill #1

## 2015-09-10 MED FILL — TRUE METRIX TEST STRIP: 50 days supply | Qty: 200 | Fill #2

## 2015-09-19 ENCOUNTER — Other Ambulatory Visit: Payer: Self-pay | Admitting: Family Medicine

## 2015-09-19 ENCOUNTER — Telehealth: Payer: Self-pay | Admitting: *Deleted

## 2015-09-19 DIAGNOSIS — E1065 Type 1 diabetes mellitus with hyperglycemia: Principal | ICD-10-CM

## 2015-09-19 DIAGNOSIS — IMO0001 Reserved for inherently not codable concepts without codable children: Secondary | ICD-10-CM

## 2015-09-19 NOTE — Telephone Encounter (Signed)
Pt called and states she would like a referral to the eye doctor. States her eyes are bothering her. Pt has the orange card. Thanks

## 2015-09-19 NOTE — Telephone Encounter (Signed)
[place] can this be done? Please advise. Thanks! LOV:08/24/2015.

## 2015-10-11 ENCOUNTER — Ambulatory Visit (INDEPENDENT_AMBULATORY_CARE_PROVIDER_SITE_OTHER): Payer: 59 | Admitting: Family Medicine

## 2015-10-11 ENCOUNTER — Encounter: Payer: Self-pay | Admitting: Family Medicine

## 2015-10-11 ENCOUNTER — Other Ambulatory Visit (HOSPITAL_COMMUNITY)
Admission: RE | Admit: 2015-10-11 | Discharge: 2015-10-11 | Disposition: A | Discharge status: Home / Self Care | Payer: No Typology Code available for payment source | Source: Ambulatory Visit | Attending: Family Medicine | Admitting: Family Medicine

## 2015-10-11 VITALS — BP 132/93 | HR 104 | Temp 98.7°F | Resp 16 | Ht 66.0 in | Wt 159.0 lb

## 2015-10-11 DIAGNOSIS — Z01419 Encounter for gynecological examination (general) (routine) without abnormal findings: Secondary | ICD-10-CM

## 2015-10-11 DIAGNOSIS — J309 Allergic rhinitis, unspecified: Secondary | ICD-10-CM | POA: Diagnosis not present

## 2015-10-11 DIAGNOSIS — Z113 Encounter for screening for infections with a predominantly sexual mode of transmission: Secondary | ICD-10-CM | POA: Insufficient documentation

## 2015-10-11 DIAGNOSIS — N760 Acute vaginitis: Secondary | ICD-10-CM | POA: Insufficient documentation

## 2015-10-11 LAB — POCT URINALYSIS DIP (DEVICE)
Bilirubin Urine: NEGATIVE
Glucose, UA: 100 mg/dL — AB
Hgb urine dipstick: NEGATIVE
Ketones, ur: NEGATIVE mg/dL
Leukocytes, UA: NEGATIVE
Nitrite: NEGATIVE
Protein, ur: NEGATIVE mg/dL
Specific Gravity, Urine: >=1.03 (ref 1.005–1.030)
Urobilinogen, UA: 0.2 mg/dL (ref 0.0–1.0)
pH: 5.5 (ref 5.0–8.0)

## 2015-10-11 MED ORDER — LORATADINE 10 MG PO TABS: 10.0000 mg | ORAL_TABLET | Freq: Every day | ORAL | Status: DC

## 2015-10-11 NOTE — Progress Notes (Signed)
Subjective:    Patient ID: [name], female    DOB: February 04, 1986, 30 y.o.   MRN: [id number]  Gynecologic Exam The patient's pertinent negatives include no genital itching, genital lesions, genital odor, genital rash, missed menses, pelvic pain, vaginal bleeding or vaginal discharge. She is not pregnant. Associated symptoms include a sore throat. Pertinent negatives include no abdominal pain, anorexia, back pain, chills, constipation, diarrhea, discolored urine, dysuria, fever, flank pain, frequency, headaches, hematuria, joint pain, joint swelling, nausea, painful intercourse, rash or urgency. She is not sexually active. No, her partner does not have an STD. She uses nothing for contraception. Her menstrual history has been regular. There is no history of an abdominal surgery, a Cesarean section, an ectopic pregnancy, endometriosis, a gynecological surgery, herpes simplex, menorrhagia, metrorrhagia, miscarriage, ovarian cysts, perineal abscess, PID, an STD, a terminated pregnancy or vaginosis.  Patient is also  here for evaluation of possible allergic rhinitis. Patient's symptoms include itchy palate, postnasal drip and sore throat. These symptoms are seasonal. Current triggers include exposure to pollens. The patient has been suffering from these symptoms for several weeks. The patient has not attempted any OTC interventions to alleviate symptoms.  Past Medical History  Diagnosis Date  . Diabetes mellitus without complication ([place])   No past surgical history on file.  No Known Allergies   There is no immunization history on file for this patient.  Social History   Social History  . Marital Status: Single    Spouse Name: N/A  . Number of Children: N/A  . Years of Education: N/A   Occupational History  . Not on file.   Social History Main Topics  . Smoking status: Never Smoker   . Smokeless tobacco: Never Used  . Alcohol Use: Yes     Comment: occasionally   . Drug Use: No  .  Sexual Activity: Not on file   Other Topics Concern  . Not on file   Social History Narrative     Review of Systems  Constitutional: Negative for fever and chills.  HENT: Positive for postnasal drip and sore throat.   Eyes: Negative.   Respiratory: Negative.   Gastrointestinal: Negative for nausea, abdominal pain, diarrhea, constipation and anorexia.  Endocrine: Negative.   Genitourinary: Negative.  Negative for dysuria, urgency, frequency, hematuria, flank pain, vaginal discharge, pelvic pain, menorrhagia and missed menses.  Musculoskeletal: Negative.  Negative for back pain and joint pain.  Skin: Negative.  Negative for rash.  Allergic/Immunologic: Negative.   Neurological: Negative.  Negative for headaches.  Hematological: Negative.   Psychiatric/Behavioral: Negative.        Objective:   Physical Exam  Constitutional: She is oriented to person, place, and time. She appears well-developed and well-nourished.  HENT:  Head: Normocephalic and atraumatic.  Right Ear: External ear normal.  Left Ear: External ear normal.  Mouth/Throat: Oropharynx is clear and moist.  Eyes: Conjunctivae and EOM are normal. Pupils are equal, round, and reactive to light.  Neck: Normal range of motion. Neck supple.  Cardiovascular: Normal rate, regular rhythm, normal heart sounds and intact distal pulses.   Pulmonary/Chest: Effort normal and breath sounds normal. Right breast exhibits no inverted nipple, no nipple discharge, no skin change and no tenderness. Left breast exhibits no inverted nipple, no nipple discharge, no skin change and no tenderness. Breasts are symmetrical.  Nipple piercings  Abdominal: Soft. Bowel sounds are normal. Hernia confirmed negative in the right inguinal area and confirmed negative in the left inguinal area.  Genitourinary:  There is no rash, tenderness, lesion or injury on the right labia. There is no rash, tenderness, lesion or injury on the left labia. Cervix exhibits  friability. Right adnexum displays no mass and no tenderness. Left adnexum displays no mass and no tenderness. Vaginal discharge found.  2 vaginal piercings  Neurological: She is alert and oriented to person, place, and time. She has normal reflexes.  Skin: Skin is warm.  Psychiatric: She has a normal mood and affect. Her behavior is normal. Judgment and thought content normal.     BP 132/93 mmHg  Pulse 104  Temp(Src) 98.7 F (37.1 C) (Oral)  Resp 16  Ht 5\' 6"  (1.676 m)  Wt 159 lb (72.122 kg)  BMI 25.68 kg/m2  LMP 09/22/2015 Assessment & Plan:  1. Encounter for routine gynecological examination Patient has piercing to clitoris, discussed the importance of vaginal hygiene in order to prevent infections. Reminded of the importance of barrier protection with sexual intercourse.  - Cytology - PAP [place] - POCT urinalysis dipstick  2. Allergic rhinitis, unspecified allergic rhinitis type - loratadine (CLARITIN) 10 MG tablet; Take 1 tablet (10 mg total) by mouth daily.  Dispense: 30 tablet; Refill: 11    RTC: Diabetes mellitus in 2 months   The patient was given clear instructions to go to ER or return to medical center if symptoms do not improve, worsen or new problems develop. The patient verbalized understanding. Will notify patient with pap smear results    [name], FNP

## 2015-10-15 LAB — CYTOLOGY - PAP

## 2015-10-16 LAB — CERVICOVAGINAL ANCILLARY ONLY
Bacterial vaginitis: NEGATIVE
Candida vaginitis: NEGATIVE

## 2015-10-22 MED FILL — !NOVOLOG 100UNITS/ML VIAL: 100/ML | 28 days supply | Qty: 10 | Fill #1

## 2015-12-11 ENCOUNTER — Ambulatory Visit (INDEPENDENT_AMBULATORY_CARE_PROVIDER_SITE_OTHER): Payer: No Typology Code available for payment source | Admitting: Family Medicine

## 2015-12-11 ENCOUNTER — Encounter: Payer: Self-pay | Admitting: Family Medicine

## 2015-12-11 VITALS — BP 129/80 | HR 110 | Temp 98.1°F | Resp 18 | Ht 66.0 in | Wt 157.0 lb

## 2015-12-11 DIAGNOSIS — E1065 Type 1 diabetes mellitus with hyperglycemia: Principal | ICD-10-CM

## 2015-12-11 DIAGNOSIS — R81 Glycosuria: Secondary | ICD-10-CM

## 2015-12-11 DIAGNOSIS — IMO0001 Reserved for inherently not codable concepts without codable children: Secondary | ICD-10-CM

## 2015-12-11 DIAGNOSIS — R0982 Postnasal drip: Secondary | ICD-10-CM

## 2015-12-11 DIAGNOSIS — L700 Acne vulgaris: Secondary | ICD-10-CM

## 2015-12-11 DIAGNOSIS — E109 Type 1 diabetes mellitus without complications: Secondary | ICD-10-CM

## 2015-12-11 DIAGNOSIS — Z9109 Other allergy status, other than to drugs and biological substances: Secondary | ICD-10-CM

## 2015-12-11 DIAGNOSIS — Z91048 Other nonmedicinal substance allergy status: Secondary | ICD-10-CM

## 2015-12-11 LAB — POCT URINALYSIS DIP (DEVICE)
Bilirubin Urine: NEGATIVE
Glucose, UA: 500 mg/dL — AB
Hgb urine dipstick: NEGATIVE
Ketones, ur: 40 mg/dL — AB
Leukocytes, UA: NEGATIVE
Nitrite: NEGATIVE
Protein, ur: NEGATIVE mg/dL
Specific Gravity, Urine: 1.01 (ref 1.005–1.030)
Urobilinogen, UA: 0.2 mg/dL (ref 0.0–1.0)
pH: 5.5 (ref 5.0–8.0)

## 2015-12-11 LAB — HEMOGLOBIN A1C
Hgb A1c MFr Bld: 10 % — ABNORMAL HIGH (ref <5.7)
Mean Plasma Glucose: 240 mg/dL

## 2015-12-11 LAB — COMPLETE METABOLIC PANEL WITH GFR
ALT: 15 U/L (ref 6–29)
AST: 16 U/L (ref 10–30)
Albumin: 4.1 g/dL (ref 3.6–5.1)
Alkaline Phosphatase: 62 U/L (ref 33–115)
BUN: 11 mg/dL (ref 7–25)
CO2: 23 mmol/L (ref 20–31)
Calcium: 9.5 mg/dL (ref 8.6–10.2)
Chloride: 101 mmol/L (ref 98–110)
Creat: 0.7 mg/dL (ref 0.50–1.10)
GFR, Est African American: >89 mL/min (ref >=60)
GFR, Est Non African American: >89 mL/min (ref >=60)
Glucose, Bld: 187 mg/dL — ABNORMAL HIGH (ref 65–99)
Potassium: 3.9 mmol/L (ref 3.5–5.3)
Sodium: 136 mmol/L (ref 135–146)
Total Bilirubin: 0.5 mg/dL (ref 0.2–1.2)
Total Protein: 7 g/dL (ref 6.1–8.1)

## 2015-12-11 LAB — GLUCOSE, CAPILLARY: Glucose-Capillary: 280 mg/dL — ABNORMAL HIGH (ref 65–99)

## 2015-12-11 MED ORDER — LEVOCETIRIZINE DIHYDROCHLORIDE 5 MG PO TABS: 5.0000 mg | ORAL_TABLET | Freq: Every evening | ORAL | Status: DC

## 2015-12-11 MED FILL — !LEVEMIR 100 UNITS/ML VIAL: 100/ML | 90 days supply | Qty: 10 | Fill #2

## 2015-12-11 MED FILL — LEVOCETIRIZINE 5 MG TABLET: 5 | 30 days supply | Qty: 30 | Fill #0

## 2015-12-11 MED FILL — !NOVOLOG 100UNITS/ML VIAL: 100/ML | 28 days supply | Qty: 10 | Fill #2

## 2015-12-11 NOTE — Progress Notes (Signed)
Subjective:    Patient ID: [name], female    DOB: [birth year], 30 y.o.   MRN: [id number]   [name] is here for follow-up and evaluation of Type 1 diabetes mellitus.  The initial diagnosis of diabetes was made during early teenage years.   Her clinical course has fluctuated. Insulin dosage review with [place] suggested compliance with medication regimen. Associated symptoms of hyperglycemia have been fatigue.   She is currently taking Humalog per sliding scale units and Levemir. [place] is able to give herself injections.  Compliance with blood glucose monitoring has been fair. The patient does perform independently. She states that she rotates sites for injection. She recently started an exercise routine. She says that she is exercising 3-4 times per week. Previous hemoglobin a1C was 10.6. She did not bring glucometer for evaluation.   Patient is also complaining of worsening acne.  Onset was several years ago. Symptoms have gradually worsened. Lesions are described as closed comedones, cysts, open comedones, scars and superficial pustules. Acne is primarily located on the face. The patient also describes no periodicity to symptoms. Treatment to date has included OTC acne medications.    Past Medical History  Diagnosis Date  . Diabetes mellitus without complication ([place])   History reviewed. No pertinent past surgical history.  No Known Allergies   There is no immunization history on file for this patient.  Social History   Social History  . Marital Status: Single    Spouse Name: N/A  . Number of Children: N/A  . Years of Education: N/A   Occupational History  . Not on file.   Social History Main Topics  . Smoking status: Never Smoker   . Smokeless tobacco: Never Used  . Alcohol Use: Yes     Comment: occasionally   . Drug Use: No  . Sexual Activity: Not Currently   Other Topics Concern  . Not on file   Social History Narrative     Review of Systems   Constitutional: Negative for fever and chills.  HENT: Positive for postnasal drip and sore throat.   Eyes: Negative.   Respiratory: Negative.   Gastrointestinal: Negative for nausea, abdominal pain, diarrhea, constipation and anorexia.  Endocrine: Negative.   Genitourinary: Negative.  Negative for dysuria, urgency, frequency, hematuria, flank pain, vaginal discharge, pelvic pain, menorrhagia and missed menses.  Musculoskeletal: Negative.  Negative for back pain and joint pain.  Skin: Negative.  Negative for rash. Acne with scarring and darkening of skin.  Allergic/Immunologic: Environmental allergies Neurological: Negative.  Negative for headaches.  Hematological: Negative.   Psychiatric/Behavioral: Negative.        Objective:   Physical Exam  Constitutional: She is oriented to person, place, and time. She appears well-developed and well-nourished.  HENT:  Head: Normocephalic and atraumatic.  Right Ear: External ear normal.  Left Ear: External ear normal.  Mouth/Throat: Oropharynx is clear and moist.  Eyes: Conjunctivae and EOM are normal. Pupils are equal, round, and reactive to light.  Neck: Normal range of motion. Neck supple.  Cardiovascular: Normal rate, regular rhythm, normal heart sounds and intact distal pulses.   Pulmonary/Chest: Effort normal and breath sounds normal. Right breast exhibits no inverted nipple, no nipple discharge, no skin change and no tenderness. Left breast exhibits no inverted nipple, no nipple discharge, no skin change and no tenderness. Breasts are symmetrical.  Abdominal: Soft. Bowel sounds are normal. Hernia confirmed negative in the right inguinal area and confirmed negative in the left inguinal area.  Neurological: She is alert and oriented to person, place, and time. She has normal reflexes.  Skin: Skin is warm. Open and closed comedones. Hyperpigmented scarring to face. Round, raised, fluid-filled pustules.  Psychiatric: She has a normal mood and  affect. Her behavior is normal. Judgment and thought content normal.     BP 129/80 mmHg  Pulse 110  Temp(Src) 98.1 F (36.7 C) (Oral)  Resp 18  Ht 5\' 6"  (1.676 m)  Wt 157 lb (71.215 kg)  BMI 25.35 kg/m2  SpO2 100%  LMP 11/23/2015 Assessment & Plan:  1. Uncontrolled type 1 diabetes mellitus without complication (HCC) Hemoglobin a1C is 10.0, will increase Levemir to 20 units HS. Reminded patient to take Insulin consistently and bring glucometer to follow-up appointment.  - Hemoglobin A1c - Glucose, capillary  - Urinalysis Dipstick - COMPLETE METABOLIC PANEL WITH GFR - POCT urinalysis dip (device) - insulin detemir (LEVEMIR) 100 unit/ml SOLN; Inject 0.2 mLs (20 Units total) into the skin at bedtime.  Dispense: 30 mL; Refill: 3  2. Post-nasal drip - levocetirizine (XYZAL) 5 MG tablet; Take 1 tablet (5 mg total) by mouth every evening.  Dispense: 30 tablet; Refill: 2  3. Cystic acne - tretinoin (RETIN-A) 0.01 % gel; Apply topically at bedtime.  Dispense: 45 g; Refill: 0 - Benzoyl Peroxide 2.5 % CREA; Apply 1 application topically daily.  Dispense: 1 Tube; Refill: 1 - doxycycline (VIBRAMYCIN) 50 MG capsule; Take 1 capsule (50 mg total) by mouth 2 (two) times daily.  Dispense: 60 capsule; Refill: 3 - Ambulatory referral to Dermatology 4. Environmental allergies - levocetirizine (XYZAL) 5 MG tablet; Take 1 tablet (5 mg total) by mouth every evening.  Dispense: 30 tablet; Refill: 2  5. Glucosuria Will increase Levemir to 20 units at bedtime  6. Superficial acne vulgaris - tretinoin (RETIN-A) 0.01 % gel; Apply topically at bedtime.  Dispense: 45 g; Refill: 0 - Benzoyl Peroxide 2.5 % CREA; Apply 1 application topically daily.  Dispense: 1 Tube; Refill: 1 - doxycycline (VIBRAMYCIN) 50 MG capsule; Take 1 capsule (50 mg total) by mouth 2 (two) times daily.  Dispense: 60 capsule; Refill: 3   RTC: Diabetes mellitus and acne in 2 months   The patient was given clear instructions to go to  ER or return to medical center if symptoms do not improve, worsen or new problems develop. The patient verbalized understanding. Will notify patient with pap smear results    [name], FNP

## 2015-12-12 MED ORDER — INSULIN DETEMIR 100 UNIT/ML FLEXPEN: 20.0000 [IU] | Freq: Every day | SUBCUTANEOUS | Status: DC

## 2015-12-12 MED ORDER — TRETINOIN 0.01 % EX GEL: Freq: Every day | CUTANEOUS | Status: DC

## 2015-12-12 MED ORDER — DOXYCYCLINE HYCLATE 50 MG PO CAPS: 50.0000 mg | ORAL_CAPSULE | Freq: Two times a day (BID) | ORAL | Status: DC

## 2015-12-12 MED ORDER — BENZOYL PEROXIDE 2.5 % EX CREA: 1.0000 "application " | TOPICAL_CREAM | Freq: Every day | CUTANEOUS | Status: DC

## 2015-12-12 MED FILL — TRETINOIN 0.01% GEL: 0.01 % | 15 days supply | Qty: 45 | Fill #0

## 2015-12-12 MED FILL — DOXYCYCLINE MONO 50 MG CAP: 50 | 30 days supply | Qty: 60 | Fill #0

## 2015-12-13 ENCOUNTER — Other Ambulatory Visit: Payer: Self-pay

## 2015-12-13 DIAGNOSIS — L700 Acne vulgaris: Secondary | ICD-10-CM

## 2015-12-13 MED ORDER — BENZOYL PEROXIDE 2.5 % EX CREA: 1.0000 "application " | TOPICAL_CREAM | Freq: Every day | CUTANEOUS | Status: DC

## 2015-12-13 MED FILL — BENZOYL PEROXIDE 2.5% GEL: 2.5 | 30 days supply | Qty: 60 | Fill #0

## 2015-12-13 NOTE — Telephone Encounter (Signed)
Resent in rx to pharmacy. Thanks!

## 2016-01-14 ENCOUNTER — Ambulatory Visit: Payer: No Typology Code available for payment source | Admitting: Family Medicine

## 2016-01-16 ENCOUNTER — Ambulatory Visit: Payer: No Typology Code available for payment source

## 2016-01-17 MED FILL — !NOVOLOG 100UNITS/ML VIAL: 100/ML | 28 days supply | Qty: 10 | Fill #3

## 2016-01-18 ENCOUNTER — Ambulatory Visit: Payer: No Typology Code available for payment source

## 2016-02-21 ENCOUNTER — Ambulatory Visit: Payer: No Typology Code available for payment source | Admitting: Family Medicine

## 2016-02-22 ENCOUNTER — Ambulatory Visit: Payer: No Typology Code available for payment source | Attending: Internal Medicine

## 2016-03-11 ENCOUNTER — Encounter: Payer: Self-pay | Admitting: Family Medicine

## 2016-03-11 ENCOUNTER — Ambulatory Visit (INDEPENDENT_AMBULATORY_CARE_PROVIDER_SITE_OTHER): Payer: No Typology Code available for payment source | Admitting: Family Medicine

## 2016-03-11 VITALS — BP 120/79 | HR 106 | Temp 98.5°F | Resp 16 | Ht 66.0 in | Wt 161.0 lb

## 2016-03-11 DIAGNOSIS — R070 Pain in throat: Secondary | ICD-10-CM

## 2016-03-11 DIAGNOSIS — E118 Type 2 diabetes mellitus with unspecified complications: Secondary | ICD-10-CM

## 2016-03-11 DIAGNOSIS — E138 Other specified diabetes mellitus with unspecified complications: Secondary | ICD-10-CM

## 2016-03-11 LAB — CBC WITH DIFFERENTIAL/PLATELET
Basophils Absolute: 53 cells/uL (ref 0–200)
Basophils Relative: 1 %
Eosinophils Absolute: 106 cells/uL (ref 15–500)
Eosinophils Relative: 2 %
HCT: 38.8 % (ref 35.0–45.0)
Hemoglobin: 12.3 g/dL (ref 11.7–15.5)
Lymphs Abs: 1961 cells/uL (ref 850–3900)
MCH: 27.4 pg (ref 27.0–33.0)
MCHC: 31.7 g/dL — ABNORMAL LOW (ref 32.0–36.0)
MCV: 86.4 fL (ref 80.0–100.0)
MPV: 9.9 fL (ref 7.5–12.5)
Monocytes Absolute: 583 cells/uL (ref 200–950)
Monocytes Relative: 11 %
Neutro Abs: 2597 cells/uL (ref 1500–7800)
Neutrophils Relative %: 49 %
Platelets: 413 10*3/uL — ABNORMAL HIGH (ref 140–400)
RBC: 4.49 MIL/uL (ref 3.80–5.10)
RDW: 14.6 % (ref 11.0–15.0)
WBC: 5.3 10*3/uL (ref 3.8–10.8)

## 2016-03-11 LAB — GLUCOSE, CAPILLARY: Glucose-Capillary: 281 mg/dL — ABNORMAL HIGH (ref 65–99)

## 2016-03-11 LAB — POCT GLYCOSYLATED HEMOGLOBIN (HGB A1C): Hemoglobin A1C: 9

## 2016-03-11 NOTE — Patient Instructions (Addendum)
Increase levemir by one unit each night until fasting AM sugars below 110.  Continue Humalog sliding scale.

## 2016-03-12 LAB — MICROALBUMIN, URINE: Microalb, Ur: 0.5 mg/dL

## 2016-03-12 MED ORDER — ERYTHROMYCIN BASE 250 MG PO TABS: 250.0000 mg | ORAL_TABLET | Freq: Two times a day (BID) | ORAL | 1 refills | Status: DC

## 2016-03-12 MED ORDER — FLUOCIN-HYDROQUINONE-TRETINOIN 0.01-4-0.05 % EX CREA
TOPICAL_CREAM | CUTANEOUS | 1 refills | Status: DC
Start: 2016-03-12 — End: 2016-04-24

## 2016-03-12 MED ORDER — PSYLLIUM 0.52 G PO CAPS: 0.5200 g | ORAL_CAPSULE | Freq: Every day | ORAL | 1 refills | Status: DC

## 2016-03-12 MED ORDER — TRETINOIN 0.1 % EX CREA
TOPICAL_CREAM | Freq: Every day | CUTANEOUS | 1 refills | Status: DC
Start: 2016-03-12 — End: 2016-04-24

## 2016-03-12 MED FILL — TRETINOIN 0.1% CREAM: 0.1 | 20 days supply | Qty: 45 | Fill #0

## 2016-03-19 MED FILL — TRUE METRIX TEST STRIP: 37 days supply | Qty: 150 | Fill #3

## 2016-03-20 MED FILL — !LEVEMIR 100 UNITS/ML VIAL: 100/ML | 10 days supply | Qty: 10 | Fill #0

## 2016-03-20 MED FILL — !NOVOLOG 100UNITS/ML VIAL: 100/ML | 28 days supply | Qty: 10 | Fill #4

## 2016-04-24 ENCOUNTER — Observation Stay (HOSPITAL_COMMUNITY)
Admission: EM | Admit: 2016-04-24 | Discharge: 2016-04-26 | Disposition: A | Discharge status: Home / Self Care | Payer: No Typology Code available for payment source | Attending: Internal Medicine | Admitting: Internal Medicine

## 2016-04-24 ENCOUNTER — Encounter (HOSPITAL_COMMUNITY): Payer: Self-pay | Admitting: Emergency Medicine

## 2016-04-24 DIAGNOSIS — E871 Hypo-osmolality and hyponatremia: Secondary | ICD-10-CM | POA: Insufficient documentation

## 2016-04-24 DIAGNOSIS — Z794 Long term (current) use of insulin: Secondary | ICD-10-CM | POA: Insufficient documentation

## 2016-04-24 DIAGNOSIS — R262 Difficulty in walking, not elsewhere classified: Secondary | ICD-10-CM

## 2016-04-24 DIAGNOSIS — Z833 Family history of diabetes mellitus: Secondary | ICD-10-CM | POA: Insufficient documentation

## 2016-04-24 DIAGNOSIS — E1110 Type 2 diabetes mellitus with ketoacidosis without coma: Secondary | ICD-10-CM | POA: Diagnosis present

## 2016-04-24 DIAGNOSIS — R42 Dizziness and giddiness: Secondary | ICD-10-CM | POA: Insufficient documentation

## 2016-04-24 DIAGNOSIS — E1010 Type 1 diabetes mellitus with ketoacidosis without coma: Principal | ICD-10-CM | POA: Insufficient documentation

## 2016-04-24 LAB — CBC
HCT: 40.5 % (ref 36.0–46.0)
Hemoglobin: 13.2 g/dL (ref 12.0–15.0)
MCH: 27.9 pg (ref 26.0–34.0)
MCHC: 32.6 g/dL (ref 30.0–36.0)
MCV: 85.6 fL (ref 78.0–100.0)
Platelets: 401 10*3/uL — ABNORMAL HIGH (ref 150–400)
RBC: 4.73 MIL/uL (ref 3.87–5.11)
RDW: 13.6 % (ref 11.5–15.5)
WBC: 7.7 10*3/uL (ref 4.0–10.5)

## 2016-04-24 LAB — URINE MICROSCOPIC-ADD ON: RBC / HPF: NONE SEEN RBC/hpf (ref 0–5)

## 2016-04-24 LAB — BASIC METABOLIC PANEL
Anion gap: 17 — ABNORMAL HIGH (ref 5–15)
BUN: 19 mg/dL (ref 6–20)
CO2: 16 mmol/L — ABNORMAL LOW (ref 22–32)
Calcium: 9.5 mg/dL (ref 8.9–10.3)
Chloride: 98 mmol/L — ABNORMAL LOW (ref 101–111)
Creatinine, Ser: 0.92 mg/dL (ref 0.44–1.00)
GFR calc Af Amer: >60 mL/min (ref >60)
GFR calc non Af Amer: >60 mL/min (ref >60)
Glucose, Bld: 413 mg/dL — ABNORMAL HIGH (ref 65–99)
Potassium: 4.8 mmol/L (ref 3.5–5.1)
Sodium: 131 mmol/L — ABNORMAL LOW (ref 135–145)

## 2016-04-24 LAB — URINALYSIS, ROUTINE W REFLEX MICROSCOPIC
Bilirubin Urine: NEGATIVE
Glucose, UA: >1000 mg/dL — AB
Hgb urine dipstick: NEGATIVE
Ketones, ur: >80 mg/dL — AB
Leukocytes, UA: NEGATIVE
Nitrite: NEGATIVE
Protein, ur: NEGATIVE mg/dL
Specific Gravity, Urine: 1.031 — ABNORMAL HIGH (ref 1.005–1.030)
pH: 5 (ref 5.0–8.0)

## 2016-04-24 LAB — POC URINE PREG, ED: Preg Test, Ur: NEGATIVE

## 2016-04-24 LAB — CBG MONITORING, ED
Glucose-Capillary: 374 mg/dL — ABNORMAL HIGH (ref 65–99)
Glucose-Capillary: 225 mg/dL — ABNORMAL HIGH (ref 65–99)

## 2016-04-24 LAB — MAGNESIUM: Magnesium: 1.9 mg/dL (ref 1.7–2.4)

## 2016-04-24 MED ORDER — SODIUM CHLORIDE 0.9 % IV BOLUS (SEPSIS): 1000.0000 mL | Freq: Once | INTRAVENOUS | Status: DC

## 2016-04-24 MED ORDER — SODIUM CHLORIDE 0.9 % IV SOLN
INTRAVENOUS | Status: DC
  Administered 2016-04-24: 3.5 [IU]/h via INTRAVENOUS
  Filled 2016-04-24: qty 2.5

## 2016-04-24 MED ORDER — POTASSIUM CHLORIDE CRYS ER 20 MEQ PO TBCR
40.0000 meq | EXTENDED_RELEASE_TABLET | Freq: Once | ORAL | Status: AC
  Administered 2016-04-24: 40 meq via ORAL
  Filled 2016-04-24: qty 2

## 2016-04-24 MED ORDER — SODIUM CHLORIDE 0.9 % IV BOLUS (SEPSIS)
1000.0000 mL | Freq: Once | INTRAVENOUS | Status: AC
  Administered 2016-04-24: 1000 mL via INTRAVENOUS

## 2016-04-24 MED ORDER — SODIUM CHLORIDE 0.9 % IV SOLN: INTRAVENOUS | Status: DC

## 2016-04-24 MED ORDER — ONDANSETRON HCL 4 MG/2ML IJ SOLN
4.0000 mg | Freq: Once | INTRAMUSCULAR | Status: AC
  Administered 2016-04-24: 4 mg via INTRAVENOUS
  Filled 2016-04-24: qty 2

## 2016-04-24 MED ORDER — DEXTROSE-NACL 5-0.45 % IV SOLN
INTRAVENOUS | Status: DC
  Administered 2016-04-25: 01:00:00 via INTRAVENOUS

## 2016-04-24 NOTE — ED Notes (Signed)
Called unit to give 20 minute and CN stated she will call back.

## 2016-04-24 NOTE — ED Notes (Signed)
Patient states she has episodes of shortness of breath.

## 2016-04-24 NOTE — ED Provider Notes (Signed)
[facility] DEPT Provider Note   CSN: [id number] Arrival date & time: 04/24/16  1519     History   Chief Complaint Chief Complaint  Patient presents with  . Dizziness    HPI AYLA LIPKO is a 30 y.o. female who presents with lightheadedness. PMH significant for DM Type 1 currently on sliding scale insulin with hx of DKA in the past. She states that over the past week she has felt lightheaded like she is going to pass out as well as N/V and polyuria. She started taking Magnesium for constipation which has made her symptoms worse. Reports cough and post-nasal drip which is chronic. Denies fever, chills, syncope, HA, chest pain, SOB,  abdominal pain, diarrhea, dysuria, vaginal discharge. She is currently on her period. She has been taking her sliding scale insulin as her basal insulin. Reports her A1c last time it was checked went from 10 to 9.   HPI  Past Medical History:  Diagnosis Date  . Diabetes mellitus without complication [facility])     Patient Active Problem List   Diagnosis Date Noted  . Rhinitis, allergic 10/11/2015  . Diabetes type 1, uncontrolled ([place]) 07/25/2015  . Cephalalgia 07/25/2015  . Generalized anxiety disorder 07/25/2015  . Depression 07/25/2015  . DKA (diabetic ketoacidoses) ([place]) 07/11/2012  . Leukocytosis 07/11/2012  . Hyperkalemia 07/11/2012  . High anion gap metabolic acidosis [id number]    History reviewed. No pertinent surgical history.  OB History    No data available       Home Medications    Prior to Admission medications   Medication Sig Start Date End Date Taking? Authorizing Provider  insulin aspart (NOVOLOG) 100 UNIT/ML injection Inject 0-20 Units into the skin 3 (three) times daily before meals.   Yes Historical Provider, MD  insulin detemir (LEVEMIR) 100 unit/ml SOLN Inject 0.2 mLs (20 Units total) into the skin at bedtime. 12/12/15  Yes [name], FNP  magnesium gluconate (MAGONATE) 500 MG tablet Take 500 mg by mouth  daily.   Yes Historical Provider, MD  Probiotic Product (PROBIOTIC DAILY PO) Take by mouth.   Yes Historical Provider, MD  Benzoyl Peroxide 2.5 % CREA Apply 1 application topically daily. Patient not taking: Reported on 04/24/2016 12/13/15   [name], FNP  Blood Glucose Monitoring Suppl (TRUE METRIX AIR GLUCOSE METER) DEVI 1 each by Does not apply route 4 (four) times daily -  with meals and at bedtime. 07/25/15   [name], FNP  butalbital-acetaminophen-caffeine (FIORICET) [phone] MG tablet Take 1 tablet by mouth every 6 (six) hours as needed for headache. Patient not taking: Reported on 04/24/2016 07/26/15   [name], FNP  doxycycline (VIBRAMYCIN) 50 MG capsule Take 1 capsule (50 mg total) by mouth 2 (two) times daily. Patient not taking: Reported on 04/24/2016 12/12/15   [name], FNP  erythromycin (E-MYCIN) 250 MG tablet Take 1 tablet (250 mg total) by mouth 2 (two) times daily. Patient not taking: Reported on 04/24/2016 03/12/16   Micheline Chapman, NP  Fluocin-Hydroquinone-Tretinoin 0.01-4-0.05 % CREA Apply to affected areas at bedtime. Patient not taking: Reported on 04/24/2016 03/12/16   Micheline Chapman, NP  glucose blood (TRUE METRIX BLOOD GLUCOSE TEST) test strip Use as instructed 07/25/15   [name], FNP  insulin lispro (HUMALOG) 100 UNIT/ML injection Inject 0-0.1 mLs (0-10 Units total) into the skin 3 (three) times daily before meals. Reported on 07/25/2015 Patient not taking: Reported on 04/24/2016 08/15/15   Olugbemiga E  Doreene Burke, MD  INSULIN SYRINGE .5CC/29G 29G X 1/2" 0.5 ML MISC 1 each by Does not apply route 4 (four) times daily - after meals and at bedtime. 07/25/15   [name], FNP  Lancet Device MISC 1 each by Does not apply route 4 (four) times daily - after meals and at bedtime. 07/25/15   [name], FNP  levocetirizine (XYZAL) 5 MG tablet Take 1 tablet (5 mg total) by mouth every evening. Patient not taking: Reported on 04/24/2016  12/11/15   [name], FNP  psyllium (METAMUCIL) 0.52 g capsule Take 1 capsule (0.52 g total) by mouth daily. Patient not taking: Reported on 04/24/2016 03/12/16   Micheline Chapman, NP  tretinoin (RETIN-A) 0.1 % cream Apply topically at bedtime. Patient not taking: Reported on 04/24/2016 03/12/16   Micheline Chapman, NP    Family History History reviewed. No pertinent family history.  Social History Social History  Substance Use Topics  . Smoking status: Never Smoker  . Smokeless tobacco: Never Used  . Alcohol use Yes     Comment: occasionally      Allergies   Review of patient's allergies indicates no known allergies.   Review of Systems Review of Systems  Constitutional: Negative for chills and fever.  HENT: Positive for postnasal drip.        Chronic  Respiratory: Positive for cough. Negative for shortness of breath.        Chronic  Cardiovascular: Negative for chest pain.  Gastrointestinal: Positive for constipation, nausea and vomiting. Negative for abdominal pain and diarrhea.  Endocrine: Positive for polyuria.  Genitourinary: Positive for flank pain and vaginal bleeding. Negative for dysuria and vaginal discharge.  Neurological: Positive for light-headedness. Negative for syncope and headaches.     Physical Exam Updated Vital Signs BP 132/88 (BP Location: Left Arm)   Pulse 90   Temp 98.6 F (37 C) (Oral)   Resp 20   Ht 5\' 6"  (1.676 m)   Wt 71.2 kg   SpO2 99%   BMI 25.34 kg/m   Physical Exam  Constitutional: She is oriented to person, place, and time. She appears well-developed and well-nourished. No distress.  Appears fatigued  HENT:  Head: Normocephalic and atraumatic.  Eyes: Conjunctivae are normal. Pupils are equal, round, and reactive to light. Right eye exhibits no discharge. Left eye exhibits no discharge. No scleral icterus.  Neck: Normal range of motion. Neck supple.  Cardiovascular: Regular rhythm.  Tachycardia present.  Exam reveals no  gallop and no friction rub.   No murmur heard. Pulmonary/Chest: Effort normal and breath sounds normal. No respiratory distress. She has no wheezes. She has no rales. She exhibits no tenderness.  Abdominal: Soft. Bowel sounds are normal. She exhibits no distension and no mass. There is no tenderness. There is no rebound and no guarding. No hernia.  Musculoskeletal: She exhibits no edema.  Neurological: She is alert and oriented to person, place, and time.  Skin: Skin is warm and dry.  Psychiatric: She has a normal mood and affect. Her behavior is normal.  Nursing note and vitals reviewed.    ED Treatments / Results  Labs (all labs ordered are listed, but only abnormal results are displayed) Labs Reviewed  BASIC METABOLIC PANEL - Abnormal; Notable for the following:       Result Value   Sodium 131 (*)    Chloride 98 (*)    CO2 16 (*)    Glucose, Bld 413 (*)  Anion gap 17 (*)    All other components within normal limits  CBC - Abnormal; Notable for the following:    Platelets 401 (*)    All other components within normal limits  URINALYSIS, ROUTINE W REFLEX MICROSCOPIC (NOT AT [facility]) - Abnormal; Notable for the following:    Specific Gravity, Urine 1.031 (*)    Glucose, UA >1000 (*)    Ketones, ur >80 (*)    All other components within normal limits  URINE MICROSCOPIC-ADD ON - Abnormal; Notable for the following:    Squamous Epithelial / LPF 0-5 (*)    Bacteria, UA RARE (*)    All other components within normal limits  CBG MONITORING, ED - Abnormal; Notable for the following:    Glucose-Capillary 374 (*)    All other components within normal limits  MAGNESIUM  CBG MONITORING, ED  POC URINE PREG, ED    EKG  EKG Interpretation None       Radiology No results found.  Procedures Procedures (including critical care time)  Medications Ordered in ED Medications  insulin regular (NOVOLIN R,HUMULIN R) 250 Units in sodium chloride 0.9 % 250 mL (1 Units/mL) infusion  (3.5 Units/hr Intravenous New Bag/Given 04/24/16 2226)  sodium chloride 0.9 % bolus 1,000 mL (1,000 mLs Intravenous New Bag/Given 04/24/16 2227)    And  0.9 %  sodium chloride infusion (not administered)  dextrose 5 %-0.45 % sodium chloride infusion ( Intravenous Hold 04/24/16 2306)  sodium chloride 0.9 % bolus 1,000 mL (not administered)  ondansetron (ZOFRAN) injection 4 mg (4 mg Intravenous Given 04/24/16 2226)  potassium chloride SA (K-DUR,KLOR-CON) CR tablet 40 mEq (40 mEq Oral Given 04/24/16 2227)     Initial Impression / Assessment and Plan / ED Course  I have reviewed the triage vital signs and the nursing notes.  Pertinent labs & imaging results that were available during my care of the patient were reviewed by me and considered in my medical decision making (see chart for details).  Clinical Course   30 year old female presents with mild DKA. Glucose is 413, Anion gap is 17, >80 ketones in urine. Most likely due to not taking her insulin correctly. She states she is taking her Novolog as sliding scale and basal. Patient is afebrile, not tachypneic, normotensive, and not hypoxic. She is slightly tachycardic and fatigued appearing. CBC overall unremarkable. CMP remarkable for hyponatremia, hypochloremia, low CO2, hyperglycemia. Mag is 1.9. UA remarkable for >1000 glucose with >80 ketones and spec gravity of 1.031. Preg test negative. EKG is NSR.  2L IVF bolus started followed by infusion. Insulin started. Zofran for nausea and K prophylactically given. Spoke with Dr. Hal Hope who will admit patient to stepdown obs.   Final Clinical Impressions(s) / ED Diagnoses   Final diagnoses:  Diabetic ketoacidosis without coma associated with type 1 diabetes mellitus [facility])    New Prescriptions New Prescriptions   No medications on file     Recardo Evangelist, PA-C 04/24/16 2321    Virgel Manifold, MD 04/30/16 [phone]

## 2016-04-24 NOTE — ED Triage Notes (Signed)
Pt states since this past Monday she has felt dizzy and felt like her brain is disconnected from her body, at times does not have control over her limbs; pt states she cannot get her fingers and hands to do what she wants them to do; patient states, she feels like sometimes she is paralyzed.    Patient states she believes this started by her taking Mg 500mg  pills once per day to help with constipation about two weeks ago; about a week after she started taking the Mg pills, she started feeling dizzy and unable to control her body.  Pt states she has been under a lot of stress related to work, transportation, and get her medications.  Patient is diabetic and takes insulin, which she feels makes her symptoms worse.

## 2016-04-25 ENCOUNTER — Encounter (HOSPITAL_COMMUNITY): Payer: Self-pay | Admitting: Internal Medicine

## 2016-04-25 ENCOUNTER — Observation Stay (HOSPITAL_COMMUNITY): Payer: Self-pay

## 2016-04-25 DIAGNOSIS — E1010 Type 1 diabetes mellitus with ketoacidosis without coma: Principal | ICD-10-CM

## 2016-04-25 DIAGNOSIS — R42 Dizziness and giddiness: Secondary | ICD-10-CM

## 2016-04-25 DIAGNOSIS — E1110 Type 2 diabetes mellitus with ketoacidosis without coma: Secondary | ICD-10-CM | POA: Diagnosis present

## 2016-04-25 LAB — CBG MONITORING, ED
Glucose-Capillary: 112 mg/dL — ABNORMAL HIGH (ref 65–99)
Glucose-Capillary: 131 mg/dL — ABNORMAL HIGH (ref 65–99)
Glucose-Capillary: 132 mg/dL — ABNORMAL HIGH (ref 65–99)
Glucose-Capillary: 147 mg/dL — ABNORMAL HIGH (ref 65–99)
Glucose-Capillary: 160 mg/dL — ABNORMAL HIGH (ref 65–99)
Glucose-Capillary: 164 mg/dL — ABNORMAL HIGH (ref 65–99)
Glucose-Capillary: 185 mg/dL — ABNORMAL HIGH (ref 65–99)
Glucose-Capillary: 192 mg/dL — ABNORMAL HIGH (ref 65–99)
Glucose-Capillary: 196 mg/dL — ABNORMAL HIGH (ref 65–99)
Glucose-Capillary: 213 mg/dL — ABNORMAL HIGH (ref 65–99)
Glucose-Capillary: 217 mg/dL — ABNORMAL HIGH (ref 65–99)
Glucose-Capillary: 237 mg/dL — ABNORMAL HIGH (ref 65–99)
Glucose-Capillary: 273 mg/dL — ABNORMAL HIGH (ref 65–99)
Glucose-Capillary: 71 mg/dL (ref 65–99)

## 2016-04-25 LAB — CBC
HCT: 37.6 % (ref 36.0–46.0)
Hemoglobin: 12.4 g/dL (ref 12.0–15.0)
MCH: 27.8 pg (ref 26.0–34.0)
MCHC: 33 g/dL (ref 30.0–36.0)
MCV: 84.3 fL (ref 78.0–100.0)
Platelets: 389 10*3/uL (ref 150–400)
RBC: 4.46 MIL/uL (ref 3.87–5.11)
RDW: 13.5 % (ref 11.5–15.5)
WBC: 7.1 10*3/uL (ref 4.0–10.5)

## 2016-04-25 LAB — RAPID URINE DRUG SCREEN, HOSP PERFORMED
Amphetamines: NOT DETECTED
Amphetamines: NOT DETECTED
Barbiturates: NOT DETECTED
Barbiturates: NOT DETECTED
Benzodiazepines: NOT DETECTED
Benzodiazepines: NOT DETECTED
Cocaine: NOT DETECTED
Cocaine: NOT DETECTED
Opiates: NOT DETECTED
Opiates: NOT DETECTED
Tetrahydrocannabinol: NOT DETECTED
Tetrahydrocannabinol: NOT DETECTED

## 2016-04-25 LAB — BASIC METABOLIC PANEL
Anion gap: 10 (ref 5–15)
Anion gap: 13 (ref 5–15)
Anion gap: 8 (ref 5–15)
BUN: 12 mg/dL (ref 6–20)
BUN: 14 mg/dL (ref 6–20)
BUN: 16 mg/dL (ref 6–20)
CO2: 14 mmol/L — ABNORMAL LOW (ref 22–32)
CO2: 15 mmol/L — ABNORMAL LOW (ref 22–32)
CO2: 19 mmol/L — ABNORMAL LOW (ref 22–32)
Calcium: 8.8 mg/dL — ABNORMAL LOW (ref 8.9–10.3)
Calcium: 9 mg/dL (ref 8.9–10.3)
Calcium: 9.1 mg/dL (ref 8.9–10.3)
Chloride: 106 mmol/L (ref 101–111)
Chloride: 106 mmol/L (ref 101–111)
Chloride: 107 mmol/L (ref 101–111)
Creatinine, Ser: 0.59 mg/dL (ref 0.44–1.00)
Creatinine, Ser: 0.76 mg/dL (ref 0.44–1.00)
Creatinine, Ser: 0.8 mg/dL (ref 0.44–1.00)
GFR calc Af Amer: >60 mL/min (ref >60)
GFR calc Af Amer: >60 mL/min (ref >60)
GFR calc Af Amer: >60 mL/min (ref >60)
GFR calc non Af Amer: >60 mL/min (ref >60)
GFR calc non Af Amer: >60 mL/min (ref >60)
GFR calc non Af Amer: >60 mL/min (ref >60)
Glucose, Bld: 195 mg/dL — ABNORMAL HIGH (ref 65–99)
Glucose, Bld: 296 mg/dL — ABNORMAL HIGH (ref 65–99)
Glucose, Bld: 98 mg/dL (ref 65–99)
Potassium: 3.9 mmol/L (ref 3.5–5.1)
Potassium: 4.5 mmol/L (ref 3.5–5.1)
Potassium: 4.5 mmol/L (ref 3.5–5.1)
Sodium: 131 mmol/L — ABNORMAL LOW (ref 135–145)
Sodium: 133 mmol/L — ABNORMAL LOW (ref 135–145)
Sodium: 134 mmol/L — ABNORMAL LOW (ref 135–145)

## 2016-04-25 LAB — GLUCOSE, CAPILLARY: Glucose-Capillary: 270 mg/dL — ABNORMAL HIGH (ref 65–99)

## 2016-04-25 MED ORDER — DEXTROSE-NACL 5-0.45 % IV SOLN
INTRAVENOUS | Status: DC
  Administered 2016-04-25: 03:00:00 via INTRAVENOUS

## 2016-04-25 MED ORDER — SODIUM CHLORIDE 0.9 % IV SOLN
INTRAVENOUS | Status: DC
  Administered 2016-04-25 (×3): via INTRAVENOUS

## 2016-04-25 MED ORDER — ENOXAPARIN SODIUM 40 MG/0.4ML ~~LOC~~ SOLN
40.0000 mg | SUBCUTANEOUS | Status: DC
  Administered 2016-04-25 – 2016-04-26 (×2): 40 mg via SUBCUTANEOUS
  Filled 2016-04-25 (×2): qty 0.4

## 2016-04-25 MED ORDER — INSULIN DETEMIR 100 UNIT/ML ~~LOC~~ SOLN
20.0000 [IU] | Freq: Every day | SUBCUTANEOUS | Status: DC
  Administered 2016-04-25 – 2016-04-26 (×2): 20 [IU] via SUBCUTANEOUS
  Filled 2016-04-25 (×2): qty 0.2

## 2016-04-25 MED ORDER — SODIUM CHLORIDE 0.9 % IV SOLN
INTRAVENOUS | Status: DC
  Administered 2016-04-25: 0.5 [IU]/h via INTRAVENOUS
  Administered 2016-04-25: 0.7 [IU]/h via INTRAVENOUS
  Filled 2016-04-25: qty 2.5

## 2016-04-25 MED ORDER — POTASSIUM CHLORIDE 10 MEQ/100ML IV SOLN: 10.0000 meq | INTRAVENOUS | Status: DC

## 2016-04-25 MED ORDER — INSULIN ASPART 100 UNIT/ML ~~LOC~~ SOLN
0.0000 [IU] | Freq: Three times a day (TID) | SUBCUTANEOUS | Status: DC
  Administered 2016-04-25 (×2): 3 [IU] via SUBCUTANEOUS
  Administered 2016-04-26: 5 [IU] via SUBCUTANEOUS
  Administered 2016-04-26: 3 [IU] via SUBCUTANEOUS
  Filled 2016-04-25 (×2): qty 1

## 2016-04-25 NOTE — ED Notes (Signed)
Pt reminded again for UA sample. Pt encouraged for UA

## 2016-04-25 NOTE — ED Notes (Signed)
MD MYERS CALLED BACK. ORDERS FOR TELEMETRY BED ASSIGNMENT. CHANGE CBG TO WITH MEALS AND AT HS. CHARGE SUSAN C RN TO CHANGE BED ASSIGNMENT.

## 2016-04-25 NOTE — ED Notes (Signed)
DIABETES COORDINATOR Larene Beach SPOKE WITH MD MEYERS - RE START INSULIN DRIP UNTIL BMET RESULTS ARE BACK. CBG BEFORE DRIP RESTARTED

## 2016-04-25 NOTE — Progress Notes (Signed)
Received patient from ED, telemetry applied, VS obtained, no c/o voiced at this time, oriented to unit, call light in reach

## 2016-04-25 NOTE — ED Notes (Signed)
IV access in right ac removed after infiltration. Ice pack applied and another site obtained. Medications restarted.

## 2016-04-25 NOTE — ED Notes (Signed)
Pt states she forgot to obtain UA sample

## 2016-04-25 NOTE — ED Notes (Signed)
Physical therapy present to evaluate pt for need during admission.

## 2016-04-25 NOTE — ED Notes (Signed)
Dr.Katrakandy notified of CBG and pt maintenance fluids changed back to D5%-0.45%NACL. Swelling to right arm at infiltration site decreased. And pt reports improvement in tightness of arm. Awaiting room assignment for pt at this time.

## 2016-04-25 NOTE — Progress Notes (Addendum)
Inpatient Diabetes Program Recommendations  AACE/ADA: New Consensus Statement on Inpatient Glycemic Control (2015)  Target Ranges:  Prepandial:   less than 140 mg/dL      Peak postprandial:   less than 180 mg/dL (1-2 hours)      Critically ill patients:  140 - 180 mg/dL   Lab Results  Component Value Date   GLUCAP 112 (H) 04/25/2016   HGBA1C 9.0 03/11/2016    Review of Glycemic Control  Diabetes history: DM 1 Outpatient Diabetes medications: Levemir 20 units, Novolog 0-20 units TID Current orders for Inpatient glycemic control: Levemir 20 units  Inpatient Diabetes Program Recommendations:   Patient's CO2 at 0500 was 15, still acidotic. Already received Levemir. Called MD for another BMET.  Please order Novolog Moderate Correction scale + HS scale in addition to the Levemir 20 units Daily.  Patient failed to take insulin when feeling sick.  Thanks,  Tama Headings RN, MSN, [facility] Inpatient Diabetes Coordinator Team Pager [phone] (8a-5p)

## 2016-04-25 NOTE — Progress Notes (Signed)
EDCM spoke to patient at bedside.  Patient confirms her pcp is NP Hollis at [facility].  She confirms she has an appointment  On 06/12/2016 @1330  at [facility].  She reports she obtains her medications at the [facility].  She reports she does not have any difficulty with transportation to her appointments.  No further EDCM needs at this time.

## 2016-04-25 NOTE — ED Notes (Addendum)
CBG 71. ENCOURAGED TO EAT MEAL GIVEN. PT DENIES COMPLAINTS AT THIS TIME.

## 2016-04-25 NOTE — H&P (Signed)
History and Physical    [name] [id number] DOB: May 31, 1986 DOA: 04/24/2016  PCP: [name], FNP  Patient coming from: Home.  Chief Complaint: Dizziness.  HPI: [name] is a 31 y.o. female with diabetes mellitus type 1 presents to the ER because of persistent dizziness. Patient's dizziness present only when she tries to stand up and walk. Denies any headache or visual symptoms. No focal deficits on exam. In the ER patient was found to have elevated blood sugar with anion gap of 17.Since patient has been having nausea along with the dizziness patient had not taken her medications for diabetes last 2 days Patient was started on IV fluids and insulin infusion for diabetic ketoacidosis. CT head was unremarkable. Patient states along with the dizziness she also has some nausea. .   ED Course: Was started on IV insulin and IV fluids for DKA. CT head was unremarkable. EKG was showing normal sinus rhythm.  Review of Systems: As per HPI, rest all negative.   Past Medical History:  Diagnosis Date  . Diabetes mellitus without complication ([place])     History reviewed. No pertinent surgical history.   reports that she has never smoked. She has never used smokeless tobacco. She reports that she drinks alcohol. She reports that she does not use drugs.  No Known Allergies  Family History  Problem Relation Age of Onset  . Throat cancer Mother   . Diabetes Maternal Aunt     Prior to Admission medications   Medication Sig Start Date End Date Taking? Authorizing Provider  insulin aspart (NOVOLOG) 100 UNIT/ML injection Inject 0-20 Units into the skin 3 (three) times daily before meals.   Yes Historical Provider, MD  insulin detemir (LEVEMIR) 100 unit/ml SOLN Inject 0.2 mLs (20 Units total) into the skin at bedtime. 12/12/15  Yes [name], FNP  magnesium gluconate (MAGONATE) 500 MG tablet Take 500 mg by mouth daily.   Yes Historical Provider, MD  Probiotic Product  (PROBIOTIC DAILY PO) Take by mouth.   Yes Historical Provider, MD  Blood Glucose Monitoring Suppl (TRUE METRIX AIR GLUCOSE METER) DEVI 1 each by Does not apply route 4 (four) times daily -  with meals and at bedtime. 07/25/15   [name], FNP  glucose blood (TRUE METRIX BLOOD GLUCOSE TEST) test strip Use as instructed 07/25/15   [name], FNP  INSULIN SYRINGE .5CC/29G 29G X 1/2" 0.5 ML MISC 1 each by Does not apply route 4 (four) times daily - after meals and at bedtime. 07/25/15   [name], FNP  Lancet Device MISC 1 each by Does not apply route 4 (four) times daily - after meals and at bedtime. 07/25/15   [name], FNP    Physical Exam: Vitals:   04/24/16 2307 04/25/16 0027 04/25/16 0100 04/25/16 0200  BP: 111/67 129/85 118/79 128/80  Pulse: 103 102 99 93  Resp: 18 23 (!) 27 17  Temp:      TempSrc:      SpO2: 99% 95% 100% 100%  Weight:      Height:          Constitutional: Moderately built and nourished. Vitals:   04/24/16 2307 04/25/16 0027 04/25/16 0100 04/25/16 0200  BP: 111/67 129/85 118/79 128/80  Pulse: 103 102 99 93  Resp: 18 23 (!) 27 17  Temp:      TempSrc:      SpO2: 99% 95% 100% 100%  Weight:  Height:       Eyes: Anicteric no pallor. ENMT: No discharge from the ears eyes nose or mouth. Neck: No JVD appreciated no mass felt. Respiratory: No rhonchi or crepitations. Cardiovascular: S1 and S2 heard. No murmur appreciated. Abdomen: Soft nontender bowel sounds present. Musculoskeletal: No edema no joint effusions. Skin: No rash skin appears warm. Neurologic: No facial asymmetry tongue is midline moves all extremities 5 x 5 no nystagmus PERRLA positive. Psychiatric: Appears normal. Normal affect. Alert awake oriented to time place and person.   Labs on Admission: I have personally reviewed following labs and imaging studies  CBC:  Recent Labs Lab 04/24/16 1653  WBC 7.7  HGB 13.2  HCT 40.5  MCV 85.6  PLT 401*   Basic  Metabolic Panel:  Recent Labs Lab 04/24/16 1653 04/25/16 0027  NA 131* 133*  K 4.8 4.5  CL 98* 106  CO2 16* 14*  GLUCOSE 413* 195*  BUN 19 16  CREATININE 0.92 0.80  CALCIUM 9.5 9.1  MG 1.9  --    GFR: Estimated Creatinine Clearance: 105 mL/min (by C-G formula based on SCr of 0.8 mg/dL). Liver Function Tests: No results for input(s): AST, ALT, ALKPHOS, BILITOT, PROT, ALBUMIN in the last 168 hours. No results for input(s): LIPASE, AMYLASE in the last 168 hours. No results for input(s): AMMONIA in the last 168 hours. Coagulation Profile: No results for input(s): INR, PROTIME in the last 168 hours. Cardiac Enzymes: No results for input(s): CKTOTAL, CKMB, CKMBINDEX, TROPONINI in the last 168 hours. BNP (last 3 results) No results for input(s): PROBNP in the last 8760 hours. HbA1C: No results for input(s): HGBA1C in the last 72 hours. CBG:  Recent Labs Lab 04/24/16 1556 04/24/16 2328 04/25/16 0029 04/25/16 0130  GLUCAP 374* 225* 185* 160*   Lipid Profile: No results for input(s): CHOL, HDL, LDLCALC, TRIG, CHOLHDL, LDLDIRECT in the last 72 hours. Thyroid Function Tests: No results for input(s): TSH, T4TOTAL, FREET4, T3FREE, THYROIDAB in the last 72 hours. Anemia Panel: No results for input(s): VITAMINB12, FOLATE, FERRITIN, TIBC, IRON, RETICCTPCT in the last 72 hours. Urine analysis:    Component Value Date/Time   COLORURINE YELLOW 04/24/2016 2154   APPEARANCEUR CLEAR 04/24/2016 2154   LABSPEC 1.031 (H) 04/24/2016 2154   PHURINE 5.0 04/24/2016 2154   GLUCOSEU >1000 (A) 04/24/2016 2154   HGBUR NEGATIVE 04/24/2016 2154   BILIRUBINUR NEGATIVE 04/24/2016 2154   KETONESUR >80 (A) 04/24/2016 2154   PROTEINUR NEGATIVE 04/24/2016 2154   UROBILINOGEN 0.2 12/11/2015 1424   NITRITE NEGATIVE 04/24/2016 2154   LEUKOCYTESUR NEGATIVE 04/24/2016 2154   Sepsis Labs: @LABRCNTIP (procalcitonin:4,lacticidven:4) )No results found for this or any previous visit (from the past 240  hour(s)).   Radiological Exams on Admission: No results found.  EKG: Independently reviewed. Normal sinus rhythm with QTC of 437 ms.  Assessment/Plan Principal Problem:   Diabetic ketoacidosis without coma associated with type 1 diabetes mellitus (HCC) Active Problems:   Dizziness    1. Diabetic ketoacidosis - in type I diabetic due to patient not taking her medications last 2 days. Will continue with IV insulin infusion and fluids until anion gap is corrected following which we will start patient's home dose of Levemir. Patient advised not to discontinue her long-acting insulin suddenly. 2. Dizziness - patient's dizziness is more of orthostatic. Will check orthostatic changes physical therapy consult. CT head is unremarkable. May consider MRI brain if dizziness persists. Continue hydration.   DVT prophylaxis: Lovenox. Code Status: Full code.  Family Communication: Discussed  with patient.  Disposition Plan: Home.  Consults called: Physical therapy.  Admission status: Observation.    [name] MD [facility] Pager [phone].  If 7PM-7AM, please contact night-coverage [email or web address] Password [email or web address]  04/25/2016, 2:04 AM

## 2016-04-25 NOTE — ED Notes (Signed)
Spoke with charge nurse from ICU and pt is to be reevaluated by hospitalist to determine if pt needs stepdown status at this time.

## 2016-04-25 NOTE — Evaluation (Signed)
Physical Therapy Evaluation Patient Details Name: [name] MRN: [id number] DOB: Dec 29, 1985 Today's Date: 04/25/2016   History of Present Illness  30 y.o. female with h/o DM1 admitted with dizziness with standing. Pt hadn't taken her diabetes medication in 2 days.  Dx of DKA, hyperglycemia  Clinical Impression  Pt admitted with above diagnosis. Pt currently with functional limitations due to the deficits listed below (see PT Problem List). Pt ambulated 100' with RW and min A. Pt denied dizziness with position changes, orthostatic BP negative (see flowsheets). Pt has some generalized weakness and decreased activity tolerance due to recent illness.  Pt will benefit from skilled PT to increase their independence and safety with mobility to allow discharge to the venue listed below.       Follow Up Recommendations No PT follow up    Equipment Recommendations  Rolling walker with 5" wheels    Recommendations for Other Services       Precautions / Restrictions Precautions Precautions: None Restrictions Weight Bearing Restrictions: No      Mobility  Bed Mobility Overal bed mobility: Needs Assistance Bed Mobility: Supine to Sit     Supine to sit: Min assist     General bed mobility comments: min A to raise trunk; pt denied dizziness with supine to sit and with sit to stand  Transfers Overall transfer level: Needs assistance Equipment used: Rolling walker (2 wheeled) Transfers: Sit to/from Stand Sit to Stand: Min guard         General transfer comment: min/guard for safety due to recent dizziness with standing  Ambulation/Gait Ambulation/Gait assistance: Min assist Ambulation Distance (Feet): 100 Feet Assistive device: Rolling walker (2 wheeled) Gait Pattern/deviations: Decreased step length - right;Decreased step length - left;Trunk flexed Gait velocity: decr Gait velocity interpretation: Below normal speed for age/gender General Gait Details: VCs to correct  flexed trunk posture and to increase step length  Stairs            Wheelchair Mobility    Modified Rankin (Stroke Patients Only)       Balance Overall balance assessment: Needs assistance   Sitting balance-Leahy Scale: Good       Standing balance-Leahy Scale: Fair                               Pertinent Vitals/Pain Pain Assessment: 0-10 Pain Score: 4  Pain Location: low back, chronic Pain Descriptors / Indicators: Sore Pain Intervention(s): Monitored during session;Patient requesting pain meds-RN notified    Home Living Family/patient expects to be discharged to:: Private residence Living Arrangements: Other relatives (livs with grandmother) Available Help at Discharge: Family   Home Access: Stairs to enter   Technical brewer of Steps: 3 Home Layout: One level [place]: None      Prior Function Level of Independence: Independent               Hand Dominance        Extremity/Trunk Assessment   Upper Extremity Assessment: Overall WFL for tasks assessed           Lower Extremity Assessment: Overall WFL for tasks assessed      Cervical / Trunk Assessment: Normal  Communication   Communication: No difficulties  Cognition Arousal/Alertness: Awake/alert Behavior During Therapy: WFL for tasks assessed/performed Overall Cognitive Status: Within Functional Limits for tasks assessed  General Comments      Exercises     Assessment/Plan    PT Assessment Patient needs continued PT services  PT Problem List Decreased activity tolerance;Decreased mobility;Pain;Decreased knowledge of use of DME          PT Treatment Interventions Gait training;Functional mobility training;Therapeutic activities;Therapeutic exercise;Patient/family education;DME instruction    PT Goals (Current goals can be found in the Care Plan section)  Acute Rehab PT Goals Patient Stated Goal: to do artwork PT Goal  Formulation: With patient Time For Goal Achievement: 05/09/16 Potential to Achieve Goals: Good    Frequency Min 3X/week   Barriers to discharge        Co-evaluation               End of Session Equipment Utilized During Treatment: Gait belt Activity Tolerance: Patient tolerated treatment well Patient left: in bed;with call bell/phone within reach Nurse Communication: Mobility status;Patient requests pain meds;Other (comment) (orthostatics negative)    Functional Assessment Tool Used: clinical judgement Functional Limitation: Mobility: Walking and moving around Mobility: Walking and Moving Around Current Status [phone]): At least 1 percent but less than 20 percent impaired, limited or restricted Mobility: Walking and Moving Around Goal Status [phone]): 0 percent impaired, limited or restricted    Time: 0928-0956 PT Time Calculation (min) (ACUTE ONLY): 28 min   Charges:   PT Evaluation $PT Eval Low Complexity: 1 Procedure PT Treatments $Gait Training: 8-22 mins   PT G Codes:   PT G-Codes **NOT FOR INPATIENT CLASS** Functional Assessment Tool Used: clinical judgement Functional Limitation: Mobility: Walking and moving around Mobility: Walking and Moving Around Current Status [id number]): At least 1 percent but less than 20 percent impaired, limited or restricted Mobility: Walking and Moving Around Goal Status [phone]): 0 percent impaired, limited or restricted    Philomena Doheny 04/25/2016, 10:07 AM [phone]

## 2016-04-25 NOTE — ED Notes (Signed)
PT PERFORMED ORTHOSTATICS BEFORE AMBULATION

## 2016-04-25 NOTE — Progress Notes (Signed)
Pt admitted after midnight, please see Dr. Moise Boring admission note. Pt admitted for DKA management. Currently off insulin drip, transition to home regimen insulin. Advance diet, plan d/c in AM.  Faye Ramsay, MD  [facility] Pager [phone]  If 7PM-7AM, please contact night-coverage [email or web address] Password [email or web address]

## 2016-04-25 NOTE — Progress Notes (Signed)
Inpatient Diabetes Program Recommendations  AACE/ADA: New Consensus Statement on Inpatient Glycemic Control (2015)  Target Ranges:  Prepandial:   less than 140 mg/dL      Peak postprandial:   less than 180 mg/dL (1-2 hours)      Critically ill patients:  140 - 180 mg/dL   Lab Results  Component Value Date   GLUCAP 112 (H) 04/25/2016   HGBA1C 9.0 03/11/2016    Due to CO2 on BMET at 0500 being 50, Spoke with Dr. Doyle Askew. Patient to remain on IV insulin until BMET results. Informed Teacher, English as a foreign language.  Thanks,  Tama Headings RN, MSN, [facility] Inpatient Diabetes Coordinator Team Pager [phone] (8a-5p)

## 2016-04-25 NOTE — ED Notes (Signed)
Pt aware CBG with meals and at bedtime

## 2016-04-25 NOTE — ED Notes (Signed)
Dr.Katrakandy at bedside to evaluate pt.

## 2016-04-25 NOTE — ED Notes (Signed)
Pt given UA sample cup, forgot to get UA sample.

## 2016-04-25 NOTE — ED Notes (Signed)
Social work Amy present speaking with pt

## 2016-04-25 NOTE — ED Notes (Signed)
Awaiting medication from pharmacy.

## 2016-04-25 NOTE — ED Notes (Signed)
MD Doyle Askew paged. Per Admission MD previous, once pt off insulin drip pt can go to telemetry. Pt currently assigned to step down. MD Doyle Askew paged to see if pt can be changed

## 2016-04-26 LAB — BASIC METABOLIC PANEL
Anion gap: 7 (ref 5–15)
BUN: 11 mg/dL (ref 6–20)
CO2: 21 mmol/L — ABNORMAL LOW (ref 22–32)
Calcium: 8.4 mg/dL — ABNORMAL LOW (ref 8.9–10.3)
Chloride: 107 mmol/L (ref 101–111)
Creatinine, Ser: 0.52 mg/dL (ref 0.44–1.00)
GFR calc Af Amer: >60 mL/min (ref >60)
GFR calc non Af Amer: >60 mL/min (ref >60)
Glucose, Bld: 262 mg/dL — ABNORMAL HIGH (ref 65–99)
Potassium: 4 mmol/L (ref 3.5–5.1)
Sodium: 135 mmol/L (ref 135–145)

## 2016-04-26 LAB — CBC
HCT: 33.8 % — ABNORMAL LOW (ref 36.0–46.0)
Hemoglobin: 11.5 g/dL — ABNORMAL LOW (ref 12.0–15.0)
MCH: 27.5 pg (ref 26.0–34.0)
MCHC: 34 g/dL (ref 30.0–36.0)
MCV: 80.9 fL (ref 78.0–100.0)
Platelets: 324 10*3/uL (ref 150–400)
RBC: 4.18 MIL/uL (ref 3.87–5.11)
RDW: 13.4 % (ref 11.5–15.5)
WBC: 5 10*3/uL (ref 4.0–10.5)

## 2016-04-26 LAB — GLUCOSE, CAPILLARY
Glucose-Capillary: 224 mg/dL — ABNORMAL HIGH (ref 65–99)
Glucose-Capillary: 267 mg/dL — ABNORMAL HIGH (ref 65–99)
Glucose-Capillary: 235 mg/dL — ABNORMAL HIGH (ref 65–99)

## 2016-04-26 LAB — MRSA PCR SCREENING: MRSA by PCR: NEGATIVE

## 2016-04-26 MED ORDER — POLYETHYLENE GLYCOL 3350 17 G PO PACK
17.0000 g | PACK | Freq: Every day | ORAL | Status: DC
  Administered 2016-04-26: 17 g via ORAL
  Filled 2016-04-26: qty 1

## 2016-04-26 MED ORDER — IBUPROFEN 200 MG PO TABS
400.0000 mg | ORAL_TABLET | Freq: Four times a day (QID) | ORAL | Status: DC | PRN
  Administered 2016-04-26: 400 mg via ORAL
  Filled 2016-04-26: qty 2

## 2016-04-26 NOTE — Discharge Instructions (Signed)
Diabetic Ketoacidosis °Diabetic ketoacidosis is a life-threatening complication of diabetes. If it is not treated, it can cause severe dehydration and organ damage and can lead to a coma or death. °CAUSES °This condition develops when there is not enough of the hormone insulin in the body. Insulin helps the body to break down sugar for energy. Without insulin, the body cannot break down sugar, so it breaks down fats instead. This leads to the production of acids that are called ketones. Ketones are poisonous at high levels. °This condition can be triggered by: °· Stress on the body that is brought on by an illness. °· Medicines that raise blood glucose levels. °· Not taking diabetes medicine. °SYMPTOMS °Symptoms of this condition include: °· Fatigue. °· Weight loss. °· Excessive thirst. °· Light-headedness. °· Fruity or sweet-smelling breath. °· Excessive urination. °· Vision changes. °· Confusion or irritability. °· Nausea. °· Vomiting. °· Rapid breathing. °· Abdominal pain. °· Feeling flushed. °DIAGNOSIS °This condition is diagnosed based on a medical history, a physical exam, and blood tests. You may also have a urine test that checks for ketones. °TREATMENT °This condition may be treated with: °· Fluid replacement. This may be done to correct dehydration. °· Insulin injections. These may be given through the skin or through an IV tube. °· Electrolyte replacement. Electrolytes, such as potassium and sodium, may be given in pill form or through an IV tube. °· Antibiotic medicines. These may be prescribed if your condition was caused by an infection. °HOME CARE INSTRUCTIONS °Eating and Drinking °· Drink enough fluids to keep your urine clear or pale yellow. °· If you cannot eat, alternate between drinking fluids with sugar (such as juice) and salty fluids (such as broth or bouillon). °· If you can eat, follow your usual diet and drink sugar-free liquids, such as water. °Other Instructions °· Take insulin as  directed by your health care provider. Do not skip insulin injections. Do not use expired insulin. °· If your blood sugar is over 240 mg/dL, monitor your urine ketones every 4-6 hours. °· If you were prescribed an antibiotic medicine, finish all of it even if you start to feel better. °· Rest and exercise only as directed by your health care provider. °· If you get sick, call your health care provider and begin treatment quickly. Your body often needs extra insulin to fight an illness. °· Check your blood glucose levels regularly. If your blood glucose is high, drink plenty of fluids. This helps to flush out ketones. °SEEK MEDICAL CARE IF: °· Your blood glucose level is too high or too low. °· You have ketones in your urine. °· You have a fever. °· You cannot eat. °· You cannot tolerate fluids. °· You have been vomiting for more than 2 hours. °· You continue to have symptoms of this condition. °· You develop new symptoms. °SEEK IMMEDIATE MEDICAL CARE IF: °· Your blood glucose levels continue to be high (elevated). °· Your monitor reads "high" even when you are taking insulin. °· You faint. °· You have chest pain. °· You have trouble breathing. °· You have a sudden, severe headache. °· You have sudden weakness in one arm or one leg. °· You have sudden trouble speaking or swallowing. °· You have vomiting or diarrhea that gets worse after 3 hours. °· You feel severely fatigued. °· You have trouble thinking. °· You have abdominal pain. °· You are severely dehydrated. Symptoms of severe dehydration include: °¨ Extreme thirst. °¨ Dry mouth. °¨ Blue lips. °¨   Cold hands and feet. °¨ Rapid breathing. °  °This information is not intended to replace advice given to you by your health care provider. Make sure you discuss any questions you have with your health care provider. °  °Document Released: 07/11/2000 Document Revised: 11/28/2014 Document Reviewed: 06/21/2014 °[employer] Interactive Patient Education ©2016 [employer] ° °

## 2016-04-26 NOTE — Discharge Summary (Signed)
Physician Discharge Summary  [name] [id number] DOB: [birth year] DOA: 04/24/2016  PCP: [name], FNP  Admit date: 04/24/2016 Discharge date: 04/26/2016  Recommendations for Outpatient Follow-up:  1. Pt will need to follow up with PCP in 2-3 weeks post discharge 2. Please obtain BMP to evaluate electrolytes and kidney function  Discharge Diagnoses:  Principal Problem:   Diabetic ketoacidosis without coma associated with type 1 diabetes mellitus ([place]) Active Problems:   Dizziness   DKA (diabetic ketoacidoses) ([name])  Discharge Condition: Stable  Diet recommendation: Heart healthy diet discussed in details   History of present illness:  30 y.o. female with diabetes mellitus type 1 presents to the ER because of persistent dizziness. Patient's dizziness present only when she tries to stand up and walk. Denies any headache or visual symptoms. No focal deficits on exam. In the ER patient was found to have elevated blood sugar with anion gap of 17.   ED Course: Was started on IV insulin and IV fluids for DKA. CT head was unremarkable. EKG was showing normal sinus rhythm.  Hospital Course:  Principal Problem:   Diabetic ketoacidosis without coma associated with type 1 diabetes mellitus ([place]) - has been off insulin drip for over 24 hours, eating and tolerating diet well, wants to go home - OK to resume home regimen - pt reported she has enough insulin at home and does not needs any prescriptions at this time  - say she has follow up appointment with PCP  Active Problems:   Dizziness - from DKA, resolved  - ambulating with no difficulties    Procedures/Studies: Ct Head Wo Contrast  Result Date: 04/25/2016 CLINICAL DATA:  30 year old female with dizziness and hyperglycemia. EXAM: CT HEAD WITHOUT CONTRAST TECHNIQUE: Contiguous axial images were obtained from the base of the skull through the vertex without intravenous contrast. COMPARISON:  Head CT dated 07/09/2015  FINDINGS: Brain: No evidence of acute infarction, hemorrhage, hydrocephalus, extra-axial collection or mass lesion/mass effect. Vascular: No hyperdense vessel or unexpected calcification. Skull: Normal. Negative for fracture or focal lesion. Sinuses/Orbits: No acute finding. Other: None IMPRESSION: No acute intracranial pathology. Electronically Signed   By: [name] M.D.   On: 04/25/2016 02:36    Discharge Exam: Vitals:   04/25/16 1816 04/25/16 2038  BP: 126/90 114/76  Pulse: 87 83  Resp: 16 18  Temp: 98.6 F (37 C) 97.9 F (36.6 C)   Vitals:   04/25/16 1729 04/25/16 1758 04/25/16 1816 04/25/16 2038  BP:  130/94 126/90 114/76  Pulse: 90 95 87 83  Resp:  18 16 18   Temp:  98.7 F (37.1 C) 98.6 F (37 C) 97.9 F (36.6 C)  TempSrc:  Oral Oral Oral  SpO2:  100% 100% 100%  Weight:   71.8 kg (158 lb 4.6 oz)   Height:   5\' 6"  (1.676 m)     General: Pt is alert, follows commands appropriately, not in acute distress Cardiovascular: Regular rate and rhythm, S1/S2 +, no murmurs, no rubs, no gallops Respiratory: Clear to auscultation bilaterally, no wheezing, no crackles, no rhonchi Abdominal: Soft, non tender, non distended, bowel sounds +, no guarding Extremities: no edema, no cyanosis, pulses palpable bilaterally DP and PT Neuro: Grossly nonfocal  Discharge Instructions  Discharge Instructions    Diet - low sodium heart healthy    Complete by:  As directed    Increase activity slowly    Complete by:  As directed        Medication List  STOP taking these medications   Benzoyl Peroxide 2.5 % Crea   butalbital-acetaminophen-caffeine 50-325-40 MG tablet Commonly known as:  FIORICET   doxycycline 50 MG capsule Commonly known as:  VIBRAMYCIN   erythromycin 250 MG tablet Commonly known as:  E-MYCIN   Fluocin-Hydroquinone-Tretinoin 0.01-4-0.05 % Crea   insulin lispro 100 UNIT/ML injection Commonly known as:  HUMALOG   levocetirizine 5 MG tablet Commonly known  as:  XYZAL   psyllium 0.52 g capsule Commonly known as:  METAMUCIL   tretinoin 0.1 % cream Commonly known as:  RETIN-A     TAKE these medications   glucose blood test strip Commonly known as:  TRUE METRIX BLOOD GLUCOSE TEST Use as instructed   insulin aspart 100 UNIT/ML injection Commonly known as:  novoLOG Inject 0-20 Units into the skin 3 (three) times daily before meals.   insulin detemir 100 unit/ml Soln Commonly known as:  LEVEMIR Inject 0.2 mLs (20 Units total) into the skin at bedtime.   INSULIN SYRINGE .5CC/29G 29G X 1/2" 0.5 ML Misc 1 each by Does not apply route 4 (four) times daily - after meals and at bedtime.   Lancet Device Misc 1 each by Does not apply route 4 (four) times daily - after meals and at bedtime.   magnesium gluconate 500 MG tablet Commonly known as:  MAGONATE Take 500 mg by mouth daily.   PROBIOTIC DAILY PO Take by mouth.   TRUE METRIX AIR GLUCOSE METER Devi 1 each by Does not apply route 4 (four) times daily -  with meals and at bedtime.      Follow-up Information    [name], FNP .   Specialty:  Family Medicine Contact information: [address] [place] [address] [phone]            The results of significant diagnostics from this hospitalization (including imaging, microbiology, ancillary and laboratory) are listed below for reference.     Microbiology: Recent Results (from the past 240 hour(s))  MRSA PCR Screening     Status: None   Collection Time: 04/25/16 10:27 PM  Result Value Ref Range Status   MRSA by PCR NEGATIVE NEGATIVE Final    Comment:        The GeneXpert MRSA Assay (FDA approved for NASAL specimens only), is one component of a comprehensive MRSA colonization surveillance program. It is not intended to diagnose MRSA infection nor to guide or monitor treatment for MRSA infections.      Labs: Basic Metabolic Panel:  Recent Labs Lab 04/24/16 1653 04/25/16 0027  04/25/16 0500 04/25/16 0918 04/26/16 0524  NA 131* 133* 131* 134* 135  K 4.8 4.5 4.5 3.9 4.0  CL 98* 106 106 107 107  CO2 16* 14* 15* 19* 21*  GLUCOSE 413* 195* 296* 98 262*  BUN 19 16 14 12 11   CREATININE 0.92 0.80 0.76 0.59 0.52  CALCIUM 9.5 9.1 8.8* 9.0 8.4*  MG 1.9  --   --   --   --    CBC:  Recent Labs Lab 04/24/16 1653 04/25/16 0500 04/26/16 0524  WBC 7.7 7.1 5.0  HGB 13.2 12.4 11.5*  HCT 40.5 37.6 33.8*  MCV 85.6 84.3 80.9  PLT 401* 389 324   CBG:  Recent Labs Lab 04/25/16 1542 04/25/16 1732 04/25/16 2140 04/26/16 0211 04/26/16 0734  GLUCAP 237* 164* 270* 224* 267*     SIGNED: Time coordinating discharge: 30 minutes  MAGICK-MYERS, ISKRA, MD  [facility] 04/26/2016, 11:54 AM  Pager [phone]  If 7PM-7AM, please contact night-coverage [email or web address] Password [email or web address]

## 2016-06-12 ENCOUNTER — Ambulatory Visit: Payer: No Typology Code available for payment source | Admitting: Family Medicine

## 2016-06-27 NOTE — Progress Notes (Signed)
[name], is a 30 y.o. female  [id number]  [id number]  DOB - 11/01/1985  CC:  Chief Complaint  Patient presents with  . Diabetes       HPI: [name] is a 30 y.o. female here for follow-up diabetes. She is on Levemir 20 units daily. Her last A1C in May was 10.0. Today is 9.0. She is not following a low carb diet and does not exercise regularly.She also complains of swelling of the neck.    No Known Allergies Past Medical History:  Diagnosis Date  . Diabetes mellitus without complication [facility])    Current Outpatient Prescriptions on File Prior to Visit  Medication Sig Dispense Refill  . Blood Glucose Monitoring Suppl (TRUE METRIX AIR GLUCOSE METER) DEVI 1 each by Does not apply route 4 (four) times daily -  with meals and at bedtime. 1 Device 0  . glucose blood (TRUE METRIX BLOOD GLUCOSE TEST) test strip Use as instructed 100 each 12  . insulin detemir (LEVEMIR) 100 unit/ml SOLN Inject 0.2 mLs (20 Units total) into the skin at bedtime. 30 mL 3  . INSULIN SYRINGE .5CC/29G 29G X 1/2" 0.5 ML MISC 1 each by Does not apply route 4 (four) times daily - after meals and at bedtime. 200 each 5  . Lancet Device MISC 1 each by Does not apply route 4 (four) times daily - after meals and at bedtime. 1 each 12  . Probiotic Product (PROBIOTIC DAILY PO) Take by mouth.     No current facility-administered medications on file prior to visit.    Family History  Problem Relation Age of Onset  . Throat cancer Mother   . Diabetes Maternal Aunt    Social History   Social History  . Marital status: Single    Spouse name: N/A  . Number of children: N/A  . Years of education: N/A   Occupational History  . Not on file.   Social History Main Topics  . Smoking status: Never Smoker  . Smokeless tobacco: Never Used  . Alcohol use Yes     Comment: occasionally   . Drug use: No  . Sexual activity: Not Currently   Other Topics Concern  . Not on file   Social History  Narrative  . No narrative on file    Review of Systems: Constitutional: Negative Skin: Negative HENT: Negative  Eyes: Negative  Neck: Negative Respiratory: Negative Cardiovascular: Negative Gastrointestinal: Negative Genitourinary: Negative  Musculoskeletal: Negative   Neurological: Negative for Hematological: Negative  Psychiatric/Behavioral: Negative    Objective:   Vitals:   03/11/16 1351  BP: 120/79  Pulse: (!) 106  Resp: 16  Temp: 98.5 F (36.9 C)    Physical Exam: Constitutional: Patient appears well-developed and well-nourished. No distress. HENT: Normocephalic, atraumatic, External right and left ear normal. Oropharynx is clear and moist.  Eyes: Conjunctivae and EOM are normal. PERRLA, no scleral icterus. Neck: Normal ROM. Neck supple. No lymphadenopathy, No thyromegaly. CVS: RRR, S1/S2 +, no murmurs, no gallops, no rubs Pulmonary: Effort and breath sounds normal, no stridor, rhonchi, wheezes, rales.  Abdominal: Soft. Normoactive BS,, no distension, tenderness, rebound or guarding.  Musculoskeletal: Normal range of motion. No edema and no tenderness.  Neuro: Alert.Normal muscle tone coordination. Non-focal Skin: Skin is warm and dry. No rash noted. Not diaphoretic. No erythema. No pallor. Psychiatric: Normal mood and affect. Behavior, judgment, thought content normal.  Lab Results  Component Value Date   WBC 5.0 04/26/2016   HGB 11.5 (L)  04/26/2016   HCT 33.8 (L) 04/26/2016   MCV 80.9 04/26/2016   PLT 324 04/26/2016   Lab Results  Component Value Date   CREATININE 0.52 04/26/2016   BUN 11 04/26/2016   NA 135 04/26/2016   K 4.0 04/26/2016   CL 107 04/26/2016   CO2 21 (L) 04/26/2016    Lab Results  Component Value Date   HGBA1C 9.0 03/11/2016   Lipid Panel     Component Value Date/Time   CHOL  12/23/2009 2225    195        ATP III CLASSIFICATION:  <200     mg/dL   Desirable  200-239  mg/dL   Borderline High  >=240    mg/dL   High           TRIG 108 12/23/2009 2225   HDL 79 12/23/2009 2225   CHOLHDL 2.5 12/23/2009 2225   VLDL 22 12/23/2009 2225   LDLCALC  12/23/2009 2225    94        Total Cholesterol/HDL:CHD Risk Coronary Heart Disease Risk Table                     Men   Women  1/2 Average Risk   3.4   3.3  Average Risk       5.0   4.4  2 X Average Risk   9.6   7.1  3 X Average Risk  23.4   11.0        Use the calculated Patient Ratio above and the CHD Risk Table to determine the patient's CHD Risk.        ATP III CLASSIFICATION (LDL):  <100     mg/dL   Optimal  100-129  mg/dL   Near or Above                    Optimal  130-159  mg/dL   Borderline  160-189  mg/dL   High  >190     mg/dL   Very High        Assessment and plan:   1. Diabetes mellitus of other type with complication ([place])  - POCT glycosylated hemoglobin (Hb A1C) - CBC w/Diff - Microalbumin, urine - Glucose (CBG)  2. Throat pain  - DG Neck Soft Tissue; Future - DG Neck Soft Tissue   Follow-up in 3 months. The patient was given clear instructions to go to ER or return to medical center if symptoms don't improve, worsen or new problems develop. The patient verbalized understanding.    Micheline Chapman FNP  06/27/2016, 3:22 PM

## 2016-07-11 ENCOUNTER — Ambulatory Visit: Payer: No Typology Code available for payment source | Admitting: Family Medicine

## 2016-08-04 ENCOUNTER — Ambulatory Visit: Payer: No Typology Code available for payment source | Attending: Internal Medicine

## 2016-08-07 ENCOUNTER — Ambulatory Visit (INDEPENDENT_AMBULATORY_CARE_PROVIDER_SITE_OTHER): Payer: Self-pay | Admitting: Family Medicine

## 2016-08-07 ENCOUNTER — Encounter: Payer: Self-pay | Admitting: Family Medicine

## 2016-08-07 VITALS — BP 135/95 | HR 102 | Temp 98.3°F | Resp 16 | Ht 66.0 in | Wt 168.0 lb

## 2016-08-07 DIAGNOSIS — Z23 Encounter for immunization: Secondary | ICD-10-CM

## 2016-08-07 DIAGNOSIS — L700 Acne vulgaris: Secondary | ICD-10-CM

## 2016-08-07 DIAGNOSIS — E1065 Type 1 diabetes mellitus with hyperglycemia: Secondary | ICD-10-CM

## 2016-08-07 DIAGNOSIS — Z114 Encounter for screening for human immunodeficiency virus [HIV]: Secondary | ICD-10-CM

## 2016-08-07 DIAGNOSIS — L810 Postinflammatory hyperpigmentation: Secondary | ICD-10-CM

## 2016-08-07 DIAGNOSIS — IMO0001 Reserved for inherently not codable concepts without codable children: Secondary | ICD-10-CM

## 2016-08-07 LAB — COMPLETE METABOLIC PANEL WITH GFR
ALT: 12 U/L (ref 6–29)
AST: 16 U/L (ref 10–30)
Albumin: 3.9 g/dL (ref 3.6–5.1)
Alkaline Phosphatase: 49 U/L (ref 33–115)
BUN: 9 mg/dL (ref 7–25)
CO2: 22 mmol/L (ref 20–31)
Calcium: 9.1 mg/dL (ref 8.6–10.2)
Chloride: 103 mmol/L (ref 98–110)
Creat: 0.66 mg/dL (ref 0.50–1.10)
GFR, Est African American: >89 mL/min (ref >=60)
GFR, Est Non African American: >89 mL/min (ref >=60)
Glucose, Bld: 136 mg/dL — ABNORMAL HIGH (ref 65–99)
Potassium: 4.2 mmol/L (ref 3.5–5.3)
Sodium: 136 mmol/L (ref 135–146)
Total Bilirubin: 0.8 mg/dL (ref 0.2–1.2)
Total Protein: 6.9 g/dL (ref 6.1–8.1)

## 2016-08-07 LAB — POCT URINALYSIS DIP (DEVICE)
Bilirubin Urine: NEGATIVE
Glucose, UA: NEGATIVE mg/dL
Hgb urine dipstick: NEGATIVE
Ketones, ur: NEGATIVE mg/dL
Leukocytes, UA: NEGATIVE
Nitrite: NEGATIVE
Protein, ur: NEGATIVE mg/dL
Specific Gravity, Urine: 1.015 (ref 1.005–1.030)
Urobilinogen, UA: 0.2 mg/dL (ref 0.0–1.0)
pH: 7.5 (ref 5.0–8.0)

## 2016-08-07 LAB — POCT GLYCOSYLATED HEMOGLOBIN (HGB A1C): Hemoglobin A1C: 7.7

## 2016-08-07 LAB — GLUCOSE, CAPILLARY: Glucose-Capillary: 127 mg/dL — ABNORMAL HIGH (ref 65–99)

## 2016-08-07 MED ORDER — INSULIN DETEMIR 100 UNIT/ML FLEXPEN: 20.0000 [IU] | Freq: Every day | SUBCUTANEOUS | 3 refills | Status: DC

## 2016-08-07 MED ORDER — INSULIN ASPART 100 UNIT/ML ~~LOC~~ SOLN: 0.0000 [IU] | Freq: Three times a day (TID) | SUBCUTANEOUS | 11 refills | Status: DC

## 2016-08-07 MED ORDER — HYDROQUINONE 4 % EX CREA: TOPICAL_CREAM | Freq: Two times a day (BID) | CUTANEOUS | 0 refills | Status: DC

## 2016-08-07 NOTE — Patient Instructions (Addendum)
Type 1 Diabetes Mellitus, Diagnosis, Adult Type 1 diabetes (type 1 diabetes mellitus) is a long-term (chronic) disease. It happens when your body does not make enough of a hormone called insulin. Insulin lets sugars (glucose) go into cells in the body. This gives you energy. If your body does not make enough insulin, sugars cannot get into cells. This causes high blood sugar (hyperglycemia). Your doctor will set treatment goals for you. Generally, you should have these blood sugar levels:  Before meals (preprandial): 80-130 mg/dL (4.4-7.2 mmol/L).  After meals (postprandial): below 180 mg/dL (10 mmol/L).  A1c (hemoglobin A1c) level: less than 7%. Follow these instructions at home: Questions to Ask Your Doctor  You may want to ask these questions:  Do I need to meet with a diabetes educator?  Where can I find a support group for people with diabetes?  What equipment will I need to care for myself at home?  What diabetes medicines do I need? When should I take them?  How often do I need to check my blood sugar?  What number can I call if I have questions?  When is my next doctor's visit? General instructions  Take over-the-counter and prescription medicines only as told by your doctor.  Keep all follow-up visits as told by your doctor. This is important. Contact a doctor if:  Your blood sugar is at or above 240 mg/dL (13.3 mmol/L) for 2 days in a row.  You have been sick or have had a fever for 2 days or more, and you are not getting better.  You have any of these problems for more than 6 hours:  You cannot eat or drink.  You feel sick to your stomach (nauseous).  You throw up (vomit).  You have watery poop (diarrhea). Get help right away if:  Your blood sugar is lower than 54 mg/dL (3 mmol/L).  You get confused.  You have trouble:  Thinking clearly.  Breathing.  You have moderate or large ketone levels in your pee (urine). This information is not intended to  replace advice given to you by your health care provider. Make sure you discuss any questions you have with your health care provider. Document Released: 11/05/2015 Document Revised: 12/20/2015 Document Reviewed: 08/17/2015 Elsevier Interactive Patient Education  2017 [employer] [place].  Acne Acne is a skin problem that causes pimples. Acne occurs when the pores in the skin get blocked. The pores may become infected with bacteria, or they may become red, sore, and swollen. Acne is a common skin problem, especially for teenagers. Acne usually goes away over time. What are the causes? Each pore contains an oil gland. Oil glands make an oily substance that is called sebum. Acne happens when these glands get plugged with sebum, dead skin cells, and dirt. Then, the bacteria that are normally found in the oil glands multiply and cause inflammation. Acne is commonly triggered by changes in your hormones. These hormonal changes can cause the oil glands to get bigger and to make more sebum. Factors that can make acne worse include:  Hormone changes during:  Adolescence.  Women's menstrual cycles.  Pregnancy.  Oil-based cosmetics and hair products.  Harshly scrubbing the skin.  Strong soaps.  Stress.  Hormone problems that are due to certain diseases.  Long or oily hair rubbing against the skin.  Certain medicines.  Pressure from headbands, backpacks, or shoulder pads.  Exposure to certain oils and chemicals. What increases the risk? This condition is more likely to develop in:  Teenagers.  People who have a family history of acne. What are the signs or symptoms? Acne often occurs on the face, neck, chest, and upper back. Symptoms include:  Small, red bumps (pimples or papules).  Whiteheads.  Blackheads.  Small, pus-filled pimples (pustules).  Big, red pimples or pustules that feel tender. More severe acne can cause:  An infected area that contains a collection of pus  (abscess).  Hard, painful, fluid-filled sacs (cysts).  Scars. How is this diagnosed? This condition is diagnosed with a medical history and physical exam. Blood tests may also be done. How is this treated? Treatment for this condition can vary depending on the severity of your acne. Treatment may include:  Creams and lotions that prevent oil glands from clogging.  Creams and lotions that treat or prevent infections and inflammation.  Antibiotic medicines that are applied to the skin or taken as a pill.  Pills that decrease sebum production.  Birth control pills.  Light or laser treatments.  Surgery.  Injections of medicine into the affected areas.  Chemicals that cause peeling of the skin. Your health care provider will also recommend the best way to take care of your skin. Good skin care is the most important part of treatment. Follow these instructions at home: Skin care Take care of your skin as told by your health care provider. You may be told to do these things:  Wash your skin gently at least two times each day, as well as:  After you exercise.  Before you go to bed.  Use mild soap.  Apply a water-based skin moisturizer after you wash your skin.  Use a sunscreen or sunblock with SPF 30 or greater. This is especially important if you are using acne medicines.  Choose cosmetics that will not plug your oil glands (are noncomedogenic). Medicines  Take over-the-counter and prescription medicines only as told by your health care provider.  If you were prescribed an antibiotic medicine, apply or take it as told by your health care provider. Do not stop taking the antibiotic even if your condition improves. General instructions  Keep your hair clean and off of your face. If you have oily hair, shampoo your hair regularly or daily.  Avoid leaning your chin or forehead against your hands.  Avoid wearing tight headbands or hats.  Avoid picking or squeezing your  pimples. That can make your acne worse and cause scarring.  Keep all follow-up visits as told by your health care provider. This is important.  Shave gently and only when necessary.  Keep a food journal to figure out if any foods are linked with your acne. Contact a health care provider if:  Your acne is not better after eight weeks.  Your acne gets worse.  You have a large area of skin that is red or tender.  You think that you are having side effects from any acne medicine. This information is not intended to replace advice given to you by your health care provider. Make sure you discuss any questions you have with your health care provider. Document Released: 07/11/2000 Document Revised: 03/14/2016 Document Reviewed: 09/20/2014 Elsevier Interactive Patient Education  2017 [employer].

## 2016-08-07 NOTE — Progress Notes (Signed)
Subjective:    Patient ID: [name], female    DOB: 17-Sep-1985, 31 y.o.   MRN: [id number]  Diabetes  Pertinent negatives for hypoglycemia include no dizziness or tremors. Pertinent negatives for diabetes include no chest pain, no fatigue, no polydipsia, no polyphagia, no polyuria and no weakness.   [name], a 31 year old female with a history of type I diabetes presents for a 3 month follow up of diabetes type 1 and GAD. Her initial diagnosis of type I diabetes was made at age 71. Her clinical course has fluctuated due to non adherance to medication regimen.  She maintains that she has been using carbohydrate ration with meal coverage, and she has been using Levemir consistently. She is knowledgable of basic carbohydrate counting; however, patient has been changing medication regimen without consulting provider. She states that her typical symptom of hypoglycemia is mouth tingling, jitteriness and sweating.    Past Medical History:  Diagnosis Date  . Diabetes mellitus without complication ([place])    There is no immunization history on file for this patient.  No Known Allergies  Social History   Social History  . Marital status: Single    Spouse name: N/A  . Number of children: N/A  . Years of education: N/A   Occupational History  . Not on file.   Social History Main Topics  . Smoking status: Never Smoker  . Smokeless tobacco: Never Used  . Alcohol use Yes     Comment: occasionally   . Drug use: No  . Sexual activity: Not Currently   Other Topics Concern  . Not on file   Social History Narrative  . No narrative on file   Review of Systems  Constitutional: Negative for fatigue.  Eyes: Negative for visual disturbance.  Respiratory: Negative.  Negative for shortness of breath.   Cardiovascular: Negative.  Negative for chest pain, palpitations and leg swelling.  Gastrointestinal: Negative.   Endocrine: Negative for cold intolerance, heat intolerance,  polydipsia, polyphagia and polyuria.  Genitourinary: Negative for pelvic pain, urgency, vaginal discharge and vaginal pain.  Neurological: Negative for dizziness, tremors, facial asymmetry, weakness and numbness.  Hematological: Negative.   Psychiatric/Behavioral: Negative for sleep disturbance and suicidal ideas.      Objective:   Physical Exam  Constitutional: She is oriented to person, place, and time. She appears well-developed and well-nourished.  HENT:  Head: Normocephalic and atraumatic.  Right Ear: External ear normal.  Left Ear: External ear normal.  Nose: Nose normal.  Mouth/Throat: Oropharynx is clear and moist.  Eyes: Conjunctivae and EOM are normal. Pupils are equal, round, and reactive to light.  Neck: Trachea normal and normal range of motion. Neck supple. No thyroid mass present.  Cardiovascular: Normal rate, regular rhythm, normal heart sounds and intact distal pulses.   Pulmonary/Chest: Effort normal and breath sounds normal.  Abdominal: Soft. Bowel sounds are normal.  Musculoskeletal: Normal range of motion.  Neurological: She is alert and oriented to person, place, and time. She has normal reflexes. She is not disoriented. She displays no atrophy. No sensory deficit. She exhibits normal muscle tone. Gait normal.  Skin: Skin is warm and dry. Lesion (cystic acne to face with hyperpigmentation) noted.  Psychiatric: Her behavior is normal. Thought content normal.     BP (!) 135/95 (BP Location: Right Arm, Patient Position: Sitting, Cuff Size: Normal)   Pulse (!) 102   Temp 98.3 F (36.8 C) (Oral)   Resp 16   Ht 5\' 6"  (  1.676 m)   Wt 168 lb (76.2 kg)   LMP 07/17/2016   SpO2 100%   BMI 27.12 kg/m  Assessment & Plan:  1. Uncontrolled type 1 diabetes mellitus without complication [facility]) Ms. Calitri has been taking medications consistently. Hemoglobin a1C decreased to 7.7.  She and I discussed medications at length.  She will continue Novolog for meal coverage. We  discussed calculating insulin carbohydrate ratio. Also, she will only take Levemir 20 units HS. I have discussed with her the great importance of following the treatment plan exactly as directed in order to achieve a good medical outcome.  - HgB A1c - COMPLETE METABOLIC PANEL WITH GFR - insulin aspart (NOVOLOG) 100 UNIT/ML injection; Inject 0-20 Units into the skin 3 (three) times daily before meals.  Dispense: 10 mL; Refill: 11 - insulin detemir (LEVEMIR) 100 unit/ml SOLN; Inject 0.2 mLs (20 Units total) into the skin at bedtime.  Dispense: 30 mL; Refill: 3  2. Cystic acne Patient has post inflammatory hyperpigmentation; will start a trial of hydroquinone.  - hydroquinone 4 % cream; Apply topically 2 (two) times daily.  Dispense: 28.35 g; Refill: 0  3. Immunization due  - Pneumococcal polysaccharide vaccine 23-valent greater than or equal to 2yo subcutaneous/IM  4. Screening for HIV (human immunodeficiency virus)  - HIV antibody (with reflex)  5. Hyperpigmentation of skin, postinflammatory - hydroquinone 4 % cream; Apply topically 2 (two) times daily.  Dispense: 28.35 g; Refill: 0    RTC: 3 months for DMII Karelly Dewalt M, FNP

## 2016-08-08 LAB — HIV ANTIBODY (ROUTINE TESTING W REFLEX): HIV 1&2 Ab, 4th Generation: NONREACTIVE

## 2016-09-30 ENCOUNTER — Telehealth: Payer: Self-pay

## 2016-09-30 DIAGNOSIS — L700 Acne vulgaris: Secondary | ICD-10-CM

## 2016-09-30 MED ORDER — HYDROQUINONE 4 % EX CREA: TOPICAL_CREAM | Freq: Two times a day (BID) | CUTANEOUS | 1 refills | Status: DC

## 2016-09-30 NOTE — Telephone Encounter (Signed)
[place], Please advise. Thanks!  

## 2016-09-30 NOTE — Telephone Encounter (Signed)
Meds ordered this encounter  Medications  . hydroquinone 4 % cream    Sig: Apply topically 2 (two) times daily.    Dispense:  56.8 g    Refill:  1

## 2016-11-06 ENCOUNTER — Ambulatory Visit: Payer: No Typology Code available for payment source | Admitting: Family Medicine

## 2016-12-26 ENCOUNTER — Ambulatory Visit: Payer: No Typology Code available for payment source

## 2017-01-02 ENCOUNTER — Ambulatory Visit: Payer: No Typology Code available for payment source

## 2017-04-23 ENCOUNTER — Emergency Department (HOSPITAL_COMMUNITY)
Admission: EM | Admit: 2017-04-23 | Discharge: 2017-04-23 | Disposition: A | Discharge status: Home / Self Care | Payer: No Typology Code available for payment source | Attending: Emergency Medicine | Admitting: Emergency Medicine

## 2017-04-23 ENCOUNTER — Encounter (HOSPITAL_COMMUNITY): Payer: Self-pay

## 2017-04-23 DIAGNOSIS — Z794 Long term (current) use of insulin: Secondary | ICD-10-CM | POA: Insufficient documentation

## 2017-04-23 DIAGNOSIS — R5383 Other fatigue: Secondary | ICD-10-CM | POA: Insufficient documentation

## 2017-04-23 DIAGNOSIS — Z79899 Other long term (current) drug therapy: Secondary | ICD-10-CM | POA: Insufficient documentation

## 2017-04-23 DIAGNOSIS — E1065 Type 1 diabetes mellitus with hyperglycemia: Secondary | ICD-10-CM | POA: Insufficient documentation

## 2017-04-23 DIAGNOSIS — R739 Hyperglycemia, unspecified: Secondary | ICD-10-CM

## 2017-04-23 LAB — CBC
HCT: 34.9 % — ABNORMAL LOW (ref 36.0–46.0)
Hemoglobin: 11.4 g/dL — ABNORMAL LOW (ref 12.0–15.0)
MCH: 27.2 pg (ref 26.0–34.0)
MCHC: 32.7 g/dL (ref 30.0–36.0)
MCV: 83.3 fL (ref 78.0–100.0)
Platelets: 312 10*3/uL (ref 150–400)
RBC: 4.19 MIL/uL (ref 3.87–5.11)
RDW: 13.9 % (ref 11.5–15.5)
WBC: 4.3 10*3/uL (ref 4.0–10.5)

## 2017-04-23 LAB — URINALYSIS, ROUTINE W REFLEX MICROSCOPIC
Bacteria, UA: NONE SEEN
Bilirubin Urine: NEGATIVE
Glucose, UA: >=500 mg/dL — AB
Ketones, ur: 20 mg/dL — AB
Leukocytes, UA: NEGATIVE
Nitrite: NEGATIVE
Protein, ur: NEGATIVE mg/dL
Specific Gravity, Urine: 1.024 (ref 1.005–1.030)
Squamous Epithelial / LPF: NONE SEEN
pH: 5 (ref 5.0–8.0)

## 2017-04-23 LAB — CBG MONITORING, ED: Glucose-Capillary: 166 mg/dL — ABNORMAL HIGH (ref 65–99)

## 2017-04-23 LAB — BASIC METABOLIC PANEL
Anion gap: 9 (ref 5–15)
BUN: 11 mg/dL (ref 6–20)
CO2: 23 mmol/L (ref 22–32)
Calcium: 8.7 mg/dL — ABNORMAL LOW (ref 8.9–10.3)
Chloride: 103 mmol/L (ref 101–111)
Creatinine, Ser: 0.59 mg/dL (ref 0.44–1.00)
GFR calc Af Amer: >60 mL/min (ref >60)
GFR calc non Af Amer: >60 mL/min (ref >60)
Glucose, Bld: 163 mg/dL — ABNORMAL HIGH (ref 65–99)
Potassium: 3.9 mmol/L (ref 3.5–5.1)
Sodium: 135 mmol/L (ref 135–145)

## 2017-04-23 NOTE — ED Provider Notes (Signed)
[facility] DEPT Provider Note   CSN: [id number] Arrival date & time: 04/23/17  0908     History   Chief Complaint Chief Complaint  Patient presents with  . Hyperglycemia    HPI [name] is a 31 y.o. female.  HPI   31 year old female presents today with complaints of hyperglycemia.  Patient notes over the last 3 weeks she has had increased urination, and generalized fatigue.  Patient notes that she feels like the early signs of DKA.  She notes she has been drinking a gallon of water per day to help offset this.  She notes that her blood sugars have been slightly elevated from the low 100s to the 300s despite using her insulin.  She notes this is dependent on whether she is exercising or not.  Patient notes generalized body aches and fatigue, she notes fatigue with daily activities.  She denies any acute chest pain or shortness of breath, denies any swelling or edema, fever, cough, nausea or vomiting.  Patient denies any dysuria.  She denies any exposure to close sick contacts.  No insect or tick bites.  Patient that she has had some nasal congestion and has been taking loratadine daily for this.  No other medications other than her insulin.   Past Medical History:  Diagnosis Date  . Diabetes mellitus without complication [facility])     Patient Active Problem List   Diagnosis Date Noted  . Hyperpigmentation of skin, postinflammatory 08/07/2016  . Dizziness 04/25/2016  . DKA (diabetic ketoacidoses) ([place]) 04/25/2016  . Rhinitis, allergic 10/11/2015  . Diabetes type 1, uncontrolled ([place]) 07/25/2015  . Cephalalgia 07/25/2015  . Generalized anxiety disorder 07/25/2015  . Depression 07/25/2015  . Diabetic ketoacidosis without coma associated with type 1 diabetes mellitus ([place]) 07/11/2012  . Leukocytosis 07/11/2012  . Hyperkalemia 07/11/2012  . High anion gap metabolic acidosis 96/22/2979    History reviewed. No pertinent surgical history.  OB History    No data available        Home Medications    Prior to Admission medications   Medication Sig Start Date End Date Taking? Authorizing Provider  Blood Glucose Monitoring Suppl (TRUE METRIX AIR GLUCOSE METER) DEVI 1 each by Does not apply route 4 (four) times daily -  with meals and at bedtime. 07/25/15   [name], FNP  glucose blood (TRUE METRIX BLOOD GLUCOSE TEST) test strip Use as instructed 07/25/15   [name], FNP  hydroquinone 4 % cream Apply topically 2 (two) times daily. 09/30/16   [name], FNP  insulin aspart (NOVOLOG) 100 UNIT/ML injection Inject 0-20 Units into the skin 3 (three) times daily before meals. 08/07/16   [name], FNP  insulin detemir (LEVEMIR) 100 unit/ml SOLN Inject 0.2 mLs (20 Units total) into the skin at bedtime. 08/07/16   [name], FNP  INSULIN SYRINGE .5CC/29G 29G X 1/2" 0.5 ML MISC 1 each by Does not apply route 4 (four) times daily - after meals and at bedtime. 07/25/15   [name], FNP  Lancet Device MISC 1 each by Does not apply route 4 (four) times daily - after meals and at bedtime. 07/25/15   [name], FNP  magnesium gluconate (MAGONATE) 500 MG tablet Take 500 mg by mouth daily.    [provider]  Probiotic Product (PROBIOTIC DAILY PO) Take by mouth.    [provider]    Family History Family History  Problem Relation Age of Onset  .  Throat cancer Mother   . Diabetes Maternal Aunt     Social History Social History  Substance Use Topics  . Smoking status: Never Smoker  . Smokeless tobacco: Never Used  . Alcohol use Yes     Comment: occasionally      Allergies   Patient has no known allergies.   Review of Systems Review of Systems  All other systems reviewed and are negative.    Physical Exam Updated Vital Signs BP 121/71   Pulse 88   Temp 98 F (36.7 C)   Resp 15   Wt 76.2 kg (168 lb)   SpO2 99%   BMI 27.12 kg/m   Physical Exam  Constitutional: She is oriented to  person, place, and time. She appears well-developed and well-nourished.  HENT:  Head: Normocephalic and atraumatic.  Eyes: Pupils are equal, round, and reactive to light. Conjunctivae are normal. Right eye exhibits no discharge. Left eye exhibits no discharge. No scleral icterus.  Neck: Normal range of motion. No JVD present. No tracheal deviation present.  Cardiovascular: Normal rate, regular rhythm, normal heart sounds and intact distal pulses.  Exam reveals no gallop and no friction rub.   No murmur heard. Pulmonary/Chest: Effort normal and breath sounds normal. No stridor. No respiratory distress. She has no wheezes. She has no rales. She exhibits no tenderness.  Musculoskeletal: Normal range of motion. She exhibits no edema.  Neurological: She is alert and oriented to person, place, and time. Coordination normal.  Skin: Skin is warm. No rash noted.  Psychiatric: She has a normal mood and affect. Her behavior is normal. Judgment and thought content normal.  Nursing note and vitals reviewed.    ED Treatments / Results  Labs (all labs ordered are listed, but only abnormal results are displayed) Labs Reviewed  BASIC METABOLIC PANEL - Abnormal; Notable for the following:       Result Value   Glucose, Bld 163 (*)    Calcium 8.7 (*)    All other components within normal limits  CBC - Abnormal; Notable for the following:    Hemoglobin 11.4 (*)    HCT 34.9 (*)    All other components within normal limits  URINALYSIS, ROUTINE W REFLEX MICROSCOPIC - Abnormal; Notable for the following:    Color, Urine STRAW (*)    Glucose, UA >=500 (*)    Hgb urine dipstick SMALL (*)    Ketones, ur 20 (*)    All other components within normal limits  CBG MONITORING, ED - Abnormal; Notable for the following:    Glucose-Capillary 166 (*)    All other components within normal limits  CBG MONITORING, ED    EKG  EKG Interpretation None       Radiology No results found.  Procedures Procedures  (including critical care time)  Medications Ordered in ED Medications - No data to display   Initial Impression / Assessment and Plan / ED Course  I have reviewed the triage vital signs and the nursing notes.  Pertinent labs & imaging results that were available during my care of the patient were reviewed by me and considered in my medical decision making (see chart for details).      Final Clinical Impressions(s) / ED Diagnoses   Final diagnoses:  Fatigue, unspecified type  Hyperglycemia    Labs:  UA, BMP, CBC, CBG  Imaging:  Consults:  Therapeutics:  Discharge Meds:   Assessment/Plan: 31 year old female presents today with complaints of hyperglycemia.  Patient reports she  feels like she is in DKA.  She is well-appearing in no acute distress.  Reassuring vital signs, no signs of DKA on laboratory analysis.  Patient's workup here is reassuring, uncertain etiology as she has no infectious signs or symptoms, no physical signs or symptoms, or laboratory findings that would indicate her fatigue.  She has no lower extremity swelling or edema, shortness of breath or chest pain.  Patient will follow-up as an outpatient with her primary care for reassessment and ongoing management.  She is given strict return precautions, she verbalized understanding and agreement to today's plan had no further questions or concerns.    New Prescriptions New Prescriptions   No medications on file     Francee Gentile 04/23/17 1252    Carmin Muskrat, MD 04/23/17 1651

## 2017-04-23 NOTE — ED Triage Notes (Signed)
Pt presents to the ed with complaints of feeling like she is in DKA for the past week. States that she has been really thirsty and feeling generally ill.  Her blood sugar was 110 this morning. The patient is an insulin dependent diabetic. Blood sugar 166 in triage.

## 2017-04-23 NOTE — Discharge Instructions (Signed)
Please read attached information. If you experience any new or worsening signs or symptoms please return to the emergency room for evaluation. Please follow-up with your primary care provider or specialist as discussed.  °

## 2017-05-18 ENCOUNTER — Encounter (HOSPITAL_COMMUNITY): Payer: Self-pay

## 2017-05-18 ENCOUNTER — Emergency Department (HOSPITAL_COMMUNITY): Payer: Self-pay

## 2017-05-18 ENCOUNTER — Inpatient Hospital Stay (HOSPITAL_COMMUNITY)
Admission: EM | Admit: 2017-05-18 | Discharge: 2017-05-20 | DRG: 639 | Disposition: A | Discharge status: Home / Self Care | Payer: No Typology Code available for payment source | Attending: Internal Medicine | Admitting: Internal Medicine

## 2017-05-18 DIAGNOSIS — K529 Noninfective gastroenteritis and colitis, unspecified: Secondary | ICD-10-CM

## 2017-05-18 DIAGNOSIS — F329 Major depressive disorder, single episode, unspecified: Secondary | ICD-10-CM | POA: Diagnosis present

## 2017-05-18 DIAGNOSIS — Z91138 Patient's unintentional underdosing of medication regimen for other reason: Secondary | ICD-10-CM

## 2017-05-18 DIAGNOSIS — D649 Anemia, unspecified: Secondary | ICD-10-CM | POA: Diagnosis present

## 2017-05-18 DIAGNOSIS — R9431 Abnormal electrocardiogram [ECG] [EKG]: Secondary | ICD-10-CM

## 2017-05-18 DIAGNOSIS — E860 Dehydration: Secondary | ICD-10-CM | POA: Diagnosis present

## 2017-05-18 DIAGNOSIS — Z79899 Other long term (current) drug therapy: Secondary | ICD-10-CM

## 2017-05-18 DIAGNOSIS — F411 Generalized anxiety disorder: Secondary | ICD-10-CM | POA: Diagnosis present

## 2017-05-18 DIAGNOSIS — I4581 Long QT syndrome: Secondary | ICD-10-CM | POA: Diagnosis present

## 2017-05-18 DIAGNOSIS — E109 Type 1 diabetes mellitus without complications: Secondary | ICD-10-CM | POA: Diagnosis present

## 2017-05-18 DIAGNOSIS — J309 Allergic rhinitis, unspecified: Secondary | ICD-10-CM | POA: Diagnosis present

## 2017-05-18 DIAGNOSIS — T383X6A Underdosing of insulin and oral hypoglycemic [antidiabetic] drugs, initial encounter: Secondary | ICD-10-CM | POA: Diagnosis present

## 2017-05-18 DIAGNOSIS — Z833 Family history of diabetes mellitus: Secondary | ICD-10-CM

## 2017-05-18 DIAGNOSIS — E1010 Type 1 diabetes mellitus with ketoacidosis without coma: Principal | ICD-10-CM | POA: Diagnosis present

## 2017-05-18 DIAGNOSIS — E1110 Type 2 diabetes mellitus with ketoacidosis without coma: Secondary | ICD-10-CM | POA: Diagnosis present

## 2017-05-18 DIAGNOSIS — Z794 Long term (current) use of insulin: Secondary | ICD-10-CM

## 2017-05-18 LAB — COMPREHENSIVE METABOLIC PANEL
ALT: 32 U/L (ref 14–54)
AST: 51 U/L — ABNORMAL HIGH (ref 15–41)
Albumin: 5.3 g/dL — ABNORMAL HIGH (ref 3.5–5.0)
Alkaline Phosphatase: 86 U/L (ref 38–126)
BUN: 20 mg/dL (ref 6–20)
CO2: <7 mmol/L — ABNORMAL LOW (ref 22–32)
Calcium: 10.2 mg/dL (ref 8.9–10.3)
Chloride: 101 mmol/L (ref 101–111)
Creatinine, Ser: 1.48 mg/dL — ABNORMAL HIGH (ref 0.44–1.00)
GFR calc Af Amer: 54 mL/min — ABNORMAL LOW (ref >60)
GFR calc non Af Amer: 47 mL/min — ABNORMAL LOW (ref >60)
Glucose, Bld: 501 mg/dL (ref 65–99)
Potassium: 6.8 mmol/L (ref 3.5–5.1)
Sodium: 131 mmol/L — ABNORMAL LOW (ref 135–145)
Total Bilirubin: 1.8 mg/dL — ABNORMAL HIGH (ref 0.3–1.2)
Total Protein: 9.7 g/dL — ABNORMAL HIGH (ref 6.5–8.1)

## 2017-05-18 LAB — CBC
HCT: 46 % (ref 36.0–46.0)
Hemoglobin: 15.1 g/dL — ABNORMAL HIGH (ref 12.0–15.0)
MCH: 28.9 pg (ref 26.0–34.0)
MCHC: 32.8 g/dL (ref 30.0–36.0)
MCV: 88.1 fL (ref 78.0–100.0)
Platelets: 382 10*3/uL (ref 150–400)
RBC: 5.22 MIL/uL — ABNORMAL HIGH (ref 3.87–5.11)
RDW: 13.8 % (ref 11.5–15.5)
WBC: 18.2 10*3/uL — ABNORMAL HIGH (ref 4.0–10.5)

## 2017-05-18 LAB — BLOOD GAS, VENOUS
FIO2: 0.21
O2 Saturation: 75.6 %
Patient temperature: 98.4
pH, Ven: 7.016 — CL (ref 7.250–7.430)
pO2, Ven: 52.3 mmHg — ABNORMAL HIGH (ref 32.0–45.0)

## 2017-05-18 LAB — URINALYSIS, ROUTINE W REFLEX MICROSCOPIC
Bilirubin Urine: NEGATIVE
Glucose, UA: >=500 mg/dL — AB
Ketones, ur: 80 mg/dL — AB
Leukocytes, UA: NEGATIVE
Nitrite: NEGATIVE
Protein, ur: 100 mg/dL — AB
Specific Gravity, Urine: 1.02 (ref 1.005–1.030)
pH: 5 (ref 5.0–8.0)

## 2017-05-18 LAB — I-STAT BETA HCG BLOOD, ED (MC, WL, AP ONLY): I-stat hCG, quantitative: <5 m[IU]/mL (ref <5)

## 2017-05-18 LAB — CBG MONITORING, ED
Glucose-Capillary: 523 mg/dL (ref 65–99)
Glucose-Capillary: 486 mg/dL — ABNORMAL HIGH (ref 65–99)

## 2017-05-18 LAB — LIPASE, BLOOD: Lipase: 16 U/L (ref 11–51)

## 2017-05-18 MED ORDER — MORPHINE SULFATE (PF) 4 MG/ML IV SOLN
4.0000 mg | Freq: Once | INTRAVENOUS | Status: AC
  Administered 2017-05-18: 4 mg via INTRAVENOUS
  Filled 2017-05-18: qty 1

## 2017-05-18 MED ORDER — SODIUM BICARBONATE 8.4 % IV SOLN
50.0000 meq | Freq: Once | INTRAVENOUS | Status: AC
  Administered 2017-05-18: 50 meq via INTRAVENOUS
  Filled 2017-05-18: qty 50

## 2017-05-18 MED ORDER — SODIUM CHLORIDE 0.9 % IV BOLUS (SEPSIS)
1000.0000 mL | Freq: Once | INTRAVENOUS | Status: AC
  Administered 2017-05-18: 1000 mL via INTRAVENOUS

## 2017-05-18 MED ORDER — ALBUTEROL SULFATE (2.5 MG/3ML) 0.083% IN NEBU
2.5000 mg | INHALATION_SOLUTION | Freq: Once | RESPIRATORY_TRACT | Status: AC
  Administered 2017-05-18: 2.5 mg via RESPIRATORY_TRACT
  Filled 2017-05-18: qty 3

## 2017-05-18 MED ORDER — METOCLOPRAMIDE HCL 5 MG/ML IJ SOLN
10.0000 mg | Freq: Once | INTRAMUSCULAR | Status: AC
  Administered 2017-05-18: 10 mg via INTRAVENOUS
  Filled 2017-05-18: qty 2

## 2017-05-18 MED ORDER — SODIUM BICARBONATE 8.4 % IV SOLN
Freq: Once | INTRAVENOUS | Status: DC
  Filled 2017-05-18: qty 100

## 2017-05-18 MED ORDER — ALBUTEROL SULFATE (2.5 MG/3ML) 0.083% IN NEBU: 10.0000 mg | INHALATION_SOLUTION | Freq: Once | RESPIRATORY_TRACT | Status: DC

## 2017-05-18 MED ORDER — LACTATED RINGERS IV BOLUS (SEPSIS)
1000.0000 mL | Freq: Once | INTRAVENOUS | Status: AC
  Administered 2017-05-18: 1000 mL via INTRAVENOUS

## 2017-05-18 MED ORDER — SODIUM CHLORIDE 0.9 % IV SOLN: 1.0000 g | Freq: Once | INTRAVENOUS | Status: DC

## 2017-05-18 MED ORDER — FAMOTIDINE IN NACL 20-0.9 MG/50ML-% IV SOLN
20.0000 mg | Freq: Once | INTRAVENOUS | Status: AC
  Administered 2017-05-18: 20 mg via INTRAVENOUS
  Filled 2017-05-18: qty 50

## 2017-05-18 MED ORDER — CALCIUM GLUCONATE 10 % IV SOLN
2.0000 g | Freq: Once | INTRAVENOUS | Status: AC
  Administered 2017-05-18: 2 g via INTRAVENOUS
  Filled 2017-05-18: qty 20

## 2017-05-18 MED ORDER — IOPAMIDOL (ISOVUE-300) INJECTION 61%
INTRAVENOUS | Status: AC
  Administered 2017-05-18: 100 mL
  Filled 2017-05-18: qty 100

## 2017-05-18 MED ORDER — GI COCKTAIL ~~LOC~~
30.0000 mL | Freq: Once | ORAL | Status: AC
  Administered 2017-05-18: 30 mL via ORAL
  Filled 2017-05-18: qty 30

## 2017-05-18 MED ORDER — SODIUM CHLORIDE 0.9 % IV SOLN
INTRAVENOUS | Status: DC
  Administered 2017-05-18: 4.3 [IU]/h via INTRAVENOUS
  Filled 2017-05-18: qty 1

## 2017-05-18 NOTE — ED Provider Notes (Signed)
Medical screening examination/treatment/procedure(s) were conducted as a shared visit with non-physician practitioner(s) and myself.  I personally evaluated the patient during the encounter.  31 year old female who has history of diabetes that is insulin dependent who recently had a GI illness which is having vomiting diarrhea and she did not take her insulin for the last couple days but comes here with worsening vomiting and abdominal pain.   On exam patient is tachycardic, tachypneic and sleepy.  Able to participate in exam but very slow with responding to questions.  No neurologic symptoms or headaches at this time.  Abdomen is slightly tender to palpation diffusely but no rebound or guarding or evidence of peritonitis.  Patient with elevated blood sugar obviously in diabetic ketoacidosis.  EKG keeps read as ventricular tachycardia however I think it is more that she has peak T waves.  Plan will be started insulin give hyperkalemia protocol until the insulin is time to transition the potassium intracellularly.  Will need large amounts of fluid.  CT scan for abdominal findings. Admission for same depending on response to therapies.  CRITICAL CARE Performed by: Merrily Pew Total critical care time: 35 minutes Critical care time was exclusive of separately billable procedures and treating other patients. Critical care was necessary to treat or prevent imminent or life-threatening deterioration. Critical care was time spent personally by me on the following activities: development of treatment plan with patient and/or surrogate as well as nursing, discussions with consultants, evaluation of patient's response to treatment, examination of patient, obtaining history from patient or surrogate, ordering and performing treatments and interventions, ordering and review of laboratory studies, ordering and review of radiographic studies, pulse oximetry and re-evaluation of patient's condition.    EKG  Interpretation  Date/Time:  Monday May 18 2017 22:06:45 EDT Ventricular Rate:  122 PR Interval:    QRS Duration: 110 QT Interval:  367 QTC Calculation: 523 R Axis:   -113 Text Interpretation:  Sinus tachycardia Consider right atrial enlargement Inferior infarct, old Prolonged QT interval peaked T waves Confirmed by Merrily Pew [phone]) on 05/18/2017 10:24:51 PM         Elaine Middleton, Corene Cornea, MD 05/25/17 1038

## 2017-05-18 NOTE — ED Notes (Signed)
Pt's CBG=486

## 2017-05-18 NOTE — ED Notes (Signed)
I looked and felt and did not see anywhere to collect labs from.  I made nurse aware

## 2017-05-18 NOTE — ED Notes (Signed)
Date and time results received: 05/18/17 "9:57 PM    Test: Potassium Critical Value: 6.8  Name of Provider Notified: Ken-Tyler Leaphart  Orders Received? Or Actions Taken?:

## 2017-05-18 NOTE — ED Triage Notes (Signed)
Per EMS- Patient c/o M/V/D and mid abdominal pain since 0300. Patient was given zofran ODT with no relief.

## 2017-05-19 DIAGNOSIS — E1010 Type 1 diabetes mellitus with ketoacidosis without coma: Secondary | ICD-10-CM | POA: Diagnosis present

## 2017-05-19 DIAGNOSIS — K529 Noninfective gastroenteritis and colitis, unspecified: Secondary | ICD-10-CM

## 2017-05-19 DIAGNOSIS — R9431 Abnormal electrocardiogram [ECG] [EKG]: Secondary | ICD-10-CM

## 2017-05-19 DIAGNOSIS — E109 Type 1 diabetes mellitus without complications: Secondary | ICD-10-CM | POA: Diagnosis present

## 2017-05-19 LAB — BASIC METABOLIC PANEL
Anion gap: 16 — ABNORMAL HIGH (ref 5–15)
Anion gap: 11 (ref 5–15)
Anion gap: 11 (ref 5–15)
Anion gap: 8 (ref 5–15)
BUN: 16 mg/dL (ref 6–20)
BUN: 14 mg/dL (ref 6–20)
BUN: 12 mg/dL (ref 6–20)
BUN: 11 mg/dL (ref 6–20)
BUN: 11 mg/dL (ref 6–20)
CO2: <7 mmol/L — ABNORMAL LOW (ref 22–32)
CO2: 9 mmol/L — ABNORMAL LOW (ref 22–32)
CO2: 11 mmol/L — ABNORMAL LOW (ref 22–32)
CO2: 13 mmol/L — ABNORMAL LOW (ref 22–32)
CO2: 16 mmol/L — ABNORMAL LOW (ref 22–32)
Calcium: 9.3 mg/dL (ref 8.9–10.3)
Calcium: 9.4 mg/dL (ref 8.9–10.3)
Calcium: 9.1 mg/dL (ref 8.9–10.3)
Calcium: 9.1 mg/dL (ref 8.9–10.3)
Calcium: 9 mg/dL (ref 8.9–10.3)
Chloride: 110 mmol/L (ref 101–111)
Chloride: 113 mmol/L — ABNORMAL HIGH (ref 101–111)
Chloride: 113 mmol/L — ABNORMAL HIGH (ref 101–111)
Chloride: 111 mmol/L (ref 101–111)
Chloride: 111 mmol/L (ref 101–111)
Creatinine, Ser: 1.4 mg/dL — ABNORMAL HIGH (ref 0.44–1.00)
Creatinine, Ser: 1.16 mg/dL — ABNORMAL HIGH (ref 0.44–1.00)
Creatinine, Ser: 1.04 mg/dL — ABNORMAL HIGH (ref 0.44–1.00)
Creatinine, Ser: 0.98 mg/dL (ref 0.44–1.00)
Creatinine, Ser: 0.81 mg/dL (ref 0.44–1.00)
GFR calc Af Amer: 58 mL/min — ABNORMAL LOW (ref >60)
GFR calc Af Amer: >60 mL/min (ref >60)
GFR calc Af Amer: >60 mL/min (ref >60)
GFR calc Af Amer: >60 mL/min (ref >60)
GFR calc Af Amer: >60 mL/min (ref >60)
GFR calc non Af Amer: 50 mL/min — ABNORMAL LOW (ref >60)
GFR calc non Af Amer: >60 mL/min (ref >60)
GFR calc non Af Amer: >60 mL/min (ref >60)
GFR calc non Af Amer: >60 mL/min (ref >60)
GFR calc non Af Amer: >60 mL/min (ref >60)
Glucose, Bld: 293 mg/dL — ABNORMAL HIGH (ref 65–99)
Glucose, Bld: 153 mg/dL — ABNORMAL HIGH (ref 65–99)
Glucose, Bld: 177 mg/dL — ABNORMAL HIGH (ref 65–99)
Glucose, Bld: 222 mg/dL — ABNORMAL HIGH (ref 65–99)
Glucose, Bld: 207 mg/dL — ABNORMAL HIGH (ref 65–99)
Potassium: 5.3 mmol/L — ABNORMAL HIGH (ref 3.5–5.1)
Potassium: 4.4 mmol/L (ref 3.5–5.1)
Potassium: 4.3 mmol/L (ref 3.5–5.1)
Potassium: 3.8 mmol/L (ref 3.5–5.1)
Potassium: 3.8 mmol/L (ref 3.5–5.1)
Sodium: 137 mmol/L (ref 135–145)
Sodium: 138 mmol/L (ref 135–145)
Sodium: 135 mmol/L (ref 135–145)
Sodium: 135 mmol/L (ref 135–145)
Sodium: 135 mmol/L (ref 135–145)

## 2017-05-19 LAB — CBG MONITORING, ED
Glucose-Capillary: 305 mg/dL — ABNORMAL HIGH (ref 65–99)
Glucose-Capillary: 314 mg/dL — ABNORMAL HIGH (ref 65–99)
Glucose-Capillary: 421 mg/dL — ABNORMAL HIGH (ref 65–99)

## 2017-05-19 LAB — LACTIC ACID, PLASMA
Lactic Acid, Venous: 2.3 mmol/L (ref 0.5–1.9)
Lactic Acid, Venous: 1.7 mmol/L (ref 0.5–1.9)

## 2017-05-19 LAB — GLUCOSE, CAPILLARY
Glucose-Capillary: 270 mg/dL — ABNORMAL HIGH (ref 65–99)
Glucose-Capillary: 180 mg/dL — ABNORMAL HIGH (ref 65–99)
Glucose-Capillary: 123 mg/dL — ABNORMAL HIGH (ref 65–99)
Glucose-Capillary: 134 mg/dL — ABNORMAL HIGH (ref 65–99)
Glucose-Capillary: 152 mg/dL — ABNORMAL HIGH (ref 65–99)
Glucose-Capillary: 171 mg/dL — ABNORMAL HIGH (ref 65–99)
Glucose-Capillary: 154 mg/dL — ABNORMAL HIGH (ref 65–99)
Glucose-Capillary: 142 mg/dL — ABNORMAL HIGH (ref 65–99)
Glucose-Capillary: 124 mg/dL — ABNORMAL HIGH (ref 65–99)
Glucose-Capillary: 208 mg/dL — ABNORMAL HIGH (ref 65–99)
Glucose-Capillary: 179 mg/dL — ABNORMAL HIGH (ref 65–99)
Glucose-Capillary: 153 mg/dL — ABNORMAL HIGH (ref 65–99)
Glucose-Capillary: 264 mg/dL — ABNORMAL HIGH (ref 65–99)
Glucose-Capillary: 182 mg/dL — ABNORMAL HIGH (ref 65–99)
Glucose-Capillary: 203 mg/dL — ABNORMAL HIGH (ref 65–99)
Glucose-Capillary: 144 mg/dL — ABNORMAL HIGH (ref 65–99)
Glucose-Capillary: 131 mg/dL — ABNORMAL HIGH (ref 65–99)
Glucose-Capillary: 120 mg/dL — ABNORMAL HIGH (ref 65–99)

## 2017-05-19 LAB — MRSA PCR SCREENING: MRSA by PCR: NEGATIVE

## 2017-05-19 MED ORDER — ENOXAPARIN SODIUM 40 MG/0.4ML ~~LOC~~ SOLN
40.0000 mg | Freq: Every day | SUBCUTANEOUS | Status: DC
  Administered 2017-05-19: 40 mg via SUBCUTANEOUS
  Filled 2017-05-19: qty 0.4

## 2017-05-19 MED ORDER — PIPERACILLIN-TAZOBACTAM 4.5 G IVPB: 4.5000 g | Freq: Once | INTRAVENOUS | Status: DC

## 2017-05-19 MED ORDER — ZOLPIDEM TARTRATE 5 MG PO TABS: 5.0000 mg | ORAL_TABLET | Freq: Every evening | ORAL | Status: DC | PRN

## 2017-05-19 MED ORDER — PIPERACILLIN-TAZOBACTAM 3.375 G IVPB
3.3750 g | Freq: Three times a day (TID) | INTRAVENOUS | Status: DC
  Administered 2017-05-19 – 2017-05-20 (×4): 3.375 g via INTRAVENOUS
  Filled 2017-05-19 (×5): qty 50

## 2017-05-19 MED ORDER — VITAMIN C 500 MG PO TABS
500.0000 mg | ORAL_TABLET | Freq: Every day | ORAL | Status: DC
  Administered 2017-05-19 – 2017-05-20 (×2): 500 mg via ORAL
  Filled 2017-05-19 (×2): qty 1

## 2017-05-19 MED ORDER — ORAL CARE MOUTH RINSE
15.0000 mL | Freq: Two times a day (BID) | OROMUCOSAL | Status: DC
  Administered 2017-05-19: 15 mL via OROMUCOSAL

## 2017-05-19 MED ORDER — ACETAMINOPHEN 650 MG RE SUPP: 650.0000 mg | Freq: Four times a day (QID) | RECTAL | Status: DC | PRN

## 2017-05-19 MED ORDER — INSULIN DETEMIR 100 UNIT/ML ~~LOC~~ SOLN
20.0000 [IU] | Freq: Every day | SUBCUTANEOUS | Status: DC
  Administered 2017-05-19: 20 [IU] via SUBCUTANEOUS
  Filled 2017-05-19 (×2): qty 0.2

## 2017-05-19 MED ORDER — LORATADINE 10 MG PO TABS
10.0000 mg | ORAL_TABLET | Freq: Every day | ORAL | Status: DC
Start: 2017-05-19 — End: 2017-05-20
  Administered 2017-05-19 – 2017-05-20 (×2): 10 mg via ORAL
  Filled 2017-05-19 (×2): qty 1

## 2017-05-19 MED ORDER — PIPERACILLIN-TAZOBACTAM 3.375 G IVPB 30 MIN: 3.3750 g | Freq: Three times a day (TID) | INTRAVENOUS | Status: DC

## 2017-05-19 MED ORDER — DEXTROSE-NACL 5-0.45 % IV SOLN
INTRAVENOUS | Status: DC
  Administered 2017-05-19: 05:00:00 via INTRAVENOUS

## 2017-05-19 MED ORDER — CHLORHEXIDINE GLUCONATE 0.12 % MT SOLN
15.0000 mL | Freq: Two times a day (BID) | OROMUCOSAL | Status: DC
  Administered 2017-05-19 (×2): 15 mL via OROMUCOSAL
  Filled 2017-05-19: qty 15

## 2017-05-19 MED ORDER — SODIUM CHLORIDE 0.9 % IV SOLN
INTRAVENOUS | Status: DC
  Administered 2017-05-19: 02:00:00 via INTRAVENOUS

## 2017-05-19 MED ORDER — PHENOL 1.4 % MT LIQD
1.0000 | OROMUCOSAL | Status: DC | PRN
  Filled 2017-05-19: qty 177

## 2017-05-19 MED ORDER — VITAMIN D3 25 MCG (1000 UNIT) PO TABS
1000.0000 [IU] | ORAL_TABLET | Freq: Every day | ORAL | Status: DC
  Administered 2017-05-19 – 2017-05-20 (×2): 1000 [IU] via ORAL
  Filled 2017-05-19 (×2): qty 1

## 2017-05-19 MED ORDER — PIPERACILLIN-TAZOBACTAM 3.375 G IVPB 30 MIN
3.3750 g | Freq: Once | INTRAVENOUS | Status: AC
  Administered 2017-05-19: 3.375 g via INTRAVENOUS
  Filled 2017-05-19: qty 50

## 2017-05-19 MED ORDER — METOCLOPRAMIDE HCL 5 MG/ML IJ SOLN: 5.0000 mg | Freq: Three times a day (TID) | INTRAMUSCULAR | Status: DC | PRN

## 2017-05-19 MED ORDER — ACETAMINOPHEN 325 MG PO TABS
650.0000 mg | ORAL_TABLET | Freq: Four times a day (QID) | ORAL | Status: DC | PRN
  Administered 2017-05-19: 650 mg via ORAL
  Filled 2017-05-19: qty 2

## 2017-05-19 MED ORDER — HYDROCODONE-ACETAMINOPHEN 5-325 MG PO TABS: 1.0000 | ORAL_TABLET | ORAL | Status: DC | PRN

## 2017-05-19 MED ORDER — MENTHOL 3 MG MT LOZG
1.0000 | LOZENGE | OROMUCOSAL | Status: DC | PRN
  Filled 2017-05-19: qty 9

## 2017-05-19 MED ORDER — INSULIN ASPART 100 UNIT/ML ~~LOC~~ SOLN: 0.0000 [IU] | SUBCUTANEOUS | Status: DC

## 2017-05-19 NOTE — Progress Notes (Signed)
Met with patient this AM.  Discussed with patient diagnosis of DKA (pathophysiology), treatment of DKA, lab results, and transition plan to SQ insulin regimen.  Explained in detail how DKA can occur as pt had several questions as to why she developed DKA.  Encouraged pt to always take her Levemir insulin every day (even when unable to eat) and to check her CBGs more often Q4 hours and to take her Novolog SSI if her CBGs are high and she is unable to eat.  Patient stated she has a sliding scale she uses at home and also counts carb and covers carbs on a 1 unit Novolog for every 15 grams carbohydrates.  Explained to pt that when she is sick, her CBGs will likely be higher and that she can use her sliding scale every 4 hours if needed if her CBGs are elevated.  Pt asked me about meal coverage insulin when she is sick.  Reminded pt to take her Novolog food coverage if she is eating but to only take the sliding scale and Levemir if she is unable to eat.  Also encouraged pt to stay hydrated with water if able to keep fluids down when sick and CBGs are high.  Patient told me she has recently started a job and hopes to have insurance in 3 months.  Gave patient the names of several local Endocrinologists in town for reference.    --Will follow patient during hospitalization--  Wyn Quaker RN, MSN, CDE Diabetes Coordinator Inpatient Glycemic Control Team Team Pager: [phone] (8a-5p)

## 2017-05-19 NOTE — ED Provider Notes (Signed)
[facility] DEPT Provider Note   CSN: [id number] Arrival date & time: 05/18/17  1824     History   Chief Complaint Chief Complaint  Patient presents with  . Emesis  . Diarrhea  . Hyperglycemia    HPI [name] is a 31 y.o. female.  HPI 31 year old African-American female past medical history significant for type 1 diabetes presents to the emergency department today with complaints of nausea, vomiting, diarrhea and epigastric abdominal pain since 3 AM this morning.  Patient states that she thinks she may have gotten food poisoning last night after eating [place] burgers.  States that since last evening she has had 15-20 episodes of nonbloody nonbilious emesis along with several episodes of loose stool without melena or hematochezia.  Patient reports cramping epigastric abdominal pain that is constant.  Not relieved by anything.  Nothing makes worse.  She has not tried anything for symptoms prior to arrival.  Patient also states that she has not taken her insulin today.  Reports chills but denies any fevers.  Denies any urinary symptoms, vaginal symptoms, chest pain, shortness of breath, lightheadedness, dizziness, headache, vision changes. Past Medical History:  Diagnosis Date  . Diabetes mellitus without complication [facility] ([place]     Patient Active Problem List   Diagnosis Date Noted  . DKA, type 1 ([place]) 05/19/2017  . Hyperpigmentation of skin, postinflammatory 08/07/2016  . Dizziness 04/25/2016  . DKA (diabetic ketoacidoses) ([place]) 04/25/2016  . Rhinitis, allergic 10/11/2015  . Diabetes type 1, uncontrolled ([place]) 07/25/2015  . Cephalalgia 07/25/2015  . Generalized anxiety disorder 07/25/2015  . Depression 07/25/2015  . Diabetic ketoacidosis without coma associated with type 1 diabetes mellitus ([place]) 07/11/2012  . Leukocytosis 07/11/2012  . Hyperkalemia 07/11/2012  . High anion gap metabolic acidosis 26/33/3545    History reviewed. No  pertinent surgical history.  OB History    No data available       Home Medications    Prior to Admission medications   Medication Sig Start Date End Date Taking? Authorizing Provider  cholecalciferol (VITAMIN D) 1000 units tablet Take 1,000 Units by mouth daily.   Yes [provider]  insulin aspart (NOVOLOG) 100 UNIT/ML injection Inject 0-20 Units into the skin 3 (three) times daily before meals. 08/07/16  Yes [name], FNP  insulin detemir (LEVEMIR) 100 unit/ml SOLN Inject 0.2 mLs (20 Units total) into the skin at bedtime. 08/07/16  Yes [name], FNP  loratadine (CLARITIN) 10 MG tablet Take 10 mg by mouth daily.   Yes [provider]  Probiotic Product (PROBIOTIC DAILY PO) Take by mouth.   Yes [provider]  vitamin C (ASCORBIC ACID) 500 MG tablet Take 500 mg by mouth daily.   Yes [provider]  Blood Glucose Monitoring Suppl (TRUE METRIX AIR GLUCOSE METER) DEVI 1 each by Does not apply route 4 (four) times daily -  with meals and at bedtime. 07/25/15   [name], FNP  glucose blood (TRUE METRIX BLOOD GLUCOSE TEST) test strip Use as instructed 07/25/15   [name], FNP  hydroquinone 4 % cream Apply topically 2 (two) times daily. Patient not taking: Reported on 05/18/2017 09/30/16   [name], FNP  INSULIN SYRINGE .5CC/29G 29G X 1/2" 0.5 ML MISC 1 each by Does not apply route 4 (four) times daily - after meals and at bedtime. 07/25/15   [name], FNP  Lancet Device MISC 1 each by Does not apply route 4 (  four) times daily - after meals and at bedtime. 07/25/15   [name], FNP    Family History Family History  Problem Relation Age of Onset  . Throat cancer Mother   . Diabetes Maternal Aunt     Social History Social History  Substance Use Topics  . Smoking status: Never Smoker  . Smokeless tobacco: Never Used  . Alcohol use Yes     Comment: occasionally      Allergies   Patient  has no known allergies.   Review of Systems Review of Systems  Constitutional: Positive for chills. Negative for fever.  HENT: Negative for congestion.   Eyes: Negative for visual disturbance.  Respiratory: Negative for cough and shortness of breath.   Cardiovascular: Negative for chest pain.  Gastrointestinal: Positive for abdominal pain, diarrhea, nausea and vomiting. Negative for constipation.  Genitourinary: Negative for dysuria, flank pain, frequency, hematuria, urgency, vaginal bleeding and vaginal discharge.  Musculoskeletal: Negative for arthralgias and myalgias.  Skin: Negative for rash.  Neurological: Negative for dizziness, syncope, weakness, light-headedness, numbness and headaches.  Psychiatric/Behavioral: Negative for sleep disturbance. The patient is not nervous/anxious.      Physical Exam Updated Vital Signs BP 136/68   Pulse (!) 142   Temp 98.5 F (36.9 C) (Rectal)   Resp 20   Ht 5\' 6"  (1.676 m)   Wt 70.8 kg (156 lb)   LMP 04/18/2017 (Approximate)   SpO2 100%   BMI 25.18 kg/m   Physical Exam  Constitutional: She is oriented to person, place, and time. She appears well-developed and well-nourished.  Non-toxic appearance. No distress.  HENT:  Head: Normocephalic and atraumatic.  Nose: Nose normal.  Mouth/Throat: Oropharynx is clear and moist.  Eyes: Pupils are equal, round, and reactive to light. Conjunctivae are normal. Right eye exhibits no discharge. Left eye exhibits no discharge.  Neck: Normal range of motion. Neck supple.  Cardiovascular: Regular rhythm, normal heart sounds and intact distal pulses.  Exam reveals no gallop and no friction rub.   No murmur heard. Tachycardia   Pulmonary/Chest: Accessory muscle usage present. Tachypnea noted. No apnea. No respiratory distress. She has decreased breath sounds. She has no wheezes. She has no rhonchi. She has no rales. She exhibits no tenderness.  Abdominal: Soft. Bowel sounds are normal. There is  tenderness in the epigastric area. There is no rigidity, no rebound, no guarding, no CVA tenderness, no tenderness at McBurney's point and negative Murphy's sign.  Musculoskeletal: Normal range of motion. She exhibits no tenderness.  Lymphadenopathy:    She has no cervical adenopathy.  Neurological: She is alert and oriented to person, place, and time.  Pt is sleepy but responds to verbal stimuli slowly.  Grip strength normal.  Cranial nerves II through XII are grossly intact.  Sensation intact in all dermatomes.  Skin: Skin is warm and dry. Capillary refill takes less than 2 seconds.  Psychiatric: Her behavior is normal. Judgment and thought content normal.  Nursing note and vitals reviewed.    ED Treatments / Results  Labs (all labs ordered are listed, but only abnormal results are displayed) Labs Reviewed  CBC - Abnormal; Notable for the following:       Result Value   WBC 18.2 (*)    RBC 5.22 (*)    Hemoglobin 15.1 (*)    All other components within normal limits  URINALYSIS, ROUTINE W REFLEX MICROSCOPIC - Abnormal; Notable for the following:    Color, Urine STRAW (*)    Glucose,  UA >=500 (*)    Hgb urine dipstick SMALL (*)    Ketones, ur 80 (*)    Protein, ur 100 (*)    Bacteria, UA RARE (*)    Squamous Epithelial / LPF 0-5 (*)    All other components within normal limits  COMPREHENSIVE METABOLIC PANEL - Abnormal; Notable for the following:    Sodium 131 (*)    Potassium 6.8 (*)    CO2 <7 (*)    Glucose, Bld 501 (*)    Creatinine, Ser 1.48 (*)    Total Protein 9.7 (*)    Albumin 5.3 (*)    AST 51 (*)    Total Bilirubin 1.8 (*)    GFR calc non Af Amer 47 (*)    GFR calc Af Amer 54 (*)    All other components within normal limits  BLOOD GAS, VENOUS - Abnormal; Notable for the following:    pH, Ven 7.016 (*)    pO2, Ven 52.3 (*)    All other components within normal limits  CBG MONITORING, ED - Abnormal; Notable for the following:    Glucose-Capillary 523 (*)     All other components within normal limits  CBG MONITORING, ED - Abnormal; Notable for the following:    Glucose-Capillary 486 (*)    All other components within normal limits  CBG MONITORING, ED - Abnormal; Notable for the following:    Glucose-Capillary 421 (*)    All other components within normal limits  CBG MONITORING, ED - Abnormal; Notable for the following:    Glucose-Capillary 305 (*)    All other components within normal limits  CULTURE, BLOOD (ROUTINE X 2)  CULTURE, BLOOD (ROUTINE X 2)  LIPASE, BLOOD  BASIC METABOLIC PANEL  BASIC METABOLIC PANEL  BASIC METABOLIC PANEL  LACTIC ACID, PLASMA  LACTIC ACID, PLASMA  I-STAT BETA HCG BLOOD, ED ([facility], [facility], [facility] ONLY)    EKG  EKG Interpretation  Date/Time:  Monday May 18 2017 22:06:45 EDT Ventricular Rate:  122 PR Interval:    QRS Duration: 110 QT Interval:  367 QTC Calculation: 523 R Axis:   -113 Text Interpretation:  Sinus tachycardia Consider right atrial enlargement Inferior infarct, old Prolonged QT interval peaked T waves Confirmed by Merrily Pew [phone]) on 05/18/2017 10:24:51 PM       Radiology Ct Abdomen Pelvis W Contrast  Result Date: 05/18/2017 CLINICAL DATA:  Abdominal pain with nausea vomiting and diarrhea EXAM: CT ABDOMEN AND PELVIS WITH CONTRAST TECHNIQUE: Multidetector CT imaging of the abdomen and pelvis was performed using the standard protocol following bolus administration of intravenous contrast. CONTRAST:  141mL ISOVUE-300 IOPAMIDOL (ISOVUE-300) INJECTION 61% COMPARISON:  04/11/2014 FINDINGS: Lower chest: No acute abnormality. Hepatobiliary: No focal liver abnormality is seen. No gallstones, gallbladder wall thickening, or biliary dilatation. Pancreas: Unremarkable. No pancreatic ductal dilatation or surrounding inflammatory changes. Spleen: Normal in size without focal abnormality. Adrenals/Urinary Tract: Adrenal glands are within normal limits. Enlarged renal collecting systems bilaterally and dilated  ureters, no distal stones are seen. The bladder is very distended. Stomach/Bowel: The stomach is nondilated. No dilated small bowel. The colon is collapsed but suspect that there is probably mild wall thickening/ colitis of the right colon, transverse colon, and splenic flexure. Appendix is within normal limits. Vascular/Lymphatic: No significant vascular findings are present. No enlarged abdominal or pelvic lymph nodes. Reproductive: Uterus and bilateral adnexa are unremarkable. Other: Tiny free fluid.  No free air. Musculoskeletal: No acute or significant osseous findings. IMPRESSION: 1. Suspect mild colitis  of the ascending colon, transverse colon and splenic flexure which may be secondary to infectious or inflammatory bowel disease. No evidence for an obstruction 2. Slightly enlarged bilateral renal collecting systems and ureters, this is suspected to be secondary to over distention of the urinary bladder which extends above the iliac crests. 3. Trace free fluid in the pelvis Electronically Signed   By: Donavan Foil M.D.   On: 05/18/2017 23:34    Procedures .Critical Care Performed by: Doristine Devoid Authorized by: Ocie Cornfield T   Critical care provider statement:    Critical care time (minutes):  45   Critical care was necessary to treat or prevent imminent or life-threatening deterioration of the following conditions:  Dehydration and metabolic crisis (DKA)   Critical care was time spent personally by me on the following activities:  Development of treatment plan with patient or surrogate, discussions with consultants, examination of patient, evaluation of patient's response to treatment, ordering and performing treatments and interventions, ordering and review of laboratory studies, ordering and review of radiographic studies, pulse oximetry, re-evaluation of patient's condition and review of old charts   (including critical care time)  Medications Ordered in ED Medications    insulin regular (NOVOLIN R,HUMULIN R) 100 Units in sodium chloride 0.9 % 100 mL (1 Units/mL) infusion (4.3 Units/hr Intravenous New Bag/Given 05/18/17 2305)  0.9 %  sodium chloride infusion (not administered)  dextrose 5 %-0.45 % sodium chloride infusion (not administered)  sodium chloride 0.9 % bolus 1,000 mL (1,000 mLs Intravenous New Bag/Given 05/18/17 2054)  metoCLOPramide (REGLAN) injection 10 mg (10 mg Intravenous Given 05/18/17 2055)  morphine 4 MG/ML injection 4 mg (4 mg Intravenous Given 05/18/17 2056)  famotidine (PEPCID) IVPB 20 mg premix (0 mg Intravenous Stopped 05/18/17 2135)  gi cocktail (Maalox,Lidocaine,Donnatal) (30 mLs Oral Given 05/18/17 2037)  sodium chloride 0.9 % bolus 1,000 mL (0 mLs Intravenous Stopped 05/18/17 2313)  sodium bicarbonate injection 50 mEq (50 mEq Intravenous Given 05/18/17 2219)  albuterol (PROVENTIL) (2.5 MG/3ML) 0.083% nebulizer solution 2.5 mg (2.5 mg Nebulization Given 05/18/17 2219)  lactated ringers bolus 1,000 mL (0 mLs Intravenous Stopped 05/19/17 0030)  calcium gluconate 2 g in sodium chloride 0.9 % 100 mL IVPB (2 g Intravenous New Bag/Given 05/18/17 2300)  sodium bicarbonate injection 50 mEq (50 mEq Intravenous Given 05/18/17 2300)  iopamidol (ISOVUE-300) 61 % injection (100 mLs  Contrast Given 05/18/17 2315)  piperacillin-tazobactam (ZOSYN) IVPB 3.375 g (0 g Intravenous Stopped 05/19/17 0130)     Initial Impression / Assessment and Plan / ED Course  I have reviewed the triage vital signs and the nursing notes.  Pertinent labs & imaging results that were available during my care of the patient were reviewed by me and considered in my medical decision making (see chart for details).     Patient presents to the ED past medical history significant for insulin-dependent type 1 diabetes for GI illness including vomiting, diarrhea, epigastric abdominal pain.  Patient has not taken her insulin for the past several days.  Patient with history of  DKA in the past.  Patient is tachycardic, tachypneic and afebrile on exam.    Patient is sleepy but responds to verbal stimuli slowly.  No focal neuro deficit.  Abdomen is mildly tender in the epigastric region but no signs of peritonitis.  Lungs are clear to auscultation bilaterally.  Labs revealed glucose of 501.  Potassium was 6.8.  PH was 7.01.  CO2 was undetectable. Anion gap not calculated.  Urine shows  80 ketones but no signs of infection.  Patient does have a leukocytosis of 18,000. Hemoglobin is 15.  Leukocytosis with likely degree of hemoconcentration however possibly elevated from abdominal pathology.  Given her significant tenderness on exam CT of abdomen was ordered.  EKG does show peaked T waves with sinus tachycardia noted.  She also has prolonged QT.  DKA protocol was initiated.  Given the significant hyperkalemia hyperkalemia protocol was initiated until the insulin can transition the potassium intracellularly.  Patient was given calcium gluconate, sodium bicarb, albuterol and Glucomander.  Patient was given a significant amount of IV fluids.  CT scan shows colitis which was treated with Zosyn given the prolonged QT would like to avoid Cipro Flagyl at this time.  Patient remains tachycardic with a heart rate of 140.  Blood pressures are stable.  Patient is afebrile.  Respirations have improved.  Patient was reevaluated and states that she is feeling improved.  Patient more alert and oriented at this time.  Repeat abdominal exam is benign.  No signs of peritonitis.  Spoke with Dr. Tamala Julian with hospital medicine for admission.  He would like for me to consult critical care.  Did speak with Dr. Elsworth Soho  with critical care who did not feel that they needed intervention at this time.  Did speak back with Dr. Tamala Julian who agrees to admission and will place admission orders.  Patient was seen by my attending Dr. Dayna Barker who is agreeable the above plan.  Patient was updated on plan of care.  Patient  currently remains hemodynamically stable.  .  Final Clinical Impressions(s) / ED Diagnoses   Final diagnoses:  Colitis  Diabetic ketoacidosis without coma associated with type 1 diabetes mellitus (HCC)  Prolonged QT interval    New Prescriptions New Prescriptions   No medications on file     Aaron Edelman 05/19/17 1324    Merrily Pew, MD 05/25/17 1037

## 2017-05-19 NOTE — Discharge Instructions (Signed)
Local Endocrinology practices:  [facility]: [phone] Dr. Philemon Kingdom Dr. Elayne Snare  [facility]:   [phone] Dr. Legrand [facility]: [phone]  Dr. Minette Brine  [facility] [phone] Dr. Lucilla Lame

## 2017-05-19 NOTE — Progress Notes (Signed)
Pharmacy Antibiotic Note  [name] is a 31 y.o. female admitted on 05/18/2017 with intra-abdominal.  Pharmacy has been consulted for zosyn dosing.  Plan:  Zosyn 3.375gm IV q8h over 4h infusion already ordered - continue as ordered  Pharmacy to sign off as do not anticipate need to renally adjust  Height: 5\' 6"  (167.6 cm) Weight: 141 lb 5 oz (64.1 kg) IBW/kg (Calculated) : 59.3  Temp (24hrs), Avg:99 F (37.2 C), Min:98.5 F (36.9 C), Max:100.3 F (37.9 C)   Recent Labs Lab 05/18/17 2054 05/19/17 0131 05/19/17 0502 05/19/17 0909  WBC 18.2*  --   --   --   CREATININE 1.48* 1.40* 1.16* 1.04*  LATICACIDVEN  --  2.3* 1.7  --     Estimated Creatinine Clearance: 74 mL/min (A) (by C-G formula based on SCr of 1.04 mg/dL (H)).    No Known Allergies  Antimicrobials this admission: 10/22 zosyn >>  Dose adjustments this admission:  Microbiology results: 10/23 Bcx 10/23 MRSA PCR: neg  Thank you for allowing pharmacy to be a part of this patient's care.  Doreene Eland, PharmD, BCPS.   Pager: [phone] 05/19/2017 2:42 PM

## 2017-05-19 NOTE — Plan of Care (Addendum)
Discuss case with Karle Barr, PA-C Ms. Seat is a 31 year old female with pmh IDDM who presented with complaints of N/V/D/abd pain. Afebrile, HR 123-150, respirations 15-36, blood pressures maintained, and O2 saturations 100% on RA. Labs revealed WBC 18.2, hemoglobin 15.1, sodium 131, potassium 6.8, chloride 101, CO2<7, BUN 20, currently 1.48, glucose 100. Venous pH was noted to be 7.016. CT scan of the abdomen reveals signs of colitis. Patient was initially noted to be somnolent,  but received initial 2 L of normal saline IV fluids, 50 mEq of sodium bicarbonate, and was started on insulin drip with improvement of mental status. Patient was started on Zosyn and for colitis. Critical care was consulted but recommended patient to [facility]. Bed to stepdown bed.

## 2017-05-19 NOTE — Progress Notes (Addendum)
Inpatient Diabetes Program Recommendations  AACE/ADA: New Consensus Statement on Inpatient Glycemic Control (2015)  Target Ranges:  Prepandial:   less than 140 mg/dL      Peak postprandial:   less than 180 mg/dL (1-2 hours)      Critically ill patients:  140 - 180 mg/dL   Results for [name], [name] (MRN [id number]) as of 05/19/2017 09:15  Ref. Range 05/18/2017 20:54  Sodium Latest Ref Range: 135 - 145 mmol/L 131 (L)  Potassium Latest Ref Range: 3.5 - 5.1 mmol/L 6.8 (HH)  Chloride Latest Ref Range: 101 - 111 mmol/L 101  CO2 Latest Ref Range: 22 - 32 mmol/L <7 (L)  Glucose Latest Ref Range: 65 - 99 mg/dL 501 (HH)  BUN Latest Ref Range: 6 - 20 mg/dL 20  Creatinine Latest Ref Range: 0.44 - 1.00 mg/dL 1.48 (H)  Calcium Latest Ref Range: 8.9 - 10.3 mg/dL 10.2  Anion gap Latest Ref Range: 5 - 15  NOT CALCULATED    Admit with: DKA  History: Type 1 DM  Home DM Meds: Levemir 20 units QHS       Novolog 0-20 units TID  Current Insulin Orders: IV Insulin drip        -Note patient started on IV Insulin drip yesterday at 11pm.  -5am BMET this morning reveals Anion Gap still elevated to 16 and CO2 still low at 9.  -Not ready to transition off the IV Insulin drip yet.  Next BMET results still pending.   MD- Please consider placing orders for current Hemoglobin A1c level.  Last one on file was back in January 2018.     --Will follow patient during hospitalization--  Wyn Quaker RN, MSN, CDE Diabetes Coordinator Inpatient Glycemic Control Team Team Pager: [phone] (8a-5p)

## 2017-05-19 NOTE — H&P (Signed)
[facility]                                                                                    Patient Demographics  [name], is a 31 y.o. female  CSN: [id number]  MRN: [id number]  DOB - [birth year]  Admit Date - 05/18/2017  Outpatient Primary MD for the patient is [name], FNP   With History of -  Past Medical History:  Diagnosis Date  . Diabetes mellitus without complication ([place])       History reviewed. No pertinent surgical history.  in for   Chief Complaint  Patient presents with  . Emesis  . Diarrhea  . Hyperglycemia     HPI  [name]  is a 31 y.o. female, with history of diabetes mellitus type 1 on Levemir who forgot to take her Levemir last night and woke up at 5 AM with intractable nausea vomiting and abdominal pain. No fever or chills. Labs in the emergency room showed DKA and CT of the abdomen showed ascending colitis. Patient was started on insulin drip and IV Zosyn for her colitis.    Review of Systems    In addition to the HPI above,  No Fever-chills, No Headache, No changes with Vision or hearing, No problems swallowing food or Liquids, No Chest pain, Cough or Shortness of Breath, No Abdominal pain, No Nausea or Vommitting, Bowel movements are regular, No Blood in stool or Urine, No dysuria, No new skin rashes or bruises, No new joints pains-aches,  No new weakness, tingling, numbness in any extremity, No recent weight gain or loss, No polyuria, polydypsia or polyphagia, No significant Mental Stressors.  A full 10 point Review of Systems was done, except as stated above, all other Review of Systems were negative.   Social History Social History  Substance Use Topics  . Smoking status: Never Smoker  . Smokeless tobacco: Never Used  . Alcohol use Yes     Comment: occasionally      Family History Family History  Problem Relation Age of Onset  . Throat cancer Mother   . Diabetes Maternal Aunt       Prior to Admission medications   Medication Sig Start Date End Date Taking? Authorizing Provider  cholecalciferol (VITAMIN D) 1000 units tablet Take 1,000 Units by mouth daily.   Yes [provider]  insulin aspart (NOVOLOG) 100 UNIT/ML injection Inject 0-20 Units into the skin 3 (three) times daily before meals. 08/07/16  Yes [name], FNP  insulin detemir (LEVEMIR) 100 unit/ml SOLN Inject 0.2 mLs (20 Units total) into the skin at bedtime. 08/07/16  Yes [name], FNP  loratadine (CLARITIN) 10 MG tablet Take 10 mg by mouth daily.   Yes [provider]  Probiotic Product (PROBIOTIC DAILY PO) Take by mouth.   Yes [provider]  vitamin C (ASCORBIC ACID) 500 MG tablet Take 500 mg by mouth daily.   Yes [provider]  Blood Glucose Monitoring Suppl (TRUE METRIX AIR GLUCOSE METER) DEVI 1 each by Does not apply route 4 (four) times daily -  with meals and at bedtime. 07/25/15   Smith Robert,  Asencion Partridge, FNP  glucose blood (TRUE METRIX BLOOD GLUCOSE TEST) test strip Use as instructed 07/25/15   [name], FNP  hydroquinone 4 % cream Apply topically 2 (two) times daily. Patient not taking: Reported on 05/18/2017 09/30/16   [name], FNP  INSULIN SYRINGE .5CC/29G 29G X 1/2" 0.5 ML MISC 1 each by Does not apply route 4 (four) times daily - after meals and at bedtime. 07/25/15   [name], FNP  Lancet Device MISC 1 each by Does not apply route 4 (four) times daily - after meals and at bedtime. 07/25/15   [name], FNP    No Known Allergies  Physical Exam  Vitals  Blood pressure 115/62, pulse (!) 114, temperature 98.6 F (37 C), temperature source Oral, resp. rate 16, height 5\' 6"  (1.676 m), weight 64.1 kg (141 lb 5 oz), last menstrual period 04/18/2017, SpO2 100 %.   1. General Young female looks tired and sleepy. Well-nourished, well-developed  2. Normal affect and insight, Not Suicidal or Homicidal, Awake  Alert, Oriented X 3.  3. No F.N deficits, grossly, moving all extremities.  4. Ears and Eyes appear Normal, Conjunctivae clear, PERRLA. Moist Oral Mucosa.  5. Supple Neck, No JVD, No cervical lymphadenopathy appriciated, No Carotid Bruits.  6. Symmetrical Chest wall movement, Good air movement bilaterally, CTAB.  7. RRR, No Gallops, Rubs or Murmurs, No Parasternal Heave.  8. Positive Bowel Sounds, Abdomen Soft, Non tender, No organomegaly appriciated,No rebound -guarding or rigidity.  9.  No Cyanosis, Normal Skin Turgor, No Skin Rash or Bruise.  10. Good muscle tone,  joints appear normal , no effusions, Normal ROM.    Data Review  CBC  Recent Labs Lab 05/18/17 2054  WBC 18.2*  HGB 15.1*  HCT 46.0  PLT 382  MCV 88.1  MCH 28.9  MCHC 32.8  RDW 13.8   ------------------------------------------------------------------------------------------------------------------  Chemistries   Recent Labs Lab 05/18/17 2054 05/19/17 0131 05/19/17 0502 05/19/17 0909  NA 131* 137 138 135  K 6.8* 5.3* 4.4 4.3  CL 101 110 113* 113*  CO2 <7* <7* 9* 11*  GLUCOSE 501* 293* 153* 177*  BUN 20 16 14 12   CREATININE 1.48* 1.40* 1.16* 1.04*  CALCIUM 10.2 9.3 9.4 9.1  AST 51*  --   --   --   ALT 32  --   --   --   ALKPHOS 86  --   --   --   BILITOT 1.8*  --   --   --    ------------------------------------------------------------------------------------------------------------------ estimated creatinine clearance is 74 mL/min (A) (by C-G formula based on SCr of 1.04 mg/dL (H)). ------------------------------------------------------------------------------------------------------------------ No results for input(s): TSH, T4TOTAL, T3FREE, THYROIDAB in the last 72 hours.  Invalid input(s): FREET3   Coagulation profile No results for input(s): INR, PROTIME in the last 168  hours. ------------------------------------------------------------------------------------------------------------------- No results for input(s): DDIMER in the last 72 hours. -------------------------------------------------------------------------------------------------------------------  Cardiac Enzymes No results for input(s): CKMB, TROPONINI, MYOGLOBIN in the last 168 hours.  Invalid input(s): CK ------------------------------------------------------------------------------------------------------------------ Invalid input(s): POCBNP   ---------------------------------------------------------------------------------------------------------------  Urinalysis    Component Value Date/Time   COLORURINE STRAW (A) 05/18/2017 1859   APPEARANCEUR CLEAR 05/18/2017 1859   LABSPEC 1.020 05/18/2017 1859   PHURINE 5.0 05/18/2017 1859   GLUCOSEU >=500 (A) 05/18/2017 1859   HGBUR SMALL (A) 05/18/2017 1859   BILIRUBINUR NEGATIVE 05/18/2017 1859   KETONESUR 80 (A) 05/18/2017 1859   PROTEINUR 100 (A) 05/18/2017 1859   UROBILINOGEN 0.2 08/07/2016  [place] 05/18/2017 1859   LEUKOCYTESUR NEGATIVE 05/18/2017 1859    ----------------------------------------------------------------------------------------------------------------   Imaging results:   Ct Abdomen Pelvis W Contrast  Result Date: 05/18/2017 CLINICAL DATA:  Abdominal pain with nausea vomiting and diarrhea EXAM: CT ABDOMEN AND PELVIS WITH CONTRAST TECHNIQUE: Multidetector CT imaging of the abdomen and pelvis was performed using the standard protocol following bolus administration of intravenous contrast. CONTRAST:  120mL ISOVUE-300 IOPAMIDOL (ISOVUE-300) INJECTION 61% COMPARISON:  04/11/2014 FINDINGS: Lower chest: No acute abnormality. Hepatobiliary: No focal liver abnormality is seen. No gallstones, gallbladder wall thickening, or biliary dilatation. Pancreas: Unremarkable. No pancreatic ductal dilatation or  surrounding inflammatory changes. Spleen: Normal in size without focal abnormality. Adrenals/Urinary Tract: Adrenal glands are within normal limits. Enlarged renal collecting systems bilaterally and dilated ureters, no distal stones are seen. The bladder is very distended. Stomach/Bowel: The stomach is nondilated. No dilated small bowel. The colon is collapsed but suspect that there is probably mild wall thickening/ colitis of the right colon, transverse colon, and splenic flexure. Appendix is within normal limits. Vascular/Lymphatic: No significant vascular findings are present. No enlarged abdominal or pelvic lymph nodes. Reproductive: Uterus and bilateral adnexa are unremarkable. Other: Tiny free fluid.  No free air. Musculoskeletal: No acute or significant osseous findings. IMPRESSION: 1. Suspect mild colitis of the ascending colon, transverse colon and splenic flexure which may be secondary to infectious or inflammatory bowel disease. No evidence for an obstruction 2. Slightly enlarged bilateral renal collecting systems and ureters, this is suspected to be secondary to over distention of the urinary bladder which extends above the iliac crests. 3. Trace free fluid in the pelvis Electronically Signed   By: Donavan Foil M.D.   On: 05/18/2017 23:34      Assessment & Plan  1. DKA     Started on IV insulin per protocol 2. Colitis     Continue with IV Zosyn   DVT Prophylaxis Lovenox  AM Labs Ordered, also please review Full Orders  Code Status full  Disposition Plan: Home  Time spent in minutes : 43 minutes  Condition GUARDED   @SIGNATURE @

## 2017-05-20 DIAGNOSIS — E1010 Type 1 diabetes mellitus with ketoacidosis without coma: Principal | ICD-10-CM

## 2017-05-20 DIAGNOSIS — K529 Noninfective gastroenteritis and colitis, unspecified: Secondary | ICD-10-CM

## 2017-05-20 LAB — BASIC METABOLIC PANEL WITH GFR
Anion gap: 8 (ref 5–15)
BUN: 9 mg/dL (ref 6–20)
CO2: 17 mmol/L — ABNORMAL LOW (ref 22–32)
Calcium: 8.7 mg/dL — ABNORMAL LOW (ref 8.9–10.3)
Chloride: 112 mmol/L — ABNORMAL HIGH (ref 101–111)
Creatinine, Ser: 0.64 mg/dL (ref 0.44–1.00)
GFR calc Af Amer: >60 mL/min
GFR calc non Af Amer: >60 mL/min
Glucose, Bld: 70 mg/dL (ref 65–99)
Potassium: 3.3 mmol/L — ABNORMAL LOW (ref 3.5–5.1)
Sodium: 137 mmol/L (ref 135–145)

## 2017-05-20 LAB — GLUCOSE, CAPILLARY
Glucose-Capillary: 106 mg/dL — ABNORMAL HIGH (ref 65–99)
Glucose-Capillary: 121 mg/dL — ABNORMAL HIGH (ref 65–99)
Glucose-Capillary: 134 mg/dL — ABNORMAL HIGH (ref 65–99)
Glucose-Capillary: 224 mg/dL — ABNORMAL HIGH (ref 65–99)
Glucose-Capillary: 327 mg/dL — ABNORMAL HIGH (ref 65–99)
Glucose-Capillary: 74 mg/dL (ref 65–99)

## 2017-05-20 MED ORDER — AMOXICILLIN-POT CLAVULANATE 875-125 MG PO TABS: 1.0000 | ORAL_TABLET | Freq: Two times a day (BID) | ORAL | 0 refills | Status: AC

## 2017-05-20 MED ORDER — INSULIN ASPART 100 UNIT/ML ~~LOC~~ SOLN
0.0000 [IU] | SUBCUTANEOUS | Status: DC
  Administered 2017-05-20: 7 [IU] via SUBCUTANEOUS
  Administered 2017-05-20: 1 [IU] via SUBCUTANEOUS
  Administered 2017-05-20: 3 [IU] via SUBCUTANEOUS

## 2017-05-20 MED ORDER — POTASSIUM CHLORIDE CRYS ER 20 MEQ PO TBCR
40.0000 meq | EXTENDED_RELEASE_TABLET | Freq: Two times a day (BID) | ORAL | Status: DC
  Administered 2017-05-20: 40 meq via ORAL
  Filled 2017-05-20: qty 2

## 2017-05-20 NOTE — Care Management Note (Signed)
Case Management Note  Patient Details  Name: [name] MRN: [id number] Date of Birth: 10/26/1985  Subjective/Objective:                  dka  Action/Plan: Date:  May 20 2017 Chart reviewed for concurrent status and case management needs.  Will continue to follow patient progress.  Discharge Planning: following for needs  Expected discharge date: May 23, 2017  Velva Harman, BSN, [place], [place]   Expected Discharge Date:                  Expected Discharge Plan:  Home/Self Care  In-House Referral:     Discharge planning Services  CM Consult  Post Acute Care Choice:    Choice offered to:     DME Arranged:    DME Agency:     HH Arranged:    HH Agency:     Status of Service:  In process, will continue to follow  If discussed at Long Length of Stay Meetings, dates discussed:    Additional Comments:  Leeroy Cha, RN 05/20/2017, 9:50 AM

## 2017-05-20 NOTE — Progress Notes (Signed)
Patient discharged to home, denies needs and wants. States understands discharge instructions.

## 2017-05-21 LAB — CBC
HCT: 34.6 % — ABNORMAL LOW (ref 36.0–46.0)
Hemoglobin: 11.9 g/dL — ABNORMAL LOW (ref 12.0–15.0)
MCH: 28.3 pg (ref 26.0–34.0)
MCHC: 34.4 g/dL (ref 30.0–36.0)
MCV: 82.2 fL (ref 78.0–100.0)
Platelets: 267 10*3/uL (ref 150–400)
RBC: 4.21 MIL/uL (ref 3.87–5.11)
RDW: 14.1 % (ref 11.5–15.5)
WBC: 7.8 10*3/uL (ref 4.0–10.5)

## 2017-05-21 NOTE — Discharge Summary (Signed)
Physician Discharge Summary  [name] KNL:[id number] DOB: 1985/12/24 DOA: 05/18/2017  PCP: [name], FNP  Admit date: 05/18/2017 Discharge date: 05/20/2017  Admitted From: Home.  Disposition: Home.   Recommendations for Outpatient Follow-up:  1. Follow up with PCP in 1-2 weeks 2. Please obtain BMP/CBC in one week   Discharge Condition:stable.  CODE STATUS: full code.  Diet recommendation: Heart Healthy   Brief/Interim Summary:  [name]  is a 31 y.o. female, with history of diabetes mellitus type 1 on Levemir who forgot to take her Levemir the night before admission and woke up at 5 AM with intractable nausea vomiting and abdominal pain. No fever or chills. Labs in the emergency room showed DKA and CT of the abdomen showed ascending colitis. Patient was started on insulin drip and IV Zosyn for her colitis. Discharge Diagnoses:  Active Problems:   DKA (diabetic ketoacidoses) ([facility])   DKA, type 1 ([facility])   Colitis   Prolonged QT interval    DKA; Resolved.  Changed insulin gtt to her home regimen of levemir.  Resume SSI.  repleted electrolytes.    Leukocytosis possibly from colitis . Resolved.    Mild normocytic anemia:  Stable.   Colitis:  Pt denies any nausea, vomiting, abd pain or diarrhea.  She was started on zosyn, clears and advanced as tolerated.  She is being discharged on oral augmentin to complete the course.    Discharge Instructions  Discharge Instructions    Diet Carb Modified    Complete by:  As directed      Allergies as of 05/20/2017   No Known Allergies     Medication List    STOP taking these medications   hydroquinone 4 % cream     TAKE these medications   amoxicillin-clavulanate 875-125 MG tablet Commonly known as:  AUGMENTIN Take 1 tablet by mouth every 12 (twelve) hours.   cholecalciferol 1000 units tablet Commonly known as:  VITAMIN D Take 1,000 Units by mouth daily.   glucose blood test strip Commonly  known as:  TRUE METRIX BLOOD GLUCOSE TEST Use as instructed   insulin aspart 100 UNIT/ML injection Commonly known as:  novoLOG Inject 0-20 Units into the skin 3 (three) times daily before meals.   insulin detemir 100 unit/ml Soln Commonly known as:  LEVEMIR Inject 0.2 mLs (20 Units total) into the skin at bedtime.   INSULIN SYRINGE .5CC/29G 29G X 1/2" 0.5 ML Misc 1 each by Does not apply route 4 (four) times daily - after meals and at bedtime.   Lancet Device Misc 1 each by Does not apply route 4 (four) times daily - after meals and at bedtime.   loratadine 10 MG tablet Commonly known as:  CLARITIN Take 10 mg by mouth daily.   PROBIOTIC DAILY PO Take by mouth.   TRUE METRIX AIR GLUCOSE METER Devi 1 each by Does not apply route 4 (four) times daily -  with meals and at bedtime.   vitamin C 500 MG tablet Commonly known as:  ASCORBIC ACID Take 500 mg by mouth daily.      Follow-up Information    [name], FNP. Schedule an appointment as soon as possible for a visit in 1 week(s).   Specialty:  Family Medicine Contact information: [place]. [place] [address] [phone]          No Known Allergies  Consultations:  None.    Procedures/Studies: Ct Abdomen Pelvis W Contrast  Result Date: 05/18/2017 CLINICAL DATA:  Abdominal pain with nausea vomiting and diarrhea EXAM: CT ABDOMEN AND PELVIS WITH CONTRAST TECHNIQUE: Multidetector CT imaging of the abdomen and pelvis was performed using the standard protocol following bolus administration of intravenous contrast. CONTRAST:  158mL ISOVUE-300 IOPAMIDOL (ISOVUE-300) INJECTION 61% COMPARISON:  04/11/2014 FINDINGS: Lower chest: No acute abnormality. Hepatobiliary: No focal liver abnormality is seen. No gallstones, gallbladder wall thickening, or biliary dilatation. Pancreas: Unremarkable. No pancreatic ductal dilatation or surrounding inflammatory changes. Spleen: Normal in size without focal  abnormality. Adrenals/Urinary Tract: Adrenal glands are within normal limits. Enlarged renal collecting systems bilaterally and dilated ureters, no distal stones are seen. The bladder is very distended. Stomach/Bowel: The stomach is nondilated. No dilated small bowel. The colon is collapsed but suspect that there is probably mild wall thickening/ colitis of the right colon, transverse colon, and splenic flexure. Appendix is within normal limits. Vascular/Lymphatic: No significant vascular findings are present. No enlarged abdominal or pelvic lymph nodes. Reproductive: Uterus and bilateral adnexa are unremarkable. Other: Tiny free fluid.  No free air. Musculoskeletal: No acute or significant osseous findings. IMPRESSION: 1. Suspect mild colitis of the ascending colon, transverse colon and splenic flexure which may be secondary to infectious or inflammatory bowel disease. No evidence for an obstruction 2. Slightly enlarged bilateral renal collecting systems and ureters, this is suspected to be secondary to over distention of the urinary bladder which extends above the iliac crests. 3. Trace free fluid in the pelvis Electronically Signed   By: Donavan Foil M.D.   On: 05/18/2017 23:34       Subjective: No abd pain, chills or nausea, vomiting.   Discharge Exam: Vitals:   05/20/17 0800 05/20/17 1500  BP:  132/85  Pulse:    Resp:  18  Temp: 98.4 F (36.9 C)   SpO2:     Vitals:   05/20/17 0320 05/20/17 0335 05/20/17 0800 05/20/17 1500  BP:  114/74  132/85  Pulse:      Resp:  15  18  Temp: 98.6 F (37 C)  98.4 F (36.9 C)   TempSrc: Oral  Oral   SpO2:  100%    Weight:      Height:        General: Pt is alert, awake, not in acute distress Cardiovascular: RRR, S1/S2 +, no rubs, no gallops Respiratory: CTA bilaterally, no wheezing, no rhonchi Abdominal: Soft, NT, ND, bowel sounds + Extremities: no edema, no cyanosis    The results of significant diagnostics from this hospitalization  (including imaging, microbiology, ancillary and laboratory) are listed below for reference.     Microbiology: Recent Results (from the past 240 hour(s))  Culture, blood (routine x 2)     Status: None (Preliminary result)   Collection Time: 05/19/17  1:26 AM  Result Value Ref Range Status   Specimen Description BLOOD LEFT ANTECUBITAL  Final   Special Requests   Final    BOTTLES DRAWN AEROBIC AND ANAEROBIC Blood Culture adequate volume   Culture   Final    NO GROWTH 1 DAY Performed at [facility], [address], [place], [place] [address]    Report Status PENDING  Incomplete  Culture, blood (routine x 2)     Status: None (Preliminary result)   Collection Time: 05/19/17  1:31 AM  Result Value Ref Range Status   Specimen Description BLOOD RIGHT ANTECUBITAL  Final   Special Requests   Final    BOTTLES DRAWN AEROBIC AND ANAEROBIC Blood  Culture adequate volume   Culture   Final    NO GROWTH 1 DAY Performed at [facility], [place] [address] [place], [place] [address]    Report Status PENDING  Incomplete  MRSA PCR Screening     Status: None   Collection Time: 05/19/17  3:20 AM  Result Value Ref Range Status   MRSA by PCR NEGATIVE NEGATIVE Final    Comment:        The GeneXpert MRSA Assay (FDA approved for NASAL specimens only), is one component of a comprehensive MRSA colonization surveillance program. It is not intended to diagnose MRSA infection nor to guide or monitor treatment for MRSA infections.      Labs: BNP (last 3 results) No results for input(s): BNP in the last 8760 hours. Basic Metabolic Panel:  Recent Labs Lab 05/19/17 0502 05/19/17 0909 05/19/17 1435 05/19/17 1846 05/20/17 0351  NA 138 135 135 135 137  K 4.4 4.3 3.8 3.8 3.3*  CL 113* 113* 111 111 112*  CO2 9* 11* 13* 16* 17*  GLUCOSE 153* 177* 222* 207* 70  BUN 14 12 11 11 9   CREATININE 1.16* 1.04* 0.98 0.81 0.64  CALCIUM 9.4 9.1 9.1 9.0 8.7*   Liver Function Tests:  Recent  Labs Lab 05/18/17 2054  AST 51*  ALT 32  ALKPHOS 86  BILITOT 1.8*  PROT 9.7*  ALBUMIN 5.3*    Recent Labs Lab 05/18/17 2054  LIPASE 16   No results for input(s): AMMONIA in the last 168 hours. CBC:  Recent Labs Lab 05/18/17 2054 05/20/17 0351  WBC 18.2* 7.8  HGB 15.1* 11.9*  HCT 46.0 34.6*  MCV 88.1 82.2  PLT 382 267   Cardiac Enzymes: No results for input(s): CKTOTAL, CKMB, CKMBINDEX, TROPONINI in the last 168 hours. BNP: Invalid input(s): POCBNP CBG:  Recent Labs Lab 05/20/17 0320 05/20/17 0807 05/20/17 1038 05/20/17 1212 05/20/17 1637  GLUCAP 74 121* 224* 134* 327*   D-Dimer No results for input(s): DDIMER in the last 72 hours. Hgb A1c No results for input(s): HGBA1C in the last 72 hours. Lipid Profile No results for input(s): CHOL, HDL, LDLCALC, TRIG, CHOLHDL, LDLDIRECT in the last 72 hours. Thyroid function studies No results for input(s): TSH, T4TOTAL, T3FREE, THYROIDAB in the last 72 hours.  Invalid input(s): FREET3 Anemia work up No results for input(s): VITAMINB12, FOLATE, FERRITIN, TIBC, IRON, RETICCTPCT in the last 72 hours. Urinalysis    Component Value Date/Time   COLORURINE STRAW (A) 05/18/2017 1859   APPEARANCEUR CLEAR 05/18/2017 1859   LABSPEC 1.020 05/18/2017 1859   PHURINE 5.0 05/18/2017 1859   GLUCOSEU >=500 (A) 05/18/2017 1859   HGBUR SMALL (A) 05/18/2017 1859   BILIRUBINUR NEGATIVE 05/18/2017 1859   KETONESUR 80 (A) 05/18/2017 1859   PROTEINUR 100 (A) 05/18/2017 1859   UROBILINOGEN 0.2 08/07/2016 1610   NITRITE NEGATIVE 05/18/2017 1859   LEUKOCYTESUR NEGATIVE 05/18/2017 1859   Sepsis Labs Invalid input(s): PROCALCITONIN,  WBC,  LACTICIDVEN Microbiology Recent Results (from the past 240 hour(s))  Culture, blood (routine x 2)     Status: None (Preliminary result)   Collection Time: 05/19/17  1:26 AM  Result Value Ref Range Status   Specimen Description BLOOD LEFT ANTECUBITAL  Final   Special Requests   Final     BOTTLES DRAWN AEROBIC AND ANAEROBIC Blood Culture adequate volume   Culture   Final    NO GROWTH 1 DAY Performed at [facility], [place] [address] [place], [place]  [address]    Report Status PENDING  Incomplete  Culture, blood (routine x 2)     Status: None (Preliminary result)   Collection Time: 05/19/17  1:31 AM  Result Value Ref Range Status   Specimen Description BLOOD RIGHT ANTECUBITAL  Final   Special Requests   Final    BOTTLES DRAWN AEROBIC AND ANAEROBIC Blood Culture adequate volume   Culture   Final    NO GROWTH 1 DAY Performed at [facility], [place] [address] [place], [place] [address]    Report Status PENDING  Incomplete  MRSA PCR Screening     Status: None   Collection Time: 05/19/17  3:20 AM  Result Value Ref Range Status   MRSA by PCR NEGATIVE NEGATIVE Final    Comment:        The GeneXpert MRSA Assay (FDA approved for NASAL specimens only), is one component of a comprehensive MRSA colonization surveillance program. It is not intended to diagnose MRSA infection nor to guide or monitor treatment for MRSA infections.      Time coordinating discharge: Over 30 minutes  SIGNED:   Hosie Poisson, MD  [facility] 05/21/2017, 8:45 AM Pager   If 7PM-7AM, please contact night-coverage [email or web address] Password [email or web address]

## 2017-05-24 LAB — CULTURE, BLOOD (ROUTINE X 2)
Culture: NO GROWTH
Culture: NO GROWTH
Special Requests: ADEQUATE
Special Requests: ADEQUATE

## 2017-06-01 ENCOUNTER — Emergency Department (HOSPITAL_COMMUNITY): Payer: No Typology Code available for payment source

## 2017-06-01 ENCOUNTER — Emergency Department (HOSPITAL_COMMUNITY)
Admission: EM | Admit: 2017-06-01 | Discharge: 2017-06-01 | Disposition: A | Discharge status: Home / Self Care | Payer: No Typology Code available for payment source | Attending: Emergency Medicine | Admitting: Emergency Medicine

## 2017-06-01 ENCOUNTER — Encounter (HOSPITAL_COMMUNITY): Payer: Self-pay

## 2017-06-01 DIAGNOSIS — E1065 Type 1 diabetes mellitus with hyperglycemia: Secondary | ICD-10-CM | POA: Insufficient documentation

## 2017-06-01 DIAGNOSIS — R232 Flushing: Secondary | ICD-10-CM | POA: Insufficient documentation

## 2017-06-01 DIAGNOSIS — Z79899 Other long term (current) drug therapy: Secondary | ICD-10-CM | POA: Insufficient documentation

## 2017-06-01 LAB — BASIC METABOLIC PANEL
Anion gap: 12 (ref 5–15)
BUN: 16 mg/dL (ref 6–20)
CO2: 22 mmol/L (ref 22–32)
Calcium: 9.2 mg/dL (ref 8.9–10.3)
Chloride: 97 mmol/L — ABNORMAL LOW (ref 101–111)
Creatinine, Ser: 0.93 mg/dL (ref 0.44–1.00)
GFR calc Af Amer: >60 mL/min (ref >60)
GFR calc non Af Amer: >60 mL/min (ref >60)
Glucose, Bld: 378 mg/dL — ABNORMAL HIGH (ref 65–99)
Potassium: 4 mmol/L (ref 3.5–5.1)
Sodium: 131 mmol/L — ABNORMAL LOW (ref 135–145)

## 2017-06-01 LAB — CBC
HCT: 40.3 % (ref 36.0–46.0)
Hemoglobin: 13.2 g/dL (ref 12.0–15.0)
MCH: 27.9 pg (ref 26.0–34.0)
MCHC: 32.8 g/dL (ref 30.0–36.0)
MCV: 85.2 fL (ref 78.0–100.0)
Platelets: 417 10*3/uL — ABNORMAL HIGH (ref 150–400)
RBC: 4.73 MIL/uL (ref 3.87–5.11)
RDW: 13.5 % (ref 11.5–15.5)
WBC: 5.2 10*3/uL (ref 4.0–10.5)

## 2017-06-01 NOTE — ED Provider Notes (Signed)
[facility] EMERGENCY DEPARTMENT Provider Note   CSN: [id number] Arrival date & time: 06/01/17  1030     History   Chief Complaint Chief Complaint  Patient presents with  . Shortness of Breath    HPI [name] is a 31 y.o. female.  HPI Patient reports she was hospitalized about 2 weeks ago.  She reports she has been compliant with her insulin taking 20 units of Levemir at night and using sliding scale.  She reports nonetheless her blood sugars are frequently up to the 300s.  She reports she is calculating her carbohydrate and matching with her insulin.  She reports that she will get episodes of hot flashes feel kind of short of breath.  These will come and go unexpectedly.  No documented fever.  No vomiting.  He does report that she urinates frequently.  But no pain or burning.  Patient was concerned to make sure she was not developing DKA as it comes on fairly suddenly. Past Medical History:  Diagnosis Date  . Diabetes mellitus without complication [facility])     Patient Active Problem List   Diagnosis Date Noted  . DKA, type 1 ([place]) 05/19/2017  . Colitis   . Prolonged QT interval   . Hyperpigmentation of skin, postinflammatory 08/07/2016  . Dizziness 04/25/2016  . DKA (diabetic ketoacidoses) ([place]) 04/25/2016  . Rhinitis, allergic 10/11/2015  . Diabetes type 1, uncontrolled ([place]) 07/25/2015  . Cephalalgia 07/25/2015  . Generalized anxiety disorder 07/25/2015  . Depression 07/25/2015  . Diabetic ketoacidosis without coma associated with type 1 diabetes mellitus ([place]) 07/11/2012  . Leukocytosis 07/11/2012  . Hyperkalemia 07/11/2012  . High anion gap metabolic acidosis 32/20/2542    History reviewed. No pertinent surgical history.  OB History    No data available       Home Medications    Prior to Admission medications   Medication Sig Start Date End Date Taking? Authorizing Provider  Blood Glucose Monitoring Suppl (TRUE METRIX AIR GLUCOSE  METER) DEVI 1 each by Does not apply route 4 (four) times daily -  with meals and at bedtime. 07/25/15   [name], FNP  cholecalciferol (VITAMIN D) 1000 units tablet Take 1,000 Units by mouth daily.    [provider]  glucose blood (TRUE METRIX BLOOD GLUCOSE TEST) test strip Use as instructed 07/25/15   [name], FNP  insulin aspart (NOVOLOG) 100 UNIT/ML injection Inject 0-20 Units into the skin 3 (three) times daily before meals. 08/07/16   [name], FNP  insulin detemir (LEVEMIR) 100 unit/ml SOLN Inject 0.2 mLs (20 Units total) into the skin at bedtime. 08/07/16   [name], FNP  INSULIN SYRINGE .5CC/29G 29G X 1/2" 0.5 ML MISC 1 each by Does not apply route 4 (four) times daily - after meals and at bedtime. 07/25/15   [name], FNP  Lancet Device MISC 1 each by Does not apply route 4 (four) times daily - after meals and at bedtime. 07/25/15   [name], FNP  loratadine (CLARITIN) 10 MG tablet Take 10 mg by mouth daily.    [provider]  Probiotic Product (PROBIOTIC DAILY PO) Take by mouth.    [provider]  vitamin C (ASCORBIC ACID) 500 MG tablet Take 500 mg by mouth daily.    [provider]    Family History Family History  Problem Relation Age of Onset  . Throat cancer Mother   . Diabetes Maternal Aunt  Social History Social History   Tobacco Use  . Smoking status: Never Smoker  . Smokeless tobacco: Never Used  Substance Use Topics  . Alcohol use: Yes    Comment: occasionally   . Drug use: No     Allergies   Patient has no known allergies.   Review of Systems Review of Systems 10 Systems reviewed and are negative for acute change except as noted in the HPI.   Physical Exam Updated Vital Signs BP 136/87 (BP Location: Right Arm)   Pulse 81   Temp 98 F (36.7 C) (Oral)   Resp 14   Ht 5\' 6"  (1.676 m)   Wt 64 kg (141 lb)   LMP 05/18/2017 (Within Days)   SpO2 100%   BMI  22.76 kg/m   Physical Exam  Constitutional: She is oriented to person, place, and time. She appears well-developed and well-nourished. No distress.  HENT:  Head: Normocephalic and atraumatic.  Mouth/Throat: Oropharynx is clear and moist.  Eyes: Conjunctivae and EOM are normal.  Neck: Neck supple.  Cardiovascular: Normal rate, regular rhythm, normal heart sounds and intact distal pulses.  No murmur heard. Pulmonary/Chest: Effort normal and breath sounds normal. No respiratory distress.  Abdominal: Soft. Bowel sounds are normal. She exhibits no distension and no mass. There is no tenderness. There is no guarding.  Musculoskeletal: Normal range of motion. She exhibits no edema or tenderness.  Neurological: She is alert and oriented to person, place, and time. No cranial nerve deficit. She exhibits normal muscle tone. Coordination normal.  Skin: Skin is warm and dry.  Psychiatric: She has a normal mood and affect.  Nursing note and vitals reviewed.    ED Treatments / Results  Labs (all labs ordered are listed, but only abnormal results are displayed) Labs Reviewed  BASIC METABOLIC PANEL - Abnormal; Notable for the following components:      Result Value   Sodium 131 (*)    Chloride 97 (*)    Glucose, Bld 378 (*)    All other components within normal limits  CBC - Abnormal; Notable for the following components:   Platelets 417 (*)    All other components within normal limits    EKG  EKG Interpretation  Date/Time:  Monday June 01 2017 10:48:18 EST Ventricular Rate:  112 PR Interval:  142 QRS Duration: 64 QT Interval:  324 QTC Calculation: 442 R Axis:   57 Text Interpretation:  Sinus tachycardia Low voltage QRS Borderline ECG agree. otherwose normal Confirmed by Charlesetta Shanks [phone]) on 06/01/2017 3:00:13 PM       Radiology Dg Chest 2 View  Result Date: 06/01/2017 CLINICAL DATA:  Chest pain, shortness of breath, and visual changes. EXAM: CHEST  2 VIEW COMPARISON:   Chest radiograph 02/10/2013 and CT 04/11/2014 FINDINGS: The cardiomediastinal silhouette is within normal limits. The lungs are well inflated and clear. There is no evidence of pleural effusion or pneumothorax. No acute osseous abnormality is identified. IMPRESSION: No active cardiopulmonary disease. Electronically Signed   By: Logan Bores M.D.   On: 06/01/2017 11:21    Procedures Procedures (including critical care time)  Medications Ordered in ED Medications - No data to display   Initial Impression / Assessment and Plan / ED Course  I have reviewed the triage vital signs and the nursing notes.  Pertinent labs & imaging results that were available during my care of the patient were reviewed by me and considered in my medical decision making (see chart for details).  Final Clinical Impressions(s) / ED Diagnoses   Final diagnoses:  Hyperglycemia due to type 1 diabetes mellitus ([place])  Hot flashes  This time, patient does not have signs of DKA.  She has been hyperglycemic on her home regimen.  She reports she is compliant with dietary measures.  No signs of acute infection.  Patient is alert and nontoxic.  No respiratory distress.  Lungs are clear.  Chest x-ray clear.  No signs of pneumonia.  At this time, plan will be to increase evening Levemir to 22 units.  Patient will monitor blood sugars closely for response.  She is advised to follow-up as soon as possible with her PCP.  She reports she cannot see her endocrinologist at this time due to loss of insurance.  Return precautions reviewed.  ED Discharge Orders    None       Charlesetta Shanks, MD 06/01/17 1520

## 2017-06-01 NOTE — ED Notes (Signed)
Pt verbalized understanding of d/c instructions. Pt ambulatory to lobby with steady gait. All belongings went home with pt. VSS

## 2017-06-01 NOTE — ED Notes (Signed)
Blue top sent to main lab

## 2017-06-01 NOTE — Discharge Instructions (Signed)
1.  Increase your nighttime Levemir dose to 22 units.  Measure your blood sugar 3 times daily for response to this change.  Decrease your evening sliding scale for the first 2 days to make sure you do not get hypoglycemia during the night. 2.  See your family doctor and/or endocrinologist as soon as possible.

## 2017-06-01 NOTE — ED Triage Notes (Signed)
Pt arrived via POV c/o SOB with exertion over the last few days.  Recently admitted with DKA.  Pt equal and unlabored in triage.

## 2017-06-01 NOTE — ED Notes (Signed)
Pt reports SOB x 2-3 days. Pt reports she was recently seen in the hospital for DKA and feels as though she might be in DKA today. Pt reports high sugars the past few days, polyuria, and polydipsia. Respirations even and unlabored.

## 2017-11-12 ENCOUNTER — Encounter (HOSPITAL_COMMUNITY): Payer: Self-pay | Admitting: Emergency Medicine

## 2017-11-12 ENCOUNTER — Other Ambulatory Visit: Payer: Self-pay

## 2017-11-12 ENCOUNTER — Ambulatory Visit (HOSPITAL_COMMUNITY)
Admission: EM | Admit: 2017-11-12 | Discharge: 2017-11-12 | Disposition: A | Discharge status: Home / Self Care | Payer: Self-pay | Attending: Family Medicine | Admitting: Family Medicine

## 2017-11-12 DIAGNOSIS — L03317 Cellulitis of buttock: Secondary | ICD-10-CM

## 2017-11-12 MED ORDER — FLUCONAZOLE 200 MG PO TABS: 200.0000 mg | ORAL_TABLET | Freq: Every day | ORAL | 0 refills | Status: AC

## 2017-11-12 MED ORDER — HYDROCODONE-ACETAMINOPHEN 5-325 MG PO TABS: 1.0000 | ORAL_TABLET | Freq: Four times a day (QID) | ORAL | 0 refills | Status: DC | PRN

## 2017-11-12 MED ORDER — DOXYCYCLINE HYCLATE 100 MG PO CAPS: 100.0000 mg | ORAL_CAPSULE | Freq: Two times a day (BID) | ORAL | 0 refills | Status: DC

## 2017-11-12 NOTE — ED Provider Notes (Signed)
[facility]    CSN: [id number] Arrival date & time: 11/12/17  1811     History   Chief Complaint Chief Complaint  Patient presents with  . Abscess    HPI ADRIONNA DELCID is a 32 y.o. female.   32 year old female, with no significant past medical history, presenting today due to possible ingrown hair on her left buttock.  States the area has been present for the past several days and has been worsening.  States that she does have a history of a previous abscess on that same buttock.  She has had subjective fever and chills.  The history is provided by the patient.  Abscess  Location:  Pelvis Pelvic abscess location:  L buttock Size:  1 cm Abscess quality: induration, painful, redness and warmth   Abscess quality: not draining and no fluctuance   Red streaking: no   Duration:  5 days Progression:  Worsening Pain details:    Quality:  Dull   Severity:  Moderate   Duration:  5 days   Timing:  Constant   Progression:  Worsening Chronicity:  Recurrent Context: not diabetes and not immunosuppression   Relieved by:  Nothing Worsened by:  Nothing Ineffective treatments:  None tried Associated symptoms: no anorexia, no fatigue, no fever, no headaches, no nausea and no vomiting   Risk factors: prior abscess   Risk factors: no family hx of MRSA and no hx of MRSA     Past Medical History:  Diagnosis Date  . Diabetes mellitus without complication [facility])     Patient Active Problem List   Diagnosis Date Noted  . DKA, type 1 ([place]) 05/19/2017  . Colitis   . Prolonged QT interval   . Hyperpigmentation of skin, postinflammatory 08/07/2016  . Dizziness 04/25/2016  . DKA (diabetic ketoacidoses) ([place]) 04/25/2016  . Rhinitis, allergic 10/11/2015  . Diabetes type 1, uncontrolled ([place]) 07/25/2015  . Cephalalgia 07/25/2015  . Generalized anxiety disorder 07/25/2015  . Depression 07/25/2015  . Diabetic ketoacidosis without coma associated with type 1 diabetes mellitus  ([place]) 07/11/2012  . Leukocytosis 07/11/2012  . Hyperkalemia 07/11/2012  . High anion gap metabolic acidosis 56/21/3086    History reviewed. No pertinent surgical history.  OB History   None      Home Medications    Prior to Admission medications   Medication Sig Start Date End Date Taking? Authorizing Provider  Blood Glucose Monitoring Suppl (TRUE METRIX AIR GLUCOSE METER) DEVI 1 each by Does not apply route 4 (four) times daily -  with meals and at bedtime. 07/25/15   [name], FNP  cholecalciferol (VITAMIN D) 1000 units tablet Take 1,000 Units by mouth daily.    [provider]  doxycycline (VIBRAMYCIN) 100 MG capsule Take 1 capsule (100 mg total) by mouth 2 (two) times daily. 11/12/17   Blue, Olivia C, PA-C  glucose blood (TRUE METRIX BLOOD GLUCOSE TEST) test strip Use as instructed 07/25/15   [name], FNP  HYDROcodone-acetaminophen (NORCO/VICODIN) 5-325 MG tablet Take 1 tablet by mouth every 6 (six) hours as needed. 11/12/17   Blue, Olivia C, PA-C  insulin aspart (NOVOLOG) 100 UNIT/ML injection Inject 0-20 Units into the skin 3 (three) times daily before meals. 08/07/16   [name], FNP  insulin detemir (LEVEMIR) 100 unit/ml SOLN Inject 0.2 mLs (20 Units total) into the skin at bedtime. 08/07/16   [name], FNP  INSULIN SYRINGE .5CC/29G 29G X 1/2" 0.5 ML MISC 1 each by Does  not apply route 4 (four) times daily - after meals and at bedtime. 07/25/15   [name], FNP  Lancet Device MISC 1 each by Does not apply route 4 (four) times daily - after meals and at bedtime. 07/25/15   [name], FNP  loratadine (CLARITIN) 10 MG tablet Take 10 mg by mouth daily.    [provider]  Probiotic Product (PROBIOTIC DAILY PO) Take by mouth.    [provider]  vitamin C (ASCORBIC ACID) 500 MG tablet Take 500 mg by mouth daily.    [provider]    Family History Family History  Problem Relation Age of Onset  .  Throat cancer Mother   . Diabetes Maternal Aunt     Social History Social History   Tobacco Use  . Smoking status: Never Smoker  . Smokeless tobacco: Never Used  Substance Use Topics  . Alcohol use: Yes    Comment: occasionally   . Drug use: No     Allergies   Patient has no known allergies.   Review of Systems Review of Systems  Constitutional: Negative for chills, fatigue and fever.  HENT: Negative for ear pain and sore throat.   Eyes: Negative for pain and visual disturbance.  Respiratory: Negative for cough and shortness of breath.   Cardiovascular: Negative for chest pain and palpitations.  Gastrointestinal: Negative for abdominal pain, anorexia, nausea and vomiting.  Genitourinary: Negative for dysuria and hematuria.  Musculoskeletal: Negative for arthralgias and back pain.  Skin: Positive for wound (left buttock ). Negative for color change and rash.  Neurological: Negative for seizures, syncope and headaches.  All other systems reviewed and are negative.    Physical Exam Triage Vital Signs ED Triage Vitals  Enc Vitals Group     BP 11/12/17 1855 118/80     Pulse Rate 11/12/17 1855 (!) 117     Resp 11/12/17 1855 16     Temp 11/12/17 1855 99.4 F (37.4 C)     Temp Source 11/12/17 1855 Oral     SpO2 11/12/17 1855 100 %     Weight --      Height --      Head Circumference --      Peak Flow --      Pain Score 11/12/17 1856 9     Pain Loc --      Pain Edu? --      Excl. in [place]? --    No data found.  Updated Vital Signs BP 118/80 (BP Location: Left Arm)   Pulse (!) 117 Comment: reported HR to Nurse Traci Bast  Temp 99.4 F (37.4 C) (Oral)   Resp 16   LMP 11/04/2017   SpO2 100%   Visual Acuity Right Eye Distance:   Left Eye Distance:   Bilateral Distance:    Right Eye Near:   Left Eye Near:    Bilateral Near:     Physical Exam  Constitutional: She appears well-developed and well-nourished. No distress.  HENT:  Head: Normocephalic and  atraumatic.  Eyes: Conjunctivae are normal.  Neck: Neck supple.  Cardiovascular: Normal rate and regular rhythm.  No murmur heard. Pulmonary/Chest: Effort normal and breath sounds normal. No respiratory distress.  Abdominal: Soft. There is no tenderness.  Musculoskeletal: She exhibits no edema.  Neurological: She is alert.  Skin: Skin is warm and dry.     Small area of cellulitis to the left inner buttocks secondary to ingrown hair.  No area of  central fluctuance or abscess amenable to drainage.  No red streaking  Psychiatric: She has a normal mood and affect.  Nursing note and vitals reviewed.    UC Treatments / Results  Labs (all labs ordered are listed, but only abnormal results are displayed) Labs Reviewed - No data to display  EKG None Radiology No results found.  Procedures Procedures (including critical care time)  Medications Ordered in UC Medications - No data to display   Initial Impression / Assessment and Plan / UC Course  I have reviewed the triage vital signs and the nursing notes.  Pertinent labs & imaging results that were available during my care of the patient were reviewed by me and considered in my medical decision making (see chart for details).     Cellulitis to the left buttock without evidence of abscess.  Will treat with antibiotics and pain medications.  Recommended warm compresses/warm baths Return precautions discussed  Final Clinical Impressions(s) / UC Diagnoses   Final diagnoses:  Cellulitis of left buttock    ED Discharge Orders        Ordered    doxycycline (VIBRAMYCIN) 100 MG capsule  2 times daily     11/12/17 1904    HYDROcodone-acetaminophen (NORCO/VICODIN) 5-325 MG tablet  Every 6 hours PRN     11/12/17 1904       Controlled Substance Prescriptions [place] Controlled Substance Registry consulted? Yes, I have consulted the [place] Controlled Substances Registry for this patient, and feel the risk/benefit ratio today is favorable  for proceeding with this prescription for a controlled substance.   Phebe Colla, [place] 11/12/17 1905

## 2017-11-12 NOTE — ED Triage Notes (Signed)
Abscess near rectum, medial left buttocks.

## 2017-12-03 ENCOUNTER — Encounter (HOSPITAL_COMMUNITY): Payer: Self-pay

## 2017-12-03 ENCOUNTER — Other Ambulatory Visit: Payer: Self-pay

## 2017-12-03 ENCOUNTER — Emergency Department (HOSPITAL_COMMUNITY)
Admission: EM | Admit: 2017-12-03 | Discharge: 2017-12-03 | Disposition: A | Discharge status: Home / Self Care | Payer: No Typology Code available for payment source | Attending: Emergency Medicine | Admitting: Emergency Medicine

## 2017-12-03 DIAGNOSIS — L0291 Cutaneous abscess, unspecified: Secondary | ICD-10-CM

## 2017-12-03 DIAGNOSIS — L0231 Cutaneous abscess of buttock: Secondary | ICD-10-CM | POA: Insufficient documentation

## 2017-12-03 DIAGNOSIS — Z79899 Other long term (current) drug therapy: Secondary | ICD-10-CM | POA: Insufficient documentation

## 2017-12-03 DIAGNOSIS — E109 Type 1 diabetes mellitus without complications: Secondary | ICD-10-CM | POA: Insufficient documentation

## 2017-12-03 DIAGNOSIS — E860 Dehydration: Secondary | ICD-10-CM | POA: Insufficient documentation

## 2017-12-03 DIAGNOSIS — R110 Nausea: Secondary | ICD-10-CM | POA: Insufficient documentation

## 2017-12-03 LAB — BASIC METABOLIC PANEL
Anion gap: 12 (ref 5–15)
BUN: 10 mg/dL (ref 6–20)
CO2: 26 mmol/L (ref 22–32)
Calcium: 9 mg/dL (ref 8.9–10.3)
Chloride: 101 mmol/L (ref 101–111)
Creatinine, Ser: 0.56 mg/dL (ref 0.44–1.00)
GFR calc Af Amer: >60 mL/min (ref >60)
GFR calc non Af Amer: >60 mL/min (ref >60)
Glucose, Bld: 322 mg/dL — ABNORMAL HIGH (ref 65–99)
Potassium: 3.7 mmol/L (ref 3.5–5.1)
Sodium: 139 mmol/L (ref 135–145)

## 2017-12-03 LAB — URINALYSIS, ROUTINE W REFLEX MICROSCOPIC
Bacteria, UA: NONE SEEN
Bilirubin Urine: NEGATIVE
Glucose, UA: >=500 mg/dL — AB
Hgb urine dipstick: NEGATIVE
Ketones, ur: 80 mg/dL — AB
Leukocytes, UA: NEGATIVE
Nitrite: NEGATIVE
Protein, ur: NEGATIVE mg/dL
Specific Gravity, Urine: 1.027 (ref 1.005–1.030)
pH: 7 (ref 5.0–8.0)

## 2017-12-03 LAB — CBC
HCT: 35 % — ABNORMAL LOW (ref 36.0–46.0)
Hemoglobin: 11.6 g/dL — ABNORMAL LOW (ref 12.0–15.0)
MCH: 28 pg (ref 26.0–34.0)
MCHC: 33.1 g/dL (ref 30.0–36.0)
MCV: 84.5 fL (ref 78.0–100.0)
Platelets: 342 10*3/uL (ref 150–400)
RBC: 4.14 MIL/uL (ref 3.87–5.11)
RDW: 13.7 % (ref 11.5–15.5)
WBC: 6.9 10*3/uL (ref 4.0–10.5)

## 2017-12-03 LAB — CBG MONITORING, ED: Glucose-Capillary: 259 mg/dL — ABNORMAL HIGH (ref 65–99)

## 2017-12-03 LAB — I-STAT BETA HCG BLOOD, ED (MC, WL, AP ONLY): I-stat hCG, quantitative: <5 m[IU]/mL (ref <5)

## 2017-12-03 MED ORDER — ONDANSETRON HCL 4 MG/2ML IJ SOLN
4.0000 mg | Freq: Once | INTRAMUSCULAR | Status: AC
  Administered 2017-12-03: 4 mg via INTRAVENOUS
  Filled 2017-12-03: qty 2

## 2017-12-03 MED ORDER — SODIUM CHLORIDE 0.9 % IV BOLUS
1000.0000 mL | Freq: Once | INTRAVENOUS | Status: AC
  Administered 2017-12-03: 1000 mL via INTRAVENOUS

## 2017-12-03 MED ORDER — SULFAMETHOXAZOLE-TRIMETHOPRIM 800-160 MG PO TABS: 1.0000 | ORAL_TABLET | Freq: Two times a day (BID) | ORAL | 0 refills | Status: AC

## 2017-12-03 MED ORDER — LIDOCAINE-EPINEPHRINE (PF) 2 %-1:200000 IJ SOLN
10.0000 mL | Freq: Once | INTRAMUSCULAR | Status: AC
  Administered 2017-12-03: 10 mL
  Filled 2017-12-03: qty 20

## 2017-12-03 MED ORDER — FLUCONAZOLE 200 MG PO TABS: 200.0000 mg | ORAL_TABLET | Freq: Every day | ORAL | 0 refills | Status: AC

## 2017-12-03 NOTE — ED Provider Notes (Signed)
[place] DEPT Provider Note   CSN: [id number] Arrival date & time: 12/03/17  1332     History   Chief Complaint Chief Complaint  Patient presents with  . Hypergylcemia  . Weakness  . Emesis    HPI [name] is a 32 y.o. female.  32 year old female with prior history significant for diabetes, DKA, depression, and prolonged QT presents with complaint of feeling tired and fatigued.  She complains of associated nausea.  She denies any fever, vomiting, chest pain, shortness of breath, or other acute complaint.    Patient does report that she has been dealing with a infected area on the left posterior buttock.  She has taken - intermittently  - antibiotics for same over the last 2 weeks.  She feels that she has an "ingrown hair" to the left buttock.  She was also concerned that she may have DKA today.  The history is provided by the patient.  Weakness  Primary symptoms include no focal weakness, no memory loss, no movement disorder. This is a new problem. The current episode started more than 1 week ago. The problem has not changed since onset.There was no focality noted. There has been no fever. Pertinent negatives include no shortness of breath, no chest pain, no vomiting, no confusion and no headaches.  Emesis   Pertinent negatives include no headaches.    Past Medical History:  Diagnosis Date  . Diabetes mellitus without complication [facility])     Patient Active Problem List   Diagnosis Date Noted  . DKA, type 1 ([place]) 05/19/2017  . Colitis   . Prolonged QT interval   . Hyperpigmentation of skin, postinflammatory 08/07/2016  . Dizziness 04/25/2016  . DKA (diabetic ketoacidoses) ([place]) 04/25/2016  . Rhinitis, allergic 10/11/2015  . Diabetes type 1, uncontrolled ([place]) 07/25/2015  . Cephalalgia 07/25/2015  . Generalized anxiety disorder 07/25/2015  . Depression 07/25/2015  . Diabetic ketoacidosis without coma associated with type 1  diabetes mellitus ([place]) 07/11/2012  . Leukocytosis 07/11/2012  . Hyperkalemia 07/11/2012  . High anion gap metabolic acidosis 93/79/0240    History reviewed. No pertinent surgical history.   OB History   None      Home Medications    Prior to Admission medications   Medication Sig Start Date End Date Taking? Authorizing Provider  ibuprofen (ADVIL,MOTRIN) 200 MG tablet Take 800 mg by mouth every 6 (six) hours as needed for moderate pain.   Yes [provider]  insulin aspart (NOVOLOG) 100 UNIT/ML injection Inject 0-20 Units into the skin 3 (three) times daily before meals. 08/07/16  Yes [name], FNP  insulin NPH Human (HUMULIN N,NOVOLIN N) 100 UNIT/ML injection Inject 20 Units into the skin at bedtime.   Yes [provider]  minocycline (MINOCIN,DYNACIN) 100 MG capsule Take 100 mg by mouth daily.   Yes [provider]  Probiotic Product (PROBIOTIC DAILY PO) Take 1 capsule by mouth daily.    Yes [provider]  Blood Glucose Monitoring Suppl (TRUE METRIX AIR GLUCOSE METER) DEVI 1 each by Does not apply route 4 (four) times daily -  with meals and at bedtime. 07/25/15   [name], FNP  doxycycline (VIBRAMYCIN) 100 MG capsule Take 1 capsule (100 mg total) by mouth 2 (two) times daily. Patient not taking: Reported on 12/03/2017 11/12/17   Camila Li C, PA-C  glucose blood (TRUE METRIX BLOOD GLUCOSE TEST) test strip Use as instructed 07/25/15   [name], FNP  HYDROcodone-acetaminophen (NORCO/VICODIN) 5-325 MG tablet Take 1 tablet by mouth every 6 (six) hours as needed. Patient not taking: Reported on 12/03/2017 11/12/17   Blue, Olivia C, PA-C  insulin detemir (LEVEMIR) 100 unit/ml SOLN Inject 0.2 mLs (20 Units total) into the skin at bedtime. Patient not taking: Reported on 12/03/2017 08/07/16   [name], FNP  INSULIN SYRINGE .5CC/29G 29G X 1/2" 0.5 ML MISC 1 each by Does not apply route 4 (four) times daily - after meals and  at bedtime. 07/25/15   [name], FNP  Lancet Device MISC 1 each by Does not apply route 4 (four) times daily - after meals and at bedtime. 07/25/15   [name], FNP    Family History Family History  Problem Relation Age of Onset  . Throat cancer Mother   . Diabetes Maternal Aunt     Social History Social History   Tobacco Use  . Smoking status: Never Smoker  . Smokeless tobacco: Never Used  Substance Use Topics  . Alcohol use: Yes    Comment: occasionally   . Drug use: No     Allergies   Patient has no known allergies.   Review of Systems Review of Systems  Respiratory: Negative for shortness of breath.   Cardiovascular: Negative for chest pain.  Gastrointestinal: Negative for vomiting.  Skin:       "Ingrown hair" left buttock  Neurological: Positive for weakness. Negative for focal weakness and headaches.  Psychiatric/Behavioral: Negative for confusion and memory loss.  All other systems reviewed and are negative.    Physical Exam Updated Vital Signs BP 137/82 (BP Location: Left Arm)   Pulse (!) 114   Temp 98.2 F (36.8 C) (Oral)   Resp 18   LMP 11/04/2017   SpO2 100%   Physical Exam  Constitutional: She is oriented to person, place, and time. She appears well-developed and well-nourished. No distress.  HENT:  Head: Normocephalic and atraumatic.  Mouth/Throat: Oropharynx is clear and moist.  Eyes: Pupils are equal, round, and reactive to light. Conjunctivae and EOM are normal.  Neck: Normal range of motion. Neck supple.  Cardiovascular: Normal rate, regular rhythm and normal heart sounds.  Pulmonary/Chest: Effort normal and breath sounds normal. No respiratory distress.  Abdominal: Soft. She exhibits no distension. There is no tenderness.  Musculoskeletal: Normal range of motion. She exhibits no edema or deformity.  Neurological: She is alert and oriented to person, place, and time.  Skin: Skin is warm and dry.  Patient with likely  abscess to the left buttock.  An area of erythema with soft fluctuance at the center is noted to the left buttock.  Fluctuant area appears to be excellently 2 x 3 cm.  No surrounding cellulitis noted.  Psychiatric: She has a normal mood and affect.  Nursing note and vitals reviewed.    ED Treatments / Results  Labs (all labs ordered are listed, but only abnormal results are displayed) Labs Reviewed  BASIC METABOLIC PANEL - Abnormal; Notable for the following components:      Result Value   Glucose, Bld 322 (*)    All other components within normal limits  CBC - Abnormal; Notable for the following components:   Hemoglobin 11.6 (*)    HCT 35.0 (*)    All other components within normal limits  URINALYSIS, ROUTINE W REFLEX MICROSCOPIC - Abnormal; Notable for the following components:   Glucose, UA >=500 (*)    Ketones, ur 80 (*)  All other components within normal limits  CBG MONITORING, ED - Abnormal; Notable for the following components:   Glucose-Capillary 259 (*)    All other components within normal limits  CBG MONITORING, ED  I-STAT BETA HCG BLOOD, ED ([facility], [facility], [facility] ONLY)    EKG None  Radiology No results found.  Procedures .Marland KitchenIncision and Drainage Date/Time: 12/03/2017 4:44 PM Performed by: Valarie Merino, MD Authorized by: Valarie Merino, MD   Consent:    Consent obtained:  Verbal   Consent given by:  Patient   Risks discussed:  Bleeding, incomplete drainage, infection and pain   Alternatives discussed:  No treatment Location:    Type:  Abscess   Size:  3cm   Location: left buttock. Pre-procedure details:    Skin preparation:  Betadine Anesthesia (see MAR for exact dosages):    Anesthesia method:  Local infiltration   Local anesthetic:  Lidocaine 2% WITH epi Procedure type:    Complexity:  Simple Procedure details:    Needle aspiration: no     Incision types:  Stab incision   Incision depth:  Subcutaneous   Scalpel blade:  10   Wound management:   Probed and deloculated   Drainage:  Purulent and bloody   Drainage amount:  Scant   Wound treatment:  Drain placed   Packing materials:  1/4 in gauze Post-procedure details:    Patient tolerance of procedure:  Tolerated well, no immediate complications   (including critical care time)  Medications Ordered in ED Medications  sodium chloride 0.9 % bolus 1,000 mL (has no administration in time range)  ondansetron (ZOFRAN) injection 4 mg (has no administration in time range)  lidocaine-EPINEPHrine (XYLOCAINE W/EPI) 2 %-1:200000 (PF) injection 10 mL (has no administration in time range)     Initial Impression / Assessment and Plan / ED Course  I have reviewed the triage vital signs and the nursing notes.  Pertinent labs & imaging results that were available during my care of the patient were reviewed by me and considered in my medical decision making (see chart for details).     MDM  Screen complete  Presenting for evaluation of fatigue and associated poorly healed abscess of the left buttock.  Patient reports the symptoms and ongoing for the last 1 to 2 weeks.  She does not appear to be an extremis on my evaluation.  I&D was successfully performed to the left buttock abscess.  She tolerated this procedure well.  Screening labs do not reveal evidence of DKA. She does appear mildly dehydrated.  Patient feels significantly improved following IV fluids and I&D.  She now desires discharge home.  She is aware of the need for close follow-up.  Strict return precautions are given and understood.  Final Clinical Impressions(s) / ED Diagnoses   Final diagnoses:  Abscess  Dehydration    ED Discharge Orders        Ordered    fluconazole (DIFLUCAN) 200 MG tablet  Daily     12/03/17 1711    sulfamethoxazole-trimethoprim (BACTRIM DS,SEPTRA DS) 800-160 MG tablet  2 times daily     12/03/17 1711       Valarie Merino, MD 12/03/17 1714

## 2017-12-03 NOTE — ED Triage Notes (Signed)
Pt reports that she thinks she is in DKA. She reports SOB, emesis, hyperglycemia, and weakness that has progressed over the last 2 weeks. She is pale in triage. She also reports an abscess on her L buttock for over a month that has not healed. A&Ox4.

## 2017-12-03 NOTE — ED Notes (Signed)
Unable to update vitals due to maintenance on monitors (not functioning currently)

## 2017-12-26 ENCOUNTER — Other Ambulatory Visit: Payer: Self-pay

## 2017-12-26 ENCOUNTER — Inpatient Hospital Stay (HOSPITAL_COMMUNITY)
Admission: EM | Admit: 2017-12-26 | Discharge: 2017-12-28 | DRG: 638 | Disposition: A | Discharge status: Home / Self Care | Payer: No Typology Code available for payment source | Attending: Family Medicine | Admitting: Family Medicine

## 2017-12-26 ENCOUNTER — Encounter (HOSPITAL_COMMUNITY): Payer: Self-pay

## 2017-12-26 DIAGNOSIS — E1110 Type 2 diabetes mellitus with ketoacidosis without coma: Secondary | ICD-10-CM | POA: Diagnosis present

## 2017-12-26 DIAGNOSIS — Z531 Procedure and treatment not carried out because of patient's decision for reasons of belief and group pressure: Secondary | ICD-10-CM

## 2017-12-26 DIAGNOSIS — N179 Acute kidney failure, unspecified: Secondary | ICD-10-CM | POA: Diagnosis present

## 2017-12-26 DIAGNOSIS — IMO0002 Reserved for concepts with insufficient information to code with codable children: Secondary | ICD-10-CM | POA: Diagnosis present

## 2017-12-26 DIAGNOSIS — E860 Dehydration: Secondary | ICD-10-CM | POA: Diagnosis present

## 2017-12-26 DIAGNOSIS — Z794 Long term (current) use of insulin: Secondary | ICD-10-CM

## 2017-12-26 DIAGNOSIS — J069 Acute upper respiratory infection, unspecified: Secondary | ICD-10-CM | POA: Diagnosis present

## 2017-12-26 DIAGNOSIS — E1010 Type 1 diabetes mellitus with ketoacidosis without coma: Principal | ICD-10-CM | POA: Diagnosis present

## 2017-12-26 DIAGNOSIS — E1065 Type 1 diabetes mellitus with hyperglycemia: Secondary | ICD-10-CM | POA: Diagnosis present

## 2017-12-26 LAB — URINALYSIS, ROUTINE W REFLEX MICROSCOPIC
Bilirubin Urine: NEGATIVE
Glucose, UA: >=500 mg/dL — AB
Hgb urine dipstick: NEGATIVE
Ketones, ur: 80 mg/dL — AB
Leukocytes, UA: NEGATIVE
Nitrite: NEGATIVE
Protein, ur: 30 mg/dL — AB
Specific Gravity, Urine: 1.026 (ref 1.005–1.030)
pH: 5 (ref 5.0–8.0)

## 2017-12-26 LAB — GLUCOSE, CAPILLARY
Glucose-Capillary: 259 mg/dL — ABNORMAL HIGH (ref 65–99)
Glucose-Capillary: 156 mg/dL — ABNORMAL HIGH (ref 65–99)
Glucose-Capillary: 170 mg/dL — ABNORMAL HIGH (ref 65–99)
Glucose-Capillary: 204 mg/dL — ABNORMAL HIGH (ref 65–99)
Glucose-Capillary: 192 mg/dL — ABNORMAL HIGH (ref 65–99)
Glucose-Capillary: 156 mg/dL — ABNORMAL HIGH (ref 65–99)
Glucose-Capillary: 153 mg/dL — ABNORMAL HIGH (ref 65–99)
Glucose-Capillary: 137 mg/dL — ABNORMAL HIGH (ref 65–99)

## 2017-12-26 LAB — BASIC METABOLIC PANEL
Anion gap: 22 — ABNORMAL HIGH (ref 5–15)
Anion gap: 18 — ABNORMAL HIGH (ref 5–15)
Anion gap: 16 — ABNORMAL HIGH (ref 5–15)
BUN: 18 mg/dL (ref 6–20)
BUN: 17 mg/dL (ref 6–20)
BUN: 11 mg/dL (ref 6–20)
CO2: 10 mmol/L — ABNORMAL LOW (ref 22–32)
CO2: 9 mmol/L — ABNORMAL LOW (ref 22–32)
CO2: 9 mmol/L — ABNORMAL LOW (ref 22–32)
Calcium: 9.7 mg/dL (ref 8.9–10.3)
Calcium: 8.9 mg/dL (ref 8.9–10.3)
Calcium: 8.4 mg/dL — ABNORMAL LOW (ref 8.9–10.3)
Chloride: 102 mmol/L (ref 101–111)
Chloride: 108 mmol/L (ref 101–111)
Chloride: 112 mmol/L — ABNORMAL HIGH (ref 101–111)
Creatinine, Ser: 1.01 mg/dL — ABNORMAL HIGH (ref 0.44–1.00)
Creatinine, Ser: 0.92 mg/dL (ref 0.44–1.00)
Creatinine, Ser: 0.77 mg/dL (ref 0.44–1.00)
GFR calc Af Amer: >60 mL/min (ref >60)
GFR calc Af Amer: >60 mL/min (ref >60)
GFR calc Af Amer: >60 mL/min (ref >60)
GFR calc non Af Amer: >60 mL/min (ref >60)
GFR calc non Af Amer: >60 mL/min (ref >60)
GFR calc non Af Amer: >60 mL/min (ref >60)
Glucose, Bld: 388 mg/dL — ABNORMAL HIGH (ref 65–99)
Glucose, Bld: 252 mg/dL — ABNORMAL HIGH (ref 65–99)
Glucose, Bld: 203 mg/dL — ABNORMAL HIGH (ref 65–99)
Potassium: 5 mmol/L (ref 3.5–5.1)
Potassium: 4.9 mmol/L (ref 3.5–5.1)
Potassium: 4.8 mmol/L (ref 3.5–5.1)
Sodium: 134 mmol/L — ABNORMAL LOW (ref 135–145)
Sodium: 135 mmol/L (ref 135–145)
Sodium: 137 mmol/L (ref 135–145)

## 2017-12-26 LAB — I-STAT CG4 LACTIC ACID, ED: Lactic Acid, Venous: 1.38 mmol/L (ref 0.5–1.9)

## 2017-12-26 LAB — CBG MONITORING, ED
Glucose-Capillary: 407 mg/dL — ABNORMAL HIGH (ref 65–99)
Glucose-Capillary: 373 mg/dL — ABNORMAL HIGH (ref 65–99)

## 2017-12-26 LAB — BLOOD GAS, VENOUS
Acid-base deficit: 18.8 mmol/L — ABNORMAL HIGH (ref 0.0–2.0)
Bicarbonate: 9.6 mmol/L — ABNORMAL LOW (ref 20.0–28.0)
O2 Saturation: 70.4 %
Patient temperature: 98.6
pCO2, Ven: 29.9 mmHg — ABNORMAL LOW (ref 44.0–60.0)
pH, Ven: 7.132 — CL (ref 7.250–7.430)
pO2, Ven: 43.9 mmHg (ref 32.0–45.0)

## 2017-12-26 LAB — CBC
HCT: 48 % — ABNORMAL HIGH (ref 36.0–46.0)
Hemoglobin: 15.9 g/dL — ABNORMAL HIGH (ref 12.0–15.0)
MCH: 29 pg (ref 26.0–34.0)
MCHC: 33.1 g/dL (ref 30.0–36.0)
MCV: 87.4 fL (ref 78.0–100.0)
Platelets: 326 10*3/uL (ref 150–400)
RBC: 5.49 MIL/uL — ABNORMAL HIGH (ref 3.87–5.11)
RDW: 13.2 % (ref 11.5–15.5)
WBC: 5.2 10*3/uL (ref 4.0–10.5)

## 2017-12-26 LAB — MRSA PCR SCREENING: MRSA by PCR: NEGATIVE

## 2017-12-26 LAB — I-STAT BETA HCG BLOOD, ED (MC, WL, AP ONLY): I-stat hCG, quantitative: <5 m[IU]/mL (ref <5)

## 2017-12-26 MED ORDER — SODIUM CHLORIDE 0.9 % IV BOLUS
1000.0000 mL | Freq: Once | INTRAVENOUS | Status: AC
  Administered 2017-12-26: 1000 mL via INTRAVENOUS

## 2017-12-26 MED ORDER — ENOXAPARIN SODIUM 40 MG/0.4ML ~~LOC~~ SOLN
40.0000 mg | SUBCUTANEOUS | Status: DC
  Administered 2017-12-26: 40 mg via SUBCUTANEOUS
  Filled 2017-12-26 (×2): qty 0.4

## 2017-12-26 MED ORDER — SODIUM CHLORIDE 0.9 % IV SOLN
INTRAVENOUS | Status: DC
  Filled 2017-12-26: qty 1

## 2017-12-26 MED ORDER — METOCLOPRAMIDE HCL 5 MG/ML IJ SOLN
10.0000 mg | Freq: Once | INTRAMUSCULAR | Status: AC
  Administered 2017-12-26: 10 mg via INTRAVENOUS
  Filled 2017-12-26: qty 2

## 2017-12-26 MED ORDER — SODIUM CHLORIDE 0.9 % IV SOLN
INTRAVENOUS | Status: DC
  Administered 2017-12-26: 3.1 [IU]/h via INTRAVENOUS
  Filled 2017-12-26: qty 1

## 2017-12-26 MED ORDER — DEXTROSE-NACL 5-0.45 % IV SOLN
INTRAVENOUS | Status: DC
  Administered 2017-12-26 – 2017-12-27 (×2): via INTRAVENOUS

## 2017-12-26 MED ORDER — SODIUM CHLORIDE 0.9 % IV SOLN: INTRAVENOUS | Status: AC

## 2017-12-26 MED ORDER — DEXTROSE-NACL 5-0.45 % IV SOLN: INTRAVENOUS | Status: DC

## 2017-12-26 MED ORDER — KETOROLAC TROMETHAMINE 30 MG/ML IJ SOLN
30.0000 mg | Freq: Four times a day (QID) | INTRAMUSCULAR | Status: DC | PRN
  Administered 2017-12-26: 30 mg via INTRAVENOUS
  Filled 2017-12-26 (×2): qty 1

## 2017-12-26 MED ORDER — SODIUM CHLORIDE 0.9 % IV SOLN
INTRAVENOUS | Status: DC
  Administered 2017-12-26: 14:00:00 via INTRAVENOUS

## 2017-12-26 MED ORDER — LACTATED RINGERS IV BOLUS
1000.0000 mL | Freq: Once | INTRAVENOUS | Status: AC
  Administered 2017-12-26: 1000 mL via INTRAVENOUS

## 2017-12-26 MED ORDER — DEXTROSE 50 % IV SOLN: 25.0000 mL | INTRAVENOUS | Status: DC | PRN

## 2017-12-26 MED ORDER — SODIUM CHLORIDE 0.9 % IV SOLN
INTRAVENOUS | Status: DC
  Administered 2017-12-26: 15:00:00 via INTRAVENOUS

## 2017-12-26 NOTE — ED Notes (Signed)
ED Provider at bedside. 

## 2017-12-26 NOTE — ED Provider Notes (Signed)
[place] DEPT Provider Note   CSN: [id number] Arrival date & time: 12/26/17  1008     History   Chief Complaint Chief Complaint  Patient presents with  . Hyperglycemia  . Weakness    HPI [name] is a 32 y.o. female.  HPI Resents with concern of fatigue, nausea, vomiting. Onset was about 1 week ago, and since onset in spite of drinking plenty fluids, the patient has progressive fatigue, persistent anorexia, and has had multiple episodes of vomiting. No focal pain, though she has generalized discomfort. No fever, no confusion, no syncope. She is here with a female companion who assists with the HPI. Past Medical History:  Diagnosis Date  . Diabetes mellitus without complication [facility])     Patient Active Problem List   Diagnosis Date Noted  . DKA, type 1 ([place]) 05/19/2017  . Colitis   . Prolonged QT interval   . Hyperpigmentation of skin, postinflammatory 08/07/2016  . Dizziness 04/25/2016  . DKA (diabetic ketoacidoses) ([place]) 04/25/2016  . Rhinitis, allergic 10/11/2015  . Diabetes type 1, uncontrolled ([place]) 07/25/2015  . Cephalalgia 07/25/2015  . Generalized anxiety disorder 07/25/2015  . Depression 07/25/2015  . Diabetic ketoacidosis without coma associated with type 1 diabetes mellitus ([place]) 07/11/2012  . Leukocytosis 07/11/2012  . Hyperkalemia 07/11/2012  . High anion gap metabolic acidosis 96/28/3662    No past surgical history on file.   OB History   None      Home Medications    Prior to Admission medications   Medication Sig Start Date End Date Taking? Authorizing Provider  Blood Glucose Monitoring Suppl (TRUE METRIX AIR GLUCOSE METER) DEVI 1 each by Does not apply route 4 (four) times daily -  with meals and at bedtime. 07/25/15   [name], FNP  doxycycline (VIBRAMYCIN) 100 MG capsule Take 1 capsule (100 mg total) by mouth 2 (two) times daily. Patient not taking: Reported on 12/03/2017 11/12/17   Blue,  Minette Brine C, PA-C  glucose blood (TRUE METRIX BLOOD GLUCOSE TEST) test strip Use as instructed 07/25/15   [name], FNP  HYDROcodone-acetaminophen (NORCO/VICODIN) 5-325 MG tablet Take 1 tablet by mouth every 6 (six) hours as needed. Patient not taking: Reported on 12/03/2017 11/12/17   Blue, Olivia C, PA-C  ibuprofen (ADVIL,MOTRIN) 200 MG tablet Take 800 mg by mouth every 6 (six) hours as needed for moderate pain.    [provider]  insulin aspart (NOVOLOG) 100 UNIT/ML injection Inject 0-20 Units into the skin 3 (three) times daily before meals. 08/07/16   [name], FNP  insulin detemir (LEVEMIR) 100 unit/ml SOLN Inject 0.2 mLs (20 Units total) into the skin at bedtime. Patient not taking: Reported on 12/03/2017 08/07/16   [name], FNP  insulin NPH Human (HUMULIN N,NOVOLIN N) 100 UNIT/ML injection Inject 20 Units into the skin at bedtime.    [provider]  INSULIN SYRINGE .5CC/29G 29G X 1/2" 0.5 ML MISC 1 each by Does not apply route 4 (four) times daily - after meals and at bedtime. 07/25/15   [name], FNP  Lancet Device MISC 1 each by Does not apply route 4 (four) times daily - after meals and at bedtime. 07/25/15   [name], FNP  minocycline (MINOCIN,DYNACIN) 100 MG capsule Take 100 mg by mouth daily.    [provider]  Probiotic Product (PROBIOTIC DAILY PO) Take 1 capsule by mouth daily.     [provider]  Family History Family History  Problem Relation Age of Onset  . Throat cancer Mother   . Diabetes Maternal Aunt     Social History Social History   Tobacco Use  . Smoking status: Never Smoker  . Smokeless tobacco: Never Used  Substance Use Topics  . Alcohol use: Yes    Comment: occasionally   . Drug use: No     Allergies   Patient has no known allergies.   Review of Systems Review of Systems  Gastrointestinal: Positive for nausea and vomiting.     Physical Exam Updated Vital Signs BP  (!) 127/92   Pulse (!) 103   Resp (!) 21   LMP  (LMP Unknown)   SpO2 100%   Physical Exam  Constitutional: She is oriented to person, place, and time. She appears well-developed and well-nourished. No distress.  HENT:  Head: Normocephalic and atraumatic.  Eyes: Conjunctivae and EOM are normal.  Cardiovascular: Regular rhythm.  Tachycardia  Pulmonary/Chest: Effort normal and breath sounds normal. No stridor. No respiratory distress.  Abdominal: She exhibits no distension. There is no tenderness. There is no guarding.  Musculoskeletal: She exhibits no edema.  Neurological: She is alert and oriented to person, place, and time. No cranial nerve deficit.  Skin: Skin is warm and dry.  Psychiatric: She has a normal mood and affect.  Nursing note and vitals reviewed.    ED Treatments / Results  Labs (all labs ordered are listed, but only abnormal results are displayed) Labs Reviewed  CBC - Abnormal; Notable for the following components:      Result Value   RBC 5.49 (*)    Hemoglobin 15.9 (*)    HCT 48.0 (*)    All other components within normal limits  BLOOD GAS, VENOUS - Abnormal; Notable for the following components:   pH, Ven 7.132 (*)    pCO2, Ven 29.9 (*)    Bicarbonate 9.6 (*)    Acid-base deficit 18.8 (*)    All other components within normal limits  CBG MONITORING, ED - Abnormal; Notable for the following components:   Glucose-Capillary 407 (*)    All other components within normal limits  BASIC METABOLIC PANEL  URINALYSIS, ROUTINE W REFLEX MICROSCOPIC  CBG MONITORING, ED  I-STAT BETA HCG BLOOD, ED ([facility], [facility], [facility] ONLY)  I-STAT CG4 LACTIC ACID, ED    EKG EKG Interpretation  Date/Time:  Saturday December 26 2017 11:11:44 EDT Ventricular Rate:  102 PR Interval:    QRS Duration: 73 QT Interval:  348 QTC Calculation: 454 R Axis:   4 Text Interpretation:  Sinus tachycardia Low voltage, extremity and precordial leads Abnormal ekg Confirmed by Carmin Muskrat [phone]) on  12/26/2017 11:20:55 AM   Procedures Procedures (including critical care time)  Medications Ordered in ED Medications  metoCLOPramide (REGLAN) injection 10 mg (has no administration in time range)     Initial Impression / Assessment and Plan / ED Course  I have reviewed the triage vital signs and the nursing notes.  Pertinent labs & imaging results that were available during my care of the patient were reviewed by me and considered in my medical decision making (see chart for details).     11:46 AM Initial labs notable for acidosis with pH 7.1. With hyperglycemia, concern for DKA, patient has received IV fluids, is receiving Reglan.  Making labs notable for hyperglycemia, anion gap 22. Patient is received fluid resuscitation empirically, antiemetics initially, and I will start glucose stabilizer, insulin infusion.   3:28  PM Just prior to transfer upstairs the patient continues to improve, blood pressure remains stable, symptoms have improved substantially, she remains awake and alert. I discussed her case with our hospitalist colleagues, and given concern for DKA, after initiation of the IV fluids, antiemetics, and continuous insulin, she was admitted for further evaluation and management.    Final Clinical Impressions(s) / ED Diagnoses  Diabetic ketoacidosis  CRITICAL CARE Performed by: Carmin Muskrat Total critical care time: 35 minutes Critical care time was exclusive of separately billable procedures and treating other patients. Critical care was necessary to treat or prevent imminent or life-threatening deterioration. Critical care was time spent personally by me on the following activities: development of treatment plan with patient and/or surrogate as well as nursing, discussions with consultants, evaluation of patient's response to treatment, examination of patient, obtaining history from patient or surrogate, ordering and performing treatments and interventions,  ordering and review of laboratory studies, ordering and review of radiographic studies, pulse oximetry and re-evaluation of patient's condition.    Carmin Muskrat, MD 12/26/17 [phone]

## 2017-12-26 NOTE — ED Notes (Signed)
ED TO INPATIENT HANDOFF REPORT  Name/Age/Gender [name] 32 y.o. female  Code Status    Code Status Orders  (From admission, onward)        Start     Ordered   12/26/17 1421  Full code  Continuous     12/26/17 1420    Code Status History    Date Active Date Inactive Code Status Order ID Comments User Context   05/19/2017 1036 05/20/2017 2036 Full Code [id number]  Merton Border, MD Inpatient   04/25/2016 0203 04/26/2016 1645 Full Code [id number]  [name], MD ED   02/10/2013 1520 02/12/2013 2126 Full Code [id number]  Theodis Blaze, MD Inpatient   07/11/2012 1710 07/14/2012 1631 Full Code [id number]  Cheek, Lester [place], RN Inpatient      Home/SNF/Other Home  Chief Complaint weakness/emesis  Level of Care/Admitting Diagnosis ED Disposition    ED Disposition Condition [facility] Area: [facility] [[id number]]  Level of Care: Stepdown [14]  Admit to SDU based on following criteria: Severe physiological/psychological symptoms:  Any diagnosis requiring assessment & intervention at least every 4 hours on an ongoing basis to obtain desired patient outcomes including stability and rehabilitation  Diagnosis: DKA (diabetic ketoacidoses) [facility]) [038882]  Admitting Physician: Mariel Aloe [phone]  Attending Physician: Mariel Aloe [phone]  Estimated length of stay: past midnight tomorrow  Certification:: I certify this patient will need inpatient services for at least 2 midnights  PT Class (Do Not Modify): Inpatient [101]  PT Acc Code (Do Not Modify): Private [1]       Medical History Past Medical History:  Diagnosis Date  . Diabetes mellitus without complication ([place])     Allergies No Known Allergies  IV Location/Drains/Wounds Patient Lines/Drains/Airways Status   Active Line/Drains/Airways    Name:   Placement date:   Placement time:   Site:   Days:   Peripheral IV 12/26/17 Right;Upper Arm   12/26/17    1107    Arm    less than 1   Peripheral IV 12/26/17 Left Wrist   12/26/17    1107    Wrist   less than 1          Labs/Imaging Results for orders placed or performed during the hospital encounter of 12/26/17 (from the past 48 hour(s))  CBG monitoring, ED     Status: Abnormal   Collection Time: 12/26/17 10:30 AM  Result Value Ref Range   Glucose-Capillary 407 (H) 65 - 99 mg/dL  Basic metabolic panel     Status: Abnormal   Collection Time: 12/26/17 11:00 AM  Result Value Ref Range   Sodium 134 (L) 135 - 145 mmol/L   Potassium 5.0 3.5 - 5.1 mmol/L   Chloride 102 101 - 111 mmol/L   CO2 10 (L) 22 - 32 mmol/L   Glucose, Bld 388 (H) 65 - 99 mg/dL   BUN 18 6 - 20 mg/dL   Creatinine, Ser 1.01 (H) 0.44 - 1.00 mg/dL   Calcium 9.7 8.9 - 10.3 mg/dL   GFR calc non Af Amer >60 >60 mL/min   GFR calc Af Amer >60 >60 mL/min    Comment: (NOTE) The eGFR has been calculated using the CKD EPI equation. This calculation has not been validated in all clinical situations. eGFR's persistently <60 mL/min signify possible Chronic Kidney Disease.    Anion gap 22 (H) 5 - 15    Comment: Performed at [facility], [address] [place], [place] [address]  CBC     Status: Abnormal   Collection Time: 12/26/17 11:00 AM  Result Value Ref Range   WBC 5.2 4.0 - 10.5 K/uL   RBC 5.49 (H) 3.87 - 5.11 MIL/uL   Hemoglobin 15.9 (H) 12.0 - 15.0 g/dL   HCT 48.0 (H) 36.0 - 46.0 %   MCV 87.4 78.0 - 100.0 fL   MCH 29.0 26.0 - 34.0 pg   MCHC 33.1 30.0 - 36.0 g/dL   RDW 13.2 11.5 - 15.5 %   Platelets 326 150 - 400 K/uL    Comment: Performed at [facility], [place] [address] [place], [place] [address]  I-Stat beta hCG blood, ED     Status: None   Collection Time: 12/26/17 11:06 AM  Result Value Ref Range   I-stat hCG, quantitative <5.0 <5 mIU/mL   Comment 3            Comment:   GEST. AGE      CONC.  (mIU/mL)   <=1 WEEK        5 - 50     2 WEEKS       50 - 500     3 WEEKS       100 -  10,000     4 WEEKS     1,000 - 30,000        FEMALE AND NON-PREGNANT FEMALE:     LESS THAN 5 mIU/mL   I-Stat CG4 Lactic Acid, ED     Status: None   Collection Time: 12/26/17 11:08 AM  Result Value Ref Range   Lactic Acid, Venous 1.38 0.5 - 1.9 mmol/L  Blood gas, venous     Status: Abnormal   Collection Time: 12/26/17 11:09 AM  Result Value Ref Range   pH, Ven 7.132 (LL) 7.250 - 7.430    Comment: RBV DR.LOCKWOOD,MD BY LISA CRADDOCK,RRT,RCP ON 12/26/17 AT 1118   pCO2, Ven 29.9 (L) 44.0 - 60.0 mmHg   pO2, Ven 43.9 32.0 - 45.0 mmHg   Bicarbonate 9.6 (L) 20.0 - 28.0 mmol/L   Acid-base deficit 18.8 (H) 0.0 - 2.0 mmol/L   O2 Saturation 70.4 %   Patient temperature 98.6    Collection site VEIN    Drawn by DRAWN BY RN    Sample type VEIN     Comment: Performed at [facility], [place] [address], [place], [place] [address]  Urinalysis, Routine w reflex microscopic     Status: Abnormal   Collection Time: 12/26/17 12:17 PM  Result Value Ref Range   Color, Urine STRAW (A) YELLOW   APPearance CLEAR CLEAR   Specific Gravity, Urine 1.026 1.005 - 1.030   pH 5.0 5.0 - 8.0   Glucose, UA >=500 (A) NEGATIVE mg/dL   Hgb urine dipstick NEGATIVE NEGATIVE   Bilirubin Urine NEGATIVE NEGATIVE   Ketones, ur 80 (A) NEGATIVE mg/dL   Protein, ur 30 (A) NEGATIVE mg/dL   Nitrite NEGATIVE NEGATIVE   Leukocytes, UA NEGATIVE NEGATIVE   RBC / HPF 0-5 0 - 5 RBC/hpf   WBC, UA 0-5 0 - 5 WBC/hpf   Bacteria, UA RARE (A) NONE SEEN   Squamous Epithelial / LPF 0-5 0 - 5    Comment: Performed at [facility], [place] [address] [place], [place] [address]  CBG monitoring, ED     Status: Abnormal   Collection Time: 12/26/17  1:38 PM  Result Value Ref Range  Glucose-Capillary 373 (H) 65 - 99 mg/dL   No results found.  Pending Labs Unresulted Labs (From admission, onward)   Start     Ordered   01/02/18 0500  Creatinine, serum  (enoxaparin (LOVENOX)    CrCl >/= 30 ml/min)  Weekly,    R    Comments:  while on enoxaparin therapy    12/26/17 1420   12/26/17 2549  Basic metabolic panel  STAT Now then every 4 hours ,   STAT     12/26/17 1420   12/26/17 1434  MRSA PCR Screening  Once,   R     12/26/17 1433   12/26/17 1421  HIV antibody (Routine Testing)  Once,   R     12/26/17 1420      Vitals/Pain Today's Vitals   12/26/17 1330 12/26/17 1340 12/26/17 1400 12/26/17 1430  BP: 125/75  (!) 141/86 140/88  Pulse: (!) 112  (!) 106 (!) 109  Resp: 20  18 17   Temp:  97.8 F (36.6 C)    TempSrc:  Oral    SpO2: 97%  99% 100%  PainSc:        Isolation Precautions No active isolations  Medications Medications  0.9 %  sodium chloride infusion (has no administration in time range)  0.9 %  sodium chloride infusion (has no administration in time range)  dextrose 5 %-0.45 % sodium chloride infusion (has no administration in time range)  insulin regular (NOVOLIN R,HUMULIN R) 100 Units in sodium chloride 0.9 % 100 mL (1 Units/mL) infusion (has no administration in time range)  enoxaparin (LOVENOX) injection 40 mg (has no administration in time range)  metoCLOPramide (REGLAN) injection 10 mg (10 mg Intravenous Given 12/26/17 1155)  lactated ringers bolus 1,000 mL (0 mLs Intravenous Stopped 12/26/17 1434)  sodium chloride 0.9 % bolus 1,000 mL (1,000 mLs Intravenous New Bag/Given 12/26/17 1339)    Followed by  sodium chloride 0.9 % bolus 1,000 mL (1,000 mLs Intravenous New Bag/Given 12/26/17 1351)    Mobility walks

## 2017-12-26 NOTE — ED Notes (Signed)
Report given to [place] , 2W Room 1230.

## 2017-12-26 NOTE — Progress Notes (Signed)
Inpatient Diabetes Program Recommendations  AACE/ADA: New Consensus Statement on Inpatient Glycemic Control (2015)  Target Ranges:  Prepandial:   less than 140 mg/dL      Peak postprandial:   less than 180 mg/dL (1-2 hours)      Critically ill patients:  140 - 180 mg/dL   Review of Glycemic Control  Diabetes history: DM 1 Outpatient Diabetes medications: NPH 20 units QHS, Novolog 0-20 units tid with meals Current orders for Inpatient glycemic control: IV insulin gtt per DKA protocol  Inpatient Diabetes Program Recommendations:    Patient reports taking insulin as prescribed and checking her CBG more frequently as per sick day guidelines. Patient reports her glucose just continued to increase.   Per RN glucose trending downward now in the 200 range. Will be transitioning to D5 NS soon.  Based on patient's home regimen, at time of transition, consider NPH 18-20 units QHS, and Novolog Moderate Correction 0-15 units tid + Novolog HS scale 0-5 units, may also consider meal coverage when eating 2-3 units.  Last A1c 7.7% in January 2019. Consider obtaining another A1c level to determine glucose control over the past 2-3 months.  Thanks,  Tama Headings RN, MSN, BC-ADM, [facility] Inpatient Diabetes Coordinator Team Pager [phone] (8a-5p)

## 2017-12-26 NOTE — ED Triage Notes (Signed)
She tells me she feels "bad for about a week now". She states it feels like she has felt in the past when she has had D.K.A. She is in no distress.

## 2017-12-26 NOTE — H&P (Signed)
History and Physical    KORRA CHRISTINE XTG:[id number] DOB: 12-Dec-1985 DOA: 12/26/2017  PCP: [name], FNP Patient coming from: Home  Chief Complaint: Weakness/malaise  HPI: [name] is a 32 y.o. female with medical history significant of type 1 diabetes mellitus. Patient states she has been "feeling bad" for the past week. She has symptoms of weakness, nausea, vomiting, polyuria, polydipsia. She has a recent episode of likely URI (rhinorrhea and sneezing). She has been adherent with her insulin regimen, but her blood sugar continued to rise. She came to the ED secondary to significantly worsened symptoms to where she had trouble ambulating secondary to her weakness.  ED Course: Vitals: No temperature available. Tachycardia to 120s, respirations of low 20s, normotensive, on room air Labs: Sodium of 134, Glucose of 388, Creatinine of 1.01, anion gap of 22, CO2 of 10 Imaging: None available Medications/Course: 1L LR bolus  Review of Systems: Review of Systems  Constitutional: Positive for malaise/fatigue. Negative for chills and fever.  Respiratory: Positive for shortness of breath. Negative for cough.   Cardiovascular: Negative for chest pain.  Gastrointestinal: Positive for abdominal pain (dull ache), nausea and vomiting. Negative for constipation and diarrhea.  Genitourinary: Negative for dysuria.  Neurological: Positive for weakness.  Endo/Heme/Allergies: Positive for polydipsia.  All other systems reviewed and are negative.   Past Medical History:  Diagnosis Date  . Diabetes mellitus without complication ([place])     No past surgical history on file.   reports that she has never smoked. She has never used smokeless tobacco. She reports that she drinks alcohol. She reports that she does not use drugs.  No Known Allergies  Family History  Problem Relation Age of Onset  . Throat cancer Mother   . Diabetes Maternal Aunt     Prior to Admission medications     Medication Sig Start Date End Date Taking? Authorizing Provider  Blood Glucose Monitoring Suppl (TRUE METRIX AIR GLUCOSE METER) DEVI 1 each by Does not apply route 4 (four) times daily -  with meals and at bedtime. 07/25/15  Yes [name], FNP  glucose blood (TRUE METRIX BLOOD GLUCOSE TEST) test strip Use as instructed 07/25/15  Yes [name], FNP  insulin aspart (NOVOLOG) 100 UNIT/ML injection Inject 0-20 Units into the skin 3 (three) times daily before meals. 08/07/16  Yes [name], FNP  insulin NPH Human (HUMULIN N,NOVOLIN N) 100 UNIT/ML injection Inject 20 Units into the skin at bedtime.   Yes [provider]  INSULIN SYRINGE .5CC/29G 29G X 1/2" 0.5 ML MISC 1 each by Does not apply route 4 (four) times daily - after meals and at bedtime. 07/25/15  Yes [name], FNP  Lancet Device MISC 1 each by Does not apply route 4 (four) times daily - after meals and at bedtime. 07/25/15  Yes [name], FNP  doxycycline (VIBRAMYCIN) 100 MG capsule Take 1 capsule (100 mg total) by mouth 2 (two) times daily. Patient not taking: Reported on 12/03/2017 11/12/17   Blue, Belenda Cruise, PA-C  HYDROcodone-acetaminophen (NORCO/VICODIN) 5-325 MG tablet Take 1 tablet by mouth every 6 (six) hours as needed. Patient not taking: Reported on 12/03/2017 11/12/17   Blue, Olivia C, PA-C  insulin detemir (LEVEMIR) 100 unit/ml SOLN Inject 0.2 mLs (20 Units total) into the skin at bedtime. Patient not taking: Reported on 12/03/2017 08/07/16   [name], FNP    Physical Exam: Vitals:   12/26/17 1114 12/26/17 1145 12/26/17 1215  BP: (!) 127/92 131/88 112/74  Pulse: (!) 103 (!) 101 (!) 104  Resp: (!) 21 18 (!) 22  SpO2: 100% 98% 99%   Physical Exam  Constitutional: She is oriented to person, place, and time. She appears well-developed and well-nourished. No distress.  HENT:  Mouth/Throat: Mucous membranes are dry.  Eyes: Pupils are equal, round, and reactive to light.  Conjunctivae and EOM are normal.  Neck: Normal range of motion.  Cardiovascular: Normal rate, regular rhythm and normal heart sounds.  No murmur heard. Pulmonary/Chest: Effort normal and breath sounds normal. No respiratory distress. She has no wheezes. She has no rales.  Abdominal: Soft. Normal appearance and bowel sounds are normal. She exhibits abdominal bruit. She exhibits no distension. There is tenderness in the suprapubic area. There is no rebound and no guarding.  Musculoskeletal: Normal range of motion. She exhibits no edema or tenderness.  Lymphadenopathy:    She has no cervical adenopathy.  Neurological: She is alert and oriented to person, place, and time.  Skin: Skin is warm and dry. She is not diaphoretic.  Psychiatric: She has a normal mood and affect.  Nursing note and vitals reviewed.    Labs on Admission: I have personally reviewed following labs and imaging studies  CBC: Recent Labs  Lab 12/26/17 1100  WBC 5.2  HGB 15.9*  HCT 48.0*  MCV 87.4  PLT 998   Basic Metabolic Panel: Recent Labs  Lab 12/26/17 1100  NA 134*  K 5.0  CL 102  CO2 10*  GLUCOSE 388*  BUN 18  CREATININE 1.01*  CALCIUM 9.7   GFR: CrCl cannot be calculated (Unknown ideal weight.).  CBG: Recent Labs  Lab 12/26/17 1030  GLUCAP 407*   Urine analysis:    Component Value Date/Time   COLORURINE STRAW (A) 12/26/2017 1217   APPEARANCEUR CLEAR 12/26/2017 1217   LABSPEC 1.026 12/26/2017 1217   PHURINE 5.0 12/26/2017 1217   GLUCOSEU >=500 (A) 12/26/2017 1217   HGBUR NEGATIVE 12/26/2017 1217   BILIRUBINUR NEGATIVE 12/26/2017 1217   KETONESUR 80 (A) 12/26/2017 1217   PROTEINUR 30 (A) 12/26/2017 1217   UROBILINOGEN 0.2 08/07/2016 1610   NITRITE NEGATIVE 12/26/2017 1217   LEUKOCYTESUR NEGATIVE 12/26/2017 1217    Radiological Exams on Admission: No results found.  EKG: Independently reviewed. Sinus tachycardia  Assessment/Plan Active Problems:   Diabetes type 1, uncontrolled  (H[facility])   DKA (diabetic ketoacidoses) ([place])  DKA Diabetes mellitus, type 1 Uncontrolled with hyperglycemia. Patient recently sick with URI symptoms. Possible etiology. Patient states that she is adherent with insulin as an outpatient. -Insulin drip -q4 hour BMP, transition to home subcutaneous insulin with updated regimen -Give an extra 2 L NS bolus. Low threshold for more -Maintenance IV fluids per DKA protocol -Diabetes coordinator   DVT prophylaxis: Lovenox Code Status: Full code Family Communication: None at bedside Disposition Plan: Discharge in 48-72 hours pending resolution of DKA Consults called: None Admission status: Inpatient, stepdown unit   Cordelia Poche, MD [facility] Pager [phone]  If 7PM-7AM, please contact night-coverage [email or web address] Password [email or web address]  12/26/2017, 1:17 PM

## 2017-12-26 NOTE — ED Notes (Signed)
Bed: XQ11 Expected date:  Expected time:  Means of arrival:  Comments: Hold RES B

## 2017-12-27 DIAGNOSIS — E1065 Type 1 diabetes mellitus with hyperglycemia: Secondary | ICD-10-CM

## 2017-12-27 DIAGNOSIS — E1010 Type 1 diabetes mellitus with ketoacidosis without coma: Principal | ICD-10-CM

## 2017-12-27 LAB — BASIC METABOLIC PANEL
Anion gap: 8 (ref 5–15)
Anion gap: 12 (ref 5–15)
Anion gap: 9 (ref 5–15)
Anion gap: 9 (ref 5–15)
BUN: 10 mg/dL (ref 6–20)
BUN: 9 mg/dL (ref 6–20)
BUN: 8 mg/dL (ref 6–20)
BUN: 12 mg/dL (ref 6–20)
CO2: 15 mmol/L — ABNORMAL LOW (ref 22–32)
CO2: 13 mmol/L — ABNORMAL LOW (ref 22–32)
CO2: 14 mmol/L — ABNORMAL LOW (ref 22–32)
CO2: 16 mmol/L — ABNORMAL LOW (ref 22–32)
Calcium: 8.3 mg/dL — ABNORMAL LOW (ref 8.9–10.3)
Calcium: 8.3 mg/dL — ABNORMAL LOW (ref 8.9–10.3)
Calcium: 8.3 mg/dL — ABNORMAL LOW (ref 8.9–10.3)
Calcium: 8.6 mg/dL — ABNORMAL LOW (ref 8.9–10.3)
Chloride: 111 mmol/L (ref 101–111)
Chloride: 109 mmol/L (ref 101–111)
Chloride: 109 mmol/L (ref 101–111)
Chloride: 108 mmol/L (ref 101–111)
Creatinine, Ser: 0.66 mg/dL (ref 0.44–1.00)
Creatinine, Ser: 0.57 mg/dL (ref 0.44–1.00)
Creatinine, Ser: 0.66 mg/dL (ref 0.44–1.00)
Creatinine, Ser: 0.68 mg/dL (ref 0.44–1.00)
GFR calc Af Amer: >60 mL/min (ref >60)
GFR calc Af Amer: >60 mL/min (ref >60)
GFR calc Af Amer: >60 mL/min (ref >60)
GFR calc Af Amer: >60 mL/min (ref >60)
GFR calc non Af Amer: >60 mL/min (ref >60)
GFR calc non Af Amer: >60 mL/min (ref >60)
GFR calc non Af Amer: >60 mL/min (ref >60)
GFR calc non Af Amer: >60 mL/min (ref >60)
Glucose, Bld: 133 mg/dL — ABNORMAL HIGH (ref 65–99)
Glucose, Bld: 167 mg/dL — ABNORMAL HIGH (ref 65–99)
Glucose, Bld: 186 mg/dL — ABNORMAL HIGH (ref 65–99)
Glucose, Bld: 265 mg/dL — ABNORMAL HIGH (ref 65–99)
Potassium: 3.6 mmol/L (ref 3.5–5.1)
Potassium: 3.8 mmol/L (ref 3.5–5.1)
Potassium: 3.6 mmol/L (ref 3.5–5.1)
Potassium: 3.9 mmol/L (ref 3.5–5.1)
Sodium: 134 mmol/L — ABNORMAL LOW (ref 135–145)
Sodium: 134 mmol/L — ABNORMAL LOW (ref 135–145)
Sodium: 132 mmol/L — ABNORMAL LOW (ref 135–145)
Sodium: 133 mmol/L — ABNORMAL LOW (ref 135–145)

## 2017-12-27 LAB — GLUCOSE, CAPILLARY
Glucose-Capillary: 111 mg/dL — ABNORMAL HIGH (ref 65–99)
Glucose-Capillary: 120 mg/dL — ABNORMAL HIGH (ref 65–99)
Glucose-Capillary: 132 mg/dL — ABNORMAL HIGH (ref 65–99)
Glucose-Capillary: 159 mg/dL — ABNORMAL HIGH (ref 65–99)
Glucose-Capillary: 175 mg/dL — ABNORMAL HIGH (ref 65–99)
Glucose-Capillary: 207 mg/dL — ABNORMAL HIGH (ref 65–99)
Glucose-Capillary: 227 mg/dL — ABNORMAL HIGH (ref 65–99)
Glucose-Capillary: 258 mg/dL — ABNORMAL HIGH (ref 65–99)
Glucose-Capillary: 287 mg/dL — ABNORMAL HIGH (ref 65–99)

## 2017-12-27 LAB — HIV ANTIBODY (ROUTINE TESTING W REFLEX): HIV Screen 4th Generation wRfx: NONREACTIVE

## 2017-12-27 MED ORDER — INSULIN ASPART 100 UNIT/ML ~~LOC~~ SOLN
0.0000 [IU] | Freq: Three times a day (TID) | SUBCUTANEOUS | Status: DC
  Administered 2017-12-27: 3 [IU] via SUBCUTANEOUS
  Administered 2017-12-27 – 2017-12-28 (×2): 8 [IU] via SUBCUTANEOUS

## 2017-12-27 MED ORDER — INSULIN ASPART 100 UNIT/ML ~~LOC~~ SOLN: 0.0000 [IU] | SUBCUTANEOUS | Status: DC

## 2017-12-27 MED ORDER — INSULIN ASPART 100 UNIT/ML ~~LOC~~ SOLN
3.0000 [IU] | Freq: Three times a day (TID) | SUBCUTANEOUS | Status: DC
  Administered 2017-12-27 – 2017-12-28 (×4): 3 [IU] via SUBCUTANEOUS

## 2017-12-27 MED ORDER — POTASSIUM CHLORIDE CRYS ER 20 MEQ PO TBCR
40.0000 meq | EXTENDED_RELEASE_TABLET | Freq: Once | ORAL | Status: AC
  Administered 2017-12-27: 40 meq via ORAL
  Filled 2017-12-27: qty 2

## 2017-12-27 MED ORDER — INSULIN DETEMIR 100 UNIT/ML ~~LOC~~ SOLN
20.0000 [IU] | Freq: Every day | SUBCUTANEOUS | Status: DC
  Administered 2017-12-27 – 2017-12-28 (×2): 20 [IU] via SUBCUTANEOUS
  Filled 2017-12-27 (×3): qty 0.2

## 2017-12-27 MED ORDER — INSULIN DETEMIR 100 UNIT/ML ~~LOC~~ SOLN: 10.0000 [IU] | Freq: Once | SUBCUTANEOUS | Status: DC

## 2017-12-27 MED ORDER — INSULIN ASPART 100 UNIT/ML ~~LOC~~ SOLN
0.0000 [IU] | Freq: Every day | SUBCUTANEOUS | Status: DC
  Administered 2017-12-27: 3 [IU] via SUBCUTANEOUS

## 2017-12-27 MED ORDER — INSULIN DETEMIR 100 UNIT/ML ~~LOC~~ SOLN
10.0000 [IU] | Freq: Once | SUBCUTANEOUS | Status: AC
  Administered 2017-12-27: 10 [IU] via SUBCUTANEOUS
  Filled 2017-12-27: qty 0.1

## 2017-12-27 MED ORDER — INSULIN DETEMIR 100 UNIT/ML ~~LOC~~ SOLN: 20.0000 [IU] | Freq: Every day | SUBCUTANEOUS | Status: DC

## 2017-12-27 MED ORDER — IBUPROFEN 200 MG PO TABS
600.0000 mg | ORAL_TABLET | Freq: Once | ORAL | Status: AC
  Administered 2017-12-27: 600 mg via ORAL
  Filled 2017-12-27: qty 3

## 2017-12-27 NOTE — Progress Notes (Signed)
Inpatient Diabetes Program Recommendations  AACE/ADA: New Consensus Statement on Inpatient Glycemic Control (2015)  Target Ranges:  Prepandial:   less than 140 mg/dL      Peak postprandial:   less than 180 mg/dL (1-2 hours)      Critically ill patients:  140 - 180 mg/dL   Lab Results  Component Value Date   GLUCAP 227 (H) 12/27/2017   HGBA1C 7.7 08/07/2016   Review of Glycemic Control  Diabetes history: DM 1 Outpatient Diabetes medications: NPH 20 units QHS, Novolog 0-20 units tid with meals Current orders for Inpatient glycemic control: Levemir 20 units, Novolog Moderate Correction 0-15 units tid, Novolog HS scale 0-5 units, Novolog 3 units tid meal coverage  Inpatient Diabetes Program Recommendations:    Patient got a total of 30 units of Levemir this am (one dose of 10 units then another dose of 20 units) at time of transition watch for lows throughout the day as patient only takes 20 units of NPH at home.  Thanks,  Tama Headings RN, MSN, BC-ADM, [facility] Inpatient Diabetes Coordinator Team Pager [phone] (8a-5p)

## 2017-12-27 NOTE — Progress Notes (Signed)
PROGRESS NOTE [facility]   [name]   ZOX:[id number] DOB: 08/17/1985  DOA: 12/26/2017 PCP: [name], FNP   Brief Narrative:  [name] 32 year old female with medical history of type 1 diabetes mellitus presented to the emergency department complaining of weakness and malaise.  Prior to admission she was having URI symptoms and was unable to control her glucose.  Upon ED evaluation glucose was found to be 388 with anion gap of 22 and bicarb of 10.  Patient was admitted with working diagnosis of DKA.  Subjective: Patient seen and examined, has no complaints.  Feeling well.  Assessment & Plan: DKA in setting of diabetes mellitus type 1 Likely secondary to URI.  Patient report good compliance with insulin Patient was treated with insulin drip, transitioned to subcutaneous insulin. Monitor CBGs, check BMP in a.m. bicarb remain slight low.  Advance diet as tolerated. Adjust insulin pending 24-hour requirement Check A1c.  DVT prophylaxis: Lovenox  Code Status: Full Code  Family Communication: None at bedside  Disposition Plan: Home in AM if CO2 improved, transferred to [place]  Consultants:   None   Procedures:   None  Antimicrobials:  None   Objective: Vitals:   12/27/17 0700 12/27/17 0800 12/27/17 0804 12/27/17 0900  BP:  125/84  (!) 143/76  Pulse: 95 89  (!) 106  Resp: 16 15  18   Temp:   98.3 F (36.8 C)   TempSrc:   Oral   SpO2: 100% 100%  100%  Weight:      Height:        Intake/Output Summary (Last 24 hours) at 12/27/2017 1359 Last data filed at 12/27/2017 0800 Gross per 24 hour  Intake 4727.23 ml  Output 1400 ml  Net 3327.23 ml   Filed Weights   12/26/17 1524  Weight: 64.5 kg (142 lb 3.2 oz)    Examination:  General exam: Appears calm and comfortable  HEENT: AC/AT, PERRLA, OP moist and clear Respiratory system: Clear to auscultation. No wheezes,crackle or rhonchi Cardiovascular system: S1 & S2 heard, RRR. No JVD,  murmurs, rubs or gallops Gastrointestinal system: Abdomen is nondistended, soft and nontender.  Central nervous system: Alert and oriented. No focal neurological deficits. Extremities: No pedal edema. Symmetric, strength 5/5   Skin: No rashes, lesions or ulcers Psychiatry: Mood & affect appropriate.    Data Reviewed: I have personally reviewed following labs and imaging studies  CBC: Recent Labs  Lab 12/26/17 1100  WBC 5.2  HGB 15.9*  HCT 48.0*  MCV 87.4  PLT 811   Basic Metabolic Panel: Recent Labs  Lab 12/26/17 1922 12/27/17 0004 12/27/17 0329 12/27/17 0648 12/27/17 1200  NA 137 134* 134* 132* 133*  K 4.8 3.6 3.8 3.6 3.9  CL 112* 111 109 109 108  CO2 9* 15* 13* 14* 16*  GLUCOSE 203* 133* 167* 186* 265*  BUN 11 10 9 8 12   CREATININE 0.77 0.66 0.57 0.66 0.68  CALCIUM 8.4* 8.3* 8.3* 8.3* 8.6*   GFR: Estimated Creatinine Clearance: 95.4 mL/min (by C-G formula based on SCr of 0.68 mg/dL). Liver Function Tests: No results for input(s): AST, ALT, ALKPHOS, BILITOT, PROT, ALBUMIN in the last 168 hours. No results for input(s): LIPASE, AMYLASE in the last 168 hours. No results for input(s): AMMONIA in the last 168 hours. Coagulation Profile: No results for input(s): INR, PROTIME in the last 168 hours. Cardiac Enzymes: No results for input(s): CKTOTAL, CKMB, CKMBINDEX, TROPONINI in the last 168 hours. BNP (last 3 results)  No results for input(s): PROBNP in the last 8760 hours. HbA1C: No results for input(s): HGBA1C in the last 72 hours. CBG: Recent Labs  Lab 12/27/17 0118 12/27/17 0400 12/27/17 0516 12/27/17 0624 12/27/17 0751  GLUCAP 120* 175* 207* 227* 159*   Lipid Profile: No results for input(s): CHOL, HDL, LDLCALC, TRIG, CHOLHDL, LDLDIRECT in the last 72 hours. Thyroid Function Tests: No results for input(s): TSH, T4TOTAL, FREET4, T3FREE, THYROIDAB in the last 72 hours. Anemia Panel: No results for input(s): VITAMINB12, FOLATE, FERRITIN, TIBC, IRON,  RETICCTPCT in the last 72 hours. Sepsis Labs: Recent Labs  Lab 12/26/17 1108  LATICACIDVEN 1.38    Recent Results (from the past 240 hour(s))  MRSA PCR Screening     Status: None   Collection Time: 12/26/17  2:34 PM  Result Value Ref Range Status   MRSA by PCR NEGATIVE NEGATIVE Final    Comment:        The GeneXpert MRSA Assay (FDA approved for NASAL specimens only), is one component of a comprehensive MRSA colonization surveillance program. It is not intended to diagnose MRSA infection nor to guide or monitor treatment for MRSA infections. Performed at [facility], [place] [address] [place], [place] [address]      Radiology Studies: No results found.   Scheduled Meds: . enoxaparin (LOVENOX) injection  40 mg Subcutaneous Q24H  . insulin aspart  0-15 Units Subcutaneous TID WC  . insulin aspart  0-5 Units Subcutaneous QHS  . insulin aspart  3 Units Subcutaneous TID WC  . insulin detemir  20 Units Subcutaneous Daily   Continuous Infusions:   LOS: 1 day    Time spent: Total of 25 minutes spent with pt, greater than 50% of which was spent in discussion of  treatment, counseling and coordination of care   Chipper Oman, MD Pager: Text Page via [email or web address]   If 7PM-7AM, please contact night-coverage [email or web address] 12/27/2017, 1:59 PM   Note - This record has been created using [employer]. Chart creation errors have been sought, but may not always have been located. Such creation errors do not reflect on the standard of medical care.

## 2017-12-28 DIAGNOSIS — N179 Acute kidney failure, unspecified: Secondary | ICD-10-CM

## 2017-12-28 LAB — GLUCOSE, CAPILLARY
Glucose-Capillary: 283 mg/dL — ABNORMAL HIGH (ref 65–99)
Glucose-Capillary: 162 mg/dL — ABNORMAL HIGH (ref 65–99)

## 2017-12-28 LAB — BASIC METABOLIC PANEL
Anion gap: 10 (ref 5–15)
BUN: 15 mg/dL (ref 6–20)
CO2: 18 mmol/L — ABNORMAL LOW (ref 22–32)
Calcium: 8.3 mg/dL — ABNORMAL LOW (ref 8.9–10.3)
Chloride: 104 mmol/L (ref 101–111)
Creatinine, Ser: 0.58 mg/dL (ref 0.44–1.00)
GFR calc Af Amer: >60 mL/min (ref >60)
GFR calc non Af Amer: >60 mL/min (ref >60)
Glucose, Bld: 311 mg/dL — ABNORMAL HIGH (ref 65–99)
Potassium: 4.1 mmol/L (ref 3.5–5.1)
Sodium: 132 mmol/L — ABNORMAL LOW (ref 135–145)

## 2017-12-28 LAB — CBC
HCT: 36.1 % (ref 36.0–46.0)
Hemoglobin: 12 g/dL (ref 12.0–15.0)
MCH: 27.8 pg (ref 26.0–34.0)
MCHC: 33.2 g/dL (ref 30.0–36.0)
MCV: 83.8 fL (ref 78.0–100.0)
Platelets: 273 10*3/uL (ref 150–400)
RBC: 4.31 MIL/uL (ref 3.87–5.11)
RDW: 13.5 % (ref 11.5–15.5)
WBC: 5.8 10*3/uL (ref 4.0–10.5)

## 2017-12-28 LAB — HEMOGLOBIN A1C
Hgb A1c MFr Bld: 11.5 % — ABNORMAL HIGH (ref 4.8–5.6)
Mean Plasma Glucose: 283.35 mg/dL

## 2017-12-28 LAB — MAGNESIUM: Magnesium: 1.7 mg/dL (ref 1.7–2.4)

## 2017-12-28 MED ORDER — INSULIN NPH (HUMAN) (ISOPHANE) 100 UNIT/ML ~~LOC~~ SUSP: 20.0000 [IU] | Freq: Two times a day (BID) | SUBCUTANEOUS | 0 refills | Status: DC

## 2017-12-28 NOTE — Progress Notes (Signed)
Inpatient Diabetes Program Recommendations  AACE/ADA: New Consensus Statement on Inpatient Glycemic Control (2015)  Target Ranges:  Prepandial:   less than 140 mg/dL      Peak postprandial:   less than 180 mg/dL (1-2 hours)      Critically ill patients:  140 - 180 mg/dL   Lab Results  Component Value Date   GLUCAP 283 (H) 12/28/2017   HGBA1C 11.5 (H) 12/27/2017    Review of Glycemic Control  FBS > 300 this am. Adjust basal insulin.  Inpatient Diabetes Program Recommendations:     Increase Levemir to 24 units QD. Agree with meal coverage insulin.  Will follow closely.  Thank you. Lorenda Peck, RD, LDN, CDE Inpatient Diabetes Coordinator [phone]

## 2017-12-28 NOTE — Discharge Summary (Signed)
Physician Discharge Summary  [name]  IPJ:[id number]  DOB: [birth year]  DOA: 12/26/2017 PCP: [name], FNP  Admit date: 12/26/2017 Discharge date: 12/28/2017  Admitted From: Home  Disposition: Home  Recommendations for Outpatient Follow-up:  1. Follow up with PCP in 1 week 2. Please obtain BMP/CBC in one week to monitor renal function and hemoglobin  Discharge Condition: Stable CODE STATUS: Full code Diet recommendation:  Carb Modified   Brief/Interim Summary: For full details see H&P/Progress note, but in brief, [name] is a 32 year old female with medical history of type 1 diabetes mellitus presented to the emergency department complaining of weakness and malaise.  Prior to admission she was having URI symptoms and was unable to control her glucose.  Upon ED evaluation glucose was found to be 388 with anion gap of 22 and bicarb of 10.  Patient was admitted with working diagnosis of DKA  Subjective: Patient seen and examined, feeling much better today.  CBGs remain slight elevated.  Anion gap is closed.  Tolerating diet well.  Denies chest pain, shortness of breath and dizziness.  Discharge Diagnoses/Hospital Course:  DKA in setting of diabetes mellitus type 1 Felt to be secondary to URI.  Patient report good compliance with insulin.  A1c 11.5 Patient was treated with insulin drip, transitioned to subcutaneous insulin. Patient on sliding scale with NovoLog at home.  Also taking NPH for basal.  Patient will be discharged with adjustment on NPH.  Take 20 units twice daily.  Continue NovoLog sliding scale.   Follow-up with PCP in 1 week.  Low-carb diet and exercise advised.  Mild AKI due to dehydration  Resolved with IVF. Encourage oral hydration.  Check BMP in 1 week   On the day of the discharge the patient's vitals were stable, and no other acute medical condition were reported by patient. the patient was felt safe to be discharge to home   Discharge  Instructions  You were cared for by a hospitalist during your hospital stay. If you have any questions about your discharge medications or the care you received while you were in the hospital after you are discharged, you can call the unit and asked to speak with the hospitalist on call if the hospitalist that took care of you is not available. Once you are discharged, your primary care physician will handle any further medical issues. Please note that NO REFILLS for any discharge medications will be authorized once you are discharged, as it is imperative that you return to your primary care physician (or establish a relationship with a primary care physician if you do not have one) for your aftercare needs so that they can reassess your need for medications and monitor your lab values.  Discharge Instructions    Call MD for:  difficulty breathing, headache or visual disturbances   Complete by:  As directed    Call MD for:  extreme fatigue   Complete by:  As directed    Call MD for:  hives   Complete by:  As directed    Call MD for:  persistant dizziness or light-headedness   Complete by:  As directed    Call MD for:  persistant nausea and vomiting   Complete by:  As directed    Call MD for:  redness, tenderness, or signs of infection (pain, swelling, redness, odor or green/yellow discharge around incision site)   Complete by:  As directed    Call MD for:  severe uncontrolled pain  Complete by:  As directed    Call MD for:  temperature >100.4   Complete by:  As directed    Diet - low sodium heart healthy   Complete by:  As directed    Increase activity slowly   Complete by:  As directed      Allergies as of 12/28/2017   No Known Allergies     Medication List    STOP taking these medications   doxycycline 100 MG capsule Commonly known as:  VIBRAMYCIN   HYDROcodone-acetaminophen 5-325 MG tablet Commonly known as:  NORCO/VICODIN   insulin detemir 100 unit/ml Soln Commonly known  as:  LEVEMIR     TAKE these medications   glucose blood test strip Commonly known as:  TRUE METRIX BLOOD GLUCOSE TEST Use as instructed   insulin aspart 100 UNIT/ML injection Commonly known as:  novoLOG Inject 0-20 Units into the skin 3 (three) times daily before meals.   insulin NPH Human 100 UNIT/ML injection Commonly known as:  HUMULIN N,NOVOLIN N Inject 0.2 mLs (20 Units total) into the skin 2 (two) times daily before a meal. What changed:  when to take this   INSULIN SYRINGE .5CC/29G 29G X 1/2" 0.5 ML Misc 1 each by Does not apply route 4 (four) times daily - after meals and at bedtime.   Lancet Device Misc 1 each by Does not apply route 4 (four) times daily - after meals and at bedtime.   TRUE METRIX AIR GLUCOSE METER Devi 1 each by Does not apply route 4 (four) times daily -  with meals and at bedtime.      Follow-up Information    [name], FNP. Schedule an appointment as soon as possible for a visit in 1 week(s).   Specialty:  Family Medicine Why:  Hospital follow up  Contact information: [place] [place] [address] [phone]          No Known Allergies  Consultations:   Procedures/Studies: No results found.    Discharge Exam: Vitals:   12/28/17 0554 12/28/17 0925  BP: 118/80 112/77  Pulse: 81 93  Resp: 16 17  Temp: 98.2 F (36.8 C) 98.3 F (36.8 C)  SpO2: 100% 100%   Vitals:   12/27/17 2213 12/28/17 0216 12/28/17 0554 12/28/17 0925  BP: 116/81 102/74 118/80 112/77  Pulse: 92 85 81 93  Resp: 16 16 16 17   Temp: 98.3 F (36.8 C) 98.1 F (36.7 C) 98.2 F (36.8 C) 98.3 F (36.8 C)  TempSrc: Oral Oral Oral Oral  SpO2: 100% 100% 100% 100%  Weight:   64.8 kg (142 lb 13.7 oz)   Height:       General: Pt is alert, awake, not in acute distress Cardiovascular: RRR, S1/S2 +, no rubs, no gallops Respiratory: CTA bilaterally, no wheezing, no rhonchi Abdominal: Soft, NT, ND, bowel sounds + Extremities: no edema,  no cyanosis   The results of significant diagnostics from this hospitalization (including imaging, microbiology, ancillary and laboratory) are listed below for reference.     Microbiology: Recent Results (from the past 240 hour(s))  MRSA PCR Screening     Status: None   Collection Time: 12/26/17  2:34 PM  Result Value Ref Range Status   MRSA by PCR NEGATIVE NEGATIVE Final    Comment:        The GeneXpert MRSA Assay (FDA approved for NASAL specimens only), is one component of a comprehensive MRSA colonization surveillance program. It is not  intended to diagnose MRSA infection nor to guide or monitor treatment for MRSA infections. Performed at [facility], [place] [address], [place], [place] [address]      Labs: BNP (last 3 results) No results for input(s): BNP in the last 8760 hours. Basic Metabolic Panel: Recent Labs  Lab 12/27/17 0004 12/27/17 0329 12/27/17 0648 12/27/17 1200 12/28/17 0528  NA 134* 134* 132* 133* 132*  K 3.6 3.8 3.6 3.9 4.1  CL 111 109 109 108 104  CO2 15* 13* 14* 16* 18*  GLUCOSE 133* 167* 186* 265* 311*  BUN 10 9 8 12 15   CREATININE 0.66 0.57 0.66 0.68 0.58  CALCIUM 8.3* 8.3* 8.3* 8.6* 8.3*  MG  --   --   --   --  1.7   Liver Function Tests: No results for input(s): AST, ALT, ALKPHOS, BILITOT, PROT, ALBUMIN in the last 168 hours. No results for input(s): LIPASE, AMYLASE in the last 168 hours. No results for input(s): AMMONIA in the last 168 hours. CBC: Recent Labs  Lab 12/26/17 1100 12/28/17 0528  WBC 5.2 5.8  HGB 15.9* 12.0  HCT 48.0* 36.1  MCV 87.4 83.8  PLT 326 273   Cardiac Enzymes: No results for input(s): CKTOTAL, CKMB, CKMBINDEX, TROPONINI in the last 168 hours. BNP: Invalid input(s): POCBNP CBG: Recent Labs  Lab 12/27/17 0751 12/27/17 1155 12/27/17 1647 12/27/17 1941 12/28/17 0724  GLUCAP 159* 258* 111* 287* 283*   D-Dimer No results for input(s): DDIMER in the last 72 hours. Hgb A1c Recent  Labs    12/27/17 0326  HGBA1C 11.5*   Lipid Profile No results for input(s): CHOL, HDL, LDLCALC, TRIG, CHOLHDL, LDLDIRECT in the last 72 hours. Thyroid function studies No results for input(s): TSH, T4TOTAL, T3FREE, THYROIDAB in the last 72 hours.  Invalid input(s): FREET3 Anemia work up No results for input(s): VITAMINB12, FOLATE, FERRITIN, TIBC, IRON, RETICCTPCT in the last 72 hours. Urinalysis    Component Value Date/Time   COLORURINE STRAW (A) 12/26/2017 1217   APPEARANCEUR CLEAR 12/26/2017 1217   LABSPEC 1.026 12/26/2017 1217   PHURINE 5.0 12/26/2017 1217   GLUCOSEU >=500 (A) 12/26/2017 1217   HGBUR NEGATIVE 12/26/2017 1217   BILIRUBINUR NEGATIVE 12/26/2017 1217   KETONESUR 80 (A) 12/26/2017 1217   PROTEINUR 30 (A) 12/26/2017 1217   UROBILINOGEN 0.2 08/07/2016 1610   NITRITE NEGATIVE 12/26/2017 1217   LEUKOCYTESUR NEGATIVE 12/26/2017 1217   Sepsis Labs Invalid input(s): PROCALCITONIN,  WBC,  LACTICIDVEN Microbiology Recent Results (from the past 240 hour(s))  MRSA PCR Screening     Status: None   Collection Time: 12/26/17  2:34 PM  Result Value Ref Range Status   MRSA by PCR NEGATIVE NEGATIVE Final    Comment:        The GeneXpert MRSA Assay (FDA approved for NASAL specimens only), is one component of a comprehensive MRSA colonization surveillance program. It is not intended to diagnose MRSA infection nor to guide or monitor treatment for MRSA infections. Performed at [facility], [place] [address] [place], [place] [address]      Time coordinating discharge: 32 minutes  SIGNED:  Chipper Oman, MD  [facility] 12/28/2017, 11:13 AM  Pager please text page via  [email or web address]  Note - This record has been created using [employer]. Chart creation errors have been sought, but may not always have been located. Such creation errors do not reflect on the standard of medical care.

## 2018-04-15 ENCOUNTER — Encounter (HOSPITAL_COMMUNITY): Payer: Self-pay

## 2018-04-15 ENCOUNTER — Emergency Department (HOSPITAL_COMMUNITY): Payer: 59

## 2018-04-15 ENCOUNTER — Inpatient Hospital Stay (HOSPITAL_COMMUNITY)
Admission: EM | Admit: 2018-04-15 | Discharge: 2018-04-18 | DRG: 639 | Disposition: A | Discharge status: Home / Self Care | Payer: 59 | Attending: Internal Medicine | Admitting: Internal Medicine

## 2018-04-15 DIAGNOSIS — Z833 Family history of diabetes mellitus: Secondary | ICD-10-CM

## 2018-04-15 DIAGNOSIS — R0781 Pleurodynia: Secondary | ICD-10-CM | POA: Diagnosis present

## 2018-04-15 DIAGNOSIS — Z79899 Other long term (current) drug therapy: Secondary | ICD-10-CM | POA: Diagnosis not present

## 2018-04-15 DIAGNOSIS — E1010 Type 1 diabetes mellitus with ketoacidosis without coma: Principal | ICD-10-CM | POA: Diagnosis present

## 2018-04-15 DIAGNOSIS — K5909 Other constipation: Secondary | ICD-10-CM | POA: Diagnosis present

## 2018-04-15 DIAGNOSIS — E10649 Type 1 diabetes mellitus with hypoglycemia without coma: Secondary | ICD-10-CM | POA: Diagnosis present

## 2018-04-15 DIAGNOSIS — E876 Hypokalemia: Secondary | ICD-10-CM | POA: Diagnosis present

## 2018-04-15 DIAGNOSIS — Z794 Long term (current) use of insulin: Secondary | ICD-10-CM | POA: Diagnosis not present

## 2018-04-15 DIAGNOSIS — K029 Dental caries, unspecified: Secondary | ICD-10-CM | POA: Diagnosis present

## 2018-04-15 DIAGNOSIS — E0810 Diabetes mellitus due to underlying condition with ketoacidosis without coma: Secondary | ICD-10-CM | POA: Diagnosis present

## 2018-04-15 DIAGNOSIS — E875 Hyperkalemia: Secondary | ICD-10-CM | POA: Diagnosis present

## 2018-04-15 DIAGNOSIS — Z808 Family history of malignant neoplasm of other organs or systems: Secondary | ICD-10-CM | POA: Diagnosis not present

## 2018-04-15 DIAGNOSIS — Z7722 Contact with and (suspected) exposure to environmental tobacco smoke (acute) (chronic): Secondary | ICD-10-CM | POA: Diagnosis present

## 2018-04-15 DIAGNOSIS — F411 Generalized anxiety disorder: Secondary | ICD-10-CM | POA: Diagnosis present

## 2018-04-15 DIAGNOSIS — E1110 Type 2 diabetes mellitus with ketoacidosis without coma: Secondary | ICD-10-CM | POA: Diagnosis present

## 2018-04-15 LAB — BASIC METABOLIC PANEL
Anion gap: 23 — ABNORMAL HIGH (ref 5–15)
BUN: 17 mg/dL (ref 6–20)
CO2: <7 mmol/L — ABNORMAL LOW (ref 22–32)
Calcium: 9.2 mg/dL (ref 8.9–10.3)
Chloride: 102 mmol/L (ref 98–111)
Creatinine, Ser: 0.95 mg/dL (ref 0.44–1.00)
GFR calc Af Amer: >60 mL/min (ref >60)
GFR calc non Af Amer: >60 mL/min (ref >60)
Glucose, Bld: 401 mg/dL — ABNORMAL HIGH (ref 70–99)
Potassium: 5.8 mmol/L — ABNORMAL HIGH (ref 3.5–5.1)
Sodium: 131 mmol/L — ABNORMAL LOW (ref 135–145)

## 2018-04-15 LAB — HEPATIC FUNCTION PANEL
ALT: 16 U/L (ref 0–44)
AST: 16 U/L (ref 15–41)
Albumin: 4.3 g/dL (ref 3.5–5.0)
Alkaline Phosphatase: 55 U/L (ref 38–126)
Bilirubin, Direct: 0.2 mg/dL (ref 0.0–0.2)
Indirect Bilirubin: 1.4 mg/dL — ABNORMAL HIGH (ref 0.3–0.9)
Total Bilirubin: 1.6 mg/dL — ABNORMAL HIGH (ref 0.3–1.2)
Total Protein: 8.1 g/dL (ref 6.5–8.1)

## 2018-04-15 LAB — CBC WITH DIFFERENTIAL/PLATELET
Basophils Absolute: 0 10*3/uL (ref 0.0–0.1)
Basophils Relative: 0 %
Eosinophils Absolute: 0 10*3/uL (ref 0.0–0.7)
Eosinophils Relative: 0 %
HCT: 42.5 % (ref 36.0–46.0)
Hemoglobin: 13.8 g/dL (ref 12.0–15.0)
Lymphocytes Relative: 19 %
Lymphs Abs: 1.7 10*3/uL (ref 0.7–4.0)
MCH: 28.5 pg (ref 26.0–34.0)
MCHC: 32.5 g/dL (ref 30.0–36.0)
MCV: 87.8 fL (ref 78.0–100.0)
Monocytes Absolute: 0.6 10*3/uL (ref 0.1–1.0)
Monocytes Relative: 6 %
Neutro Abs: 6.7 10*3/uL (ref 1.7–7.7)
Neutrophils Relative %: 75 %
Platelets: 364 10*3/uL (ref 150–400)
RBC: 4.84 MIL/uL (ref 3.87–5.11)
RDW: 13.2 % (ref 11.5–15.5)
WBC: 9 10*3/uL (ref 4.0–10.5)

## 2018-04-15 LAB — LIPASE, BLOOD: Lipase: 19 U/L (ref 11–51)

## 2018-04-15 LAB — I-STAT TROPONIN, ED: Troponin i, poc: 0 ng/mL (ref 0.00–0.08)

## 2018-04-15 LAB — CBG MONITORING, ED
Glucose-Capillary: 298 mg/dL — ABNORMAL HIGH (ref 70–99)
Glucose-Capillary: 438 mg/dL — ABNORMAL HIGH (ref 70–99)

## 2018-04-15 LAB — POTASSIUM: Potassium: 5.8 mmol/L — ABNORMAL HIGH (ref 3.5–5.1)

## 2018-04-15 LAB — I-STAT BETA HCG BLOOD, ED (MC, WL, AP ONLY): I-stat hCG, quantitative: <5 m[IU]/mL (ref <5)

## 2018-04-15 LAB — D-DIMER, QUANTITATIVE (NOT AT ARMC): D-Dimer, Quant: 1.09 ug/mL-FEU — ABNORMAL HIGH (ref 0.00–0.50)

## 2018-04-15 MED ORDER — IOPAMIDOL (ISOVUE-370) INJECTION 76%
INTRAVENOUS | Status: AC
  Filled 2018-04-15: qty 100

## 2018-04-15 MED ORDER — INSULIN REGULAR BOLUS VIA INFUSION
0.0000 [IU] | Freq: Three times a day (TID) | INTRAVENOUS | Status: DC
  Filled 2018-04-15: qty 10

## 2018-04-15 MED ORDER — SODIUM CHLORIDE 0.9 % IV SOLN
INTRAVENOUS | Status: DC
  Administered 2018-04-15: 2.4 [IU]/h via INTRAVENOUS
  Filled 2018-04-15 (×2): qty 1

## 2018-04-15 MED ORDER — SODIUM CHLORIDE 0.9 % IV BOLUS: 1000.0000 mL | Freq: Once | INTRAVENOUS | Status: DC

## 2018-04-15 MED ORDER — DEXTROSE-NACL 5-0.45 % IV SOLN: INTRAVENOUS | Status: DC

## 2018-04-15 MED ORDER — SODIUM CHLORIDE 0.9 % IV SOLN: INTRAVENOUS | Status: DC

## 2018-04-15 MED ORDER — DEXTROSE 50 % IV SOLN: 25.0000 mL | INTRAVENOUS | Status: DC | PRN

## 2018-04-15 MED ORDER — SODIUM CHLORIDE 0.9 % IV BOLUS
2000.0000 mL | Freq: Once | INTRAVENOUS | Status: AC
  Administered 2018-04-15: 2000 mL via INTRAVENOUS

## 2018-04-15 MED ORDER — HYDROMORPHONE HCL 1 MG/ML IJ SOLN
0.5000 mg | Freq: Once | INTRAMUSCULAR | Status: AC
  Administered 2018-04-15: 0.5 mg via INTRAVENOUS
  Filled 2018-04-15: qty 1

## 2018-04-15 MED ORDER — IOPAMIDOL (ISOVUE-370) INJECTION 76%
100.0000 mL | Freq: Once | INTRAVENOUS | Status: AC | PRN
  Administered 2018-04-15: 100 mL via INTRAVENOUS

## 2018-04-15 NOTE — ED Notes (Signed)
Attempt IV x2. Asking Zenia Resides, RN to assist in finding a line.

## 2018-04-15 NOTE — ED Provider Notes (Signed)
[facility] DEPT Provider Note   CSN: [id number] Arrival date & time: 04/15/18  1847     History   Chief Complaint Chief Complaint  Patient presents with  . Chest Pain    HPI [name] is a 32 y.o. female.  Patient complains of some chest pain and epigastric pain she is thrown up some.  Patient states that she is concerned she is in DKA.  The pain is pleuritic in her chest  The history is provided by the patient. No language interpreter was used.  Chest Pain   This is a new problem. The current episode started 2 days ago. The problem occurs constantly. The problem has not changed since onset.The pain is associated with breathing. The pain is present in the substernal region. The pain is at a severity of 3/10. The pain is moderate. The quality of the pain is described as pleuritic. The pain does not radiate. The symptoms are aggravated by deep breathing. Associated symptoms include abdominal pain and vomiting. Pertinent negatives include no back pain, no cough and no headaches.  Pertinent negatives for past medical history include no seizures.    Past Medical History:  Diagnosis Date  . Diabetes mellitus without complication [facility])     Patient Active Problem List   Diagnosis Date Noted  . Refusal of blood transfusion for reasons of conscience 12/26/2017  . DKA, type 1 ([place]) 05/19/2017  . Colitis   . Prolonged QT interval   . Hyperpigmentation of skin, postinflammatory 08/07/2016  . Dizziness 04/25/2016  . DKA (diabetic ketoacidoses) ([place]) 04/25/2016  . Rhinitis, allergic 10/11/2015  . Diabetes type 1, uncontrolled ([place]) 07/25/2015  . Cephalalgia 07/25/2015  . Generalized anxiety disorder 07/25/2015  . Depression 07/25/2015  . Diabetic ketoacidosis without coma associated with type 1 diabetes mellitus ([place]) 07/11/2012  . Leukocytosis 07/11/2012  . Hyperkalemia 07/11/2012  . High anion gap metabolic acidosis 61/44/3154    History  reviewed. No pertinent surgical history.   OB History   None      Home Medications    Prior to Admission medications   Medication Sig Start Date End Date Taking? Authorizing Provider  guaiFENesin (MUCINEX) 600 MG 12 hr tablet Take 600 mg by mouth 2 (two) times daily.   Yes [provider]  insulin aspart (NOVOLOG) 100 UNIT/ML injection Inject 0-20 Units into the skin 3 (three) times daily before meals. 08/07/16  Yes [name], FNP  insulin NPH Human (HUMULIN N,NOVOLIN N) 100 UNIT/ML injection Inject 0.2 mLs (20 Units total) into the skin 2 (two) times daily before a meal. Patient taking differently: Inject 22 Units into the skin 2 (two) times daily before a meal.  12/28/17  Yes Patrecia Pour, Christean Grief, MD  Blood Glucose Monitoring Suppl (TRUE METRIX AIR GLUCOSE METER) DEVI 1 each by Does not apply route 4 (four) times daily -  with meals and at bedtime. 07/25/15   [name], FNP  glucose blood (TRUE METRIX BLOOD GLUCOSE TEST) test strip Use as instructed 07/25/15   [name], FNP  INSULIN SYRINGE .5CC/29G 29G X 1/2" 0.5 ML MISC 1 each by Does not apply route 4 (four) times daily - after meals and at bedtime. 07/25/15   [name], FNP  Lancet Device MISC 1 each by Does not apply route 4 (four) times daily - after meals and at bedtime. 07/25/15   [name], FNP    Family History Family History  Problem Relation Age  of Onset  . Throat cancer Mother   . Diabetes Maternal Aunt     Social History Social History   Tobacco Use  . Smoking status: Passive Smoke Exposure - Never Smoker  . Smokeless tobacco: Never Used  Substance Use Topics  . Alcohol use: Yes    Comment: occasionally   . Drug use: No     Allergies   Patient has no known allergies.   Review of Systems Review of Systems  Constitutional: Negative for appetite change and fatigue.  HENT: Negative for congestion, ear discharge and sinus pressure.   Eyes: Negative for  discharge.  Respiratory: Negative for cough.   Cardiovascular: Positive for chest pain.  Gastrointestinal: Positive for abdominal pain and vomiting. Negative for diarrhea.  Genitourinary: Negative for frequency and hematuria.  Musculoskeletal: Negative for back pain.  Skin: Negative for rash.  Neurological: Negative for seizures and headaches.  Psychiatric/Behavioral: Negative for hallucinations.     Physical Exam Updated Vital Signs BP 129/86   Pulse (!) 106   Temp 98.3 F (36.8 C) (Oral)   Resp 16   Ht 5\' 6"  (1.676 m)   Wt 70.3 kg   LMP 04/08/2018 (Approximate)   SpO2 98%   BMI 25.02 kg/m   Physical Exam  Constitutional: She is oriented to person, place, and time. She appears well-developed.  HENT:  Head: Normocephalic.  Eyes: Conjunctivae and EOM are normal. No scleral icterus.  Neck: Neck supple. No thyromegaly present.  Cardiovascular: Normal rate and regular rhythm. Exam reveals no gallop and no friction rub.  No murmur heard. Pulmonary/Chest: No stridor. She has no wheezes. She has no rales. She exhibits no tenderness.  Abdominal: She exhibits no distension. There is tenderness. There is no rebound.  Mild epigastric tenderness  Musculoskeletal: Normal range of motion. She exhibits no edema.  Lymphadenopathy:    She has no cervical adenopathy.  Neurological: She is oriented to person, place, and time. She exhibits normal muscle tone. Coordination normal.  Skin: No rash noted. No erythema.  Psychiatric: She has a normal mood and affect. Her behavior is normal.     ED Treatments / Results  Labs (all labs ordered are listed, but only abnormal results are displayed) Labs Reviewed  BASIC METABOLIC PANEL - Abnormal; Notable for the following components:      Result Value   Sodium 131 (*)    Potassium 5.8 (*)    CO2 <7 (*)    Glucose, Bld 401 (*)    Anion gap 23 (*)    All other components within normal limits  HEPATIC FUNCTION PANEL - Abnormal; Notable for  the following components:   Total Bilirubin 1.6 (*)    Indirect Bilirubin 1.4 (*)    All other components within normal limits  D-DIMER, QUANTITATIVE (NOT AT [facility]) - Abnormal; Notable for the following components:   D-Dimer, Quant 1.09 (*)    All other components within normal limits  CBG MONITORING, ED - Abnormal; Notable for the following components:   Glucose-Capillary 438 (*)    All other components within normal limits  LIPASE, BLOOD  CBC WITH DIFFERENTIAL/PLATELET  POTASSIUM  I-STAT TROPONIN, ED  I-STAT BETA HCG BLOOD, ED ([facility], [facility], [facility] ONLY)    EKG EKG Interpretation  Date/Time:  Thursday April 15 2018 18:53:22 EDT Ventricular Rate:  133 PR Interval:    QRS Duration: 78 QT Interval:  305 QTC Calculation: 454 R Axis:   -28 Text Interpretation:  Sinus tachycardia Borderline left axis deviation Low  voltage, precordial leads Abnormal inferior Q waves Consider anterior infarct Confirmed by Milton Ferguson [phone]) on 04/15/2018 9:50:46 PM   Radiology Dg Chest 2 View  Result Date: 04/15/2018 CLINICAL DATA:  Chest pain EXAM: CHEST - 2 VIEW COMPARISON:  06/01/2017 FINDINGS: The heart size and mediastinal contours are within normal limits. Both lungs are clear. The visualized skeletal structures are unremarkable. IMPRESSION: No active cardiopulmonary disease. Electronically Signed   By: Donavan Foil M.D.   On: 04/15/2018 19:29   Ct Angio Chest Pe W And/or Wo Contrast  Result Date: 04/15/2018 CLINICAL DATA:  Chest pain and shortness of breath for 3 days. EXAM: CT ANGIOGRAPHY CHEST WITH CONTRAST TECHNIQUE: Multidetector CT imaging of the chest was performed using the standard protocol during bolus administration of intravenous contrast. Multiplanar CT image reconstructions and MIPs were obtained to evaluate the vascular anatomy. CONTRAST:  172mL ISOVUE-370 IOPAMIDOL (ISOVUE-370) INJECTION 76% COMPARISON:  Chest radiograph April 15, 2018 and CT chest April 11, 2014 FINDINGS:  CARDIOVASCULAR: Adequate contrast opacification of the pulmonary artery's. Main pulmonary artery is not enlarged. No pulmonary arterial filling defects to the level of the subsegmental branches. Heart size is normal, no right heart strain. No pericardial effusion. Thoracic aorta is normal course and caliber, unremarkable. MEDIASTINUM/NODES: No lymphadenopathy by CT size criteria. Small amount of similar residual thymic tissue. LUNGS/PLEURA: Tracheobronchial tree is patent, no pneumothorax. No pleural effusions, focal consolidations, pulmonary nodules or masses. UPPER ABDOMEN: Non-acute.  Mild suspected hepatic steatosis. MUSCULOSKELETAL: Non-acute.  Bilateral breast piercings. Review of the MIP images confirms the above findings. IMPRESSION: Negative CT angiogram chest. Electronically Signed   By: Elon Alas M.D.   On: 04/15/2018 22:33    Procedures Procedures (including critical care time)  Medications Ordered in ED Medications  iopamidol (ISOVUE-370) 76 % injection (has no administration in time range)  dextrose 5 %-0.45 % sodium chloride infusion (has no administration in time range)  insulin regular bolus via infusion 0-10 Units (has no administration in time range)  insulin regular (NOVOLIN R,HUMULIN R) 100 Units in sodium chloride 0.9 % 100 mL (1 Units/mL) infusion (has no administration in time range)  dextrose 50 % solution 25 mL (has no administration in time range)  0.9 %  sodium chloride infusion (has no administration in time range)  sodium chloride 0.9 % bolus 1,000 mL (has no administration in time range)  sodium chloride 0.9 % bolus 2,000 mL (2,000 mLs Intravenous New Bag/Given 04/15/18 2019)  HYDROmorphone (DILAUDID) injection 0.5 mg (0.5 mg Intravenous Given 04/15/18 2019)  iopamidol (ISOVUE-370) 76 % injection 100 mL (100 mLs Intravenous Contrast Given 04/15/18 2214)     Initial Impression / Assessment and Plan / ED Course  I have reviewed the triage vital signs and the  nursing notes.  Pertinent labs & imaging results that were available during my care of the patient were reviewed by me and considered in my medical decision making (see chart for details).     CRITICAL CARE Performed by: Milton Ferguson Total critical care time:62minutes Critical care time was exclusive of separately billable procedures and treating other patients. Critical care was necessary to treat or prevent imminent or life-threatening deterioration. Critical care was time spent personally by me on the following activities: development of treatment plan with patient and/or surrogate as well as nursing, discussions with consultants, evaluation of patient's response to treatment, examination of patient, obtaining history from patient or surrogate, ordering and performing treatments and interventions, ordering and review of laboratory studies, ordering and review  of radiographic studies, pulse oximetry and re-evaluation of patient's condition.  Patient is in DKA.  She will be placed on a glucose stabilizer and admitted to medicine Final Clinical Impressions(s) / ED Diagnoses   Final diagnoses:  None    ED Discharge Orders    None       Milton Ferguson, MD 04/15/18 2255

## 2018-04-15 NOTE — ED Triage Notes (Signed)
Pt presents with c/o chest pain that started approx 3 days ago. Pt reports that in the past 2 days, her pain has gotten progressively worse. Pt also reports elevated blood sugars. Pt is concerned that she is going into DKA.

## 2018-04-15 NOTE — H&P (Signed)
[facility]                                                                                    Patient Demographics  [name], is a 32 y.o. female  CSN: [id number]  MRN: [id number]  DOB - 01/10/1986  Admit Date - 04/15/2018  Outpatient Primary MD for the patient is [name], FNP   With History of -  Past Medical History:  Diagnosis Date  . Diabetes mellitus without complication ([place])       History reviewed. No pertinent surgical history.  in for   Chief Complaint  Patient presents with  . Chest Pain     HPI  [name]  is a 32 y.o. 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  No history of fever or chills.  Patient reports history of anxiety and increased mental stressors lately.  Patient has been compliant with her insulin.    Review of Systems    In addition to the HPI above,  No Fever-chills, No Headache, No changes with Vision or hearing, No problems swallowing food or Liquids, No Cough or Shortness of Breath, No Blood in stool or Urine, No dysuria, No new skin rashes or bruises, No new joints pains-aches,  No new weakness, tingling, numbness in any extremity, No recent weight gain or loss, No polyuria, polydypsia or polyphagia,  A full 10 point Review of Systems was done, except as stated above, all other Review of Systems were negative.   Social History Social History   Tobacco Use  . Smoking status: Passive Smoke Exposure - Never Smoker  . Smokeless tobacco: Never Used  Substance Use Topics  . Alcohol use: Yes    Comment: occasionally      Family History Family History  Problem Relation Age of Onset  . Throat cancer Mother   . Diabetes Maternal Aunt      Prior to Admission medications   Medication Sig Start Date End Date Taking?  Authorizing Provider  guaiFENesin (MUCINEX) 600 MG 12 hr tablet Take 600 mg by mouth 2 (two) times daily.   Yes [provider]  insulin aspart (NOVOLOG) 100 UNIT/ML injection Inject 0-20 Units into the skin 3 (three) times daily before meals. 08/07/16  Yes [name], FNP  insulin NPH Human (HUMULIN N,NOVOLIN N) 100 UNIT/ML injection Inject 0.2 mLs (20 Units total) into the skin 2 (two) times daily before a meal. Patient taking differently: Inject 22 Units into the skin 2 (two) times daily before a meal.  12/28/17  Yes Patrecia Pour, Christean Grief, MD  Blood Glucose Monitoring Suppl (TRUE METRIX AIR GLUCOSE METER) DEVI 1 each by Does not apply route 4 (four) times daily -  with meals and at bedtime. 07/25/15   [name], FNP  glucose blood (TRUE METRIX BLOOD GLUCOSE TEST) test strip Use as instructed 07/25/15   [name], FNP  INSULIN SYRINGE .5CC/29G 29G X 1/2" 0.5 ML MISC 1 each by Does not apply route 4 (four)  times daily - after meals and at bedtime. 07/25/15   [name], FNP  Lancet Device MISC 1 each by Does not apply route 4 (four) times daily - after meals and at bedtime. 07/25/15   [name], FNP    No Known Allergies  Physical Exam  Vitals  Blood pressure 129/86, pulse (!) 106, temperature 98.3 F (36.8 C), temperature source Oral, resp. rate 16, height 5\' 6"  (1.676 m), weight 70.3 kg, last menstrual period 04/08/2018, SpO2 98 %.   1. General Young female, looks anxious  2. Normal affect and insight, Not Suicidal or Homicidal, Awake Alert, Oriented X 3.  3. No F.N deficits, grossly, patient moving all extremities.  4. Ears and Eyes appear Normal, Conjunctivae clear, PERRLA. Moist Oral Mucosa.  5. Supple Neck, No JVD, No cervical lymphadenopathy appriciated, No Carotid Bruits.  6. Symmetrical Chest wall movement, Good air movement bilaterally, CTAB.  7. RRR, No Gallops, Rubs or Murmurs, No Parasternal Heave.  8. Positive Bowel Sounds,  Abdomen Soft, Non tender, No organomegaly appriciated,No rebound -guarding or rigidity.  9.  No Cyanosis, Normal Skin Turgor, No Skin Rash or Bruise.  10. Good muscle tone,  joints appear normal , no effusions, Normal ROM.    Data Review  CBC Recent Labs  Lab 04/15/18 0857  WBC 9.0  HGB 13.8  HCT 42.5  PLT 364  MCV 87.8  MCH 28.5  MCHC 32.5  RDW 13.2  LYMPHSABS 1.7  MONOABS 0.6  EOSABS 0.0  BASOSABS 0.0   ------------------------------------------------------------------------------------------------------------------  Chemistries  Recent Labs  Lab 04/15/18 1902 04/15/18 1928 04/15/18 2243  NA 131*  --   --   K 5.8*  --  5.8*  CL 102  --   --   CO2 <7*  --   --   GLUCOSE 401*  --   --   BUN 17  --   --   CREATININE 0.95  --   --   CALCIUM 9.2  --   --   AST  --  16  --   ALT  --  16  --   ALKPHOS  --  55  --   BILITOT  --  1.6*  --    ------------------------------------------------------------------------------------------------------------------ estimated creatinine clearance is 80.3 mL/min (by C-G formula based on SCr of 0.95 mg/dL). ------------------------------------------------------------------------------------------------------------------ No results for input(s): TSH, T4TOTAL, T3FREE, THYROIDAB in the last 72 hours.  Invalid input(s): FREET3   Coagulation profile No results for input(s): INR, PROTIME in the last 168 hours. ------------------------------------------------------------------------------------------------------------------- Recent Labs    04/15/18 1928  DDIMER 1.09*   -------------------------------------------------------------------------------------------------------------------  Cardiac Enzymes No results for input(s): CKMB, TROPONINI, MYOGLOBIN in the last 168 hours.  Invalid input(s): CK ------------------------------------------------------------------------------------------------------------------ Invalid  input(s): POCBNP   ---------------------------------------------------------------------------------------------------------------  Urinalysis    Component Value Date/Time   COLORURINE STRAW (A) 12/26/2017 1217   APPEARANCEUR CLEAR 12/26/2017 1217   LABSPEC 1.026 12/26/2017 1217   PHURINE 5.0 12/26/2017 1217   GLUCOSEU >=500 (A) 12/26/2017 1217   HGBUR NEGATIVE 12/26/2017 1217   BILIRUBINUR NEGATIVE 12/26/2017 1217   KETONESUR 80 (A) 12/26/2017 1217   PROTEINUR 30 (A) 12/26/2017 1217   UROBILINOGEN 0.2 08/07/2016 1610   NITRITE NEGATIVE 12/26/2017 1217   LEUKOCYTESUR NEGATIVE 12/26/2017 1217    ----------------------------------------------------------------------------------------------------------------   Imaging results:   Dg Chest 2 View  Result Date: 04/15/2018 CLINICAL DATA:  Chest pain EXAM: CHEST - 2 VIEW COMPARISON:  06/01/2017 FINDINGS: The heart size and mediastinal contours are within normal  limits. Both lungs are clear. The visualized skeletal structures are unremarkable. IMPRESSION: No active cardiopulmonary disease. Electronically Signed   By: Donavan Foil M.D.   On: 04/15/2018 19:29   Ct Angio Chest Pe W And/or Wo Contrast  Result Date: 04/15/2018 CLINICAL DATA:  Chest pain and shortness of breath for 3 days. EXAM: CT ANGIOGRAPHY CHEST WITH CONTRAST TECHNIQUE: Multidetector CT imaging of the chest was performed using the standard protocol during bolus administration of intravenous contrast. Multiplanar CT image reconstructions and MIPs were obtained to evaluate the vascular anatomy. CONTRAST:  14mL ISOVUE-370 IOPAMIDOL (ISOVUE-370) INJECTION 76% COMPARISON:  Chest radiograph April 15, 2018 and CT chest April 11, 2014 FINDINGS: CARDIOVASCULAR: Adequate contrast opacification of the pulmonary artery's. Main pulmonary artery is not enlarged. No pulmonary arterial filling defects to the level of the subsegmental branches. Heart size is normal, no right heart  strain. No pericardial effusion. Thoracic aorta is normal course and caliber, unremarkable. MEDIASTINUM/NODES: No lymphadenopathy by CT size criteria. Small amount of similar residual thymic tissue. LUNGS/PLEURA: Tracheobronchial tree is patent, no pneumothorax. No pleural effusions, focal consolidations, pulmonary nodules or masses. UPPER ABDOMEN: Non-acute.  Mild suspected hepatic steatosis. MUSCULOSKELETAL: Non-acute.  Bilateral breast piercings. Review of the MIP images confirms the above findings. IMPRESSION: Negative CT angiogram chest. Electronically Signed   By: Elon Alas M.D.   On: 04/15/2018 22:33    My personal review of EKG: Sinus tach at 133 bpm    Assessment & Plan  1.  Diabetic ketoacidosis     Glucose stabilizer    Continue with IV fluids  2.  Chest pain; pleuritic      CT angiogram negative for pulmonary embolism      No significant changes per EKG      Troponins  3.  Anxiety     Xanax as needed  4.  Hyperkalemia      Continue with IV fluids      Monitor   DVT Prophylaxis Lovenox  AM Labs Ordered, also please review Full Orders  Code Status full  Disposition Plan: Home  Time spent in minutes : 34 minutes  Condition GUARDED   @SIGNATURE @

## 2018-04-15 NOTE — ED Notes (Signed)
Attempted lab draw but unsuccessful. RN, Colletta Maryland made aware.

## 2018-04-16 ENCOUNTER — Other Ambulatory Visit: Payer: Self-pay

## 2018-04-16 DIAGNOSIS — R0781 Pleurodynia: Secondary | ICD-10-CM

## 2018-04-16 DIAGNOSIS — F411 Generalized anxiety disorder: Secondary | ICD-10-CM

## 2018-04-16 DIAGNOSIS — E1010 Type 1 diabetes mellitus with ketoacidosis without coma: Principal | ICD-10-CM

## 2018-04-16 LAB — GLUCOSE, CAPILLARY
Glucose-Capillary: 100 mg/dL — ABNORMAL HIGH (ref 70–99)
Glucose-Capillary: 108 mg/dL — ABNORMAL HIGH (ref 70–99)
Glucose-Capillary: 129 mg/dL — ABNORMAL HIGH (ref 70–99)
Glucose-Capillary: 131 mg/dL — ABNORMAL HIGH (ref 70–99)
Glucose-Capillary: 132 mg/dL — ABNORMAL HIGH (ref 70–99)
Glucose-Capillary: 139 mg/dL — ABNORMAL HIGH (ref 70–99)
Glucose-Capillary: 147 mg/dL — ABNORMAL HIGH (ref 70–99)
Glucose-Capillary: 154 mg/dL — ABNORMAL HIGH (ref 70–99)
Glucose-Capillary: 196 mg/dL — ABNORMAL HIGH (ref 70–99)
Glucose-Capillary: 203 mg/dL — ABNORMAL HIGH (ref 70–99)
Glucose-Capillary: 234 mg/dL — ABNORMAL HIGH (ref 70–99)
Glucose-Capillary: 91 mg/dL (ref 70–99)
Glucose-Capillary: 92 mg/dL (ref 70–99)

## 2018-04-16 LAB — BASIC METABOLIC PANEL
Anion gap: 15 (ref 5–15)
Anion gap: 8 (ref 5–15)
Anion gap: 9 (ref 5–15)
Anion gap: 12 (ref 5–15)
Anion gap: 10 (ref 5–15)
BUN: 15 mg/dL (ref 6–20)
BUN: 12 mg/dL (ref 6–20)
BUN: 11 mg/dL (ref 6–20)
BUN: 9 mg/dL (ref 6–20)
BUN: 13 mg/dL (ref 6–20)
CO2: 12 mmol/L — ABNORMAL LOW (ref 22–32)
CO2: 14 mmol/L — ABNORMAL LOW (ref 22–32)
CO2: 16 mmol/L — ABNORMAL LOW (ref 22–32)
CO2: 16 mmol/L — ABNORMAL LOW (ref 22–32)
CO2: 18 mmol/L — ABNORMAL LOW (ref 22–32)
Calcium: 8.6 mg/dL — ABNORMAL LOW (ref 8.9–10.3)
Calcium: 7.9 mg/dL — ABNORMAL LOW (ref 8.9–10.3)
Calcium: 8.7 mg/dL — ABNORMAL LOW (ref 8.9–10.3)
Calcium: 8.7 mg/dL — ABNORMAL LOW (ref 8.9–10.3)
Calcium: 8.6 mg/dL — ABNORMAL LOW (ref 8.9–10.3)
Chloride: 105 mmol/L (ref 98–111)
Chloride: 113 mmol/L — ABNORMAL HIGH (ref 98–111)
Chloride: 113 mmol/L — ABNORMAL HIGH (ref 98–111)
Chloride: 109 mmol/L (ref 98–111)
Chloride: 108 mmol/L (ref 98–111)
Creatinine, Ser: 0.94 mg/dL (ref 0.44–1.00)
Creatinine, Ser: 0.7 mg/dL (ref 0.44–1.00)
Creatinine, Ser: 0.62 mg/dL (ref 0.44–1.00)
Creatinine, Ser: 0.67 mg/dL (ref 0.44–1.00)
Creatinine, Ser: 0.72 mg/dL (ref 0.44–1.00)
GFR calc Af Amer: >60 mL/min (ref >60)
GFR calc Af Amer: >60 mL/min (ref >60)
GFR calc Af Amer: >60 mL/min (ref >60)
GFR calc Af Amer: >60 mL/min (ref >60)
GFR calc Af Amer: >60 mL/min (ref >60)
GFR calc non Af Amer: >60 mL/min (ref >60)
GFR calc non Af Amer: >60 mL/min (ref >60)
GFR calc non Af Amer: >60 mL/min (ref >60)
GFR calc non Af Amer: >60 mL/min (ref >60)
GFR calc non Af Amer: >60 mL/min (ref >60)
Glucose, Bld: 263 mg/dL — ABNORMAL HIGH (ref 70–99)
Glucose, Bld: 141 mg/dL — ABNORMAL HIGH (ref 70–99)
Glucose, Bld: 99 mg/dL (ref 70–99)
Glucose, Bld: 200 mg/dL — ABNORMAL HIGH (ref 70–99)
Glucose, Bld: 200 mg/dL — ABNORMAL HIGH (ref 70–99)
Potassium: 4.8 mmol/L (ref 3.5–5.1)
Potassium: 4.5 mmol/L (ref 3.5–5.1)
Potassium: 4.4 mmol/L (ref 3.5–5.1)
Potassium: 4 mmol/L (ref 3.5–5.1)
Potassium: 4.1 mmol/L (ref 3.5–5.1)
Sodium: 132 mmol/L — ABNORMAL LOW (ref 135–145)
Sodium: 135 mmol/L (ref 135–145)
Sodium: 138 mmol/L (ref 135–145)
Sodium: 137 mmol/L (ref 135–145)
Sodium: 136 mmol/L (ref 135–145)

## 2018-04-16 LAB — NA AND K (SODIUM & POTASSIUM), RAND UR
Potassium Urine: 32 mmol/L
Sodium, Ur: 56 mmol/L

## 2018-04-16 LAB — TROPONIN I
Troponin I: <0.03 ng/mL (ref <0.03)
Troponin I: <0.03 ng/mL (ref <0.03)
Troponin I: <0.03 ng/mL (ref <0.03)

## 2018-04-16 LAB — BLOOD GAS, ARTERIAL
Acid-base deficit: 10.3 mmol/L — ABNORMAL HIGH (ref 0.0–2.0)
Bicarbonate: 14.8 mmol/L — ABNORMAL LOW (ref 20.0–28.0)
Drawn by: 235321
FIO2: 21
O2 Saturation: 98 %
Patient temperature: 98.6
pCO2 arterial: 31.5 mmHg — ABNORMAL LOW (ref 32.0–48.0)
pH, Arterial: 7.293 — ABNORMAL LOW (ref 7.350–7.450)
pO2, Arterial: 105 mmHg (ref 83.0–108.0)

## 2018-04-16 LAB — URINALYSIS, ROUTINE W REFLEX MICROSCOPIC
Bacteria, UA: NONE SEEN
Bilirubin Urine: NEGATIVE
Glucose, UA: >=500 mg/dL — AB
Hgb urine dipstick: NEGATIVE
Ketones, ur: 80 mg/dL — AB
Leukocytes, UA: NEGATIVE
Nitrite: NEGATIVE
Protein, ur: NEGATIVE mg/dL
Specific Gravity, Urine: 1.022 (ref 1.005–1.030)
pH: 5 (ref 5.0–8.0)

## 2018-04-16 LAB — HEMOGLOBIN A1C
Hgb A1c MFr Bld: 9.6 % — ABNORMAL HIGH (ref 4.8–5.6)
Mean Plasma Glucose: 228.82 mg/dL

## 2018-04-16 LAB — LACTIC ACID, PLASMA: Lactic Acid, Venous: 0.7 mmol/L (ref 0.5–1.9)

## 2018-04-16 LAB — MRSA PCR SCREENING: MRSA by PCR: NEGATIVE

## 2018-04-16 LAB — PHOSPHORUS: Phosphorus: 1.2 mg/dL — ABNORMAL LOW (ref 2.5–4.6)

## 2018-04-16 LAB — CHLORIDE, URINE, RANDOM: Chloride Urine: 97 mmol/L

## 2018-04-16 LAB — MAGNESIUM: Magnesium: 1.9 mg/dL (ref 1.7–2.4)

## 2018-04-16 MED ORDER — INSULIN DETEMIR 100 UNIT/ML ~~LOC~~ SOLN: 10.0000 [IU] | Freq: Two times a day (BID) | SUBCUTANEOUS | Status: DC

## 2018-04-16 MED ORDER — DEXTROSE 50 % IV SOLN: 50.0000 mL | Freq: Once | INTRAVENOUS | Status: DC

## 2018-04-16 MED ORDER — INSULIN ASPART 100 UNIT/ML ~~LOC~~ SOLN
0.0000 [IU] | Freq: Every day | SUBCUTANEOUS | Status: DC
  Administered 2018-04-17: 2 [IU] via SUBCUTANEOUS

## 2018-04-16 MED ORDER — KCL IN DEXTROSE-NACL 10-5-0.45 MEQ/L-%-% IV SOLN
INTRAVENOUS | Status: DC
  Administered 2018-04-16: 04:00:00 via INTRAVENOUS
  Filled 2018-04-16: qty 1000

## 2018-04-16 MED ORDER — SODIUM CHLORIDE 0.45 % IV SOLN
INTRAVENOUS | Status: DC
  Administered 2018-04-16: 14:00:00 via INTRAVENOUS

## 2018-04-16 MED ORDER — ENOXAPARIN SODIUM 40 MG/0.4ML ~~LOC~~ SOLN
40.0000 mg | Freq: Every day | SUBCUTANEOUS | Status: DC
  Administered 2018-04-16 – 2018-04-18 (×3): 40 mg via SUBCUTANEOUS
  Filled 2018-04-16 (×3): qty 0.4

## 2018-04-16 MED ORDER — SODIUM CHLORIDE 0.9 % IV SOLN
INTRAVENOUS | Status: DC
  Administered 2018-04-16: 02:00:00 via INTRAVENOUS

## 2018-04-16 MED ORDER — METOPROLOL TARTRATE 5 MG/5ML IV SOLN
2.5000 mg | INTRAVENOUS | Status: DC | PRN
  Administered 2018-04-16: 2.5 mg via INTRAVENOUS
  Filled 2018-04-16: qty 5

## 2018-04-16 MED ORDER — INSULIN ASPART 100 UNIT/ML ~~LOC~~ SOLN
5.0000 [IU] | Freq: Three times a day (TID) | SUBCUTANEOUS | Status: DC
  Administered 2018-04-16 – 2018-04-18 (×5): 5 [IU] via SUBCUTANEOUS

## 2018-04-16 MED ORDER — DEXTROSE-NACL 5-0.45 % IV SOLN
INTRAVENOUS | Status: DC
  Administered 2018-04-16: 01:00:00 via INTRAVENOUS

## 2018-04-16 MED ORDER — SODIUM CHLORIDE 0.9 % IV SOLN
INTRAVENOUS | Status: AC
  Administered 2018-04-16: 01:00:00 via INTRAVENOUS

## 2018-04-16 MED ORDER — SODIUM PHOSPHATES 45 MMOLE/15ML IV SOLN
30.0000 mmol | Freq: Once | INTRAVENOUS | Status: AC
  Administered 2018-04-16: 30 mmol via INTRAVENOUS
  Filled 2018-04-16: qty 10

## 2018-04-16 MED ORDER — GUAIFENESIN-DM 100-10 MG/5ML PO SYRP
5.0000 mL | ORAL_SOLUTION | ORAL | Status: DC | PRN
  Administered 2018-04-16: 5 mL via ORAL
  Filled 2018-04-16: qty 10

## 2018-04-16 MED ORDER — OXYCODONE HCL 5 MG PO TABS
5.0000 mg | ORAL_TABLET | ORAL | Status: DC | PRN
  Administered 2018-04-16 – 2018-04-18 (×7): 5 mg via ORAL
  Filled 2018-04-16 (×7): qty 1

## 2018-04-16 MED ORDER — INSULIN DETEMIR 100 UNIT/ML ~~LOC~~ SOLN
10.0000 [IU] | Freq: Every day | SUBCUTANEOUS | Status: DC
  Administered 2018-04-16: 10 [IU] via SUBCUTANEOUS
  Filled 2018-04-16: qty 0.1

## 2018-04-16 MED ORDER — DEXTROSE-NACL 5-0.45 % IV SOLN
INTRAVENOUS | Status: DC
  Administered 2018-04-16: 09:00:00 via INTRAVENOUS

## 2018-04-16 MED ORDER — PHENOL 1.4 % MT LIQD
1.0000 | OROMUCOSAL | Status: DC | PRN
  Filled 2018-04-16: qty 177

## 2018-04-16 MED ORDER — INSULIN ASPART 100 UNIT/ML ~~LOC~~ SOLN
3.0000 [IU] | Freq: Three times a day (TID) | SUBCUTANEOUS | Status: DC
  Administered 2018-04-16: 3 [IU] via SUBCUTANEOUS

## 2018-04-16 MED ORDER — INSULIN DETEMIR 100 UNIT/ML ~~LOC~~ SOLN
15.0000 [IU] | Freq: Two times a day (BID) | SUBCUTANEOUS | Status: DC
  Administered 2018-04-16 (×2): 15 [IU] via SUBCUTANEOUS
  Filled 2018-04-16 (×3): qty 0.15

## 2018-04-16 MED ORDER — INSULIN DETEMIR 100 UNIT/ML ~~LOC~~ SOLN: 15.0000 [IU] | Freq: Every day | SUBCUTANEOUS | Status: DC

## 2018-04-16 MED ORDER — MORPHINE SULFATE (PF) 2 MG/ML IV SOLN
2.0000 mg | INTRAVENOUS | Status: DC | PRN
  Administered 2018-04-17: 2 mg via INTRAVENOUS
  Filled 2018-04-16: qty 1

## 2018-04-16 MED ORDER — STERILE WATER FOR INJECTION IV SOLN
INTRAVENOUS | Status: DC
  Administered 2018-04-16 – 2018-04-17 (×2): via INTRAVENOUS
  Filled 2018-04-16 (×2): qty 850

## 2018-04-16 MED ORDER — ALPRAZOLAM 0.5 MG PO TABS
0.5000 mg | ORAL_TABLET | Freq: Two times a day (BID) | ORAL | Status: DC | PRN
  Administered 2018-04-17 – 2018-04-18 (×2): 0.5 mg via ORAL
  Filled 2018-04-16 (×2): qty 1

## 2018-04-16 MED ORDER — INSULIN ASPART 100 UNIT/ML ~~LOC~~ SOLN
0.0000 [IU] | Freq: Three times a day (TID) | SUBCUTANEOUS | Status: DC
  Administered 2018-04-16: 2 [IU] via SUBCUTANEOUS
  Administered 2018-04-16: 3 [IU] via SUBCUTANEOUS
  Administered 2018-04-17 – 2018-04-18 (×3): 2 [IU] via SUBCUTANEOUS
  Administered 2018-04-18: 1 [IU] via SUBCUTANEOUS

## 2018-04-16 NOTE — Care Management Note (Signed)
Case Management Note  Patient Details  Name: [name] MRN: [id number] Date of Birth: [birth year]  Subjective/Objective:                  dka  Action/Plan: Following for progression of care and condition. Following for cm needs.  Expected Discharge Date:                  Expected Discharge Plan:  Home/Self Care  In-House Referral:     Discharge planning Services  CM Consult  Post Acute Care Choice:    Choice offered to:     DME Arranged:    DME Agency:     HH Arranged:    HH Agency:     Status of Service:  In process, will continue to follow  If discussed at Long Length of Stay Meetings, dates discussed:    Additional Comments:  Leeroy Cha, RN 04/16/2018, 10:40 AM

## 2018-04-16 NOTE — Progress Notes (Signed)
Paged by Dr. Nevada Crane to discuss pt's desire to start Levemir insulin at home.  Currently patient taking NPH Insulin at home for basal needs due to cost.  Pt now has insurance and desires to switch back to Levemir insulin if possible.  Agree with Dr. Nevada Crane that we can switch pt back to Levemir insulin from the NPH since she now has insurance coverage.  Pt has initial visit with new PCP ([facility]) on 06/01/2018.    Based on previous doses of NPH Insulin and based on current CBGs, recommend we start patient on Levemir 15 units BID.  This would be about 70% total home dose of basal insulin patient was receiving as NPH insulin prior to admission.  Dr. Nevada Crane to change Levemir orders to 15 units BID.     --Will follow patient during hospitalization--  Wyn Quaker RN, MSN, CDE Diabetes Coordinator Inpatient Glycemic Control Team Team Pager: [phone] (8a-5p)

## 2018-04-16 NOTE — ED Notes (Signed)
ED TO INPATIENT HANDOFF REPORT  Name/Age/Gender [name] 32 y.o. female  Code Status Code Status History    Date Active Date Inactive Code Status Order ID Comments User Context   12/26/2017 1420 12/28/2017 1654 Full Code [id number]  Mariel Aloe, MD ED   05/19/2017 1036 05/20/2017 2036 Full Code [id number]  Merton Border, MD Inpatient   04/25/2016 0203 04/26/2016 1645 Full Code [id number]  [name], MD ED   02/10/2013 1520 02/12/2013 2126 Full Code [id number]  Theodis Blaze, MD Inpatient   07/11/2012 1710 07/14/2012 1631 Full Code [id number]  Cheek, Lester [place], RN Inpatient      Home/SNF/Other Home  Chief Complaint chest pain   Level of Care/Admitting Diagnosis ED Disposition    ED Disposition Condition [facility] Area: [facility] [[id number]]  Level of Care: Stepdown [14]  Admit to SDU based on following criteria: Hemodynamic compromise or significant risk of instability:  Patient requiring short term acute titration and management of vasoactive drips, and invasive monitoring (i.e., CVP and Arterial line).  Diagnosis: Diabetic ketoacidosis ([place]) [810175]  Admitting Physician: Merton Border Marshal.[place]  Attending Physician: Laren Everts, [place]  Estimated length of stay: past midnight tomorrow  Certification:: I certify this patient will need inpatient services for at least 2 midnights  PT Class (Do Not Modify): Inpatient [101]  PT Acc Code (Do Not Modify): Private [1]       Medical History Past Medical History:  Diagnosis Date  . Diabetes mellitus without complication ([place])     Allergies No Known Allergies  IV Location/Drains/Wounds Patient Lines/Drains/Airways Status   Active Line/Drains/Airways    Name:   Placement date:   Placement time:   Site:   Days:   Peripheral IV 04/15/18 Right Antecubital   04/15/18    2016    Antecubital   1          Labs/Imaging Results for orders placed or performed during the hospital  encounter of 04/15/18 (from the past 48 hour(s))  CBC with Differential/Platelet     Status: None   Collection Time: 04/15/18  8:57 AM  Result Value Ref Range   WBC 9.0 4.0 - 10.5 K/uL   RBC 4.84 3.87 - 5.11 MIL/uL   Hemoglobin 13.8 12.0 - 15.0 g/dL   HCT 42.5 36.0 - 46.0 %   MCV 87.8 78.0 - 100.0 fL   MCH 28.5 26.0 - 34.0 pg   MCHC 32.5 30.0 - 36.0 g/dL   RDW 13.2 11.5 - 15.5 %   Platelets 364 150 - 400 K/uL   Neutrophils Relative % 75 %   Neutro Abs 6.7 1.7 - 7.7 K/uL   Lymphocytes Relative 19 %   Lymphs Abs 1.7 0.7 - 4.0 K/uL   Monocytes Relative 6 %   Monocytes Absolute 0.6 0.1 - 1.0 K/uL   Eosinophils Relative 0 %   Eosinophils Absolute 0.0 0.0 - 0.7 K/uL   Basophils Relative 0 %   Basophils Absolute 0.0 0.0 - 0.1 K/uL    Comment: Performed at [facility], [place] [address], [place], [place] [address]  CBG monitoring, ED     Status: Abnormal   Collection Time: 04/15/18  6:55 PM  Result Value Ref Range   Glucose-Capillary 438 (H) 70 - 99 mg/dL  Basic metabolic panel     Status: Abnormal   Collection Time: 04/15/18  7:02 PM  Result Value Ref Range   Sodium 131 (L) 135 -  145 mmol/L   Potassium 5.8 (H) 3.5 - 5.1 mmol/L   Chloride 102 98 - 111 mmol/L   CO2 <7 (L) 22 - 32 mmol/L   Glucose, Bld 401 (H) 70 - 99 mg/dL   BUN 17 6 - 20 mg/dL   Creatinine, Ser 0.95 0.44 - 1.00 mg/dL   Calcium 9.2 8.9 - 10.3 mg/dL   GFR calc non Af Amer >60 >60 mL/min   GFR calc Af Amer >60 >60 mL/min    Comment: (NOTE) The eGFR has been calculated using the CKD EPI equation. This calculation has not been validated in all clinical situations. eGFR's persistently <60 mL/min signify possible Chronic Kidney Disease.    Anion gap 23 (H) 5 - 15    Comment: Performed at [facility], [place] [address] [place], [place] [address]  Hepatic function panel     Status: Abnormal   Collection Time: 04/15/18  7:28 PM  Result Value Ref Range   Total Protein 8.1 6.5 - 8.1  g/dL   Albumin 4.3 3.5 - 5.0 g/dL   AST 16 15 - 41 U/L   ALT 16 0 - 44 U/L   Alkaline Phosphatase 55 38 - 126 U/L   Total Bilirubin 1.6 (H) 0.3 - 1.2 mg/dL   Bilirubin, Direct 0.2 0.0 - 0.2 mg/dL   Indirect Bilirubin 1.4 (H) 0.3 - 0.9 mg/dL    Comment: Performed at [facility], [place] [address] [place], [place] [address]  Lipase, blood     Status: None   Collection Time: 04/15/18  7:28 PM  Result Value Ref Range   Lipase 19 11 - 51 U/L    Comment: Performed at [facility], [place] [address] [place], [place] [address]  D-dimer, quantitative (not at [facility])     Status: Abnormal   Collection Time: 04/15/18  7:28 PM  Result Value Ref Range   D-Dimer, Quant 1.09 (H) 0.00 - 0.50 ug/mL-FEU    Comment: (NOTE) At the manufacturer cut-off of 0.50 ug/mL FEU, this assay has been documented to exclude PE with a sensitivity and negative predictive value of 97 to 99%.  At this time, this assay has not been approved by the FDA to exclude DVT/VTE. Results should be correlated with clinical presentation. Performed at [facility], [place] [address] [place], [place] [address]   I-stat troponin, ED     Status: None   Collection Time: 04/15/18  8:46 PM  Result Value Ref Range   Troponin i, poc 0.00 0.00 - 0.08 ng/mL   Comment 3            Comment: Due to the release kinetics of cTnI, a negative result within the first hours of the onset of symptoms does not rule out myocardial infarction with certainty. If myocardial infarction is still suspected, repeat the test at appropriate intervals.   I-Stat beta hCG blood, ED     Status: None   Collection Time: 04/15/18  8:46 PM  Result Value Ref Range   I-stat hCG, quantitative <5.0 <5 mIU/mL   Comment 3            Comment:   GEST. AGE      CONC.  (mIU/mL)   <=1 WEEK        5 - 50     2 WEEKS       50 - 500     3 WEEKS       100 - 10,000  4 WEEKS     1,000 - 30,000        FEMALE AND  NON-PREGNANT FEMALE:     LESS THAN 5 mIU/mL   Potassium     Status: Abnormal   Collection Time: 04/15/18 10:43 PM  Result Value Ref Range   Potassium 5.8 (H) 3.5 - 5.1 mmol/L    Comment: NO VISIBLE HEMOLYSIS Performed at [place] [address] [place], [place] [address]   CBG monitoring, ED     Status: Abnormal   Collection Time: 04/15/18 11:41 PM  Result Value Ref Range   Glucose-Capillary 298 (H) 70 - 99 mg/dL   Dg Chest 2 View  Result Date: 04/15/2018 CLINICAL DATA:  Chest pain EXAM: CHEST - 2 VIEW COMPARISON:  06/01/2017 FINDINGS: The heart size and mediastinal contours are within normal limits. Both lungs are clear. The visualized skeletal structures are unremarkable. IMPRESSION: No active cardiopulmonary disease. Electronically Signed   By: Donavan Foil M.D.   On: 04/15/2018 19:29   Ct Angio Chest Pe W And/or Wo Contrast  Result Date: 04/15/2018 CLINICAL DATA:  Chest pain and shortness of breath for 3 days. EXAM: CT ANGIOGRAPHY CHEST WITH CONTRAST TECHNIQUE: Multidetector CT imaging of the chest was performed using the standard protocol during bolus administration of intravenous contrast. Multiplanar CT image reconstructions and MIPs were obtained to evaluate the vascular anatomy. CONTRAST:  113m ISOVUE-370 IOPAMIDOL (ISOVUE-370) INJECTION 76% COMPARISON:  Chest radiograph April 15, 2018 and CT chest April 11, 2014 FINDINGS: CARDIOVASCULAR: Adequate contrast opacification of the pulmonary artery's. Main pulmonary artery is not enlarged. No pulmonary arterial filling defects to the level of the subsegmental branches. Heart size is normal, no right heart strain. No pericardial effusion. Thoracic aorta is normal course and caliber, unremarkable. MEDIASTINUM/NODES: No lymphadenopathy by CT size criteria. Small amount of similar residual thymic tissue. LUNGS/PLEURA: Tracheobronchial tree is patent, no pneumothorax. No pleural effusions, focal consolidations,  pulmonary nodules or masses. UPPER ABDOMEN: Non-acute.  Mild suspected hepatic steatosis. MUSCULOSKELETAL: Non-acute.  Bilateral breast piercings. Review of the MIP images confirms the above findings. IMPRESSION: Negative CT angiogram chest. Electronically Signed   By: CElon AlasM.D.   On: 04/15/2018 22:33    Pending Labs [employer](From admission, onward)    Start     Ordered   Signed and HCorporate treasurer STAT Now then every 4 hours ,   STAT     Signed and Held   Signed and Held  Creatinine, serum  (enoxaparin (LOVENOX)    CrCl >/= 30 ml/min)  Weekly,   R    Comments:  while on enoxaparin therapy    Signed and Held   Signed and Held  Troponin I (q 6hr x 3)  Now then every 6 hours,   R     Signed and Held          Vitals/Pain Today's Vitals   04/15/18 2130 04/15/18 2200 04/15/18 2244 04/15/18 2352  BP: 129/86 129/86  131/87  Pulse:  (!) 106  (!) 112  Resp: 14 16  (!) 22  Temp:      TempSrc:      SpO2:  98%  100%  Weight:      Height:      PainSc:   6      Isolation Precautions No active isolations  Medications Medications  iopamidol (ISOVUE-370) 76 % injection (has no administration in time range)  dextrose 5 %-0.45 % sodium chloride  infusion (has no administration in time range)  insulin regular bolus via infusion 0-10 Units (has no administration in time range)  insulin regular (NOVOLIN R,HUMULIN R) 100 Units in sodium chloride 0.9 % 100 mL (1 Units/mL) infusion (2.4 Units/hr Intravenous New Bag/Given 04/15/18 2351)  dextrose 50 % solution 25 mL (has no administration in time range)  0.9 %  sodium chloride infusion (has no administration in time range)  sodium chloride 0.9 % bolus 1,000 mL (has no administration in time range)  sodium chloride 0.9 % bolus 2,000 mL (2,000 mLs Intravenous New Bag/Given 04/15/18 2019)  HYDROmorphone (DILAUDID) injection 0.5 mg (0.5 mg Intravenous Given 04/15/18 2019)  iopamidol (ISOVUE-370) 76 % injection 100 mL  (100 mLs Intravenous Contrast Given 04/15/18 2214)    Mobility walks

## 2018-04-16 NOTE — Progress Notes (Addendum)
Inpatient Diabetes Program Recommendations  AACE/ADA: Consensus Statement on Inpatient Glycemic Control (2015)  Target Ranges:  Prepandial:   less than 140 mg/dL      Peak postprandial:   less than 180 mg/dL (1-2 hours)      Critically ill patients:  140 - 180 mg/dL    Results for [name], [name] (MRN [id number]) as of 04/16/2018 12:09  Ref. Range 04/16/2018 06:20 04/16/2018 07:32 04/16/2018 08:45 04/16/2018 09:38 04/16/2018 12:02  Glucose-Capillary Latest Ref Range: 70 - 99 mg/dL 91 92 108 (H) 131 (H) 154 (H)   Admit with DKA  DM history: Type 1  Home DM meds: Novolog correction scale 0-20 tid ac                              NPH 22 units BID  Current DM inpatient meds: Novolog sensitive (0-9 units) tid ac                                               Novolog (0-5 units) hs                                               Novolog 3 units meal coverage tid                                               Levemir 10 units daily                                                   Insulin drip stopped 0623 this am. Transitioned to SQ insulin. Spoke with patient about her diabetes management. She stated she had been on levemir but changed to NPH in past year due to lack of insurance. She monitors her blood sugars and gives herself correction scale as ordered (in addition to the NPH BID). She stated now she has insurance with her job her goal is to have an endocrinologist again. She has appt scheduled in November with a family practice in [place] and will inquire about referral to endo at that point.   Spoke to patient about her A1c drawn today. It is 9.6% today which was decreased from 11.5% on 12/27/17. Patient said she has been exercising and working hard to improve her A1c (past was 7.7% in January 2018).   Patient asked questions about the relationship between stress and blood sugars. She stated she has been in extreme stress at work and has noticed her blood sugars have increased. She did request  that we pass along to her RN/MD this concern and would like to have it documented in her AVS/papers for her to take home about the relationship between stress and increased blood sugars. Shared above information with patient's RN and she will also pass along to MD.  MD please consider following inpt diabetes recommendation:  Discontinue Levemir daily (had dose this am at 0917). Add 18 units of NPH  BID (this is 80% of patient's home dose) to start tonight.    -- Will follow during hospitalization.--  Jonna Clark RN, MSN Diabetes Coordinator Inpatient Glycemic Control Team Team Pager: [phone] (8am-5pm)

## 2018-04-16 NOTE — Progress Notes (Signed)
PROGRESS NOTE  [name] ZTI:[id number] DOB: 07/30/1985 DOA: 04/15/2018 PCP: [name], FNP  HPI/Recap of past 24 hours: [name]  is a 32 y.o. female, past medical history significant for type I diabetes mellitus and history of DKA in the past presenting today with 3 days of history of pleuritic chest and epigastric pain associated with nausea and vomiting.  No history of fever or chills.  Patient reports history of anxiety and increased mental stressors lately.  Patient has been compliant with her insulin.  Admitted for type I DKA  04/16/2018: Patient seen and examined at her bedside.  States she had not missed any of her insulin dose.  However noted decrease in appetite with nausea and vomiting yesterday x1. Chronic constipation also.  Has recently started on exercise routine.   Assessment/Plan: Active Problems:   Diabetic ketoacidosis (HCC)   Type I DKA Completed the first phase Transition to long and short acting insulin Patient expresses preference to Levemir rather than her Humulin States she now has insurance to afford it Hemoglobin A1c 9.6 on 04/16/2018 from 11.30 December 2017 Diabetes coordinator consulted to assist with insulin education  Persistent severe metabolic acidosis secondary to type I DKA versus others Persist even after anion gap closed Denies GI losses ABG done this morning revealed pH of 7.2 Chemistry bicarb 14 Lactic acid unremarkable Normal creatinine Obtain urine potassium, sodium, and chloride to rule out any secondary etiology Repeat BMP in 4 hours  Pleuritic chest pain CTA negative for PE No sign of acute ischemia on EKG First set troponin negative  Generalized anxiety Xanax as needed  Resolved hyperkalemia   Code Status: Full code  Family Communication: None at bedside  Disposition Plan: Home when clinically stable possibly tomorrow 04/17/2018   Consultants:  Diabetes  coordinator  Procedures:  None  Antimicrobials:  None  DVT prophylaxis: Lovenox subcu daily   Objective: Vitals:   04/16/18 0759 04/16/18 0900 04/16/18 1000 04/16/18 1204  BP:  118/71 103/60   Pulse:  (!) 104 (!) 101   Resp:  20 (!) 26   Temp: 97.6 F (36.4 C)   98.6 F (37 C)  TempSrc:    Oral  SpO2:  100% 100%   Weight:      Height:        Intake/Output Summary (Last 24 hours) at 04/16/2018 1252 Last data filed at 04/16/2018 1100 Gross per 24 hour  Intake 1771.66 ml  Output 700 ml  Net 1071.66 ml   Filed Weights   04/15/18 1856 04/16/18 0045  Weight: 70.3 kg 67.9 kg    Exam:  . General: 32 y.o. year-old female well developed well nourished in no acute distress.  Alert and oriented x3. . Cardiovascular: Regular rate and rhythm with no rubs or gallops.  No thyromegaly or JVD noted.   Marland Kitchen Respiratory: Clear to auscultation with no wheezes or rales. Good inspiratory effort. . Abdomen: Soft nontender nondistended with normal bowel sounds x4 quadrants. . Musculoskeletal: No lower extremity edema. 2/4 pulses in all 4 extremities. . Skin: No ulcerative lesions noted or rashes, . Psychiatry: Mood is appropriate for condition and setting   Data Reviewed: CBC: Recent Labs  Lab 04/15/18 0857  WBC 9.0  NEUTROABS 6.7  HGB 13.8  HCT 42.5  MCV 87.8  PLT 825   Basic Metabolic Panel: Recent Labs  Lab 04/15/18 1902 04/15/18 2243 04/16/18 0103 04/16/18 0350 04/16/18 0725  NA 131*  --  132* 135 138  K 5.8*  5.8* 4.8 4.5 4.4  CL 102  --  105 113* 113*  CO2 <7*  --  12* 14* 16*  GLUCOSE 401*  --  263* 141* 99  BUN 17  --  15 12 11   CREATININE 0.95  --  0.94 0.70 0.62  CALCIUM 9.2  --  8.6* 7.9* 8.7*   GFR: Estimated Creatinine Clearance: 95.4 mL/min (by C-G formula based on SCr of 0.62 mg/dL). Liver Function Tests: Recent Labs  Lab 04/15/18 1928  AST 16  ALT 16  ALKPHOS 55  BILITOT 1.6*  PROT 8.1  ALBUMIN 4.3   Recent Labs  Lab 04/15/18 1928   LIPASE 19   No results for input(s): AMMONIA in the last 168 hours. Coagulation Profile: No results for input(s): INR, PROTIME in the last 168 hours. Cardiac Enzymes: Recent Labs  Lab 04/16/18 0103 04/16/18 0725  TROPONINI <0.03 <0.03   BNP (last 3 results) No results for input(s): PROBNP in the last 8760 hours. HbA1C: Recent Labs    04/16/18 0401  HGBA1C 9.6*   CBG: Recent Labs  Lab 04/16/18 0620 04/16/18 0732 04/16/18 0845 04/16/18 0938 04/16/18 1202  GLUCAP 91 92 108* 131* 154*   Lipid Profile: No results for input(s): CHOL, HDL, LDLCALC, TRIG, CHOLHDL, LDLDIRECT in the last 72 hours. Thyroid Function Tests: No results for input(s): TSH, T4TOTAL, FREET4, T3FREE, THYROIDAB in the last 72 hours. Anemia Panel: No results for input(s): VITAMINB12, FOLATE, FERRITIN, TIBC, IRON, RETICCTPCT in the last 72 hours. Urine analysis:    Component Value Date/Time   COLORURINE YELLOW 04/16/2018 [place] 04/16/2018 0639   LABSPEC 1.022 04/16/2018 0639   PHURINE 5.0 04/16/2018 0639   GLUCOSEU >=500 (A) 04/16/2018 0639   HGBUR NEGATIVE 04/16/2018 0639   BILIRUBINUR NEGATIVE 04/16/2018 0639   KETONESUR 80 (A) 04/16/2018 0639   PROTEINUR NEGATIVE 04/16/2018 0639   UROBILINOGEN 0.2 08/07/2016 1610   NITRITE NEGATIVE 04/16/2018 0639   LEUKOCYTESUR NEGATIVE 04/16/2018 0639   Sepsis Labs: @LABRCNTIP (procalcitonin:4,lacticidven:4)  ) Recent Results (from the past 240 hour(s))  MRSA PCR Screening     Status: None   Collection Time: 04/16/18  1:36 AM  Result Value Ref Range Status   MRSA by PCR NEGATIVE NEGATIVE Final    Comment:        The GeneXpert MRSA Assay (FDA approved for NASAL specimens only), is one component of a comprehensive MRSA colonization surveillance program. It is not intended to diagnose MRSA infection nor to guide or monitor treatment for MRSA infections. Performed at [facility], [place] [address]  [place], [place] [address]       Studies: Dg Chest 2 View  Result Date: 04/15/2018 CLINICAL DATA:  Chest pain EXAM: CHEST - 2 VIEW COMPARISON:  06/01/2017 FINDINGS: The heart size and mediastinal contours are within normal limits. Both lungs are clear. The visualized skeletal structures are unremarkable. IMPRESSION: No active cardiopulmonary disease. Electronically Signed   By: Donavan Foil M.D.   On: 04/15/2018 19:29   Ct Angio Chest Pe W And/or Wo Contrast  Result Date: 04/15/2018 CLINICAL DATA:  Chest pain and shortness of breath for 3 days. EXAM: CT ANGIOGRAPHY CHEST WITH CONTRAST TECHNIQUE: Multidetector CT imaging of the chest was performed using the standard protocol during bolus administration of intravenous contrast. Multiplanar CT image reconstructions and MIPs were obtained to evaluate the vascular anatomy. CONTRAST:  167mL ISOVUE-370 IOPAMIDOL (ISOVUE-370) INJECTION 76% COMPARISON:  Chest radiograph April 15, 2018 and CT chest April 11, 2014  FINDINGS: CARDIOVASCULAR: Adequate contrast opacification of the pulmonary artery's. Main pulmonary artery is not enlarged. No pulmonary arterial filling defects to the level of the subsegmental branches. Heart size is normal, no right heart strain. No pericardial effusion. Thoracic aorta is normal course and caliber, unremarkable. MEDIASTINUM/NODES: No lymphadenopathy by CT size criteria. Small amount of similar residual thymic tissue. LUNGS/PLEURA: Tracheobronchial tree is patent, no pneumothorax. No pleural effusions, focal consolidations, pulmonary nodules or masses. UPPER ABDOMEN: Non-acute.  Mild suspected hepatic steatosis. MUSCULOSKELETAL: Non-acute.  Bilateral breast piercings. Review of the MIP images confirms the above findings. IMPRESSION: Negative CT angiogram chest. Electronically Signed   By: Elon Alas M.D.   On: 04/15/2018 22:33    Scheduled Meds: . dextrose  50 mL Intravenous Once  . enoxaparin (LOVENOX) injection  40 mg  Subcutaneous Daily  . insulin aspart  0-5 Units Subcutaneous QHS  . insulin aspart  0-9 Units Subcutaneous TID WC  . insulin aspart  3 Units Subcutaneous TID WC  . insulin detemir  10 Units Subcutaneous Daily    Continuous Infusions: . dextrose 5 % and 0.45% NaCl 75 mL/hr at 04/16/18 1100  . sodium chloride       LOS: 1 day     Kayleen Memos, MD [facility] Pager [phone]  If 7PM-7AM, please contact night-coverage [email or web address] Password [facility] 04/16/2018, 12:52 PM

## 2018-04-17 LAB — BASIC METABOLIC PANEL
Anion gap: 9 (ref 5–15)
BUN: 14 mg/dL (ref 6–20)
CO2: 27 mmol/L (ref 22–32)
Calcium: 8.3 mg/dL — ABNORMAL LOW (ref 8.9–10.3)
Chloride: 104 mmol/L (ref 98–111)
Creatinine, Ser: 0.55 mg/dL (ref 0.44–1.00)
GFR calc Af Amer: >60 mL/min (ref >60)
GFR calc non Af Amer: >60 mL/min (ref >60)
Glucose, Bld: 165 mg/dL — ABNORMAL HIGH (ref 70–99)
Potassium: 3.4 mmol/L — ABNORMAL LOW (ref 3.5–5.1)
Sodium: 140 mmol/L (ref 135–145)

## 2018-04-17 LAB — PHOSPHORUS: Phosphorus: 3.3 mg/dL (ref 2.5–4.6)

## 2018-04-17 LAB — GLUCOSE, CAPILLARY
Glucose-Capillary: 51 mg/dL — ABNORMAL LOW (ref 70–99)
Glucose-Capillary: 113 mg/dL — ABNORMAL HIGH (ref 70–99)
Glucose-Capillary: 196 mg/dL — ABNORMAL HIGH (ref 70–99)
Glucose-Capillary: 151 mg/dL — ABNORMAL HIGH (ref 70–99)
Glucose-Capillary: 210 mg/dL — ABNORMAL HIGH (ref 70–99)

## 2018-04-17 LAB — GROUP A STREP BY PCR: Group A Strep by PCR: NOT DETECTED

## 2018-04-17 MED ORDER — AMLODIPINE BESYLATE 5 MG PO TABS
5.0000 mg | ORAL_TABLET | Freq: Every day | ORAL | Status: DC
  Filled 2018-04-17: qty 1

## 2018-04-17 MED ORDER — IBUPROFEN 100 MG PO CHEW
100.0000 mg | CHEWABLE_TABLET | Freq: Three times a day (TID) | ORAL | Status: DC | PRN
  Filled 2018-04-17: qty 1

## 2018-04-17 MED ORDER — POTASSIUM CHLORIDE CRYS ER 20 MEQ PO TBCR
40.0000 meq | EXTENDED_RELEASE_TABLET | Freq: Once | ORAL | Status: AC
  Administered 2018-04-17: 40 meq via ORAL
  Filled 2018-04-17: qty 2

## 2018-04-17 MED ORDER — INSULIN DETEMIR 100 UNIT/ML ~~LOC~~ SOLN
10.0000 [IU] | Freq: Two times a day (BID) | SUBCUTANEOUS | Status: DC
  Administered 2018-04-17 – 2018-04-18 (×3): 10 [IU] via SUBCUTANEOUS
  Filled 2018-04-17 (×4): qty 0.1

## 2018-04-17 MED ORDER — ACETAMINOPHEN 325 MG PO TABS
650.0000 mg | ORAL_TABLET | Freq: Four times a day (QID) | ORAL | Status: DC | PRN
  Administered 2018-04-17 (×2): 650 mg via ORAL
  Filled 2018-04-17 (×2): qty 2

## 2018-04-17 MED ORDER — MAGNESIUM SULFATE 2 GM/50ML IV SOLN
2.0000 g | Freq: Once | INTRAVENOUS | Status: AC
  Administered 2018-04-17: 2 g via INTRAVENOUS
  Filled 2018-04-17: qty 50

## 2018-04-17 NOTE — Plan of Care (Signed)
  Problem: Nutrition: Goal: Adequate nutrition will be maintained Outcome: Progressing   Problem: Elimination: Goal: Will not experience complications related to bowel motility Outcome: Progressing   Problem: Pain Managment: Goal: General experience of comfort will improve Outcome: Progressing   Problem: Safety: Goal: Ability to remain free from injury will improve Outcome: Progressing   

## 2018-04-17 NOTE — Progress Notes (Signed)

## 2018-04-17 NOTE — Progress Notes (Addendum)
Hypoglycemic Event  CBG: 51  Treatment: 4oz Juice, 2pks of graham crackers  Symptoms: None, Pt was asleep  Follow-up CBG: Time: 0806 CBG Result:  113  Possible Reasons for Event:   Comments/MD notified: Dr Osvaldo Shipper, [facility]

## 2018-04-17 NOTE — Progress Notes (Signed)
Pt has arrived to room 1509 from ICU. Pt alert, oriented, and without c/o. Sitting up in bed eating lunch.

## 2018-04-17 NOTE — Progress Notes (Signed)
PROGRESS NOTE  [name] SHF:[id number] DOB: [birth year] DOA: 04/15/2018 PCP: [name], FNP  HPI/Recap of past 24 hours: [name]  is a 32 y.o. female, past medical history significant for type I diabetes mellitus and history of DKA in the past presenting today with 3 days of history of pleuritic chest and epigastric pain associated with nausea and vomiting.  No history of fever or chills.  Patient reports history of anxiety and increased mental stressors lately.  Patient has been compliant with her insulin.  Admitted for type I DKA  04/16/2018: Patient seen and examined at her bedside.  States she had not missed any of her insulin dose.  However noted decrease in appetite with nausea and vomiting yesterday x1. Chronic constipation also.  Has recently started on exercise routine.   04/17/2018: Patient seen and examined at bedside.  Reports toothache and would like to have it pulled after discharge.  Denies nausea or abdominal cramping.    This morning patient was significantly hypoglycemic with a CBG 51.  Denies missing her meals.  States she had a pretty good dinner last night prior to going to bed.  We will continue to adjust her insulin to avoid hypoglycemia.  Assessment/Plan: Active Problems:   Diabetic ketoacidosis ([place])   Uncontrolled type I DKA complicated by hypoglycemia DKA physiology has resolved Patient expresses preference to Levemir rather than her Humulin-States she now has insurance to afford it Hemoglobin A1c 9.6 on 04/16/2018 from 11.30 December 2017 Hypoglycemic this morning with CBG 51 Reduced dose of Levemir to 10 twice daily On NovoLog 5 units before meals and insulin sliding scale Continue to adjust insulin.  Avoid hypoglycemia. Her next PCP Appointment is in November 2019 Continue to monitor blood sugars mostly  Right back lower tooth ache Alternate between Tylenol and ibuprofen as needed States she will go to a dentist and have the tooth  pulled Afebrile No sign of systemic infection  Resolved severe hypophosphatemia Repleted with IV phosphorus  Resolved severe metabolic acidosis secondary to type I DKA versus others Repleted with isotonic bicarb  Resolved pleuritic chest pain CTA negative for PE No sign of acute ischemia on EKG First set troponin negative  Generalized anxiety Xanax as needed  Resolved hyperkalemia  Mild hypokalemia Repleted with oral potassium 40 mEq once   Code Status: Full code  Family Communication: None at bedside  Disposition Plan: Home when clinically stable possibly tomorrow 04/17/2018   Consultants:  Diabetes coordinator  Procedures:  None  Antimicrobials:  None  DVT prophylaxis: Lovenox subcu daily   Objective: Vitals:   04/17/18 0400 04/17/18 0500 04/17/18 0600 04/17/18 0721  BP: 134/80 (!) 137/91 131/78   Pulse: 86 97 86   Resp: 14 13 13    Temp: 98.7 F (37.1 C)   98.8 F (37.1 C)  TempSrc: Oral   Oral  SpO2: 100% 100% 100%   Weight:      Height:        Intake/Output Summary (Last 24 hours) at 04/17/2018 1010 Last data filed at 04/17/2018 0956 Gross per 24 hour  Intake 712.52 ml  Output 2300 ml  Net -1587.48 ml   Filed Weights   04/15/18 1856 04/16/18 0045  Weight: 70.3 kg 67.9 kg    Exam:  . General: 32 y.o. year-old female well-developed well-nourished in no acute distress.  Alert and oriented x3.   . Cardiovascular: Regular rate and rhythm with no rubs or gallops.  No thyromegaly or JVD noted.  Respiratory:  Clear to auscultation with no wheezes or rales. Good inspiratory effort. . Abdomen: Soft nontender nondistended with normal bowel sounds x4 quadrants. . Musculoskeletal: No lower extremity edema. 2/4 pulses in all 4 extremities. . Skin: No ulcerative lesions noted or rashes, . Psychiatry: Mood is appropriate for condition and setting   Data Reviewed: CBC: Recent Labs  Lab 04/15/18 0857  WBC 9.0  NEUTROABS 6.7  HGB 13.8  HCT 42.5   MCV 87.8  PLT 254   Basic Metabolic Panel: Recent Labs  Lab 04/16/18 0350 04/16/18 0725 04/16/18 1237 04/16/18 1634 04/17/18 0333  NA 135 138 137 136 140  K 4.5 4.4 4.0 4.1 3.4*  CL 113* 113* 109 108 104  CO2 14* 16* 16* 18* 27  GLUCOSE 141* 99 200* 200* 165*  BUN 12 11 9 13 14   CREATININE 0.70 0.62 0.67 0.72 0.55  CALCIUM 7.9* 8.7* 8.7* 8.6* 8.3*  MG  --   --   --  1.9  --   PHOS  --   --   --  1.2* 3.3   GFR: Estimated Creatinine Clearance: 95.4 mL/min (by C-G formula based on SCr of 0.55 mg/dL). Liver Function Tests: Recent Labs  Lab 04/15/18 1928  AST 16  ALT 16  ALKPHOS 55  BILITOT 1.6*  PROT 8.1  ALBUMIN 4.3   Recent Labs  Lab 04/15/18 1928  LIPASE 19   No results for input(s): AMMONIA in the last 168 hours. Coagulation Profile: No results for input(s): INR, PROTIME in the last 168 hours. Cardiac Enzymes: Recent Labs  Lab 04/16/18 0103 04/16/18 0725 04/16/18 1237  TROPONINI <0.03 <0.03 <0.03   BNP (last 3 results) No results for input(s): PROBNP in the last 8760 hours. HbA1C: Recent Labs    04/16/18 0401  HGBA1C 9.6*   CBG: Recent Labs  Lab 04/16/18 1202 04/16/18 1615 04/16/18 2203 04/17/18 0720 04/17/18 0806  GLUCAP 154* 203* 139* 51* 113*   Lipid Profile: No results for input(s): CHOL, HDL, LDLCALC, TRIG, CHOLHDL, LDLDIRECT in the last 72 hours. Thyroid Function Tests: No results for input(s): TSH, T4TOTAL, FREET4, T3FREE, THYROIDAB in the last 72 hours. Anemia Panel: No results for input(s): VITAMINB12, FOLATE, FERRITIN, TIBC, IRON, RETICCTPCT in the last 72 hours. Urine analysis:    Component Value Date/Time   COLORURINE YELLOW 04/16/2018 [place] 04/16/2018 0639   LABSPEC 1.022 04/16/2018 0639   PHURINE 5.0 04/16/2018 0639   GLUCOSEU >=500 (A) 04/16/2018 0639   HGBUR NEGATIVE 04/16/2018 0639   BILIRUBINUR NEGATIVE 04/16/2018 0639   KETONESUR 80 (A) 04/16/2018 0639   PROTEINUR NEGATIVE 04/16/2018 0639    UROBILINOGEN 0.2 08/07/2016 1610   NITRITE NEGATIVE 04/16/2018 0639   LEUKOCYTESUR NEGATIVE 04/16/2018 0639   Sepsis Labs: @LABRCNTIP (procalcitonin:4,lacticidven:4)  ) Recent Results (from the past 240 hour(s))  MRSA PCR Screening     Status: None   Collection Time: 04/16/18  1:36 AM  Result Value Ref Range Status   MRSA by PCR NEGATIVE NEGATIVE Final    Comment:        The GeneXpert MRSA Assay (FDA approved for NASAL specimens only), is one component of a comprehensive MRSA colonization surveillance program. It is not intended to diagnose MRSA infection nor to guide or monitor treatment for MRSA infections. Performed at [facility], [place] [address], [place],  [address]       Studies: No results found.  Scheduled Meds: . enoxaparin (LOVENOX) injection  40 mg Subcutaneous Daily  . insulin  aspart  0-5 Units Subcutaneous QHS  . insulin aspart  0-9 Units Subcutaneous TID WC  . insulin aspart  5 Units Subcutaneous TID WC  . insulin detemir  10 Units Subcutaneous BID    Continuous Infusions: . sodium chloride       LOS: 2 days     Kayleen Memos, MD [facility] Pager [phone]  If 7PM-7AM, please contact night-coverage [email or web address] Password [email or web address] 04/17/2018, 10:10 AM

## 2018-04-18 ENCOUNTER — Inpatient Hospital Stay (HOSPITAL_COMMUNITY): Payer: 59

## 2018-04-18 LAB — GLUCOSE, CAPILLARY
Glucose-Capillary: 131 mg/dL — ABNORMAL HIGH (ref 70–99)
Glucose-Capillary: 198 mg/dL — ABNORMAL HIGH (ref 70–99)

## 2018-04-18 LAB — BASIC METABOLIC PANEL
Anion gap: 10 (ref 5–15)
BUN: 12 mg/dL (ref 6–20)
CO2: 27 mmol/L (ref 22–32)
Calcium: 8.9 mg/dL (ref 8.9–10.3)
Chloride: 105 mmol/L (ref 98–111)
Creatinine, Ser: 0.57 mg/dL (ref 0.44–1.00)
GFR calc Af Amer: >60 mL/min (ref >60)
GFR calc non Af Amer: >60 mL/min (ref >60)
Glucose, Bld: 121 mg/dL — ABNORMAL HIGH (ref 70–99)
Potassium: 3.8 mmol/L (ref 3.5–5.1)
Sodium: 142 mmol/L (ref 135–145)

## 2018-04-18 MED ORDER — BLOOD GLUCOSE MONITOR KIT: PACK | 0 refills | Status: DC

## 2018-04-18 MED ORDER — INSULIN ASPART 100 UNIT/ML ~~LOC~~ SOLN: 0.0000 [IU] | Freq: Three times a day (TID) | SUBCUTANEOUS | 0 refills | Status: DC

## 2018-04-18 MED ORDER — INSULIN ASPART 100 UNIT/ML ~~LOC~~ SOLN: 5.0000 [IU] | Freq: Three times a day (TID) | SUBCUTANEOUS | 0 refills | Status: DC

## 2018-04-18 MED ORDER — INSULIN DETEMIR 100 UNIT/ML ~~LOC~~ SOLN: 10.0000 [IU] | Freq: Two times a day (BID) | SUBCUTANEOUS | 0 refills | Status: DC

## 2018-04-18 MED ORDER — INSULIN ASPART 100 UNIT/ML ~~LOC~~ SOLN: 0.0000 [IU] | Freq: Every day | SUBCUTANEOUS | 0 refills | Status: DC

## 2018-04-18 MED ORDER — OXYCODONE HCL 5 MG PO TABS: 5.0000 mg | ORAL_TABLET | Freq: Two times a day (BID) | ORAL | 0 refills | Status: DC | PRN

## 2018-04-18 MED ORDER — IOPAMIDOL (ISOVUE-300) INJECTION 61%
100.0000 mL | Freq: Once | INTRAVENOUS | Status: AC | PRN
  Administered 2018-04-18: 100 mL via INTRAVENOUS

## 2018-04-18 NOTE — Discharge Summary (Signed)
Discharge Summary  [name]:[id number] DOB: Oct 09, 1985  PCP: [name], FNP  Admit date: 04/15/2018 Discharge date: 04/18/2018  Time spent: 25 minutes  Recommendations for Outpatient Follow-up:  1. Follow-up with your PCP 2. Follow-up with psychiatry 3. Follow-up with your dentist 4. Negative CT face  Discharge Diagnoses:  Active Hospital Problems   Diagnosis Date Noted  . Diabetic ketoacidosis ([place]) 04/15/2018    Resolved Hospital Problems  No resolved problems to display.    Discharge Condition: Stable  Diet recommendation: Resume previous diet  Vitals:   04/18/18 1014 04/18/18 1133  BP: (!) 157/100 (!) 145/98  Pulse: (!) 115 (!) 103  Resp: 18 20  Temp: 98.1 F (36.7 C) 98.2 F (36.8 C)  SpO2: 100% 100%    History of present illness:  AshleyGriffinis a32 y.o.female,past medical history significant for type I diabetes mellitus and history of DKA in the past presenting today with 3 days of history of pleuritic chest and epigastric pain associated with nausea and vomiting. No history of fever or chills. Patient reports history of anxiety and increased mental stressors lately. Patient has been compliant with her insulin.  Admitted for type I DKA  04/16/2018: Patient seen and examined at her bedside.  States she had not missed any of her insulin dose.  However noted decrease in appetite with nausea and vomiting yesterday x1. Chronic constipation also.  Has recently started on exercise routine.   04/17/2018: Patient seen and examined at bedside.  Reports toothache and would like to have it pulled after discharge.  Denies nausea or abdominal cramping.    This morning patient was significantly hypoglycemic with a CBG 51.  Denies missing her meals.  States she had a pretty good dinner last night prior to going to bed.  We will continue to adjust her insulin to avoid hypoglycemia.  04/18/2018: Patient seen and examined at bedside.  No acute events  overnight.  No hypoglycemia.  Reports severe pain in her right jaw.  CT face unremarkable.  Advised to follow-up with her dentist.  The day of discharge, the patient was hemodynamically stable.  She will need to follow-up with her primary care provider and dentist post hospitalization.    Hospital Course:  Active Problems:   Diabetic ketoacidosis ([place])  Uncontrolled type I DKA complicated by hypoglycemia DKA physiology has resolved Patient expresses preference to Levemir rather than her Humulin-States she now has insurance to afford it Hemoglobin A1c 9.6 on 04/16/2018 from 11.30 December 2017 Currently on Levemir 10 mg twice daily NovoLog 5 units before meals and insulin sliding scale Follow-up with your PCP outpatient  Right back lower tooth ache CT face unremarkable for any soft tissue swelling or abscesses. No sign of systemic infection Follow-up with your dentist outpatient  Resolved severe hypophosphatemia Repleted with IV phosphorus  Resolved severe metabolic acidosis secondary to type I DKA versus others Repleted with isotonic bicarb  Resolved pleuritic chest pain CTA negative for PE No sign of acute ischemia on EKG First set troponin negative  Generalized anxiety Follow-up with your PCP outpatient Follow-up with psychiatry outpatient  Resolved hyperkalemia  Resolved hypokalemia post repletion   Code Status: Full code   Consultants:  Diabetes coordinator  Procedures:  None  Antimicrobials:  None   Discharge Exam: BP (!) 145/98   Pulse (!) 103   Temp 98.2 F (36.8 C) (Oral)   Resp 20   Ht 5' 6"  (1.676 m)   Wt 67.9 kg   LMP 04/08/2018 (Approximate)  SpO2 100%   BMI 24.16 kg/m  . General: 32 y.o. year-old female well developed well nourished in no acute distress.  Alert and oriented x3. . Cardiovascular: Regular rate and rhythm with no rubs or gallops.  No thyromegaly or JVD noted.   Marland Kitchen Respiratory: Clear to auscultation with no wheezes  or rales. Good inspiratory effort. . Abdomen: Soft nontender nondistended with normal bowel sounds x4 quadrants. . Musculoskeletal: No lower extremity edema. 2/4 pulses in all 4 extremities. . Skin: No ulcerative lesions noted or rashes, . Psychiatry: Mood is appropriate for condition and setting  Discharge Instructions You were cared for by a hospitalist during your hospital stay. If you have any questions about your discharge medications or the care you received while you were in the hospital after you are discharged, you can call the unit and asked to speak with the hospitalist on call if the hospitalist that took care of you is not available. Once you are discharged, your primary care physician will handle any further medical issues. Please note that NO REFILLS for any discharge medications will be authorized once you are discharged, as it is imperative that you return to your primary care physician (or establish a relationship with a primary care physician if you do not have one) for your aftercare needs so that they can reassess your need for medications and monitor your lab values.   Allergies as of 04/18/2018   No Known Allergies     Medication List    STOP taking these medications   guaiFENesin 600 MG 12 hr tablet Commonly known as:  MUCINEX   insulin NPH Human 100 UNIT/ML injection Commonly known as:  HUMULIN N,NOVOLIN N     TAKE these medications   blood glucose meter kit and supplies Kit Dispense based on patient and insurance preference. Use up to four times daily as directed. (FOR ICD-9 250.00, 250.01).   glucose blood test strip Use as instructed   insulin aspart 100 UNIT/ML injection Commonly known as:  novoLOG Inject 0-9 Units into the skin 3 (three) times daily with meals. What changed:    how much to take  when to take this   insulin aspart 100 UNIT/ML injection Commonly known as:  novoLOG Inject 0-5 Units into the skin at bedtime. What changed:  You were  already taking a medication with the same name, and this prescription was added. Make sure you understand how and when to take each.   insulin aspart 100 UNIT/ML injection Commonly known as:  novoLOG Inject 5 Units into the skin 3 (three) times daily with meals. What changed:  You were already taking a medication with the same name, and this prescription was added. Make sure you understand how and when to take each.   insulin detemir 100 UNIT/ML injection Commonly known as:  LEVEMIR Inject 0.1 mLs (10 Units total) into the skin 2 (two) times daily.   INSULIN SYRINGE .5CC/29G 29G X 1/2" 0.5 ML Misc 1 each by Does not apply route 4 (four) times daily - after meals and at bedtime.   Lancet Device Misc 1 each by Does not apply route 4 (four) times daily - after meals and at bedtime.   oxyCODONE 5 MG immediate release tablet Commonly known as:  Oxy IR/ROXICODONE Take 1 tablet (5 mg total) by mouth 2 (two) times daily as needed for severe pain or breakthrough pain.   TRUE METRIX AIR GLUCOSE METER Devi 1 each by Does not apply route 4 (four) times  daily -  with meals and at bedtime.      No Known Allergies Follow-up Information    [name], FNP. Call in 1 day(s).   Specialty:  Family Medicine Why:  Please call for a post hospital follow-up appointment Contact information: [address]. [place] [address] [place]. Call in 1 day(s).   Why:  Please call for a post hospital follow-up appointment. Contact information: [address] [place] [address] [phone]            The results of significant diagnostics from this hospitalization (including imaging, microbiology, ancillary and laboratory) are listed below for reference.    Significant Diagnostic Studies: Dg Chest 2 View  Result Date: 04/15/2018 CLINICAL DATA:  Chest pain EXAM: CHEST - 2 VIEW COMPARISON:  06/01/2017 FINDINGS: The heart size and mediastinal contours are  within normal limits. Both lungs are clear. The visualized skeletal structures are unremarkable. IMPRESSION: No active cardiopulmonary disease. Electronically Signed   By: Donavan Foil M.D.   On: 04/15/2018 19:29   Ct Angio Chest Pe W And/or Wo Contrast  Result Date: 04/15/2018 CLINICAL DATA:  Chest pain and shortness of breath for 3 days. EXAM: CT ANGIOGRAPHY CHEST WITH CONTRAST TECHNIQUE: Multidetector CT imaging of the chest was performed using the standard protocol during bolus administration of intravenous contrast. Multiplanar CT image reconstructions and MIPs were obtained to evaluate the vascular anatomy. CONTRAST:  110m ISOVUE-370 IOPAMIDOL (ISOVUE-370) INJECTION 76% COMPARISON:  Chest radiograph April 15, 2018 and CT chest April 11, 2014 FINDINGS: CARDIOVASCULAR: Adequate contrast opacification of the pulmonary artery's. Main pulmonary artery is not enlarged. No pulmonary arterial filling defects to the level of the subsegmental branches. Heart size is normal, no right heart strain. No pericardial effusion. Thoracic aorta is normal course and caliber, unremarkable. MEDIASTINUM/NODES: No lymphadenopathy by CT size criteria. Small amount of similar residual thymic tissue. LUNGS/PLEURA: Tracheobronchial tree is patent, no pneumothorax. No pleural effusions, focal consolidations, pulmonary nodules or masses. UPPER ABDOMEN: Non-acute.  Mild suspected hepatic steatosis. MUSCULOSKELETAL: Non-acute.  Bilateral breast piercings. Review of the MIP images confirms the above findings. IMPRESSION: Negative CT angiogram chest. Electronically Signed   By: CElon AlasM.D.   On: 04/15/2018 22:33   Ct Maxillofacial W Contrast  Result Date: 04/18/2018 CLINICAL DATA:  32year old female with diabetes. Toothache. Facial swelling. EXAM: CT MAXILLOFACIAL WITH CONTRAST TECHNIQUE: Multidetector CT imaging of the maxillofacial structures was performed with intravenous contrast. Multiplanar CT image  reconstructions were also generated. CONTRAST:  1043mISOVUE-300 IOPAMIDOL (ISOVUE-300) INJECTION 61% COMPARISON:  Head CT without contrast 04/25/2016. FINDINGS: Osseous: Intact mandible. Right maxillary bicuspid dental implant appears to be missing the crown component. Evidence of posterior left maxillary molar dental caries. No definite acute dental finding. Intact maxilla. Other facial bones appear intact. Skull base and visible cervical spine appear intact. Orbits: Intact orbital walls. Orbits soft tissues appear symmetric and normal. Sinuses: Clear aside from minimal left maxillary alveolar recess mucosal thickening. Tympanic cavities and mastoids are clear. Soft tissues: Visible thyroid, larynx, pharynx, parapharyngeal spaces, retropharyngeal space, sublingual space, submandibular glands, and parotid glands are within normal limits. The masticator spaces appear normal. No soft tissue fluid collection, perioral or superficial soft tissue inflammation identified. Upper cervical lymph nodes appear within normal limits. The major vascular structures in the visible neck and skull base are patent. The left vertebral artery is dominant. Limited intracranial: Negative. IMPRESSION: Left maxilla posterior molar dental caries  with no soft tissue infection identified. Otherwise negative face CT. Electronically Signed   By: Genevie Ann M.D.   On: 04/18/2018 09:30    Microbiology: Recent Results (from the past 240 hour(s))  MRSA PCR Screening     Status: None   Collection Time: 04/16/18  1:36 AM  Result Value Ref Range Status   MRSA by PCR NEGATIVE NEGATIVE Final    Comment:        The GeneXpert MRSA Assay (FDA approved for NASAL specimens only), is one component of a comprehensive MRSA colonization surveillance program. It is not intended to diagnose MRSA infection nor to guide or monitor treatment for MRSA infections. Performed at [facility] [place] [address] [place], [place] [address]    Group A Strep by PCR     Status: None   Collection Time: 04/17/18 11:42 AM  Result Value Ref Range Status   Group A Strep by PCR NOT DETECTED NOT DETECTED Final    Comment: Performed at [facility], [place] [address], [place],  [address]     Labs: Basic Metabolic Panel: Recent Labs  Lab 04/16/18 0725 04/16/18 1237 04/16/18 1634 04/17/18 0333 04/18/18 0734  NA 138 137 136 140 142  K 4.4 4.0 4.1 3.4* 3.8  CL 113* 109 108 104 105  CO2 16* 16* 18* 27 27  GLUCOSE 99 200* 200* 165* 121*  BUN 11 9 13 14 12   CREATININE 0.62 0.67 0.72 0.55 0.57  CALCIUM 8.7* 8.7* 8.6* 8.3* 8.9  MG  --   --  1.9  --   --   PHOS  --   --  1.2* 3.3  --    Liver Function Tests: Recent Labs  Lab 04/15/18 1928  AST 16  ALT 16  ALKPHOS 55  BILITOT 1.6*  PROT 8.1  ALBUMIN 4.3   Recent Labs  Lab 04/15/18 1928  LIPASE 19   No results for input(s): AMMONIA in the last 168 hours. CBC: Recent Labs  Lab 04/15/18 0857  WBC 9.0  NEUTROABS 6.7  HGB 13.8  HCT 42.5  MCV 87.8  PLT 364   Cardiac Enzymes: Recent Labs  Lab 04/16/18 0103 04/16/18 0725 04/16/18 1237  TROPONINI <0.03 <0.03 <0.03   BNP: BNP (last 3 results) No results for input(s): BNP in the last 8760 hours.  ProBNP (last 3 results) No results for input(s): PROBNP in the last 8760 hours.  CBG: Recent Labs  Lab 04/17/18 1134 04/17/18 1708 04/17/18 2019 04/18/18 0754 04/18/18 1158  GLUCAP 196* 151* 210* 131* 198*       Signed:  Kayleen Memos, MD [facility] 04/18/2018, 12:37 PM

## 2018-04-18 NOTE — Progress Notes (Signed)
Pt will be leaving this afternoon. Discharge instructions/prescriptions given/explained with pt verbalizing understanding.  Pt aware to followup with [facility] for anxiety issues. Letter for work given to pt. Pt drove herself to hospital and will drive herself home.

## 2018-04-18 NOTE — Discharge Instructions (Signed)
Diabetic Ketoacidosis °Diabetic ketoacidosis is a life-threatening complication of diabetes. If it is not treated, it can cause severe dehydration and organ damage and can lead to a coma or death. °What are the causes? °This condition develops when there is not enough of the hormone insulin in the body. Insulin helps the body to break down sugar for energy. Without insulin, the body cannot break down sugar, so it breaks down fats instead. This leads to the production of acids that are called ketones. Ketones are poisonous at high levels. °This condition can be triggered by: °· Stress on the body that is brought on by an illness. °· Medicines that raise blood glucose levels. °· Not taking diabetes medicine. ° °What are the signs or symptoms? °Symptoms of this condition include: °· Fatigue. °· Weight loss. °· Excessive thirst. °· Light-headedness. °· Fruity or sweet-smelling breath. °· Excessive urination. °· Vision changes. °· Confusion or irritability. °· Nausea. °· Vomiting. °· Rapid breathing. °· Abdominal pain. °· Feeling flushed. ° °How is this diagnosed? °This condition is diagnosed based on a medical history, a physical exam, and blood tests. You may also have a urine test that checks for ketones. °How is this treated? °This condition may be treated with: °· Fluid replacement. This may be done to correct dehydration. °· Insulin injections. These may be given through the skin or through an IV tube. °· Electrolyte replacement. Electrolytes, such as potassium and sodium, may be given in pill form or through an IV tube. °· Antibiotic medicines. These may be prescribed if your condition was caused by an infection. ° °Follow these instructions at home: °Eating and drinking °· Drink enough fluids to keep your urine clear or pale yellow. °· If you cannot eat, alternate between drinking fluids with sugar (such as juice) and salty fluids (such as broth or bouillon). °· If you can eat, follow your usual diet and drink  sugar-free liquids, such as water. °Other Instructions ° °· Take insulin as directed by your health care provider. Do not skip insulin injections. Do not use expired insulin. °· If your blood sugar is over 240 mg/dL, monitor your urine ketones every 4-6 hours. °· If you were prescribed an antibiotic medicine, finish all of it even if you start to feel better. °· Rest and exercise only as directed by your health care provider. °· If you get sick, call your health care provider and begin treatment quickly. Your body often needs extra insulin to fight an illness. °· Check your blood glucose levels regularly. If your blood glucose is high, drink plenty of fluids. This helps to flush out ketones. °Contact a health care provider if: °· Your blood glucose level is too high or too low. °· You have ketones in your urine. °· You have a fever. °· You cannot eat. °· You cannot tolerate fluids. °· You have been vomiting for more than 2 hours. °· You continue to have symptoms of this condition. °· You develop new symptoms. °Get help right away if: °· Your blood glucose levels continue to be high (elevated). °· Your monitor reads “high” even when you are taking insulin. °· You faint. °· You have chest pain. °· You have trouble breathing. °· You have a sudden, severe headache. °· You have sudden weakness in one arm or one leg. °· You have sudden trouble speaking or swallowing. °· You have vomiting or diarrhea that gets worse after 3 hours. °· You feel severely fatigued. °· You have trouble thinking. °· You   have abdominal pain.  You are severely dehydrated. Symptoms of severe dehydration include: ? Extreme thirst. ? Dry mouth. ? Blue lips. ? Cold hands and feet. ? Rapid breathing. This information is not intended to replace advice given to you by your health care provider. Make sure you discuss any questions you have with your health care provider. Document Released: 07/11/2000 Document Revised: 12/20/2015 Document  Reviewed: 06/21/2014 Elsevier Interactive Patient Education  2017 [place].   Insulin Storage and Care All insulin pens and bottles (vials) have expiration dates. Refrigerated, unopened insulin pens, insulin cartridges, and insulin vials are good until the expiration date. Do not use insulin after this date. Once insulin is opened, it should be used within a certain time period. Opened means once the rubber is punctured. Always follow the instructions that come with your insulin. Storing and caring for your insulin  If insulin is kept at room temperature, the temperature must be less than 74F (30C). Some types of insulin can be stored only at less than 54F (25C).  If insulin is kept in the refrigerator, the temperature must be between 84F and 75F (3C and 8C).  Do not freeze insulin.  Keep insulin away from direct heat or sunlight.  Throw away the insulin if it is discolored, thick, or has clumps or suspended white particles in it.  Be sure to mix cloudy insulin by rolling between your hands gently. Pens can be rocked from end to end.  Opened insulin pens should be kept at room temperature.  Always have extra insulin on hand.  Never leave insulin in your vehicle. This information is not intended to replace advice given to you by your health care provider. Make sure you discuss any questions you have with your health care provider. Document Released: 05/11/2009 Document Revised: 12/20/2015 Document Reviewed: 12/02/2012 Elsevier Interactive Patient Education  2017 [employer].

## 2018-06-01 ENCOUNTER — Ambulatory Visit: Payer: Self-pay | Admitting: Family Medicine

## 2018-06-21 ENCOUNTER — Telehealth: Payer: Self-pay

## 2018-06-21 ENCOUNTER — Ambulatory Visit
Admission: RE | Admit: 2018-06-21 | Discharge: 2018-06-21 | Disposition: A | Discharge status: Home / Self Care | Payer: No Typology Code available for payment source | Source: Ambulatory Visit | Attending: Family Medicine | Admitting: Family Medicine

## 2018-06-21 ENCOUNTER — Other Ambulatory Visit: Payer: Self-pay | Admitting: Family Medicine

## 2018-06-21 ENCOUNTER — Ambulatory Visit
Admission: RE | Admit: 2018-06-21 | Discharge: 2018-06-21 | Disposition: A | Discharge status: Home / Self Care | Payer: 59 | Source: Ambulatory Visit | Attending: Family Medicine | Admitting: Family Medicine

## 2018-06-21 ENCOUNTER — Ambulatory Visit (INDEPENDENT_AMBULATORY_CARE_PROVIDER_SITE_OTHER): Payer: 59 | Admitting: Family Medicine

## 2018-06-21 ENCOUNTER — Encounter: Payer: Self-pay | Admitting: Family Medicine

## 2018-06-21 VITALS — BP 138/88 | HR 100 | Temp 98.6°F | Resp 16 | Ht 66.0 in | Wt 159.0 lb

## 2018-06-21 DIAGNOSIS — Q839 Congenital malformation of breast, unspecified: Secondary | ICD-10-CM

## 2018-06-21 DIAGNOSIS — Z6825 Body mass index (BMI) 25.0-25.9, adult: Secondary | ICD-10-CM

## 2018-06-21 DIAGNOSIS — E10649 Type 1 diabetes mellitus with hypoglycemia without coma: Secondary | ICD-10-CM | POA: Diagnosis not present

## 2018-06-21 DIAGNOSIS — Z113 Encounter for screening for infections with a predominantly sexual mode of transmission: Secondary | ICD-10-CM

## 2018-06-21 LAB — POCT URINALYSIS DIPSTICK
Bilirubin, UA: NEGATIVE
Blood, UA: NEGATIVE
Glucose, UA: NEGATIVE
Ketones, UA: NEGATIVE
Leukocytes, UA: NEGATIVE
Nitrite, UA: NEGATIVE
Protein, UA: NEGATIVE
Spec Grav, UA: 1.015 (ref 1.010–1.025)
Urobilinogen, UA: 0.2 E.U./dL
pH, UA: 5.5 (ref 5.0–8.0)

## 2018-06-21 LAB — GLUCOSE, POCT (MANUAL RESULT ENTRY): POC Glucose: 107 mg/dl — AB (ref 70–99)

## 2018-06-21 LAB — POCT GLYCOSYLATED HEMOGLOBIN (HGB A1C): Hemoglobin A1C: 9.5 % — AB (ref 4.0–5.6)

## 2018-06-21 NOTE — Patient Instructions (Addendum)
Calorie Counting for Weight Loss Calories are units of energy. Your body needs a certain amount of calories from food to keep you going throughout the day. When you eat more calories than your body needs, your body stores the extra calories as fat. When you eat fewer calories than your body needs, your body burns fat to get the energy it needs. Calorie counting means keeping track of how many calories you eat and drink each day. Calorie counting can be helpful if you need to lose weight. If you make sure to eat fewer calories than your body needs, you should lose weight. Ask your health care provider what a healthy weight is for you. For calorie counting to work, you will need to eat the right number of calories in a day in order to lose a healthy amount of weight per week. A dietitian can help you determine how many calories you need in a day and will give you suggestions on how to reach your calorie goal.  A healthy amount of weight to lose per week is usually 1-2 lb (0.5-0.9 kg). This usually means that your daily calorie intake should be reduced by 500-750 calories.  Eating 1,200 - 1,500 calories per day can help most women lose weight.  Eating 1,500 - 1,800 calories per day can help most men lose weight.  What is my plan? My goal is to have __________ calories per day. If I have this many calories per day, I should lose around __________ pounds per week. What do I need to know about calorie counting? In order to meet your daily calorie goal, you will need to:  Find out how many calories are in each food you would like to eat. Try to do this before you eat.  Decide how much of the food you plan to eat.  Write down what you ate and how many calories it had. Doing this is called keeping a food log.  To successfully lose weight, it is important to balance calorie counting with a healthy lifestyle that includes regular activity. Aim for 150 minutes of moderate exercise (such as walking) or  75 minutes of vigorous exercise (such as running) each week. Where do I find calorie information?  The number of calories in a food can be found on a Nutrition Facts label. If a food does not have a Nutrition Facts label, try to look up the calories online or ask your dietitian for help. Remember that calories are listed per serving. If you choose to have more than one serving of a food, you will have to multiply the calories per serving by the amount of servings you plan to eat. For example, the label on a package of bread might say that a serving size is 1 slice and that there are 90 calories in a serving. If you eat 1 slice, you will have eaten 90 calories. If you eat 2 slices, you will have eaten 180 calories. How do I keep a food log? Immediately after each meal, record the following information in your food log:  What you ate. Don't forget to include toppings, sauces, and other extras on the food.  How much you ate. This can be measured in cups, ounces, or number of items.  How many calories each food and drink had.  The total number of calories in the meal.  Keep your food log near you, such as in a small notebook in your pocket, or use a mobile app or website.  Some programs will calculate calories for you and show you how many calories you have left for the day to meet your goal. What are some calorie counting tips?  Use your calories on foods and drinks that will fill you up and not leave you hungry: ? Some examples of foods that fill you up are nuts and nut butters, vegetables, lean proteins, and high-fiber foods like whole grains. High-fiber foods are foods with more than 5 g fiber per serving. ? Drinks such as sodas, specialty coffee drinks, alcohol, and juices have a lot of calories, yet do not fill you up.  Eat nutritious foods and avoid empty calories. Empty calories are calories you get from foods or beverages that do not have many vitamins or protein, such as candy, sweets,  and soda. It is better to have a nutritious high-calorie food (such as an avocado) than a food with few nutrients (such as a bag of chips).  Know how many calories are in the foods you eat most often. This will help you calculate calorie counts faster.  Pay attention to calories in drinks. Low-calorie drinks include water and unsweetened drinks.  Pay attention to nutrition labels for "low fat" or "fat free" foods. These foods sometimes have the same amount of calories or more calories than the full fat versions. They also often have added sugar, starch, or salt, to make up for flavor that was removed with the fat.  Find a way of tracking calories that works for you. Get creative. Try different apps or programs if writing down calories does not work for you. What are some portion control tips?  Know how many calories are in a serving. This will help you know how many servings of a certain food you can have.  Use a measuring cup to measure serving sizes. You could also try weighing out portions on a kitchen scale. With time, you will be able to estimate serving sizes for some foods.  Take some time to put servings of different foods on your favorite plates, bowls, and cups so you know what a serving looks like.  Try not to eat straight from a bag or box. Doing this can lead to overeating. Put the amount you would like to eat in a cup or on a plate to make sure you are eating the right portion.  Use smaller plates, glasses, and bowls to prevent overeating.  Try not to multitask (for example, watch TV or use your computer) while eating. If it is time to eat, sit down at a table and enjoy your food. This will help you to know when you are full. It will also help you to be aware of what you are eating and how much you are eating. What are tips for following this plan? Reading food labels  Check the calorie count compared to the serving size. The serving size may be smaller than what you are used  to eating.  Check the source of the calories. Make sure the food you are eating is high in vitamins and protein and low in saturated and trans fats. Shopping  Read nutrition labels while you shop. This will help you make healthy decisions before you decide to purchase your food.  Make a grocery list and stick to it. Cooking  Try to cook your favorite foods in a healthier way. For example, try baking instead of frying.  Use low-fat dairy products. Meal planning  Use more fruits and vegetables. Half of your plate  should be fruits and vegetables.  Include lean proteins like poultry and fish. How do I count calories when eating out?  Ask for smaller portion sizes.  Consider sharing an entree and sides instead of getting your own entree.  If you get your own entree, eat only half. Ask for a box at the beginning of your meal and put the rest of your entree in it so you are not tempted to eat it.  If calories are listed on the menu, choose the lower calorie options.  Choose dishes that include vegetables, fruits, whole grains, low-fat dairy products, and lean protein.  Choose items that are boiled, broiled, grilled, or steamed. Stay away from items that are buttered, battered, fried, or served with cream sauce. Items labeled "crispy" are usually fried, unless stated otherwise.  Choose water, low-fat milk, unsweetened iced tea, or other drinks without added sugar. If you want an alcoholic beverage, choose a lower calorie option such as a glass of wine or light beer.  Ask for dressings, sauces, and syrups on the side. These are usually high in calories, so you should limit the amount you eat.  If you want a salad, choose a garden salad and ask for grilled meats. Avoid extra toppings like bacon, cheese, or fried items. Ask for the dressing on the side, or ask for olive oil and vinegar or lemon to use as dressing.  Estimate how many servings of a food you are given. For example, a serving  of cooked rice is  cup or about the size of half a baseball. Knowing serving sizes will help you be aware of how much food you are eating at restaurants. The list below tells you how big or small some common portion sizes are based on everyday objects: ? 1 oz-4 stacked dice. ? 3 oz-1 deck of cards. ? 1 tsp-1 die. ? 1 Tbsp- a ping-pong ball. ? 2 Tbsp-1 ping-pong ball. ?  cup- baseball. ? 1 cup-1 baseball. Summary  Calorie counting means keeping track of how many calories you eat and drink each day. If you eat fewer calories than your body needs, you should lose weight.  A healthy amount of weight to lose per week is usually 1-2 lb (0.5-0.9 kg). This usually means reducing your daily calorie intake by 500-750 calories.  The number of calories in a food can be found on a Nutrition Facts label. If a food does not have a Nutrition Facts label, try to look up the calories online or ask your dietitian for help.  Use your calories on foods and drinks that will fill you up, and not on foods and drinks that will leave you hungry.  Use smaller plates, glasses, and bowls to prevent overeating. This information is not intended to replace advice given to you by your health care provider. Make sure you discuss any questions you have with your health care provider. Document Released: 07/14/2005 Document Revised: 06/13/2016 Document Reviewed: 06/13/2016 Elsevier Interactive Patient Education  2018 [place] [employer] Practicing safe sex means taking steps before and during sex to reduce your risk of:  Getting an STD (sexually transmitted disease).  Giving your partner an STD.  Unwanted pregnancy.  How can I practice safe sex?  To practice safe sex:  Limit your sexual partners to only one partner who is having sex with only you.  Avoid using alcohol and recreational drugs before having sex. These substances can affect your judgment.  Before having sex with a new  partner: ? Talk to  your partner about past partners, past STDs, and drug use. ? You and your partner should be screened for STDs and discuss the results with each other.  Check your body regularly for sores, blisters, rashes, or unusual discharge. If you notice any of these problems, visit your health care provider.  If you have symptoms of an infection or you are being treated for an STD, avoid sexual contact.  While having sex, use a condom. Make sure to: ? Use a condom every time you have vaginal, oral, or anal sex. Both females and males should wear condoms during oral sex. ? Keep condoms in place from the beginning to the end of sexual activity. ? Use a latex condom, if possible. Latex condoms offer the best protection. ? Use only water-based lubricants or oils to lubricate a condom. Using petroleum-based lubricants or oils will weaken the condom and increase the chance that it will break.  See your health care provider for regular screenings, exams, and tests for STDs.  Talk with your health care provider about the form of birth control (contraception) that is best for you.  Get vaccinated against hepatitis B and human papillomavirus (HPV).  If you are at risk of being infected with HIV (human immunodeficiency virus), talk with your health care provider about taking a prescription medicine to prevent HIV infection. You are considered at risk for HIV if: ? You are a man who has sex with other men. ? You are a heterosexual man or woman who is sexually active with more than one partner. ? You take drugs by injection. ? You are sexually active with a partner who has HIV.  This information is not intended to replace advice given to you by your health care provider. Make sure you discuss any questions you have with your health care provider. Document Released: 08/21/2004 Document Revised: 11/28/2015 Document Reviewed: 06/03/2015 Elsevier Interactive Patient Education  [employer].

## 2018-06-21 NOTE — Telephone Encounter (Signed)
Called and spoke with patient, advised that lump was most likely a lipoma and if this continues to be bothersome to let us know and we can refer to general surgery for removal. Patient verbalized understanding. Thanks!

## 2018-06-21 NOTE — Telephone Encounter (Signed)
-----   Message from Lanae Boast, [place] sent at 06/21/2018  2:05 PM EST ----- The lump under the left axilla is most likely a lipoma. If this continues to be bothersome, can refer to general surgery for removal.

## 2018-06-21 NOTE — Progress Notes (Signed)
Established Patient Office Visit  Subjective:  Patient ID: [name], female    DOB: 05-May-1986  Age: 32 y.o. MRN: [id number]  CC:  Chief Complaint  Patient presents with  . Diabetes  . Exposure to STD    wants std testing     HPI [name] presents for follow up on DM1. Patient hospitalized 04/18/2018 for DKA. Patient states that her FBS are usually in 99s. Patient states that her sugars have improved since leaving a stressful job.  Patient also reports having a knot in the left axillary/breast that has been present x 2-3 years. Patient states that her the knot has increased in size and is moving to the axilla and breast region. Would like further evaluation.  Patient also states that she would like to have STD testing. Denies symptoms. Patient reports that she is followed by GYN for her pap smears and Gyn needs.   Past Medical History:  Diagnosis Date  . Diabetes mellitus without complication ([place])     History reviewed. No pertinent surgical history.  Family History  Problem Relation Age of Onset  . Throat cancer Mother   . Diabetes Maternal Aunt     Social History   Socioeconomic History  . Marital status: Single    Spouse name: Not on file  . Number of children: Not on file  . Years of education: Not on file  . Highest education level: Not on file  Occupational History  . Not on file  Social Needs  . Financial resource strain: Not on file  . Food insecurity:    Worry: Not on file    Inability: Not on file  . Transportation needs:    Medical: Not on file    Non-medical: Not on file  Tobacco Use  . Smoking status: Passive Smoke Exposure - Never Smoker  . Smokeless tobacco: Never Used  Substance and Sexual Activity  . Alcohol use: Yes    Comment: occasionally   . Drug use: No  . Sexual activity: Not Currently  Lifestyle  . Physical activity:    Days per week: Not on file    Minutes per session: Not on file  . Stress: Not on file    Relationships  . Social connections:    Talks on phone: Not on file    Gets together: Not on file    Attends religious service: Not on file    Active member of club or organization: Not on file    Attends meetings of clubs or organizations: Not on file    Relationship status: Not on file  . Intimate partner violence:    Fear of current or ex partner: Not on file    Emotionally abused: Not on file    Physically abused: Not on file    Forced sexual activity: Not on file  Other Topics Concern  . Not on file  Social History Narrative  . Not on file    Outpatient Medications Prior to Visit  Medication Sig Dispense Refill  . Ascorbic Acid (VITAMIN C) 100 MG tablet Take 100 mg by mouth daily.    . blood glucose meter kit and supplies KIT Dispense based on patient and insurance preference. Use up to four times daily as directed. (FOR ICD-9 250.00, 250.01). 1 each 0  . Blood Glucose Monitoring Suppl (TRUE METRIX AIR GLUCOSE METER) DEVI 1 each by Does not apply route 4 (four) times daily -  with meals and at bedtime. 1 Device  0  . glucose blood (TRUE METRIX BLOOD GLUCOSE TEST) test strip Use as instructed 100 each 12  . insulin aspart (NOVOLOG) 100 UNIT/ML injection Inject 0-9 Units into the skin 3 (three) times daily with meals. 30 mL 0  . insulin NPH Human (HUMULIN N,NOVOLIN N) 100 UNIT/ML injection Inject 22 Units into the skin 2 (two) times daily before a meal.    . INSULIN SYRINGE .5CC/29G 29G X 1/2" 0.5 ML MISC 1 each by Does not apply route 4 (four) times daily - after meals and at bedtime. 200 each 5  . Lancet Device MISC 1 each by Does not apply route 4 (four) times daily - after meals and at bedtime. 1 each 12  . Probiotic Product (CULTURELLE PROBIOTICS PO) Take by mouth.    . insulin aspart (NOVOLOG) 100 UNIT/ML injection Inject 0-5 Units into the skin at bedtime. 20 mL 0  . insulin aspart (NOVOLOG) 100 UNIT/ML injection Inject 5 Units into the skin 3 (three) times daily with meals.  30 mL 0  . insulin detemir (LEVEMIR) 100 UNIT/ML injection Inject 0.1 mLs (10 Units total) into the skin 2 (two) times daily. 30 mL 0  . oxyCODONE (OXY IR/ROXICODONE) 5 MG immediate release tablet Take 1 tablet (5 mg total) by mouth 2 (two) times daily as needed for severe pain or breakthrough pain. 10 tablet 0   No facility-administered medications prior to visit.     No Known Allergies  ROS Review of Systems  Constitutional: Negative.   HENT: Negative.   Eyes: Negative.   Respiratory: Negative.   Cardiovascular: Negative.   Gastrointestinal: Negative.   Endocrine: Negative.  Negative for polydipsia, polyphagia and polyuria.       +type 1 DM   Genitourinary: Negative.   Musculoskeletal: Negative.   Skin: Negative.        Lump in the left axilla  Allergic/Immunologic: Negative.   Neurological: Negative.   Hematological: Negative.   Psychiatric/Behavioral: Negative.       Objective:    Physical Exam  Constitutional: She is oriented to person, place, and time. She appears well-developed and well-nourished. No distress.  HENT:  Head: Normocephalic and atraumatic.  Eyes: Pupils are equal, round, and reactive to light. Conjunctivae and EOM are normal.  Neck: Normal range of motion.  Cardiovascular: Normal rate and normal heart sounds.  No murmur heard. Pulmonary/Chest: Effort normal. No respiratory distress.    Abdominal: Soft. Bowel sounds are normal. She exhibits distension.  Musculoskeletal: Normal range of motion.  Neurological: She is alert and oriented to person, place, and time. She has normal reflexes.  Skin: Skin is warm and dry.  Psychiatric: She has a normal mood and affect. Her behavior is normal. Judgment and thought content normal.    BP 138/88 (BP Location: Left Arm, Patient Position: Sitting, Cuff Size: Normal)   Pulse 100   Temp 98.6 F (37 C) (Oral)   Resp 16   Ht 5' 6"  (1.676 m)   Wt 159 lb (72.1 kg)   LMP 05/22/2018   SpO2 100%   BMI 25.66  kg/m  Wt Readings from Last 3 Encounters:  06/21/18 159 lb (72.1 kg)  04/16/18 149 lb 11.1 oz (67.9 kg)  12/28/17 142 lb 13.7 oz (64.8 kg)     Health Maintenance Due  Topic Date Due  . URINE MICROALBUMIN  03/11/2017    There are no preventive care reminders to display for this patient.  Lab Results  Component Value Date   TSH  0.845 07/25/2015   Lab Results  Component Value Date   WBC 9.0 04/15/2018   HGB 13.8 04/15/2018   HCT 42.5 04/15/2018   MCV 87.8 04/15/2018   PLT 364 04/15/2018   Lab Results  Component Value Date   NA 142 04/18/2018   K 3.8 04/18/2018   CO2 27 04/18/2018   GLUCOSE 121 (H) 04/18/2018   BUN 12 04/18/2018   CREATININE 0.57 04/18/2018   BILITOT 1.6 (H) 04/15/2018   ALKPHOS 55 04/15/2018   AST 16 04/15/2018   ALT 16 04/15/2018   PROT 8.1 04/15/2018   ALBUMIN 4.3 04/15/2018   CALCIUM 8.9 04/18/2018   ANIONGAP 10 04/18/2018   Lab Results  Component Value Date   CHOL  12/23/2009    195        ATP III CLASSIFICATION:  <200     mg/dL   Desirable  200-239  mg/dL   Borderline High  >=240    mg/dL   High          Lab Results  Component Value Date   HDL 79 12/23/2009   Lab Results  Component Value Date   LDLCALC  12/23/2009    94        Total Cholesterol/HDL:CHD Risk Coronary Heart Disease Risk Table                     Men   Women  1/2 Average Risk   3.4   3.3  Average Risk       5.0   4.4  2 X Average Risk   9.6   7.1  3 X Average Risk  23.4   11.0        Use the calculated Patient Ratio above and the CHD Risk Table to determine the patient's CHD Risk.        ATP III CLASSIFICATION (LDL):  <100     mg/dL   Optimal  100-129  mg/dL   Near or Above                    Optimal  130-159  mg/dL   Borderline  160-189  mg/dL   High  >190     mg/dL   Very High   Lab Results  Component Value Date   TRIG 108 12/23/2009   Lab Results  Component Value Date   CHOLHDL 2.5 12/23/2009   Lab Results  Component Value Date   HGBA1C  9.5 (A) 06/21/2018      Assessment & Plan:   Problem List Items Addressed This Visit      Endocrine   Diabetes type 1, uncontrolled ([place]) - Primary   Relevant Medications   insulin NPH Human (HUMULIN N,NOVOLIN N) 100 UNIT/ML injection   Other Relevant Orders   Microalbumin, urine   HM Diabetes Foot Exam (Completed)   Glucose (CBG) (Completed)   HgB A1c (Completed)   Urinalysis Dipstick (Completed)   CBC with Differential   Comprehensive metabolic panel    Other Visit Diagnoses    Body mass index (BMI) of 25.0-25.9 in adult       Breast anomaly       Relevant Orders   US BREAST LTD UNI LEFT INC AXILLA (Completed)   Screen for STD (sexually transmitted disease)       Relevant Orders   Chlamydia/GC NAA, Confirmation   HIV antibody (with reflex)   RPR        Follow-up: Return in 3  months (on 09/21/2018).    Lanae Boast, FNP

## 2018-06-22 LAB — COMPREHENSIVE METABOLIC PANEL
ALT: 11 IU/L (ref 0–32)
AST: 15 IU/L (ref 0–40)
Albumin/Globulin Ratio: 1.5 (ref 1.2–2.2)
Albumin: 4.4 g/dL (ref 3.5–5.5)
Alkaline Phosphatase: 63 IU/L (ref 39–117)
BUN/Creatinine Ratio: 20 (ref 9–23)
BUN: 15 mg/dL (ref 6–20)
Bilirubin Total: 0.4 mg/dL (ref 0.0–1.2)
CO2: 19 mmol/L — ABNORMAL LOW (ref 20–29)
Calcium: 9.8 mg/dL (ref 8.7–10.2)
Chloride: 101 mmol/L (ref 96–106)
Creatinine, Ser: 0.75 mg/dL (ref 0.57–1.00)
GFR calc Af Amer: 123 mL/min/{1.73_m2} (ref >59)
GFR calc non Af Amer: 107 mL/min/{1.73_m2} (ref >59)
Globulin, Total: 2.9 g/dL (ref 1.5–4.5)
Glucose: 88 mg/dL (ref 65–99)
Potassium: 3.7 mmol/L (ref 3.5–5.2)
Sodium: 139 mmol/L (ref 134–144)
Total Protein: 7.3 g/dL (ref 6.0–8.5)

## 2018-06-22 LAB — CBC WITH DIFFERENTIAL/PLATELET
Basophils Absolute: 0.1 10*3/uL (ref 0.0–0.2)
Basos: 1 %
EOS (ABSOLUTE): 0.3 10*3/uL (ref 0.0–0.4)
Eos: 5 %
Hematocrit: 40.4 % (ref 34.0–46.6)
Hemoglobin: 13.2 g/dL (ref 11.1–15.9)
Immature Grans (Abs): 0 10*3/uL (ref 0.0–0.1)
Immature Granulocytes: 0 %
Lymphocytes Absolute: 1.7 10*3/uL (ref 0.7–3.1)
Lymphs: 32 %
MCH: 27.8 pg (ref 26.6–33.0)
MCHC: 32.7 g/dL (ref 31.5–35.7)
MCV: 85 fL (ref 79–97)
Monocytes Absolute: 0.5 10*3/uL (ref 0.1–0.9)
Monocytes: 9 %
Neutrophils Absolute: 2.8 10*3/uL (ref 1.4–7.0)
Neutrophils: 53 %
Platelets: 353 10*3/uL (ref 150–450)
RBC: 4.75 x10E6/uL (ref 3.77–5.28)
RDW: 13 % (ref 12.3–15.4)
WBC: 5.3 10*3/uL (ref 3.4–10.8)

## 2018-06-22 LAB — MICROALBUMIN, URINE: Microalbumin, Urine: 7.6 ug/mL

## 2018-06-22 LAB — RPR: RPR Ser Ql: NONREACTIVE

## 2018-06-22 LAB — HIV ANTIBODY (ROUTINE TESTING W REFLEX): HIV Screen 4th Generation wRfx: NONREACTIVE

## 2018-06-23 LAB — CHLAMYDIA/GC NAA, CONFIRMATION
Chlamydia trachomatis, NAA: NEGATIVE
Neisseria gonorrhoeae, NAA: NEGATIVE

## 2018-08-12 ENCOUNTER — Telehealth: Payer: Self-pay

## 2018-08-12 NOTE — Telephone Encounter (Signed)
Patient called and says that she has been anemic in the past (at her ob/gyn) she is asking how much otc iron she should take. Please advise.

## 2018-08-12 NOTE — Telephone Encounter (Signed)
Can take otc iron mg TID. Take with a food or drink that contains Vitamin C. Can cause constipation.  Can also take SlowFE that is once a day.

## 2018-08-12 NOTE — Telephone Encounter (Signed)
Called and spoke with patient, advised that she needs to take otc iron three times daily with vitmain C. patient verbalized understanding. Thanks!

## 2018-08-23 ENCOUNTER — Telehealth: Payer: Self-pay

## 2018-08-23 DIAGNOSIS — E10649 Type 1 diabetes mellitus with hypoglycemia without coma: Secondary | ICD-10-CM

## 2018-08-23 NOTE — Telephone Encounter (Signed)
Referral placed.

## 2018-08-23 NOTE — Telephone Encounter (Signed)
Patient is asking for a referral to Endocrinology for [name] for her diabetes. Can this be placed? Last appointment was 05/2018 and she has one coming up 08/2018. Please advise.

## 2018-08-24 NOTE — Telephone Encounter (Signed)
Called, no answer. Left a message that we have sent in referral requested. Thanks!

## 2018-09-02 ENCOUNTER — Ambulatory Visit: Payer: Self-pay | Admitting: Family Medicine

## 2018-09-02 ENCOUNTER — Encounter

## 2018-09-13 ENCOUNTER — Ambulatory Visit: Payer: Self-pay | Admitting: Internal Medicine

## 2018-09-13 ENCOUNTER — Encounter: Payer: Self-pay | Admitting: Internal Medicine

## 2018-09-13 VITALS — BP 118/76 | HR 90 | Resp 16 | Ht 66.0 in | Wt 165.2 lb

## 2018-09-13 DIAGNOSIS — E1065 Type 1 diabetes mellitus with hyperglycemia: Secondary | ICD-10-CM

## 2018-09-13 MED ORDER — INSULIN DEGLUDEC 100 UNIT/ML ~~LOC~~ SOLN: 15.0000 [IU] | Freq: Every day | SUBCUTANEOUS | 11 refills | Status: DC

## 2018-09-13 MED ORDER — INSULIN ASPART 100 UNIT/ML ~~LOC~~ SOLN: 5.0000 [IU] | Freq: Three times a day (TID) | SUBCUTANEOUS | 0 refills | Status: DC
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
## 2018-09-13 NOTE — Patient Instructions (Addendum)
-   I will send you a prescription for Tresiba (long acting insulin ) Take 15 units once adaily, but if you stay on Humulin-N Decrease it to  10 units Twice a day   - Take Novolog 5 units with Each meal  - check out the Rel[employer] meter from [facility]   - Avoid snacks if you can   - Check sugar 4 times ad ay before meals and bedtime  - Choose healthy, lower carb lower calorie snacks: toss salad, cooked vegetables, cottage cheese, peanut butter, low fat cheese / string cheese, lower sodium deli meat, tuna salad or chicken salad   - HOW TO TREAT LOW BLOOD SUGARS (Blood sugar LESS THAN 70 MG/DL)  Please follow the RULE OF 15 for the treatment of hypoglycemia treatment (when your (blood sugars are less than 70 mg/dL)    STEP 1: Take 15 grams of carbohydrates when your blood sugar is low, which includes:   3-4 GLUCOSE TABS  OR  3-4 OZ OF JUICE OR REGULAR SODA OR  ONE TUBE OF GLUCOSE GEL     STEP 2: RECHECK blood sugar in 15 MINUTES STEP 3: If your blood sugar is still low at the 15 minute recheck --> then, go back to STEP 1 and treat AGAIN with another 15 grams of carbohydrates.

## 2018-09-17 ENCOUNTER — Telehealth: Payer: Self-pay | Admitting: Internal Medicine

## 2018-09-17 NOTE — Telephone Encounter (Signed)
Spoke with Dr. Kelton Pillar and she stated that the pt's blood sugars have been consistently high in these ranges already, and the fluctuations are likely causing her to feel bad because her body is not used to blood sugar levels in the 150 range. Pt stated that she feels like she is going into DKA. At this time, she was advised to go to an Urgent Care and if they direct her to go to the ER, then she should do so, but at this time, Dr. Kelton Pillar does not want to make any medication changes. Pt verbalized understanding.

## 2018-09-17 NOTE — Telephone Encounter (Signed)
Patient called re: patient has been having high blood sugars since changing dosage of Humalin N and Novalog earlier this week (highest was 400) after her appointment with Dr. Kelton Pillar (right now blood sugars 150). Patient does not feel well. Please call patient at ph# [phone] to advise.

## 2018-09-17 NOTE — Telephone Encounter (Signed)
Attempted to call the patient on 09/17/2018 at 20:38 with no answer.   I left her a message urging her to go to the ED or nearest urgent care if she has not done so , if she feels she has DKA.  In the meantime , she was advised to increase Novolin-N to 12 units BID   Pt advised to call back on Monday should she continue to have issues.     Abby Nena Jordan, MD  [facility] Endocrinology  [facility] [address] [place], [place] [address] Phone: [phone] FAX: [phone]

## 2018-09-21 ENCOUNTER — Telehealth: Payer: Self-pay

## 2018-09-21 NOTE — Telephone Encounter (Signed)
Pt approved for assistance from [facility] for 12 months of assistance with medications [id number]

## 2018-09-22 ENCOUNTER — Ambulatory Visit: Payer: No Typology Code available for payment source | Admitting: Family Medicine

## 2018-09-22 NOTE — Telephone Encounter (Signed)
I finished the paperwork for this patient.

## 2018-09-23 ENCOUNTER — Telehealth: Payer: Self-pay

## 2018-09-23 NOTE — Telephone Encounter (Signed)
Received shipment of [place] from [employer]. 2 Boxes of Basaglar were delivered. Pt was called and voicemail left for her informing her that some medication was delivered to this office.

## 2018-09-24 ENCOUNTER — Telehealth: Payer: Self-pay | Admitting: Internal Medicine

## 2018-09-24 ENCOUNTER — Other Ambulatory Visit: Payer: Self-pay

## 2018-09-24 NOTE — Telephone Encounter (Signed)
Pt called back and was informed of Dr. Quin Hoop dose of 16units of Basaglar once daily. Pt verbalized understanding.

## 2018-09-24 NOTE — Telephone Encounter (Signed)
Called and left voicemail requesting a call back

## 2018-09-24 NOTE — Telephone Encounter (Signed)
Patient picked up medication but requests to be called at ph# [phone] re: dosage directions (patient has never used [employer] before). Please call patient at the above ph# to advise.

## 2018-09-24 NOTE — Telephone Encounter (Signed)
Pt picked up her basaglar that was delivered to this office from patient assistance, and she is inquiring about directions. Can you please provide dosage for the pt's basaglar?

## 2018-09-24 NOTE — Telephone Encounter (Signed)
Patient picked up envelope containing medication 09/24/18  At 12:112 pm

## 2018-09-28 ENCOUNTER — Other Ambulatory Visit: Payer: Self-pay | Admitting: Internal Medicine

## 2018-09-28 ENCOUNTER — Other Ambulatory Visit: Payer: Self-pay

## 2018-09-28 ENCOUNTER — Telehealth: Payer: Self-pay

## 2018-09-28 MED ORDER — INSULIN LISPRO 100 UNIT/ML ~~LOC~~ SOLN: 5.0000 [IU] | Freq: Three times a day (TID) | SUBCUTANEOUS | 11 refills | Status: DC

## 2018-09-28 MED ORDER — INSULIN DEGLUDEC 100 UNIT/ML ~~LOC~~ SOLN: 15.0000 [IU] | Freq: Every day | SUBCUTANEOUS | 11 refills | Status: DC

## 2018-09-28 NOTE — Telephone Encounter (Signed)
humalog was added to pt chart and rx faxed to [place] [facility] for them to ship supplies to our office for pt, she was previously approved for basaglar and humalog

## 2018-10-04 ENCOUNTER — Encounter: Payer: Self-pay | Admitting: Internal Medicine

## 2018-10-04 ENCOUNTER — Ambulatory Visit: Payer: Self-pay | Admitting: Internal Medicine

## 2018-10-04 VITALS — BP 118/78 | HR 97 | Ht 66.0 in | Wt 167.2 lb

## 2018-10-04 DIAGNOSIS — E1065 Type 1 diabetes mellitus with hyperglycemia: Secondary | ICD-10-CM

## 2018-10-04 LAB — POCT GLYCOSYLATED HEMOGLOBIN (HGB A1C): Hemoglobin A1C: 7.9 % — AB (ref 4.0–5.6)

## 2018-10-04 NOTE — Progress Notes (Signed)
Name: [name]  Age/ Sex: 33 y.o., female   MRN/ DOB: [id number], [birth year]     PCP: Lanae Boast, FNP   Reason for Endocrinology Evaluation: Type 1 Diabetes Mellitus  Initial Endocrine Consultative Visit: 09/13/2018    PATIENT IDENTIFIER: [name] is a 33 y.o. female with a past medical history of T1DM. The patient has followed with Endocrinology clinic since 09/13/2018 for consultative assistance with management of her diabetes.  DIABETIC HISTORY:  Ms. Roadcap was diagnosed with T1DM at age 35, and has been on insulin since her diagnosis. She was on a pump in 2015 for just a few months but due to cost this was stopped.Over the years she has had intermittent care due to lack of health insurance. Her hemoglobin A1c has ranged from 7.7% in 2018, peaking at 13.2% in 2009.  Last DKA was in 03/2018  SUBJECTIVE:   During the last visit (09/13/2018): Her A1c was 9.5% . She was switched from NPH insulin to basaglar and humalog  through patient assistance program   Today (10/05/2018): [name] is here for a 3 week follow up on diabetes management. She checks her blood sugars 3 times daily, preprandial . The patient has  had hypoglycemic episodes since the last clinic visit, which typically occur 2 x /week - most often occuring during the day. The patient is symptomatic with these episodes, with symptoms of dizziness. Otherwise, the patient has not required any recent emergency interventions for hypoglycemia and has not had recent hospitalizations secondary to hyper or hypoglycemic episodes.  She was recently diagnosed with the influenza virus, she is on her way to recovery, the patient does attribute some of her hyperglycemic glucose readings to taking OTC medications.  ROS: As per HPI and as detailed below: Review of Systems  Constitutional: Negative for fever.  HENT: Negative for congestion and  sore throat.   Respiratory: Positive for cough. Negative for shortness of breath.   Cardiovascular: Negative for chest pain and palpitations.      HOME DIABETES REGIMEN:  Basaglar 16 units daily  Humalog 5 units TIDQAC  GLUCOSE LOG:   Date  breakfast  lunch  supper  10/04/2018  145  149   3/ 8  145  69   3 /7  213  76  154  3/6  91  145/52  402  3/5  185  177  335     HISTORY:  Past Medical History:  Past Medical History:  Diagnosis Date  . Diabetes mellitus without complication [facility])     Past Surgical History: No past surgical history on file.  Social History:  reports that she is a non-smoker but has been exposed to tobacco smoke. She has never used smokeless tobacco. She reports current alcohol use. She reports that she does not use drugs. Family History:  Family History  Problem Relation Age of Onset  . Throat cancer Mother   . Diabetes Maternal Aunt      HOME MEDICATIONS: Allergies as of 10/04/2018   No Known Allergies     Medication List       Accurate as of October 04, 2018 11:59 PM. Always use your most recent med list.        Basaglar KwikPen 100 UNIT/ML Sopn Inject 16 Units into the skin daily. INJECT 16 UNITS UNDER THE SKIN ONCE DAILY.   blood glucose meter kit and supplies Kit Dispense based on patient and insurance preference. Use up to four times daily  as directed. (FOR ICD-9 250.00, 250.01).   CULTURELLE PROBIOTICS PO Take by mouth.   ferrous sulfate 325 (65 FE) MG EC tablet Take 325 mg by mouth daily with breakfast.   glucose blood test strip Commonly known as:  True Metrix Blood Glucose Test Use as instructed   insulin lispro 100 UNIT/ML injection Commonly known as:  HumaLOG Inject 0.05 mLs (5 Units total) into the skin 3 (three) times daily before meals.   INSULIN SYRINGE .5CC/29G 29G X 1/2" 0.5 ML Misc 1 each by Does not apply route 4 (four) times daily - after meals and at bedtime.   Lancet Device Misc 1 each by Does not apply  route 4 (four) times daily - after meals and at bedtime.   True Metrix Air Glucose Meter Joy 1 each by Does not apply route 4 (four) times daily -  with meals and at bedtime.   vitamin C 100 MG tablet Take 100 mg by mouth daily.        OBJECTIVE:   Vital Signs: BP 118/78 (BP Location: Left Arm, Patient Position: Sitting, Cuff Size: Normal)   Pulse 97   Ht 5' 6"  (1.676 m)   Wt 167 lb 3.2 oz (75.8 kg)   LMP 09/10/2018   SpO2 98%   BMI 26.99 kg/m   Wt Readings from Last 3 Encounters:  10/04/18 167 lb 3.2 oz (75.8 kg)  09/13/18 165 lb 3.2 oz (74.9 kg)  06/21/18 159 lb (72.1 kg)     Exam: General: Pt appears well and is in NAD  Lungs: Clear with good BS bilat with no rales, rhonchi, or wheezes  Heart: RRR with normal S1 and S2 and no gallops; no murmurs; no rub  Abdomen: Normoactive bowel sounds, soft, nontender, without masses or organomegaly palpable  Extremities: No pretibial edema. No tremor.  Skin: Normal texture and temperature to palpation. No rash noted. No Acanthosis nigricans/skin tags.  Neuro: MS is good with appropriate affect, pt is alert and Ox3     DATA REVIEWED:  Lab Results  Component Value Date   HGBA1C 7.9 (A) 10/04/2018   HGBA1C 9.5 (A) 06/21/2018   HGBA1C 9.6 (H) 04/16/2018   Lab Results  Component Value Date   MICROALBUR 0.5 03/11/2016   LDLCALC  12/23/2009    94        Total Cholesterol/HDL:CHD Risk Coronary Heart Disease Risk Table                     Men   Women  1/2 Average Risk   3.4   3.3  Average Risk       5.0   4.4  2 X Average Risk   9.6   7.1  3 X Average Risk  23.4   11.0        Use the calculated Patient Ratio above and the CHD Risk Table to determine the patient's CHD Risk.        ATP III CLASSIFICATION (LDL):  <100     mg/dL   Optimal  100-129  mg/dL   Near or Above                    Optimal  130-159  mg/dL   Borderline  160-189  mg/dL   High  >190     mg/dL   Very High   CREATININE 0.75 06/21/2018   No  results found for: MICRALBCREAT  No results found for: FRUCTOSAMINE   Lab Results  Component Value Date   CHOL  12/23/2009    195        ATP III CLASSIFICATION:  <200     mg/dL   Desirable  200-239  mg/dL   Borderline High  >=240    mg/dL   High          HDL 79 12/23/2009   LDLCALC  12/23/2009    94        Total Cholesterol/HDL:CHD Risk Coronary Heart Disease Risk Table                     Men   Women  1/2 Average Risk   3.4   3.3  Average Risk       5.0   4.4  2 X Average Risk   9.6   7.1  3 X Average Risk  23.4   11.0        Use the calculated Patient Ratio above and the CHD Risk Table to determine the patient's CHD Risk.        ATP III CLASSIFICATION (LDL):  <100     mg/dL   Optimal  100-129  mg/dL   Near or Above                    Optimal  130-159  mg/dL   Borderline  160-189  mg/dL   High  >190     mg/dL   Very High   TRIG 108 12/23/2009   CHOLHDL 2.5 12/23/2009         ASSESSMENT / PLAN / RECOMMENDATIONS:   1) Type 1 Diabetes Mellitus, Poorly controlled, With out complications - Most recent A1c of 7.9 %. Goal A1c < 7.0 %.  Down from 9.5%  Plan:  -I have praised the patient on lifestyle changes, compliance with glucose checks, and medication adherence. -She was noted to have hypoglycemic episodes during the day, this is explained by the fact that she eats a carb meal that does not have any carbohydrates but continues to take Humalog -I have explained to her what carbohydrates are, we discussed examples of this, and she was advised that in the future she is going to eat a meal that does not have any carbohydrates, such as scrambled eggs only, then she should avoid taking prandial insulin. -We will not be making any changes at this time -Patient would like to have assistance with glucose strips, she was advised to establish care with the [facility]. -She is currently on patient assistance program to obtain her basal glargine and  Humalog. MEDICATIONS:  Continue Basaglar 16 units daily  Continue Humalog 5 units q. AC 3 times daily  EDUCATION / INSTRUCTIONS:  BG monitoring instructions: Patient is instructed to check her blood sugars 4 times a day, before meals and bedtime.  Call [place] [facility] clinic if: BG persistently < 70 or > 300. . I reviewed the Rule of 15 for the treatment of hypoglycemia in detail with the patient. Literature supplied.    F/U in 3 months   Signed electronically by: Mack Guise, MD  [facility] Endocrinology  [place] [facility] [address] [place], [place] [address] Phone: [phone] FAX: [phone]   CC: Lanae Boast, [place] [address] Phone: [phone]  Fax: [phone]  Return to Endocrinology clinic as below: Future Appointments  Date Time Provider [place]  11/16/2018 10:30 AM Gildardo Pounds, NP  [facility] None  01/04/2019  3:00 PM Emmalia Heyboer, Melanie Crazier, MD [facility] None

## 2018-10-04 NOTE — Patient Instructions (Signed)
-   Continue Basaglar 16 units once a day  - Continue Humalog at 5 units with each meal  - Check sugars before you eat and bedtime  - Please stop by the [facility] clinic to establish cear   -HOW TO TREAT LOW BLOOD SUGARS (Blood sugar LESS THAN 70 MG/DL)  Please follow the RULE OF 15 for the treatment of hypoglycemia treatment (when your (blood sugars are less than 70 mg/dL)    STEP 1: Take 15 grams of carbohydrates when your blood sugar is low, which includes:   3-4 GLUCOSE TABS  OR  3-4 OZ OF JUICE OR REGULAR SODA OR  ONE TUBE OF GLUCOSE GEL     STEP 2: RECHECK blood sugar in 15 MINUTES STEP 3: If your blood sugar is still low at the 15 minute recheck --> then, go back to STEP 1 and treat AGAIN with another 15 grams of carbohydrates.

## 2018-10-27 ENCOUNTER — Telehealth: Payer: Self-pay | Admitting: Internal Medicine

## 2018-10-27 NOTE — Telephone Encounter (Signed)
Patient stated that she believe she is suppose to have another shipment of insulin sent to our office and would like to know if we have received anything

## 2018-10-27 NOTE — Telephone Encounter (Signed)
Spoke to pt and verified that basaglar and humalog both were sent to [name] and that we have not received a shipment for her as of yet, suggested that she call and see if they have shipped anything recently

## 2018-11-16 ENCOUNTER — Ambulatory Visit: Payer: Self-pay | Attending: Nurse Practitioner | Admitting: Nurse Practitioner

## 2018-11-16 ENCOUNTER — Other Ambulatory Visit: Payer: Self-pay

## 2018-11-16 ENCOUNTER — Encounter: Payer: Self-pay | Admitting: Nurse Practitioner

## 2018-11-16 DIAGNOSIS — E1065 Type 1 diabetes mellitus with hyperglycemia: Secondary | ICD-10-CM

## 2018-11-16 DIAGNOSIS — IMO0001 Reserved for inherently not codable concepts without codable children: Secondary | ICD-10-CM

## 2018-11-16 MED ORDER — GLUCOSE BLOOD VI STRP: ORAL_STRIP | 12 refills | Status: DC

## 2018-11-16 MED ORDER — "INSULIN SYRINGE 29G X 1/2"" 0.5 ML MISC": 1.0000 | Freq: Three times a day (TID) | 5 refills | Status: DC

## 2018-11-16 MED ORDER — TRUE METRIX METER W/DEVICE KIT: PACK | 0 refills | Status: DC

## 2018-11-16 MED ORDER — BASAGLAR KWIKPEN 100 UNIT/ML ~~LOC~~ SOPN: 16.0000 [IU] | PEN_INJECTOR | Freq: Every day | SUBCUTANEOUS | 99 refills | Status: DC

## 2018-11-16 MED ORDER — INSULIN LISPRO 100 UNIT/ML ~~LOC~~ SOLN: 5.0000 [IU] | Freq: Three times a day (TID) | SUBCUTANEOUS | 11 refills | Status: DC

## 2018-11-16 MED ORDER — ATORVASTATIN CALCIUM 10 MG PO TABS: 10.0000 mg | ORAL_TABLET | Freq: Every day | ORAL | 3 refills | Status: DC

## 2018-11-16 MED ORDER — LANCET DEVICE MISC: 1.0000 | Freq: Three times a day (TID) | 12 refills | Status: DC

## 2018-11-16 NOTE — Progress Notes (Signed)
Virtual Visit via Telephone Note I connected with [name] on 11/16/18  at  10:30 AM EDT  EDT by telephone and verified that I am speaking with the correct person using two identifiers.   Consent I discussed the limitations, risks, security and privacy concerns of performing an evaluation and management service by telephone and the availability of in person appointments. I also discussed with the patient that there may be a patient responsible charge related to this service. The patient expressed understanding and agreed to proceed.   Location of Patient: Private residence    Location of Provider: [place] and [place] participating in Telemedicine visit: Geryl Rankins FNP-BC [place]    History of Present Illness: Telemedicine appointment to establish care.  She has a history of poorly controlled type 1 diabetes mellitus without complications, colitis, and allergic rhinitis.  Onset preteen years.  Currently being followed by endocrinology for her diabetes.  Recent A1c 7.9% taking Basaglar 16 units daily and Humalog 5 units with meals TID.  States she used to have an insulin pump however lost her insurance and could not afford the insulin or the pump.  She has a good working knowledge of diabetes management as well as nutrition.  Denies any hypoglycemic symptoms.   Observations/Objective: Awake alert and oriented x 3.    Assessment and Plan:   Diagnoses and all orders for this visit:  Uncontrolled type 1 diabetes mellitus without complication (HCC) -     glucose blood (TRUE METRIX BLOOD GLUCOSE TEST) test strip; Use as instructed -     Lancet Device MISC; 1 each by Does not apply route 4 (four) times daily - after meals and at bedtime. -     Insulin Glargine (BASAGLAR KWIKPEN) 100 UNIT/ML SOPN; Inject 0.16 mLs (16 Units total) into the skin daily for 30 days. INJECT 16 UNITS UNDER THE SKIN ONCE DAILY. -     insulin lispro  (HUMALOG) 100 UNIT/ML injection; Inject 0.05 mLs (5 Units total) into the skin 3 (three) times daily before meals. -     INSULIN SYRINGE .5CC/29G 29G X 1/2" 0.5 ML MISC; 1 each by Does not apply route 4 (four) times daily - after meals and at bedtime. -     Blood Glucose Monitoring Suppl (TRUE METRIX METER) w/Device KIT; Use as instructed. Monitor blood glucose levels 4 times per day Diabetes is poorly controlled. Advised patient to keep a fasting blood sugar log fast, 2 hours post lunch and bedtime which will be reviewed at the next office visit.    Follow Up Instructions Return in about 3 months (around 02/15/2019).     I discussed the assessment and treatment plan with the patient. The patient was provided an opportunity to ask questions and all were answered. The patient agreed with the plan and demonstrated an understanding of the instructions.   The patient was advised to call back or seek an in-person evaluation if the symptoms worsen or if the condition fails to improve as anticipated.  I provided 13 minutes of non-face-to-face time during this encounter including median intraservice time, reviewing previous notes, labs, imaging, medications and explaining diagnosis and management.  Gildardo Pounds, FNP-BC

## 2019-01-04 ENCOUNTER — Ambulatory Visit: Payer: Self-pay | Admitting: Internal Medicine

## 2019-01-11 ENCOUNTER — Other Ambulatory Visit: Payer: Self-pay

## 2019-01-11 DIAGNOSIS — IMO0001 Reserved for inherently not codable concepts without codable children: Secondary | ICD-10-CM

## 2019-01-11 MED ORDER — BASAGLAR KWIKPEN 100 UNIT/ML ~~LOC~~ SOPN: 16.0000 [IU] | PEN_INJECTOR | Freq: Every day | SUBCUTANEOUS | 99 refills | Status: DC

## 2019-01-25 ENCOUNTER — Telehealth: Payer: Self-pay | Admitting: Internal Medicine

## 2019-01-25 NOTE — Telephone Encounter (Signed)
Calling about a fast acting insulin that was supposed to be called in to be taken in addition to the [employer].  Pt would like a call back

## 2019-01-25 NOTE — Telephone Encounter (Signed)
Spoke to pt and explained to her that her humalog is her fast acting insulin and the basaglar is her long acting also that she has refills for her humalog at the pharmacy.

## 2019-02-15 ENCOUNTER — Ambulatory Visit: Payer: Self-pay | Attending: Nurse Practitioner | Admitting: Nurse Practitioner

## 2019-02-15 ENCOUNTER — Encounter: Payer: Self-pay | Admitting: Nurse Practitioner

## 2019-02-15 ENCOUNTER — Other Ambulatory Visit: Payer: Self-pay

## 2019-02-17 MED FILL — !TRUE METRIX BLOOD GLUCOSE: 365 days supply | Qty: 1 | Fill #0

## 2019-02-17 MED FILL — ?HUMALOG 100 UNITS/ML VIAL: 100 | 28 days supply | Qty: 10 | Fill #0

## 2019-02-17 MED FILL — TRUE METRIX TEST STRIP: 25 days supply | Qty: 100 | Fill #0

## 2019-02-18 ENCOUNTER — Ambulatory Visit (HOSPITAL_COMMUNITY)
Admission: EM | Admit: 2019-02-18 | Discharge: 2019-02-18 | Disposition: A | Discharge status: Home / Self Care | Payer: Self-pay | Attending: Family Medicine | Admitting: Family Medicine

## 2019-02-18 ENCOUNTER — Other Ambulatory Visit: Payer: Self-pay

## 2019-02-18 ENCOUNTER — Encounter (HOSPITAL_COMMUNITY): Payer: Self-pay | Admitting: Emergency Medicine

## 2019-02-18 DIAGNOSIS — L089 Local infection of the skin and subcutaneous tissue, unspecified: Secondary | ICD-10-CM

## 2019-02-18 MED ORDER — CEPHALEXIN 500 MG PO CAPS: 500.0000 mg | ORAL_CAPSULE | Freq: Four times a day (QID) | ORAL | 0 refills | Status: DC

## 2019-02-18 MED ORDER — FLUCONAZOLE 150 MG PO TABS: 150.0000 mg | ORAL_TABLET | Freq: Every day | ORAL | 0 refills | Status: DC

## 2019-02-18 MED FILL — FLUCONAZOLE 150 MG TABS: 150 | 3 days supply | Qty: 3 | Fill #0

## 2019-02-18 MED FILL — CEPHALEXIN 500 MG CAPSULE: 500 | 7 days supply | Qty: 28 | Fill #0

## 2019-02-18 NOTE — Discharge Instructions (Signed)
Take the medication as prescribed Warm soaks.  Follow up as needed for continued or worsening symptoms

## 2019-02-18 NOTE — ED Provider Notes (Signed)
[place]    CSN: [id number] Arrival date & time: 02/18/19  1244     History   Chief Complaint Chief Complaint  Patient presents with  . Toe Pain    HPI JANNELL FRANTA is a 33 y.o. female.   Patient is a 33 year old female past medical history of diabetes, DKA, prolonged QT, dizziness, anxiety, depression.  She presents today with approximate 1 week of worsening left great toe pain.  The pain is around the nailbed with swelling and drainage.  Reports started after she believes she cut her toenail too short.  She has been putting antibiotic ointment on it and keeping it clean but it continues to worsen.  She is concerned due to her diabetes.  Denies any associated fevers, chills, body aches or dizziness.   ROS per HPI      Past Medical History:  Diagnosis Date  . Diabetes mellitus without complication [facility])     Patient Active Problem List   Diagnosis Date Noted  . Diabetic ketoacidosis ([place]) 04/15/2018  . Refusal of blood transfusion for reasons of conscience 12/26/2017  . DKA, type 1 ([place]) 05/19/2017  . Colitis   . Prolonged QT interval   . Hyperpigmentation of skin, postinflammatory 08/07/2016  . Dizziness 04/25/2016  . DKA (diabetic ketoacidoses) ([place]) 04/25/2016  . Rhinitis, allergic 10/11/2015  . Diabetes type 1, uncontrolled ([place]) 07/25/2015  . Cephalalgia 07/25/2015  . Generalized anxiety disorder 07/25/2015  . Depression 07/25/2015  . Diabetic ketoacidosis without coma associated with type 1 diabetes mellitus ([place]) 07/11/2012  . Leukocytosis 07/11/2012  . Hyperkalemia 07/11/2012  . High anion gap metabolic acidosis 86/57/8469    History reviewed. No pertinent surgical history.  OB History   No obstetric history on file.      Home Medications    Prior to Admission medications   Medication Sig Start Date End Date Taking? Authorizing Provider  Ascorbic Acid (VITAMIN C) 100 MG tablet Take 100 mg by mouth daily.    [provider]  atorvastatin (LIPITOR) 10 MG tablet Take 1 tablet (10 mg total) by mouth daily. Patient not taking: Reported on 02/18/2019 11/16/18   Gildardo Pounds, NP  blood glucose meter kit and supplies KIT Dispense based on patient and insurance preference. Use up to four times daily as directed. (FOR ICD-9 250.00, 250.01). Patient not taking: Reported on 11/16/2018 04/18/18   Kayleen Memos, DO  Blood Glucose Monitoring Suppl (TRUE METRIX AIR GLUCOSE METER) DEVI 1 each by Does not apply route 4 (four) times daily -  with meals and at bedtime. 07/25/15   [name], FNP  Blood Glucose Monitoring Suppl (TRUE METRIX METER) w/Device KIT Use as instructed. Monitor blood glucose levels 4 times per day 11/16/18   Gildardo Pounds, NP  cephALEXin (KEFLEX) 500 MG capsule Take 1 capsule (500 mg total) by mouth 4 (four) times daily. 02/18/19   Loura Halt A, NP  ferrous sulfate 325 (65 FE) MG EC tablet Take 325 mg by mouth daily with breakfast.    [provider]  fluconazole (DIFLUCAN) 150 MG tablet Take 1 tablet (150 mg total) by mouth daily. 02/18/19   Loura Halt A, NP  glucose blood (TRUE METRIX BLOOD GLUCOSE TEST) test strip Use as instructed 11/16/18   Gildardo Pounds, NP  Insulin Glargine (BASAGLAR KWIKPEN) 100 UNIT/ML SOPN Inject 0.16 mLs (16 Units total) into the skin daily for 30 days. INJECT 16 UNITS UNDER THE SKIN ONCE DAILY. 01/11/19 02/10/19  Shamleffer, Melanie Crazier, MD  insulin lispro (HUMALOG) 100 UNIT/ML injection Inject 0.05 mLs (5 Units total) into the skin 3 (three) times daily before meals. 11/16/18   Gildardo Pounds, NP  INSULIN SYRINGE .5CC/29G 29G X 1/2" 0.5 ML MISC 1 each by Does not apply route 4 (four) times daily - after meals and at bedtime. 11/16/18   Gildardo Pounds, NP  Lancet Device MISC 1 each by Does not apply route 4 (four) times daily - after meals and at bedtime. 11/16/18   Gildardo Pounds, NP  Probiotic Product (CULTURELLE PROBIOTICS PO) Take by mouth.     [provider]    Family History Family History  Problem Relation Age of Onset  . Throat cancer Mother   . Diabetes Maternal Aunt     Social History Social History   Tobacco Use  . Smoking status: Passive Smoke Exposure - Never Smoker  . Smokeless tobacco: Never Used  Substance Use Topics  . Alcohol use: Yes    Comment: occasionally   . Drug use: No     Allergies   Patient has no known allergies.   Review of Systems Review of Systems   Physical Exam Triage Vital Signs ED Triage Vitals  Enc Vitals Group     BP 02/18/19 1335 112/80     Pulse Rate 02/18/19 1335 96     Resp 02/18/19 1335 16     Temp 02/18/19 1335 98.7 F (37.1 C)     Temp src --      SpO2 02/18/19 1335 100 %     Weight --      Height --      Head Circumference --      Peak Flow --      Pain Score 02/18/19 1337 6     Pain Loc --      Pain Edu? --      Excl. in [place]? --    No data found.  Updated Vital Signs BP 112/80   Pulse 96   Temp 98.7 F (37.1 C)   Resp 16   LMP 02/18/2019   SpO2 100%   Visual Acuity Right Eye Distance:   Left Eye Distance:   Bilateral Distance:    Right Eye Near:   Left Eye Near:    Bilateral Near:     Physical Exam Vitals signs and nursing note reviewed.  Constitutional:      Appearance: Normal appearance.  HENT:     Head: Normocephalic and atraumatic.     Nose: Nose normal.  Eyes:     Conjunctiva/sclera: Conjunctivae normal.  Neck:     Musculoskeletal: Normal range of motion.  Pulmonary:     Effort: Pulmonary effort is normal.  Musculoskeletal: Normal range of motion.  Skin:    General: Skin is warm and dry.     Comments: Swelling to the left great toe near nail bed on the lateral side with erythema.  Green rainage coming from the cuticle.   Neurological:     Mental Status: She is alert.  Psychiatric:        Mood and Affect: Mood normal.      UC Treatments / Results  Labs (all labs ordered are listed, but only abnormal  results are displayed) Labs Reviewed - No data to display  EKG   Radiology No results found.  Procedures Procedures (including critical care time)  Medications Ordered in UC Medications - No data to display  Initial Impression /  Assessment and Plan / UC Course  I have reviewed the triage vital signs and the nursing notes.  Pertinent labs & imaging results that were available during my care of the patient were reviewed by me and considered in my medical decision making (see chart for details).     Patient is a 33 year old female with past medical history of diabetes type 1.  Coming in for left great toe infection. We will go ahead and treat the infection with Keflex.  Patient requesting Diflucan for yeast infection post antibiotic use. Instructed that if symptoms worsen or do not improve over the next 48 hours you need to follow-up for recheck. Recommended warm soaks  Final Clinical Impressions(s) / UC Diagnoses   Final diagnoses:  Toe infection     Discharge Instructions     Take the medication as prescribed Warm soaks.  Follow up as needed for continued or worsening symptoms     ED Prescriptions    Medication Sig Dispense Auth. Provider   cephALEXin (KEFLEX) 500 MG capsule Take 1 capsule (500 mg total) by mouth 4 (four) times daily. 28 capsule Ida Milbrath A, NP   fluconazole (DIFLUCAN) 150 MG tablet Take 1 tablet (150 mg total) by mouth daily. 3 tablet Loura Halt A, NP     Controlled Substance Prescriptions  Controlled Substance Registry consulted? Not Applicable   Orvan July, NP 02/18/19 1446

## 2019-02-18 NOTE — ED Triage Notes (Signed)
Pt states "I think I cut my toenail too much and I think im developing an infection in my big toe." Swelling and drainage noticed to L big toe.

## 2019-03-08 ENCOUNTER — Ambulatory Visit: Payer: Self-pay | Attending: Nurse Practitioner | Admitting: Nurse Practitioner

## 2019-03-08 ENCOUNTER — Encounter: Payer: Self-pay | Admitting: Nurse Practitioner

## 2019-03-08 ENCOUNTER — Other Ambulatory Visit: Payer: Self-pay

## 2019-03-08 VITALS — BP 124/80 | HR 97 | Temp 98.6°F | Ht 66.0 in | Wt 168.8 lb

## 2019-03-08 DIAGNOSIS — F32A Depression, unspecified: Secondary | ICD-10-CM

## 2019-03-08 DIAGNOSIS — Z01419 Encounter for gynecological examination (general) (routine) without abnormal findings: Secondary | ICD-10-CM

## 2019-03-08 DIAGNOSIS — IMO0001 Reserved for inherently not codable concepts without codable children: Secondary | ICD-10-CM

## 2019-03-08 DIAGNOSIS — E1065 Type 1 diabetes mellitus with hyperglycemia: Secondary | ICD-10-CM

## 2019-03-08 DIAGNOSIS — K5900 Constipation, unspecified: Secondary | ICD-10-CM

## 2019-03-08 DIAGNOSIS — F329 Major depressive disorder, single episode, unspecified: Secondary | ICD-10-CM

## 2019-03-08 DIAGNOSIS — F419 Anxiety disorder, unspecified: Secondary | ICD-10-CM

## 2019-03-08 LAB — GLUCOSE, POCT (MANUAL RESULT ENTRY): POC Glucose: 75 mg/dl (ref 70–99)

## 2019-03-08 MED ORDER — ESCITALOPRAM OXALATE 10 MG PO TABS: 10.0000 mg | ORAL_TABLET | Freq: Every day | ORAL | 1 refills | Status: DC

## 2019-03-08 MED ORDER — ATORVASTATIN CALCIUM 10 MG PO TABS: 10.0000 mg | ORAL_TABLET | Freq: Every day | ORAL | 3 refills | Status: DC

## 2019-03-08 MED ORDER — HYDROXYZINE HCL 25 MG PO TABS: 25.0000 mg | ORAL_TABLET | Freq: Three times a day (TID) | ORAL | 1 refills | Status: DC | PRN

## 2019-03-08 MED ORDER — POLYETHYLENE GLYCOL 3350 17 GM/SCOOP PO POWD: 17.0000 g | Freq: Two times a day (BID) | ORAL | 1 refills | Status: DC | PRN

## 2019-03-08 MED FILL — POLYETHYLENE GLYCOL 3350 PO: 17 | 7 days supply | Qty: 238 | Fill #0

## 2019-03-08 MED FILL — ESCITALOPRAM 10 MG TABLET: 10 | 30 days supply | Qty: 30 | Fill #0

## 2019-03-08 MED FILL — ATORVASTATIN 10 MG TABLET: 10 | 30 days supply | Qty: 30 | Fill #0

## 2019-03-08 MED FILL — hydrOXYzine HCL 25 MG TABS: 25 | 20 days supply | Qty: 60 | Fill #0

## 2019-03-08 NOTE — Progress Notes (Signed)
Assessment & Plan:  [name] was seen today for annual exam.  Diagnoses and all orders for this visit:  Well woman exam with routine gynecological exam -     Cervicovaginal ancillary only -     Cytology - PAP -     Basic metabolic panel -     Lipid panel -     CBC -     HIV antibody (with reflex)  Uncontrolled type 1 diabetes mellitus without complication (HCC) -     Glucose (CBG) -     Ambulatory referral to Ophthalmology -     atorvastatin (LIPITOR) 10 MG tablet; Take 1 tablet (10 mg total) by mouth daily.  Patient has been advised to apply for financial assistance and schedule to see our financial counselor.  Increase basaglar by 2 units every 3 days until fasting BG 100-120.  Anxiety and depression -     escitalopram (LEXAPRO) 10 MG tablet; Take 1 tablet (10 mg total) by mouth daily. -     hydrOXYzine (ATARAX/VISTARIL) 25 MG tablet; Take 1 tablet (25 mg total) by mouth 3 (three) times daily as needed for anxiety.  Constipation, unspecified constipation type -     polyethylene glycol powder (GLYCOLAX/MIRALAX) 17 GM/SCOOP powder; Take 17 g by mouth 2 (two) times daily as needed. Drink at least 64 oz of water per day.   Patient has been counseled on age-appropriate routine health concerns for screening and prevention. These are reviewed and up-to-date. Referrals have been placed accordingly. Immunizations are up-to-date or declined.    Subjective:   Chief Complaint  Patient presents with  . Annual Exam    Pt. is here for a physical and pap.    HPI [name] 33 y.o. female presents to office today for Well woman exam.   Anxiety and Depression She endorses increased mood lability with symptoms of irritability, sadness, tearfulness, panic attacks. An SSRI was recommended to her years ago however she declined at that time. She is amenable to one being initiated today. Denies any suicidal ideation. Depression screen [facility] 2/9 03/08/2019 06/21/2018 08/07/2016 03/11/2016  10/11/2015  Decreased Interest 2 0 1 0 0  Down, Depressed, Hopeless 2 0 0 0 0  PHQ - 2 Score 4 0 1 0 0  Altered sleeping 2 - - - -  Tired, decreased energy 3 - - - -  Change in appetite 3 - - - -  Feeling bad or failure about yourself  2 - - - -  Trouble concentrating 2 - - - -  Moving slowly or fidgety/restless 1 - - - -  Suicidal thoughts 1 - - - -  PHQ-9 Score 18 - - - -  Difficult doing work/chores - - - - -   GAD 7 : Generalized Anxiety Score 03/08/2019 07/25/2015  Nervous, Anxious, on Edge 3 2  Control/stop worrying 3 1  Worry too much - different things 3 1  Trouble relaxing 3 1  Restless 1 1  Easily annoyed or irritable 3 1  Afraid - awful might happen 3 3  Total GAD 7 Score 19 10  Anxiety Difficulty - Not difficult at all     Review of Systems  Constitutional: Negative.  Negative for chills, fever, malaise/fatigue and weight loss.  HENT: Negative.  Negative for congestion, hearing loss, sinus pain and sore throat.   Eyes: Negative.  Negative for blurred vision, double vision, photophobia and pain.  Respiratory: Negative.  Negative for cough, sputum production, shortness of  breath and wheezing.   Cardiovascular: Negative.  Negative for chest pain and leg swelling.  Gastrointestinal: Positive for constipation. Negative for abdominal pain, diarrhea, heartburn, nausea and vomiting.  Genitourinary: Negative.   Musculoskeletal: Negative.  Negative for joint pain and myalgias.  Skin: Negative.  Negative for rash.  Neurological: Negative.  Negative for dizziness, tremors, speech change, focal weakness, seizures and headaches.  Endo/Heme/Allergies: Negative.  Negative for environmental allergies.  Psychiatric/Behavioral: Positive for depression. Negative for suicidal ideas. The patient is nervous/anxious. The patient does not have insomnia.     Past Medical History:  Diagnosis Date  . Diabetes mellitus without complication ([place])     History reviewed. No pertinent surgical  history.  Family History  Problem Relation Age of Onset  . Throat cancer Mother   . Diabetes Maternal Aunt     Social History Reviewed with no changes to be made today.   Outpatient Medications Prior to Visit  Medication Sig Dispense Refill  . Blood Glucose Monitoring Suppl (TRUE METRIX AIR GLUCOSE METER) DEVI 1 each by Does not apply route 4 (four) times daily -  with meals and at bedtime. 1 Device 0  . glucose blood (TRUE METRIX BLOOD GLUCOSE TEST) test strip Use as instructed 100 each 12  . insulin lispro (HUMALOG) 100 UNIT/ML injection Inject 0.05 mLs (5 Units total) into the skin 3 (three) times daily before meals. 10 mL 11  . INSULIN SYRINGE .5CC/29G 29G X 1/2" 0.5 ML MISC 1 each by Does not apply route 4 (four) times daily - after meals and at bedtime. 200 each 5  . Lancet Device MISC 1 each by Does not apply route 4 (four) times daily - after meals and at bedtime. 1 each 12  . Probiotic Product (CULTURELLE PROBIOTICS PO) Take by mouth.    . cephALEXin (KEFLEX) 500 MG capsule Take 1 capsule (500 mg total) by mouth 4 (four) times daily. 28 capsule 0  . Ascorbic Acid (VITAMIN C) 100 MG tablet Take 100 mg by mouth daily.    . blood glucose meter kit and supplies KIT Dispense based on patient and insurance preference. Use up to four times daily as directed. (FOR ICD-9 250.00, 250.01). (Patient not taking: Reported on 11/16/2018) 1 each 0  . Blood Glucose Monitoring Suppl (TRUE METRIX METER) w/Device KIT Use as instructed. Monitor blood glucose levels 4 times per day (Patient not taking: Reported on 03/08/2019) 1 kit 0  . ferrous sulfate 325 (65 FE) MG EC tablet Take 325 mg by mouth daily with breakfast.    . Insulin Glargine (BASAGLAR KWIKPEN) 100 UNIT/ML SOPN Inject 0.16 mLs (16 Units total) into the skin daily for 30 days. INJECT 16 UNITS UNDER THE SKIN ONCE DAILY. 15 mL prn  . atorvastatin (LIPITOR) 10 MG tablet Take 1 tablet (10 mg total) by mouth daily. (Patient not taking: Reported on  02/18/2019) 90 tablet 3  . fluconazole (DIFLUCAN) 150 MG tablet Take 1 tablet (150 mg total) by mouth daily. 3 tablet 0   No facility-administered medications prior to visit.     No Known Allergies     Objective:    BP 124/80 (BP Location: Left Arm, Patient Position: Sitting, Cuff Size: Normal)   Pulse 97   Temp 98.6 F (37 C) (Oral)   Ht 5' 6"  (1.676 m)   Wt 168 lb 12.8 oz (76.6 kg)   LMP 02/18/2019   SpO2 100%   BMI 27.25 kg/m  Wt Readings from Last 3 Encounters:  03/08/19  168 lb 12.8 oz (76.6 kg)  10/04/18 167 lb 3.2 oz (75.8 kg)  09/13/18 165 lb 3.2 oz (74.9 kg)    Physical Exam Exam conducted with a chaperone present.  Constitutional:      General: She is not in acute distress.    Appearance: She is well-developed. She is not diaphoretic.  HENT:     Head: Normocephalic and atraumatic.     Right Ear: Hearing, tympanic membrane, ear canal and external ear normal.     Left Ear: Hearing, tympanic membrane, ear canal and external ear normal.     Nose: Nose normal.     Mouth/Throat:     Pharynx: No oropharyngeal exudate.  Eyes:     General: Lids are normal. Gaze aligned appropriately. No scleral icterus.       Right eye: No discharge.        Left eye: No discharge.     Extraocular Movements: Extraocular movements intact.     Conjunctiva/sclera: Conjunctivae normal.     Pupils: Pupils are equal, round, and reactive to light.  Neck:     Musculoskeletal: Normal range of motion and neck supple.     Thyroid: No thyromegaly.     Trachea: No tracheal deviation.  Cardiovascular:     Rate and Rhythm: Normal rate and regular rhythm.     Pulses:          Dorsalis pedis pulses are 2+ on the right side and 2+ on the left side.       Posterior tibial pulses are 2+ on the right side and 2+ on the left side.     Heart sounds: Normal heart sounds. No murmur. No friction rub.  Pulmonary:     Effort: Pulmonary effort is normal. No respiratory distress.     Breath sounds: Normal  breath sounds. No decreased breath sounds, wheezing, rhonchi or rales.  Chest:     Chest wall: No mass or tenderness.     Breasts:        Right: Normal. No swelling, bleeding, inverted nipple, mass, nipple discharge, skin change or tenderness.        Left: Normal. No swelling, bleeding, inverted nipple, mass, nipple discharge, skin change or tenderness.       Comments: Nipple piercings Abdominal:     General: Bowel sounds are normal. There is distension (mild).     Palpations: Abdomen is soft. There is no mass.     Tenderness: There is no abdominal tenderness. There is no guarding or rebound.     Hernia: There is no hernia in the left inguinal area.  Genitourinary:    General: Normal vulva.     Exam position: Lithotomy position.     Pubic Area: No rash.      Labia:        Right: No rash, tenderness, lesion or injury.        Left: No rash, tenderness, lesion or injury.      Vagina: Normal. No vaginal discharge, erythema, tenderness or bleeding.     Cervix: Normal.     Uterus: Normal. Not tender.      Adnexa:        Right: No mass, tenderness or fullness.         Left: No mass, tenderness or fullness.       Rectum: No mass, anal fissure or external hemorrhoid. Normal anal tone.  Musculoskeletal: Normal range of motion.        General: No tenderness or  deformity.  Feet:     Right foot:     Skin integrity: Skin integrity normal.     Left foot:     Skin integrity: Skin integrity normal.  Lymphadenopathy:     Cervical: No cervical adenopathy.  Skin:    General: Skin is warm and dry.     Coloration: Skin is not pale.     Findings: No erythema or rash.  Neurological:     Mental Status: She is alert and oriented to person, place, and time.     Cranial Nerves: No cranial nerve deficit.     Sensory: Sensation is intact.     Motor: Motor function is intact.     Coordination: Coordination is intact. Coordination normal.     Gait: Gait is intact.     Deep Tendon Reflexes:      Reflex Scores:      Patellar reflexes are 1+ on the right side and 1+ on the left side. Psychiatric:        Attention and Perception: Attention and perception normal.        Mood and Affect: Mood and affect normal.        Speech: Speech normal.        Behavior: Behavior normal.        Thought Content: Thought content normal.        Cognition and Memory: Cognition and memory normal.        Judgment: Judgment normal.          Patient has been counseled extensively about nutrition and exercise as well as the importance of adherence with medications and regular follow-up. The patient was given clear instructions to go to ER or return to medical center if symptoms don't improve, worsen or new problems develop. The patient verbalized understanding.   Follow-up: Return in about 4 weeks (around 04/05/2019) for hyperglycemia, depression, constipation.   Gildardo Pounds, FNP-BC [facility] [place], [place]   03/08/2019, 9:22 PM

## 2019-03-08 NOTE — Patient Instructions (Signed)
Behavioral Health Resources:  ? ?What if I or someone I know is in crisis? ? ?If you are thinking about harming yourself or having thoughts of suicide, or if you know someone who is, seek help right away. ? ?Call your doctor or mental health care provider. ? ?Call 911 or go to a hospital emergency room to get immediate help, or ask a friend or family member to help you do these things. ? ?Call the [place] National Suicide Prevention Lifeline?s toll-free, 24-hour hotline at 1-800-273-TALK (1-800-273-8255) or TTY: 1-800-799-4 TTY (1-800-799-4889) to talk to a trained counselor. ? ?If you are in crisis, make sure you are not left alone.  ? ?If someone else is in crisis, make sure he or she is not left alone ? ? ?24 Hour Availability ? ?[facility]  ?[address], [place], [place] [address]  ?336-832-9700 or 1-800-711-2635 ? ?[facility] Crisis Line ?(Domestic Violence, Rape & Victim Assistance ?336-273-7273 ? ?[facility]  ?[address] [place], [place]  [address]               1-855-788-8787 or 336-676-6840 ? ?[facility] [place] [facility]    ?(ONLY from 8am-4pm)    ?336-899-1505 ? ?[facility] (24/7)   ?1-877-626-1772 ? ?[place] National Suicide Hotline   ?1-800-273-8255 (TALK) ? ?Support from local police to aid getting patient to hospital (http://www.[place].gov/index.aspx?page=2797) ? ? ?      ?ONGOING BEHAVIORAL HEALTH SUPPORT FOR UNINSURED and UNDERINSURED:  ?[facility]  ?336-676-6840  ?[address]  ?Walk-in first time, Monday-Friday, 8:30am-5:00pm  ?*Bring snack, drink, something to do, long wait at first visit, they do have pharmacy for behavioral health medications/ Bring own interpreter at first visit, if needed ?[facility]  ?336-387-6161  ?[address]  ?Walk-in Monday-Friday, 8:30am-12pm & 1-2:30pm  ?*pacientes que hablen espanol, favor comunicarse con el Sr.  [place], extension 2244 o la [address], extension 3331 para hacer una cita  ?[facility]:  ?336-429-5600 or kellinfoundation@gmail.com  ?[address], [address]  ?Call or email, may self-refer  ?* uninsured/underinsured, 19-64yo, have both mental health and substance use challenges  ?[facility]:  ?Phone ([phone]; Fax [phone]  ?*Call to schedule an appointment  ?3rd Floor located @?[address]  ?Mon-Thursday: 8:30am-8:00pm Friday: 8:30am-7:00pm  ?* Be sure to park in a space labeled ?Psychology Department,? located to the right of the main door of the building. Enter the main doors facing the parking lot and take the elevator or stairs to the 3rd Floor.  ?[facility]:  ?336-832-9700 or  ?1-800-711-2635 (24/hour helpline)  ?[address]  ?Call to make appointment, tends to be a long wait to begin services, depending on insurance  ?[facility]  ?(336) 333-6860 ??  ?*Call to schedule an appointment  ?[address]  ?Monday-Friday, 8:00am-5:00pm  ?[facility]  ?(336) [phone]  ?[address], [place]  ?Monday-Friday, walk-in 8am-3pm  ?First appointment is assessment, then will make appointment for psychiatry   ?  ?

## 2019-03-09 ENCOUNTER — Ambulatory Visit: Payer: Self-pay | Attending: Nurse Practitioner

## 2019-03-10 LAB — CBC
Hematocrit: 40 % (ref 34.0–46.6)
Hemoglobin: 13 g/dL (ref 11.1–15.9)
MCH: 27.4 pg (ref 26.6–33.0)
MCHC: 32.5 g/dL (ref 31.5–35.7)
MCV: 84 fL (ref 79–97)
Platelets: 310 10*3/uL (ref 150–450)
RBC: 4.75 x10E6/uL (ref 3.77–5.28)
RDW: 13.1 % (ref 11.7–15.4)
WBC: 4 10*3/uL (ref 3.4–10.8)

## 2019-03-10 LAB — BASIC METABOLIC PANEL
BUN/Creatinine Ratio: 17 (ref 9–23)
BUN: 14 mg/dL (ref 6–20)
CO2: 21 mmol/L (ref 20–29)
Calcium: 9.5 mg/dL (ref 8.7–10.2)
Chloride: 99 mmol/L (ref 96–106)
Creatinine, Ser: 0.84 mg/dL (ref 0.57–1.00)
GFR calc Af Amer: 106 mL/min/{1.73_m2} (ref >59)
GFR calc non Af Amer: 92 mL/min/{1.73_m2} (ref >59)
Glucose: 218 mg/dL — ABNORMAL HIGH (ref 65–99)
Potassium: 4.5 mmol/L (ref 3.5–5.2)
Sodium: 135 mmol/L (ref 134–144)

## 2019-03-10 LAB — HIV ANTIBODY (ROUTINE TESTING W REFLEX): HIV Screen 4th Generation wRfx: NONREACTIVE

## 2019-03-10 LAB — LIPID PANEL
Chol/HDL Ratio: 2.3 ratio (ref 0.0–4.4)
Cholesterol, Total: 190 mg/dL (ref 100–199)
HDL: 84 mg/dL (ref >39)
LDL Calculated: 97 mg/dL (ref 0–99)
Triglycerides: 47 mg/dL (ref 0–149)
VLDL Cholesterol Cal: 9 mg/dL (ref 5–40)

## 2019-03-11 LAB — CYTOLOGY - PAP: Diagnosis: NEGATIVE

## 2019-03-11 LAB — CERVICOVAGINAL ANCILLARY ONLY
Bacterial vaginitis: NEGATIVE
Candida vaginitis: NEGATIVE
Chlamydia: NEGATIVE
Neisseria Gonorrhea: NEGATIVE
Trichomonas: NEGATIVE

## 2019-03-23 MED FILL — POLYETHYLENE GLYCOL 3350 PO: 17 | 7 days supply | Qty: 238 | Fill #1

## 2019-04-11 ENCOUNTER — Encounter: Payer: Self-pay | Admitting: Nurse Practitioner

## 2019-04-11 ENCOUNTER — Ambulatory Visit: Payer: Self-pay | Attending: Nurse Practitioner | Admitting: Nurse Practitioner

## 2019-04-11 DIAGNOSIS — F419 Anxiety disorder, unspecified: Secondary | ICD-10-CM

## 2019-04-11 DIAGNOSIS — F32A Depression, unspecified: Secondary | ICD-10-CM

## 2019-04-11 DIAGNOSIS — K5900 Constipation, unspecified: Secondary | ICD-10-CM

## 2019-04-11 DIAGNOSIS — E785 Hyperlipidemia, unspecified: Secondary | ICD-10-CM

## 2019-04-11 DIAGNOSIS — F329 Major depressive disorder, single episode, unspecified: Secondary | ICD-10-CM

## 2019-04-11 MED ORDER — ATORVASTATIN CALCIUM 10 MG PO TABS: 10.0000 mg | ORAL_TABLET | Freq: Every day | ORAL | 3 refills | Status: DC

## 2019-04-11 MED ORDER — METOCLOPRAMIDE HCL 5 MG PO TABS: 5.0000 mg | ORAL_TABLET | Freq: Three times a day (TID) | ORAL | 0 refills | Status: DC

## 2019-04-11 MED ORDER — SERTRALINE HCL 50 MG PO TABS: 50.0000 mg | ORAL_TABLET | Freq: Every day | ORAL | 3 refills | Status: DC

## 2019-04-11 MED FILL — METOCLOPRAMIDE 5 MG TABLET: 5 | 30 days supply | Qty: 90 | Fill #0

## 2019-04-11 MED FILL — ATORVASTATIN 10 MG TABLET: 10 | 30 days supply | Qty: 30 | Fill #0

## 2019-04-11 MED FILL — SERTRALINE HCL 50 MG TABS: 50 | 30 days supply | Qty: 30 | Fill #0

## 2019-04-11 NOTE — Progress Notes (Signed)
Virtual Visit via Telephone Note Due to national recommendations of social distancing due to [place] 19, telehealth visit is felt to be most appropriate for this patient at this time.  I discussed the limitations, risks, security and privacy concerns of performing an evaluation and management service by telephone and the availability of in person appointments. I also discussed with the patient that there may be a patient responsible charge related to this service. The patient expressed understanding and agreed to proceed.    I connected with Lenice Llamas on 04/12/19  at  11:10 AM EDT  EDT by telephone and verified that I am speaking with the correct person using two identifiers.   Consent I discussed the limitations, risks, security and privacy concerns of performing an evaluation and management service by telephone and the availability of in person appointments. I also discussed with the patient that there may be a patient responsible charge related to this service. The patient expressed understanding and agreed to proceed.   Location of Patient: Private Residence    Location of Provider: [place] [facility] [employer] Office    Persons participating in Telemedicine visit: Geryl Rankins FNP-BC [place]    History of Present Illness: Telemedicine visit for: Anxiety and Depression   Anxiety and Depression  She was prescribed lexapro several weeks ago for anxiety and depression. She stopped taking after 2 weeks because it caused her body to go to numb whenever she would take it. Will switch to zoloft. She is also taking hydroxyzine 25 mg TID prn for anxiety. She denies any current thoughts of self harm.  BP Readings from Last 3 Encounters:  03/08/19 124/80  02/18/19 112/80  10/04/18 118/78    Constipation: Patient complains of chronic constipation. 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 Symptoms have been  symptoms have progressed to a point and plateaued. Current Health Habits: Eating fiber? Yes but likely not enough.  Exercise?no Water intake? Yes.  Current OTC/RX therapy has been miralax which has been ineffective.   Past Medical History:  Diagnosis Date  . Diabetes mellitus without complication ([place])     History reviewed. No pertinent surgical history.  Family History  Problem Relation Age of Onset  . Throat cancer Mother   . Diabetes Maternal Aunt     Social History   Socioeconomic History  . Marital status: Single    Spouse name: Not on file  . Number of children: Not on file  . Years of education: Not on file  . Highest education level: Not on file  Occupational History  . Not on file  Social Needs  . Financial resource strain: Not on file  . Food insecurity    Worry: Not on file    Inability: Not on file  . Transportation needs    Medical: Not on file    Non-medical: Not on file  Tobacco Use  . Smoking status: Passive Smoke Exposure - Never Smoker  . Smokeless tobacco: Never Used  Substance and Sexual Activity  . Alcohol use: Yes    Comment: occasionally   . Drug use: No  . Sexual activity: Not Currently  Lifestyle  . Physical activity    Days per week: Not on file    Minutes per session: Not on file  . Stress: Not on file  Relationships  . Social Herbalist on phone: Not on file    Gets together: Not on file  Attends religious service: Not on file    Active member of club or organization: Not on file    Attends meetings of clubs or organizations: Not on file    Relationship status: Not on file  Other Topics Concern  . Not on file  Social History Narrative  . Not on file     Observations/Objective: Awake, alert and oriented x 3   Review of Systems  Constitutional: Negative for fever, malaise/fatigue and weight loss.  HENT: Negative.  Negative for nosebleeds.   Eyes: Negative.  Negative for blurred vision, double vision and  photophobia.  Respiratory: Negative.  Negative for cough and shortness of breath.   Cardiovascular: Negative.  Negative for chest pain, palpitations and leg swelling.  Gastrointestinal: Positive for constipation. Negative for heartburn, nausea and vomiting.  Musculoskeletal: Negative.  Negative for myalgias.  Neurological: Negative.  Negative for dizziness, focal weakness, seizures and headaches.  Psychiatric/Behavioral: Positive for depression. Negative for suicidal ideas. The patient is nervous/anxious.     Assessment and Plan: Abiela was seen today for follow-up.  Diagnoses and all orders for this visit:  Anxiety and depression -     sertraline (ZOLOFT) 50 MG tablet; Take 1 tablet (50 mg total) by mouth daily. Continue hydroxyzine as prescribed.   Dyslipidemia, goal LDL below 70 -     atorvastatin (LIPITOR) 10 MG tablet; Take 1 tablet (10 mg total) by mouth daily.  Constipation, unspecified constipation type -     metoCLOPramide (REGLAN) 5 MG tablet; Take 1 tablet (5 mg total) by mouth 3 (three) times daily before meals.     Follow Up Instructions Return in about 3 weeks (around 05/02/2019).     I discussed the assessment and treatment plan with the patient. The patient was provided an opportunity to ask questions and all were answered. The patient agreed with the plan and demonstrated an understanding of the instructions.   The patient was advised to call back or seek an in-person evaluation if the symptoms worsen or if the condition fails to improve as anticipated.  I provided 21 minutes of non-face-to-face time during this encounter including median intraservice time, reviewing previous notes, labs, imaging, medications and explaining diagnosis and management.  Gildardo Pounds, FNP-BC

## 2019-04-12 ENCOUNTER — Encounter: Payer: Self-pay | Admitting: Nurse Practitioner

## 2019-04-12 MED FILL — ?HUMALOG 100 UNITS/ML VIAL: 100 | 28 days supply | Qty: 10 | Fill #1

## 2019-04-20 ENCOUNTER — Encounter: Payer: Self-pay | Admitting: Internal Medicine

## 2019-04-20 ENCOUNTER — Other Ambulatory Visit: Payer: Self-pay

## 2019-04-20 ENCOUNTER — Ambulatory Visit: Payer: Self-pay | Admitting: Internal Medicine

## 2019-04-20 VITALS — BP 120/78 | HR 103 | Temp 98.3°F | Ht 66.0 in | Wt 166.0 lb

## 2019-04-20 DIAGNOSIS — IMO0001 Reserved for inherently not codable concepts without codable children: Secondary | ICD-10-CM

## 2019-04-20 DIAGNOSIS — E1065 Type 1 diabetes mellitus with hyperglycemia: Secondary | ICD-10-CM

## 2019-04-20 LAB — POCT GLYCOSYLATED HEMOGLOBIN (HGB A1C): Hemoglobin A1C: 8.6 % — AB (ref 4.0–5.6)

## 2019-04-20 MED ORDER — BASAGLAR KWIKPEN 100 UNIT/ML ~~LOC~~ SOPN: 16.0000 [IU] | PEN_INJECTOR | Freq: Every day | SUBCUTANEOUS | 6 refills | Status: DC

## 2019-04-20 MED ORDER — INSULIN LISPRO 100 UNIT/ML ~~LOC~~ SOLN: 7.0000 [IU] | Freq: Three times a day (TID) | SUBCUTANEOUS | 11 refills | Status: DC

## 2019-04-20 NOTE — Patient Instructions (Addendum)
-   Decrease Basaglar to 16 units daily  - Increase Humalog to 7 units  - Humalog correctional insulin: ADD extra units on insulin to your meal-time Humalog dose if your blood sugars are higher than . Use the scale below to help guide you:   Blood sugar before meal Number of units to inject  Less than 180 0 unit  181-  230 1 units  231 -  280 2 units  281 -  330 3 units  331 -  380 4 units  381 -  430 5 units       - Check sugar before meals and bedtime      HOW TO TREAT LOW BLOOD SUGARS (Blood sugar LESS THAN 70 MG/DL)  Please follow the RULE OF 15 for the treatment of hypoglycemia treatment (when your (blood sugars are less than 70 mg/dL)    STEP 1: Take 15 grams of carbohydrates when your blood sugar is low, which includes:   3-4 GLUCOSE TABS  OR  3-4 OZ OF JUICE OR REGULAR SODA OR  ONE TUBE OF GLUCOSE GEL     STEP 2: RECHECK blood sugar in 15 MINUTES STEP 3: If your blood sugar is still low at the 15 minute recheck --> then, go back to STEP 1 and treat AGAIN with another 15 grams of carbohydrates.

## 2019-04-20 NOTE — Progress Notes (Signed)
Name: [name]  Age/ Sex: 33 y.o., female   MRN/ DOB: [id number], 1985/09/01     PCP: Gildardo Pounds, NP   Reason for Endocrinology Evaluation: Type 1 Diabetes Mellitus  Initial Endocrine Consultative Visit: 09/13/2018    PATIENT IDENTIFIER: [name] is a 33 y.o. female with a past medical history of T1DM. The patient has followed with Endocrinology clinic since 09/13/2018 for consultative assistance with management of her diabetes.  DIABETIC HISTORY:  [name] was diagnosed with T1DM at age 3, and has been on insulin since her diagnosis. She was on a pump in 2015 for just a few months but due to cost this was stopped.Over the years she has had intermittent care due to lack of health insurance. Her hemoglobin A1c has ranged from 7.7% in 2018, peaking at 13.2% in 2009.  Last DKA was in 03/2018  SUBJECTIVE:   During the last visit (10/04/2018): Her A1c was 9.5% . She was switched from NPH insulin to basaglar and humalog  through patient assistance program   Today (04/20/2019): [name] is here for a follow up on diabetes management. She checks her blood sugars 3 times daily, preprandial . The patient has  had hypoglycemic episodes since the last clinic visit, which typically occur 2 x /week - most often occuring during the day. The patient is symptomatic with these episodes, with symptoms of dizziness. Otherwise, the patient has not required any recent emergency interventions for hypoglycemia and has not had recent hospitalizations secondary to hyper or hypoglycemic episodes.    ROS: As per HPI and as detailed below: Review of Systems  Constitutional: Negative for fever.  HENT: Negative for congestion and sore throat.   Respiratory: Positive for cough. Negative for shortness of breath.   Cardiovascular: Negative for chest pain and palpitations.      HOME DIABETES REGIMEN:  Basaglar 16 units daily - takes 20 units  Humalog 5 units TIDQAC  GLUCOSE LOG:    Date  breakfast  lunch  supper  04/20/19 113 142   9/22 257 189   9/21 75 103   9/20 200 132 55  9/19 301 378      HISTORY:  Past Medical History:  Past Medical History:  Diagnosis Date  . Diabetes mellitus without complication [facility])     Past Surgical History: No past surgical history on file.  Social History:  reports that she is a non-smoker but has been exposed to tobacco smoke. She has never used smokeless tobacco. She reports current alcohol use. She reports that she does not use drugs. Family History:  Family History  Problem Relation Age of Onset  . Throat cancer Mother   . Diabetes Maternal Aunt      HOME MEDICATIONS: Allergies as of 04/20/2019      Reactions   Lexapro [escitalopram Oxalate]    NUMBNESS      Medication List       Accurate as of April 20, 2019  2:55 PM. If you have any questions, ask your nurse or doctor.        atorvastatin 10 MG tablet Commonly known as: LIPITOR Take 1 tablet (10 mg total) by mouth daily.   Basaglar KwikPen 100 UNIT/ML Sopn Inject 0.16 mLs (16 Units total) into the skin daily for 30 days. INJECT 16 UNITS UNDER THE SKIN ONCE DAILY. What changed: how much to take   blood glucose meter kit and supplies Kit Dispense based on patient and insurance preference.  Use up to four times daily as directed. (FOR ICD-9 250.00, 250.01).   CULTURELLE PROBIOTICS PO Take by mouth.   ferrous sulfate 325 (65 FE) MG EC tablet Take 325 mg by mouth daily with breakfast.   glucose blood test strip Commonly known as: True Metrix Blood Glucose Test Use as instructed   hydrOXYzine 25 MG tablet Commonly known as: ATARAX/VISTARIL Take 1 tablet (25 mg total) by mouth 3 (three) times daily as needed for anxiety.   insulin lispro 100 UNIT/ML injection Commonly known as: HumaLOG Inject 0.05 mLs (5 Units total) into the skin 3 (three) times daily before meals.   INSULIN SYRINGE .5CC/29G 29G X 1/2" 0.5 ML Misc 1 each by Does not apply  route 4 (four) times daily - after meals and at bedtime.   Lancet Device Misc 1 each by Does not apply route 4 (four) times daily - after meals and at bedtime.   metoCLOPramide 5 MG tablet Commonly known as: Reglan Take 1 tablet (5 mg total) by mouth 3 (three) times daily before meals.   polyethylene glycol powder 17 GM/SCOOP powder Commonly known as: GLYCOLAX/MIRALAX Take 17 g by mouth 2 (two) times daily as needed.   sertraline 50 MG tablet Commonly known as: ZOLOFT Take 1 tablet (50 mg total) by mouth daily.   True Metrix Air Glucose Meter Devi 1 each by Does not apply route 4 (four) times daily -  with meals and at bedtime.   True Metrix Meter w/Device Kit Use as instructed. Monitor blood glucose levels 4 times per day   vitamin C 100 MG tablet Take 100 mg by mouth daily.        OBJECTIVE:   Vital Signs: BP 120/78 (BP Location: Left Arm, Patient Position: Sitting, Cuff Size: Normal)   Pulse (!) 103   Temp 98.3 F (36.8 C)   Ht _0  (1.676 m)   Wt 166 lb (75.3 kg)   SpO2 99%   BMI 26.79 kg/m   Wt Readings from Last 3 Encounters:  04/20/19 166 lb (75.3 kg)  03/08/19 168 lb 12.8 oz (76.6 kg)  10/04/18 167 lb 3.2 oz (75.8 kg)     Exam: General: Pt appears well and is in NAD  Lungs: Clear with good BS bilat with no rales, rhonchi, or wheezes  Heart: RRR with normal S1 and S2 and no gallops; no murmurs; no rub  Abdomen: Normoactive bowel sounds, soft, nontender, without masses or organomegaly palpable  Extremities: No pretibial edema. No tremor.  Skin: Normal texture and temperature to palpation. No rash noted. No Acanthosis nigricans/skin tags.  Neuro: MS is good with appropriate affect, pt is alert and Ox3     DATA REVIEWED:  Lab Results  Component Value Date   HGBA1C 7.9 (A) 10/04/2018   HGBA1C 9.5 (A) 06/21/2018   HGBA1C 9.6 (H) 04/16/2018   Lab Results  Component Value Date   MICROALBUR 0.5 03/11/2016   LDLCALC 97 03/09/2019   CREATININE 0.84  03/09/2019     Lab Results  Component Value Date   CHOL 190 03/09/2019   HDL 84 03/09/2019   LDLCALC 97 03/09/2019   TRIG 47 03/09/2019   CHOLHDL 2.3 03/09/2019         ASSESSMENT / PLAN / RECOMMENDATIONS:   1) Type 1 Diabetes Mellitus, Poorly controlled, With out complications - Most recent A1c of 8.6 %. Goal A1c < 7.0 %.     - Unfortunately her glucose control has worsened since her last visit  here which was in 09/2018 - Pt is currently on too much basal which causes hypoglycemia but not on enough prandial insulin so she continues with hyperglycemia during the day.  - Will adjust insulin regimen as below  - A correctional scale will be provided based on pt request     MEDICATIONS:  Decrease Basaglar to 16 units daily  Increase Humalog to 7 units q. AC 3 times daily  CF:( BG-130/50)  EDUCATION / INSTRUCTIONS:  BG monitoring instructions: Patient is instructed to check her blood sugars 4 times a day, before meals and bedtime.  Call [facility] if: BG persistently < 70 or > 300. . I reviewed the Rule of 15 for the treatment of hypoglycemia in detail with the patient. Literature supplied.    F/U in 3 months   Signed electronically by: Mack Guise, MD  [facility]  [facility] [place], [place] [address] Phone: [phone] FAX: [phone]   CC: Gildardo Pounds, NP [place] [address] Phone: [phone]  Fax: [phone]  Return to Endocrinology clinic as below: Future Appointments  Date Time Provider [place]  05/09/2019  1:30 PM Gildardo Pounds, NP [facility] None

## 2019-04-21 MED FILL — ?BASAGLAR 100 UNITS/ML KWPE: 100 | 36 days supply | Qty: 6 | Fill #0

## 2019-05-09 ENCOUNTER — Ambulatory Visit: Payer: Self-pay | Attending: Nurse Practitioner | Admitting: Nurse Practitioner

## 2019-05-09 ENCOUNTER — Other Ambulatory Visit: Payer: Self-pay

## 2019-05-31 ENCOUNTER — Inpatient Hospital Stay (HOSPITAL_COMMUNITY)
Admission: EM | Admit: 2019-05-31 | Discharge: 2019-06-03 | DRG: 638 | Disposition: A | Discharge status: Home / Self Care | Payer: Self-pay | Attending: Internal Medicine | Admitting: Internal Medicine

## 2019-05-31 ENCOUNTER — Encounter (HOSPITAL_COMMUNITY): Payer: Self-pay | Admitting: Emergency Medicine

## 2019-05-31 ENCOUNTER — Other Ambulatory Visit: Payer: Self-pay

## 2019-05-31 ENCOUNTER — Emergency Department (HOSPITAL_COMMUNITY): Payer: Self-pay

## 2019-05-31 DIAGNOSIS — E1010 Type 1 diabetes mellitus with ketoacidosis without coma: Principal | ICD-10-CM | POA: Diagnosis present

## 2019-05-31 DIAGNOSIS — E871 Hypo-osmolality and hyponatremia: Secondary | ICD-10-CM | POA: Diagnosis present

## 2019-05-31 DIAGNOSIS — E876 Hypokalemia: Secondary | ICD-10-CM | POA: Diagnosis not present

## 2019-05-31 DIAGNOSIS — E785 Hyperlipidemia, unspecified: Secondary | ICD-10-CM | POA: Diagnosis present

## 2019-05-31 DIAGNOSIS — Z888 Allergy status to other drugs, medicaments and biological substances status: Secondary | ICD-10-CM

## 2019-05-31 DIAGNOSIS — F419 Anxiety disorder, unspecified: Secondary | ICD-10-CM

## 2019-05-31 DIAGNOSIS — Z808 Family history of malignant neoplasm of other organs or systems: Secondary | ICD-10-CM

## 2019-05-31 DIAGNOSIS — Z20828 Contact with and (suspected) exposure to other viral communicable diseases: Secondary | ICD-10-CM | POA: Diagnosis present

## 2019-05-31 DIAGNOSIS — F32A Depression, unspecified: Secondary | ICD-10-CM

## 2019-05-31 DIAGNOSIS — Z794 Long term (current) use of insulin: Secondary | ICD-10-CM

## 2019-05-31 DIAGNOSIS — F329 Major depressive disorder, single episode, unspecified: Secondary | ICD-10-CM | POA: Diagnosis present

## 2019-05-31 DIAGNOSIS — Z7722 Contact with and (suspected) exposure to environmental tobacco smoke (acute) (chronic): Secondary | ICD-10-CM | POA: Diagnosis present

## 2019-05-31 DIAGNOSIS — Z833 Family history of diabetes mellitus: Secondary | ICD-10-CM

## 2019-05-31 DIAGNOSIS — E1110 Type 2 diabetes mellitus with ketoacidosis without coma: Secondary | ICD-10-CM | POA: Diagnosis present

## 2019-05-31 HISTORY — DX: Type 1 diabetes mellitus without complications: E10.9

## 2019-05-31 HISTORY — DX: Type 1 diabetes mellitus with ketoacidosis without coma: E10.10

## 2019-05-31 LAB — BASIC METABOLIC PANEL
Anion gap: 16 — ABNORMAL HIGH (ref 5–15)
Anion gap: 12 (ref 5–15)
BUN: 15 mg/dL (ref 6–20)
BUN: 13 mg/dL (ref 6–20)
CO2: 9 mmol/L — ABNORMAL LOW (ref 22–32)
CO2: 10 mmol/L — ABNORMAL LOW (ref 22–32)
Calcium: 9.2 mg/dL (ref 8.9–10.3)
Calcium: 8.3 mg/dL — ABNORMAL LOW (ref 8.9–10.3)
Chloride: 105 mmol/L (ref 98–111)
Chloride: 108 mmol/L (ref 98–111)
Creatinine, Ser: 0.84 mg/dL (ref 0.44–1.00)
Creatinine, Ser: 0.81 mg/dL (ref 0.44–1.00)
GFR calc Af Amer: >60 mL/min (ref >60)
GFR calc Af Amer: >60 mL/min (ref >60)
GFR calc non Af Amer: >60 mL/min (ref >60)
GFR calc non Af Amer: >60 mL/min (ref >60)
Glucose, Bld: 228 mg/dL — ABNORMAL HIGH (ref 70–99)
Glucose, Bld: 223 mg/dL — ABNORMAL HIGH (ref 70–99)
Potassium: 4.8 mmol/L (ref 3.5–5.1)
Potassium: 5.2 mmol/L — ABNORMAL HIGH (ref 3.5–5.1)
Sodium: 130 mmol/L — ABNORMAL LOW (ref 135–145)
Sodium: 130 mmol/L — ABNORMAL LOW (ref 135–145)

## 2019-05-31 LAB — CBG MONITORING, ED
Glucose-Capillary: 188 mg/dL — ABNORMAL HIGH (ref 70–99)
Glucose-Capillary: 190 mg/dL — ABNORMAL HIGH (ref 70–99)
Glucose-Capillary: 216 mg/dL — ABNORMAL HIGH (ref 70–99)
Glucose-Capillary: 218 mg/dL — ABNORMAL HIGH (ref 70–99)

## 2019-05-31 LAB — URINALYSIS, ROUTINE W REFLEX MICROSCOPIC
Bilirubin Urine: NEGATIVE
Glucose, UA: >=500 mg/dL — AB
Ketones, ur: 80 mg/dL — AB
Leukocytes,Ua: NEGATIVE
Nitrite: NEGATIVE
Protein, ur: 30 mg/dL — AB
Specific Gravity, Urine: 1.014 (ref 1.005–1.030)
pH: 5 (ref 5.0–8.0)

## 2019-05-31 LAB — HCG, QUANTITATIVE, PREGNANCY: hCG, Beta Chain, Quant, S: <1 m[IU]/mL (ref <5)

## 2019-05-31 LAB — CBC
HCT: 50.7 % — ABNORMAL HIGH (ref 36.0–46.0)
Hemoglobin: 15.4 g/dL — ABNORMAL HIGH (ref 12.0–15.0)
MCH: 27.3 pg (ref 26.0–34.0)
MCHC: 30.4 g/dL (ref 30.0–36.0)
MCV: 89.7 fL (ref 80.0–100.0)
Platelets: 295 10*3/uL (ref 150–400)
RBC: 5.65 MIL/uL — ABNORMAL HIGH (ref 3.87–5.11)
RDW: 12.9 % (ref 11.5–15.5)
WBC: 6.4 10*3/uL (ref 4.0–10.5)
nRBC: 0 % (ref 0.0–0.2)

## 2019-05-31 LAB — LACTIC ACID, PLASMA
Lactic Acid, Venous: 1 mmol/L (ref 0.5–1.9)
Lactic Acid, Venous: 1 mmol/L (ref 0.5–1.9)

## 2019-05-31 LAB — BLOOD GAS, VENOUS
Acid-base deficit: 14.5 mmol/L — ABNORMAL HIGH (ref 0.0–2.0)
Bicarbonate: 12.2 mmol/L — ABNORMAL LOW (ref 20.0–28.0)
O2 Saturation: 37.1 %
Patient temperature: 98.6
pCO2, Ven: 31.2 mmHg — ABNORMAL LOW (ref 44.0–60.0)
pH, Ven: 7.215 — ABNORMAL LOW (ref 7.250–7.430)

## 2019-05-31 LAB — BETA-HYDROXYBUTYRIC ACID: Beta-Hydroxybutyric Acid: 5.71 mmol/L — ABNORMAL HIGH (ref 0.05–0.27)

## 2019-05-31 MED ORDER — MORPHINE SULFATE (PF) 2 MG/ML IV SOLN
1.0000 mg | Freq: Once | INTRAVENOUS | Status: AC
  Administered 2019-05-31: 1 mg via INTRAVENOUS
  Filled 2019-05-31: qty 1

## 2019-05-31 MED ORDER — INSULIN REGULAR(HUMAN) IN NACL 100-0.9 UT/100ML-% IV SOLN
INTRAVENOUS | Status: DC
  Filled 2019-05-31: qty 100

## 2019-05-31 MED ORDER — ENOXAPARIN SODIUM 40 MG/0.4ML ~~LOC~~ SOLN
40.0000 mg | Freq: Every day | SUBCUTANEOUS | Status: DC
  Administered 2019-05-31 – 2019-06-02 (×3): 40 mg via SUBCUTANEOUS
  Filled 2019-05-31 (×3): qty 0.4

## 2019-05-31 MED ORDER — DEXTROSE-NACL 5-0.45 % IV SOLN: INTRAVENOUS | Status: DC

## 2019-05-31 MED ORDER — INSULIN REGULAR(HUMAN) IN NACL 100-0.9 UT/100ML-% IV SOLN
INTRAVENOUS | Status: DC
  Administered 2019-05-31: 1.3 [IU]/h via INTRAVENOUS
  Administered 2019-06-01: 2.5 [IU]/h via INTRAVENOUS
  Administered 2019-06-01: 6.6 [IU]/h via INTRAVENOUS

## 2019-05-31 MED ORDER — DEXTROSE-NACL 5-0.45 % IV SOLN
INTRAVENOUS | Status: DC
  Administered 2019-05-31 – 2019-06-01 (×3): via INTRAVENOUS

## 2019-05-31 MED ORDER — SODIUM CHLORIDE 0.9 % IV SOLN: INTRAVENOUS | Status: DC

## 2019-05-31 MED ORDER — POTASSIUM CHLORIDE 10 MEQ/100ML IV SOLN
10.0000 meq | INTRAVENOUS | Status: AC
  Administered 2019-05-31: 10 meq via INTRAVENOUS
  Filled 2019-05-31: qty 100

## 2019-05-31 MED ORDER — SODIUM CHLORIDE 0.9 % IV BOLUS
1000.0000 mL | Freq: Once | INTRAVENOUS | Status: AC
  Administered 2019-05-31: 1000 mL via INTRAVENOUS

## 2019-05-31 MED ORDER — SODIUM CHLORIDE 0.9 % IV SOLN
INTRAVENOUS | Status: DC
  Administered 2019-05-31: 23:00:00 via INTRAVENOUS

## 2019-05-31 NOTE — ED Notes (Signed)
Patient ambulatory to restroom with minimal assistance

## 2019-05-31 NOTE — ED Notes (Signed)
Date and time results received: 05/31/19 9:50 PM  Test: pO2 Critical Value: below reportable range   Name of Provider Notified: Roslynn Amble MD

## 2019-05-31 NOTE — H&P (Signed)
TRH H&P    Patient Demographics:    [name], is a 33 y.o. female  MRN: [id number]  DOB - Jul 05, 1986  Admit Date - 05/31/2019  Referring MD/NP/PA:   Roslynn Amble  Outpatient Primary MD for the patient is Gildardo Pounds, NP  Patient coming from:  home  Chief complaint-  dka   HPI:    [name]  is a 33 y.o. female, w Dm1, who presents with n/v x2 days, and hyperglycemia per patient. Pt felt like she has DKA and therefore presented to ED.   In ED,  T 98.2, P 112, R 20 Bp 148/102, pxo 100% on RA  CXR IMPRESSION: No acute cardiopulmonary abnormality.  Urinalysis ketones 80  Na 130, K 4.8, Bun 15, Creatinine 0.84 Glucose 228 Hco3 9  Wbc 6.4, Hgb 15.4, Plt 295 ABG Ph 7.215  Pt will be admitted for DKA   Review of systems:    In addition to the HPI above,  No Fever-chills, No Headache, No changes with Vision or hearing, No problems swallowing food or Liquids, No Chest pain, Cough or Shortness of Breath, No Abdominal pain,  , bowel movements are regular, No Blood in stool or Urine, No dysuria, No new skin rashes or bruises, No new joints pains-aches,  No new weakness, tingling, numbness in any extremity, No recent weight gain or loss, No polyuria, polydypsia or polyphagia, No significant Mental Stressors.  All other systems reviewed and are negative.    Past History of the following :    Past Medical History:  Diagnosis Date  . Diabetes mellitus without complication ([place])   . DKA, type 1 ([place])   . Type 1 diabetes ([place])   . Type 1 diabetes mellitus ([place])       History reviewed. No pertinent surgical history.    Social History:      Social History   Tobacco Use  . Smoking status: Passive Smoke Exposure - Never Smoker  . Smokeless tobacco: Never Used  Substance Use Topics  . Alcohol use: Yes    Comment: occasionally        Family History :     Family  History  Problem Relation Age of Onset  . Throat cancer Mother   . Diabetes Maternal Aunt        Home Medications:   Prior to Admission medications   Medication Sig Start Date End Date Taking? Authorizing Provider  Ascorbic Acid (VITAMIN C) 100 MG tablet Take 100 mg by mouth daily.   Yes [provider]  atorvastatin (LIPITOR) 10 MG tablet Take 1 tablet (10 mg total) by mouth daily. 04/11/19  Yes Gildardo Pounds, NP  hydrOXYzine (ATARAX/VISTARIL) 25 MG tablet Take 1 tablet (25 mg total) by mouth 3 (three) times daily as needed for anxiety. 03/08/19  Yes Gildardo Pounds, NP  Insulin Glargine (BASAGLAR KWIKPEN) 100 UNIT/ML SOPN Inject 0.16 mLs (16 Units total) into the skin daily. INJECT 16 UNITS UNDER THE SKIN ONCE DAILY. 04/20/19 05/31/19 Yes Shamleffer, Melanie Crazier, MD  insulin lispro (  HUMALOG) 100 UNIT/ML injection Inject 0.07 mLs (7 Units total) into the skin 3 (three) times daily before meals. Max daily 35 units 04/20/19  Yes Shamleffer, Melanie Crazier, MD  polyethylene glycol powder (GLYCOLAX/MIRALAX) 17 GM/SCOOP powder Take 17 g by mouth 2 (two) times daily as needed. 03/08/19  Yes Gildardo Pounds, NP  Probiotic Product (CULTURELLE PROBIOTICS PO) Take by mouth.   Yes [provider]  sertraline (ZOLOFT) 50 MG tablet Take 1 tablet (50 mg total) by mouth daily. 04/11/19  Yes Gildardo Pounds, NP  blood glucose meter kit and supplies KIT Dispense based on patient and insurance preference. Use up to four times daily as directed. (FOR ICD-9 250.00, 250.01). 04/18/18   Kayleen Memos, DO  Blood Glucose Monitoring Suppl (TRUE METRIX AIR GLUCOSE METER) DEVI 1 each by Does not apply route 4 (four) times daily -  with meals and at bedtime. 07/25/15   [name], FNP  Blood Glucose Monitoring Suppl (TRUE METRIX METER) w/Device KIT Use as instructed. Monitor blood glucose levels 4 times per day Patient not taking: Reported on 03/08/2019 11/16/18   Gildardo Pounds, NP  ferrous  sulfate 325 (65 FE) MG EC tablet Take 325 mg by mouth daily with breakfast.    [provider]  glucose blood (TRUE METRIX BLOOD GLUCOSE TEST) test strip Use as instructed 11/16/18   Gildardo Pounds, NP  INSULIN SYRINGE .5CC/29G 29G X 1/2" 0.5 ML MISC 1 each by Does not apply route 4 (four) times daily - after meals and at bedtime. 11/16/18   Gildardo Pounds, NP  Lancet Device MISC 1 each by Does not apply route 4 (four) times daily - after meals and at bedtime. 11/16/18   Gildardo Pounds, NP  metoCLOPramide (REGLAN) 5 MG tablet Take 1 tablet (5 mg total) by mouth 3 (three) times daily before meals. 04/11/19 05/11/19  Gildardo Pounds, NP     Allergies:     Allergies  Allergen Reactions  . Lexapro [Escitalopram Oxalate]     NUMBNESS     Physical Exam:   Vitals  Blood pressure (!) 152/93, pulse (!) 105, temperature 98.2 F (36.8 C), temperature source Oral, resp. rate (!) 24, last menstrual period 05/15/2019, SpO2 100 %.  1.  General: axoxo3  2. Psychiatric: euthymic  3. Neurologic: cn2-12 intact, reflexes 2+ symmetric, diffuse with no clonus, motor 5/5 in all 4 ext  4. HEENMT:  Anicteric, pupils 1.56m symmetric, direct, consensual intact Neck: no jvd  5. Respiratory : CTAB  6. Cardiovascular : Tachy s1, s2, no m/g/r  7. Gastrointestinal:  Abd: soft, nt, nd, +bs  8. Skin:  Ext: no c/c/e,  No rash  9.Musculoskeletal:  Good ROM    Data Review:    CBC Recent Labs  Lab 05/31/19 1647  WBC 6.4  HGB 15.4*  HCT 50.7*  PLT 295  MCV 89.7  MCH 27.3  MCHC 30.4  RDW 12.9   ------------------------------------------------------------------------------------------------------------------  Results for orders placed or performed during the hospital encounter of 05/31/19 (from the past 48 hour(s))  CBG monitoring, ED     Status: Abnormal   Collection Time: 05/31/19  3:58 PM  Result Value Ref Range   Glucose-Capillary 216 (H) 70 - 99 mg/dL  Urinalysis,  Routine w reflex microscopic     Status: Abnormal   Collection Time: 05/31/19  4:03 PM  Result Value Ref Range   Color, Urine STRAW (A) YELLOW   APPearance CLEAR CLEAR  Specific Gravity, Urine 1.014 1.005 - 1.030   pH 5.0 5.0 - 8.0   Glucose, UA >=500 (A) NEGATIVE mg/dL   Hgb urine dipstick SMALL (A) NEGATIVE   Bilirubin Urine NEGATIVE NEGATIVE   Ketones, ur 80 (A) NEGATIVE mg/dL   Protein, ur 30 (A) NEGATIVE mg/dL   Nitrite NEGATIVE NEGATIVE   Leukocytes,Ua NEGATIVE NEGATIVE   RBC / HPF 0-5 0 - 5 RBC/hpf   WBC, UA 0-5 0 - 5 WBC/hpf   Bacteria, UA RARE (A) NONE SEEN   Squamous Epithelial / LPF 0-5 0 - 5   Mucus PRESENT     Comment: Performed at [facility], [place] [address] [place], [place] [address]  Basic metabolic panel     Status: Abnormal   Collection Time: 05/31/19  4:47 PM  Result Value Ref Range   Sodium 130 (L) 135 - 145 mmol/L   Potassium 4.8 3.5 - 5.1 mmol/L   Chloride 105 98 - 111 mmol/L   CO2 9 (L) 22 - 32 mmol/L   Glucose, Bld 228 (H) 70 - 99 mg/dL   BUN 15 6 - 20 mg/dL   Creatinine, Ser 0.84 0.44 - 1.00 mg/dL   Calcium 9.2 8.9 - 10.3 mg/dL   GFR calc non Af Amer >60 >60 mL/min   GFR calc Af Amer >60 >60 mL/min   Anion gap 16 (H) 5 - 15    Comment: Performed at [facility], [place] [address], [place], [place] [address]  CBC     Status: Abnormal   Collection Time: 05/31/19  4:47 PM  Result Value Ref Range   WBC 6.4 4.0 - 10.5 K/uL   RBC 5.65 (H) 3.87 - 5.11 MIL/uL   Hemoglobin 15.4 (H) 12.0 - 15.0 g/dL   HCT 50.7 (H) 36.0 - 46.0 %   MCV 89.7 80.0 - 100.0 fL   MCH 27.3 26.0 - 34.0 pg   MCHC 30.4 30.0 - 36.0 g/dL   RDW 12.9 11.5 - 15.5 %   Platelets 295 150 - 400 K/uL   nRBC 0.0 0.0 - 0.2 %    Comment: Performed at [facility], [place] [address], [place], [place] [address]  hCG, quantitative, pregnancy     Status: None   Collection Time: 05/31/19  4:47 PM  Result Value Ref Range   hCG, Beta Chain,  Quant, S <1 <5 mIU/mL    Comment:          GEST. AGE      CONC.  (mIU/mL)   <=1 WEEK        5 - 50     2 WEEKS       50 - 500     3 WEEKS       100 - 10,000     4 WEEKS     1,000 - 30,000     5 WEEKS     3,500 - 115,000   6-8 WEEKS     12,000 - 270,000    12 WEEKS     15,000 - 220,000        FEMALE AND NON-PREGNANT FEMALE:     LESS THAN 5 mIU/mL Performed at [facility], [place] [address] [place], [place] [address]   CBG monitoring, ED     Status: Abnormal   Collection Time: 05/31/19  8:04 PM  Result Value Ref Range   Glucose-Capillary 218 (H) 70 - 99 mg/dL  Blood gas, venous (at [facility]  and [facility], not at [facility])     Status: Abnormal   Collection Time: 05/31/19  9:27 PM  Result Value Ref Range   pH, Ven 7.215 (L) 7.250 - 7.430   pCO2, Ven 31.2 (L) 44.0 - 60.0 mmHg   pO2, Ven BELOW REPORTABLE RANGE 32.0 - 45.0 mmHg    Comment: CRITICAL RESULT CALLED TO, READ BACK BY AND VERIFIED WITH: TAYLOR HAMLETT @ 2150 ON 05/31/2019 C VARNER    Bicarbonate 12.2 (L) 20.0 - 28.0 mmol/L   Acid-base deficit 14.5 (H) 0.0 - 2.0 mmol/L   O2 Saturation 37.1 %   Patient temperature 98.6     Comment: Performed at [facility], [place] [address] [place], [place] [address]  Lactic acid, plasma     Status: None   Collection Time: 05/31/19  9:27 PM  Result Value Ref Range   Lactic Acid, Venous 1.0 0.5 - 1.9 mmol/L    Comment: Performed at [facility], [place] [address], [place], [place] [address]    Chemistries  Recent Labs  Lab 05/31/19 1647  NA 130*  K 4.8  CL 105  CO2 9*  GLUCOSE 228*  BUN 15  CREATININE 0.84  CALCIUM 9.2   ------------------------------------------------------------------------------------------------------------------  ------------------------------------------------------------------------------------------------------------------ GFR: CrCl cannot be calculated (Unknown ideal weight.). Liver Function Tests: No results for  input(s): AST, ALT, ALKPHOS, BILITOT, PROT, ALBUMIN in the last 168 hours. No results for input(s): LIPASE, AMYLASE in the last 168 hours. No results for input(s): AMMONIA in the last 168 hours. Coagulation Profile: No results for input(s): INR, PROTIME in the last 168 hours. Cardiac Enzymes: No results for input(s): CKTOTAL, CKMB, CKMBINDEX, TROPONINI in the last 168 hours. BNP (last 3 results) No results for input(s): PROBNP in the last 8760 hours. HbA1C: No results for input(s): HGBA1C in the last 72 hours. CBG: Recent Labs  Lab 05/31/19 1558 05/31/19 2004  GLUCAP 216* 218*   Lipid Profile: No results for input(s): CHOL, HDL, LDLCALC, TRIG, CHOLHDL, LDLDIRECT in the last 72 hours. Thyroid Function Tests: No results for input(s): TSH, T4TOTAL, FREET4, T3FREE, THYROIDAB in the last 72 hours. Anemia Panel: No results for input(s): VITAMINB12, FOLATE, FERRITIN, TIBC, IRON, RETICCTPCT in the last 72 hours.  --------------------------------------------------------------------------------------------------------------- Urine analysis:    Component Value Date/Time   COLORURINE STRAW (A) 05/31/2019 1603   APPEARANCEUR CLEAR 05/31/2019 1603   LABSPEC 1.014 05/31/2019 1603   PHURINE 5.0 05/31/2019 1603   GLUCOSEU >=500 (A) 05/31/2019 1603   HGBUR SMALL (A) 05/31/2019 1603   BILIRUBINUR NEGATIVE 05/31/2019 1603   BILIRUBINUR neg 06/21/2018 0845   KETONESUR 80 (A) 05/31/2019 1603   PROTEINUR 30 (A) 05/31/2019 1603   UROBILINOGEN 0.2 06/21/2018 0845   UROBILINOGEN 0.2 08/07/2016 1610   NITRITE NEGATIVE 05/31/2019 1603   LEUKOCYTESUR NEGATIVE 05/31/2019 1603      Imaging Results:    Dg Chest 2 View  Result Date: 05/31/2019 CLINICAL DATA:  Shortness of breath, DKA, evaluate for pneumonia EXAM: CHEST - 2 VIEW COMPARISON:  Radiograph and CT 04/15/2018 FINDINGS: No consolidation, features of edema, pneumothorax, or effusion. Pulmonary vascularity is normally distributed. The  cardiomediastinal contours are unremarkable. No acute osseous or soft tissue abnormality. IMPRESSION: No acute cardiopulmonary abnormality. Electronically Signed   By: Lovena Le M.D.   On: 05/31/2019 20:34       Assessment & Plan:    Principal Problem:   DKA (diabetic ketoacidoses) ([place])  DKA NPO except for ice chips NS iv Insulin iv Bmp q4h  DVT Prophylaxis-   Lovenox - SCDs   AM Labs Ordered, also please review Full Orders  Family Communication: Admission, patients condition and plan of care including tests being ordered have been discussed with the patient  who indicate understanding and agree with the plan and Code Status.  Code Status:  FULL CODE per patient, left message for mother that patient admitted to [facility]  Admission status: Inpatient: Based on patients clinical presentation and evaluation of above clinical data, I have made determination that patient meets Inpatient criteria at this time.   Pt will require iv fluids and iv insulin,  Pt has high risk of clinical deterioration. Pt will require > 2 nites stay.   Time spent in minutes :  55 minutes critical care time.    Jani Gravel M.D on 05/31/2019 at 10:43 PM

## 2019-05-31 NOTE — ED Provider Notes (Signed)
[facility] DEPT Provider Note   CSN: [id number] Arrival date & time: 05/31/19  1527     History   Chief Complaint Chief Complaint  Patient presents with  . Hyperglycemia    HPI [name] is a 33 y.o. female.  Presents emerged from with chief complaint of elevated blood sugars, generalized weakness, shortness of breath.  Patient states symptoms are going on for the last few days to couple weeks.  States have been trying to stay hydrated and keep her electrolytes up by drinking Gatorade.  States when she woke up this morning her insulin was elevated and took some extra insulin.  9 units this morning.  Has had nausea, a couple episodes of vomiting, nonbloody nonbilious.  No associated abdominal pain.  No new cough.  No new chest pain.     HPI  Past Medical History:  Diagnosis Date  . Diabetes mellitus without complication ([place])   . DKA, type 1 ([place])   . Type 1 diabetes ([place])   . Type 1 diabetes mellitus [facility])     Patient Active Problem List   Diagnosis Date Noted  . Diabetic ketoacidosis ([place]) 04/15/2018  . Refusal of blood transfusion for reasons of conscience 12/26/2017  . DKA, type 1 ([place]) 05/19/2017  . Colitis   . Prolonged QT interval   . Hyperpigmentation of skin, postinflammatory 08/07/2016  . Dizziness 04/25/2016  . DKA (diabetic ketoacidoses) ([place]) 04/25/2016  . Rhinitis, allergic 10/11/2015  . Diabetes type 1, uncontrolled ([place]) 07/25/2015  . Cephalalgia 07/25/2015  . Generalized anxiety disorder 07/25/2015  . Depression 07/25/2015  . Diabetic ketoacidosis without coma associated with type 1 diabetes mellitus ([place]) 07/11/2012  . Leukocytosis 07/11/2012  . Hyperkalemia 07/11/2012  . High anion gap metabolic acidosis 63/07/6008    History reviewed. No pertinent surgical history.   OB History   No obstetric history on file.      Home Medications    Prior to Admission medications   Medication Sig Start Date End  Date Taking? Authorizing Provider  Ascorbic Acid (VITAMIN C) 100 MG tablet Take 100 mg by mouth daily.   Yes [provider]  atorvastatin (LIPITOR) 10 MG tablet Take 1 tablet (10 mg total) by mouth daily. 04/11/19  Yes Gildardo Pounds, NP  hydrOXYzine (ATARAX/VISTARIL) 25 MG tablet Take 1 tablet (25 mg total) by mouth 3 (three) times daily as needed for anxiety. 03/08/19  Yes Gildardo Pounds, NP  Insulin Glargine (BASAGLAR KWIKPEN) 100 UNIT/ML SOPN Inject 0.16 mLs (16 Units total) into the skin daily. INJECT 16 UNITS UNDER THE SKIN ONCE DAILY. 04/20/19 05/31/19 Yes Shamleffer, Melanie Crazier, MD  insulin lispro (HUMALOG) 100 UNIT/ML injection Inject 0.07 mLs (7 Units total) into the skin 3 (three) times daily before meals. Max daily 35 units 04/20/19  Yes Shamleffer, Melanie Crazier, MD  polyethylene glycol powder (GLYCOLAX/MIRALAX) 17 GM/SCOOP powder Take 17 g by mouth 2 (two) times daily as needed. 03/08/19  Yes Gildardo Pounds, NP  Probiotic Product (CULTURELLE PROBIOTICS PO) Take by mouth.   Yes [provider]  sertraline (ZOLOFT) 50 MG tablet Take 1 tablet (50 mg total) by mouth daily. 04/11/19  Yes Gildardo Pounds, NP  blood glucose meter kit and supplies KIT Dispense based on patient and insurance preference. Use up to four times daily as directed. (FOR ICD-9 250.00, 250.01). 04/18/18   Kayleen Memos, DO  Blood Glucose Monitoring Suppl (TRUE METRIX AIR GLUCOSE METER) DEVI 1 each by Does  not apply route 4 (four) times daily -  with meals and at bedtime. 07/25/15   [name], FNP  Blood Glucose Monitoring Suppl (TRUE METRIX METER) w/Device KIT Use as instructed. Monitor blood glucose levels 4 times per day Patient not taking: Reported on 03/08/2019 11/16/18   Gildardo Pounds, NP  ferrous sulfate 325 (65 FE) MG EC tablet Take 325 mg by mouth daily with breakfast.    [provider]  glucose blood (TRUE METRIX BLOOD GLUCOSE TEST) test strip Use as instructed 11/16/18    Gildardo Pounds, NP  INSULIN SYRINGE .5CC/29G 29G X 1/2" 0.5 ML MISC 1 each by Does not apply route 4 (four) times daily - after meals and at bedtime. 11/16/18   Gildardo Pounds, NP  Lancet Device MISC 1 each by Does not apply route 4 (four) times daily - after meals and at bedtime. 11/16/18   Gildardo Pounds, NP  metoCLOPramide (REGLAN) 5 MG tablet Take 1 tablet (5 mg total) by mouth 3 (three) times daily before meals. 04/11/19 05/11/19  Gildardo Pounds, NP    Family History Family History  Problem Relation Age of Onset  . Throat cancer Mother   . Diabetes Maternal Aunt     Social History Social History   Tobacco Use  . Smoking status: Passive Smoke Exposure - Never Smoker  . Smokeless tobacco: Never Used  Substance Use Topics  . Alcohol use: Yes    Comment: occasionally   . Drug use: No     Allergies   Lexapro [escitalopram oxalate]   Review of Systems Review of Systems  Constitutional: Negative for chills and fever.  HENT: Negative for ear pain and sore throat.   Eyes: Negative for pain and visual disturbance.  Respiratory: Positive for shortness of breath. Negative for cough.   Cardiovascular: Positive for chest pain. Negative for palpitations.  Gastrointestinal: Positive for nausea. Negative for abdominal pain.  Endocrine: Positive for polydipsia and polyuria.  Genitourinary: Negative for dysuria and hematuria.  Musculoskeletal: Negative for arthralgias and back pain.  Skin: Negative for color change and rash.  Neurological: Negative for seizures and syncope.  All other systems reviewed and are negative.    Physical Exam Updated Vital Signs BP 135/88 (BP Location: Right Arm)   Pulse (!) 106   Temp 98.2 F (36.8 C) (Oral)   Resp 20   LMP 05/15/2019   SpO2 98%   Physical Exam Vitals signs and nursing note reviewed.  Constitutional:      General: She is not in acute distress.    Appearance: She is well-developed.  HENT:     Head: Normocephalic and  atraumatic.  Eyes:     Conjunctiva/sclera: Conjunctivae normal.  Neck:     Musculoskeletal: Neck supple.  Cardiovascular:     Rate and Rhythm: Regular rhythm. Tachycardia present.     Heart sounds: No murmur.  Pulmonary:     Effort: Pulmonary effort is normal. No respiratory distress.     Breath sounds: Normal breath sounds.     Comments: Tachypnea mild Abdominal:     Palpations: Abdomen is soft.     Tenderness: There is no abdominal tenderness.  Musculoskeletal:        General: No swelling or tenderness.  Skin:    General: Skin is warm and dry.  Neurological:     General: No focal deficit present.     Mental Status: She is alert and oriented to person, place, and time.  ED Treatments / Results  Labs (all labs ordered are listed, but only abnormal results are displayed) Labs Reviewed  BASIC METABOLIC PANEL - Abnormal; Notable for the following components:      Result Value   Sodium 130 (*)    CO2 9 (*)    Glucose, Bld 228 (*)    Anion gap 16 (*)    All other components within normal limits  CBC - Abnormal; Notable for the following components:   RBC 5.65 (*)    Hemoglobin 15.4 (*)    HCT 50.7 (*)    All other components within normal limits  URINALYSIS, ROUTINE W REFLEX MICROSCOPIC - Abnormal; Notable for the following components:   Color, Urine STRAW (*)    Glucose, UA >=500 (*)    Hgb urine dipstick SMALL (*)    Ketones, ur 80 (*)    Protein, ur 30 (*)    Bacteria, UA RARE (*)    All other components within normal limits  CBG MONITORING, ED - Abnormal; Notable for the following components:   Glucose-Capillary 216 (*)    All other components within normal limits  CBG MONITORING, ED - Abnormal; Notable for the following components:   Glucose-Capillary 218 (*)    All other components within normal limits  SARS CORONAVIRUS 2 (TAT 6-24 HRS)  HCG, QUANTITATIVE, PREGNANCY  BLOOD GAS, VENOUS  LACTIC ACID, PLASMA  LACTIC ACID, PLASMA  BETA-HYDROXYBUTYRIC ACID   CBG MONITORING, ED    EKG None  Radiology Dg Chest 2 View  Result Date: 05/31/2019 CLINICAL DATA:  Shortness of breath, DKA, evaluate for pneumonia EXAM: CHEST - 2 VIEW COMPARISON:  Radiograph and CT 04/15/2018 FINDINGS: No consolidation, features of edema, pneumothorax, or effusion. Pulmonary vascularity is normally distributed. The cardiomediastinal contours are unremarkable. No acute osseous or soft tissue abnormality. IMPRESSION: No acute cardiopulmonary abnormality. Electronically Signed   By: Lovena Le M.D.   On: 05/31/2019 20:34    Procedures .Critical Care Performed by: Lucrezia Starch, MD Authorized by: Lucrezia Starch, MD   Critical care provider statement:    Critical care time (minutes):  45   Critical care was necessary to treat or prevent imminent or life-threatening deterioration of the following conditions: DKA.   Critical care was time spent personally by me on the following activities:  Discussions with consultants, evaluation of patient's response to treatment, examination of patient, ordering and performing treatments and interventions, ordering and review of laboratory studies, ordering and review of radiographic studies, pulse oximetry, re-evaluation of patient's condition, obtaining history from patient or surrogate and review of old charts   (including critical care time)  Medications Ordered in ED Medications  insulin regular, human (MYXREDLIN) 100 units/ 100 mL infusion (has no administration in time range)  sodium chloride 0.9 % bolus 1,000 mL (has no administration in time range)  dextrose 5 %-0.45 % sodium chloride infusion (has no administration in time range)  potassium chloride 10 mEq in 100 mL IVPB (has no administration in time range)     Initial Impression / Assessment and Plan / ED Course  I have reviewed the triage vital signs and the nursing notes.  Pertinent labs & imaging results that were available during my care of the patient  were reviewed by me and considered in my medical decision making (see chart for details).  Clinical Course as of May 30 2200  Tue May 31, 2019  2005 Reviewed triage notes, vitals, initial labs - likely dka, will go assess  patient   [RD]  2201 Reviewed remainder of labs, will admit for DKA  pH, Ven(!): 7.215 [RD]    Clinical Course User Index [RD] Lucrezia Starch, MD      33 year old lady presents to ER with nausea, vomiting, elevated blood sugars.  On exam she is actually quite well-appearing with stable vital signs except for mild tachypnea and mild tachycardia.  Soft abdomen, doubt acute abdominal process.  Chest x-ray negative.  Urine without definite infection, given she has no specific urinary symptoms, will defer treatment at this time.  Her sugar was only mildly elevated in the low 200s.  However lab work was concerning for diabetic ketoacidosis with low pH and low bicarb.  Started insulin infusion per DKA protocol.  Consulted hospitalist for admission.   Final Clinical Impressions(s) / ED Diagnoses   Final diagnoses:  Diabetic ketoacidosis without coma associated with type 1 diabetes mellitus [facility])    ED Discharge Orders    None       Lucrezia Starch, MD 05/31/19 2201

## 2019-05-31 NOTE — ED Triage Notes (Signed)
Body aches, heart racing, headache, high sugars for over week. Reports been outside painting a mural in heat and when can get her sugars off. Been trying to drink Gatorade. Took 9 units insulin around 9am today CBG 235. Didn't re-check sugar level.

## 2019-06-01 ENCOUNTER — Encounter (HOSPITAL_COMMUNITY): Payer: Self-pay

## 2019-06-01 LAB — BASIC METABOLIC PANEL
Anion gap: 12 (ref 5–15)
Anion gap: 8 (ref 5–15)
Anion gap: 8 (ref 5–15)
Anion gap: 5 (ref 5–15)
Anion gap: 6 (ref 5–15)
BUN: 9 mg/dL (ref 6–20)
BUN: 10 mg/dL (ref 6–20)
BUN: 10 mg/dL (ref 6–20)
BUN: 8 mg/dL (ref 6–20)
BUN: 9 mg/dL (ref 6–20)
CO2: 11 mmol/L — ABNORMAL LOW (ref 22–32)
CO2: 12 mmol/L — ABNORMAL LOW (ref 22–32)
CO2: 14 mmol/L — ABNORMAL LOW (ref 22–32)
CO2: 17 mmol/L — ABNORMAL LOW (ref 22–32)
CO2: 17 mmol/L — ABNORMAL LOW (ref 22–32)
Calcium: 8.3 mg/dL — ABNORMAL LOW (ref 8.9–10.3)
Calcium: 7.8 mg/dL — ABNORMAL LOW (ref 8.9–10.3)
Calcium: 8 mg/dL — ABNORMAL LOW (ref 8.9–10.3)
Calcium: 7.9 mg/dL — ABNORMAL LOW (ref 8.9–10.3)
Calcium: 8.3 mg/dL — ABNORMAL LOW (ref 8.9–10.3)
Chloride: 111 mmol/L (ref 98–111)
Chloride: 113 mmol/L — ABNORMAL HIGH (ref 98–111)
Chloride: 112 mmol/L — ABNORMAL HIGH (ref 98–111)
Chloride: 112 mmol/L — ABNORMAL HIGH (ref 98–111)
Chloride: 113 mmol/L — ABNORMAL HIGH (ref 98–111)
Creatinine, Ser: 0.68 mg/dL (ref 0.44–1.00)
Creatinine, Ser: 0.73 mg/dL (ref 0.44–1.00)
Creatinine, Ser: 0.68 mg/dL (ref 0.44–1.00)
Creatinine, Ser: 0.57 mg/dL (ref 0.44–1.00)
Creatinine, Ser: 0.59 mg/dL (ref 0.44–1.00)
GFR calc Af Amer: >60 mL/min (ref >60)
GFR calc Af Amer: >60 mL/min (ref >60)
GFR calc Af Amer: >60 mL/min (ref >60)
GFR calc Af Amer: >60 mL/min (ref >60)
GFR calc Af Amer: >60 mL/min (ref >60)
GFR calc non Af Amer: >60 mL/min (ref >60)
GFR calc non Af Amer: >60 mL/min (ref >60)
GFR calc non Af Amer: >60 mL/min (ref >60)
GFR calc non Af Amer: >60 mL/min (ref >60)
GFR calc non Af Amer: >60 mL/min (ref >60)
Glucose, Bld: 153 mg/dL — ABNORMAL HIGH (ref 70–99)
Glucose, Bld: 153 mg/dL — ABNORMAL HIGH (ref 70–99)
Glucose, Bld: 160 mg/dL — ABNORMAL HIGH (ref 70–99)
Glucose, Bld: 185 mg/dL — ABNORMAL HIGH (ref 70–99)
Glucose, Bld: 146 mg/dL — ABNORMAL HIGH (ref 70–99)
Potassium: 4.1 mmol/L (ref 3.5–5.1)
Potassium: 3.8 mmol/L (ref 3.5–5.1)
Potassium: 3.9 mmol/L (ref 3.5–5.1)
Potassium: 3.7 mmol/L (ref 3.5–5.1)
Potassium: 3.4 mmol/L — ABNORMAL LOW (ref 3.5–5.1)
Sodium: 134 mmol/L — ABNORMAL LOW (ref 135–145)
Sodium: 133 mmol/L — ABNORMAL LOW (ref 135–145)
Sodium: 134 mmol/L — ABNORMAL LOW (ref 135–145)
Sodium: 134 mmol/L — ABNORMAL LOW (ref 135–145)
Sodium: 136 mmol/L (ref 135–145)

## 2019-06-01 LAB — CBG MONITORING, ED
Glucose-Capillary: 155 mg/dL — ABNORMAL HIGH (ref 70–99)
Glucose-Capillary: 132 mg/dL — ABNORMAL HIGH (ref 70–99)
Glucose-Capillary: 138 mg/dL — ABNORMAL HIGH (ref 70–99)
Glucose-Capillary: 123 mg/dL — ABNORMAL HIGH (ref 70–99)
Glucose-Capillary: 142 mg/dL — ABNORMAL HIGH (ref 70–99)
Glucose-Capillary: 141 mg/dL — ABNORMAL HIGH (ref 70–99)
Glucose-Capillary: 151 mg/dL — ABNORMAL HIGH (ref 70–99)

## 2019-06-01 LAB — MRSA PCR SCREENING: MRSA by PCR: NEGATIVE

## 2019-06-01 LAB — GLUCOSE, CAPILLARY
Glucose-Capillary: 143 mg/dL — ABNORMAL HIGH (ref 70–99)
Glucose-Capillary: 160 mg/dL — ABNORMAL HIGH (ref 70–99)
Glucose-Capillary: 177 mg/dL — ABNORMAL HIGH (ref 70–99)
Glucose-Capillary: 171 mg/dL — ABNORMAL HIGH (ref 70–99)
Glucose-Capillary: 185 mg/dL — ABNORMAL HIGH (ref 70–99)
Glucose-Capillary: 181 mg/dL — ABNORMAL HIGH (ref 70–99)
Glucose-Capillary: 248 mg/dL — ABNORMAL HIGH (ref 70–99)
Glucose-Capillary: 181 mg/dL — ABNORMAL HIGH (ref 70–99)
Glucose-Capillary: 172 mg/dL — ABNORMAL HIGH (ref 70–99)
Glucose-Capillary: 152 mg/dL — ABNORMAL HIGH (ref 70–99)
Glucose-Capillary: 156 mg/dL — ABNORMAL HIGH (ref 70–99)
Glucose-Capillary: 203 mg/dL — ABNORMAL HIGH (ref 70–99)
Glucose-Capillary: 108 mg/dL — ABNORMAL HIGH (ref 70–99)
Glucose-Capillary: 103 mg/dL — ABNORMAL HIGH (ref 70–99)

## 2019-06-01 LAB — SARS CORONAVIRUS 2 (TAT 6-24 HRS): SARS Coronavirus 2: NEGATIVE

## 2019-06-01 MED ORDER — HYDROXYZINE HCL 25 MG PO TABS
25.0000 mg | ORAL_TABLET | Freq: Three times a day (TID) | ORAL | Status: DC | PRN
  Administered 2019-06-01 – 2019-06-03 (×4): 25 mg via ORAL
  Filled 2019-06-01 (×4): qty 1

## 2019-06-01 MED ORDER — POTASSIUM CHLORIDE CRYS ER 20 MEQ PO TBCR
40.0000 meq | EXTENDED_RELEASE_TABLET | Freq: Once | ORAL | Status: AC
  Administered 2019-06-01: 40 meq via ORAL
  Filled 2019-06-01: qty 2

## 2019-06-01 MED ORDER — ATORVASTATIN CALCIUM 10 MG PO TABS
10.0000 mg | ORAL_TABLET | Freq: Every day | ORAL | Status: DC
  Administered 2019-06-01 – 2019-06-03 (×3): 10 mg via ORAL
  Filled 2019-06-01 (×4): qty 1

## 2019-06-01 MED ORDER — SODIUM CHLORIDE 0.9 % IV BOLUS
1000.0000 mL | Freq: Once | INTRAVENOUS | Status: AC
  Administered 2019-06-01: 1000 mL via INTRAVENOUS

## 2019-06-01 MED ORDER — FERROUS SULFATE 325 (65 FE) MG PO TABS
325.0000 mg | ORAL_TABLET | Freq: Every day | ORAL | Status: DC
  Filled 2019-06-01 (×2): qty 1

## 2019-06-01 MED ORDER — POLYETHYLENE GLYCOL 3350 17 G PO PACK
17.0000 g | PACK | Freq: Two times a day (BID) | ORAL | Status: DC | PRN
  Administered 2019-06-01: 17 g via ORAL
  Filled 2019-06-01: qty 1

## 2019-06-01 MED ORDER — SERTRALINE HCL 50 MG PO TABS
50.0000 mg | ORAL_TABLET | Freq: Every day | ORAL | Status: DC
  Administered 2019-06-03: 50 mg via ORAL
  Filled 2019-06-01: qty 1

## 2019-06-01 MED ORDER — CHLORHEXIDINE GLUCONATE CLOTH 2 % EX PADS
6.0000 | MEDICATED_PAD | Freq: Every day | CUTANEOUS | Status: DC
  Administered 2019-06-01 – 2019-06-03 (×3): 6 via TOPICAL

## 2019-06-01 MED ORDER — METOCLOPRAMIDE HCL 5 MG PO TABS
5.0000 mg | ORAL_TABLET | Freq: Three times a day (TID) | ORAL | Status: DC
  Administered 2019-06-01 – 2019-06-03 (×5): 5 mg via ORAL
  Filled 2019-06-01 (×5): qty 1

## 2019-06-01 NOTE — ED Notes (Signed)
Made aware by another RN that IV had infiltrated and infusions had been stopped. Asked an ultrasound IV trained RN to start another line.

## 2019-06-01 NOTE — ED Notes (Signed)
Patient provided diet gingerale, water, and chicken broth to drink.

## 2019-06-01 NOTE — Progress Notes (Signed)
Inpatient Diabetes Program Recommendations  AACE/ADA: New Consensus Statement on Inpatient Glycemic Control (2015)  Target Ranges:  Prepandial:   less than 140 mg/dL      Peak postprandial:   less than 180 mg/dL (1-2 hours)      Critically ill patients:  140 - 180 mg/dL   Lab Results  Component Value Date   GLUCAP 177 (H) 06/01/2019   HGBA1C 8.6 (A) 04/20/2019    Review of Glycemic Control  Diabetes history: DM type 1 , Sees, Dr. Kelton Pillar at [facility] Endocrinology Outpatient Diabetes medications: Basaglar 16 units Daily, Humalog 7 units tid with meals Current orders for Inpatient glycemic control: IV insulin gtt per DKA protocol  A1c 8.6% on 9/23 at Endocrinology visit (higher than usual) insulin adjustments made. patient reports better glycemic control.  Inpatient Diabetes Program Recommendations:    On the last BMET the CO2 was 14. Remain on IV insulin until acidosis clears. Wait until next BMET.  Spoke with patient at bedside. Pt reports when working in the heat, she has a difficult time controlling her glucose. Pt was working outside with a mural. Patient has completed this project. Patient was drinking Gatorade zero more specifically. Patient sees Geryl Rankins, NP at [facility] as a PCP. And is able to go to the Endocrinologist office whenever she needs. Patient is able to get medication and DM supplies from patient assistance program outpatient that she renews every year.   Last time patent was in DKA was in September last year 2019. Patient knows about sick day guidelines. No needs identified at this time.  Thanks,  Tama Headings RN, MSN, BC-ADM Inpatient Diabetes Coordinator Team Pager [phone] (8a-5p)

## 2019-06-01 NOTE — ED Notes (Signed)
Phlebotomy paged for collection of blood.

## 2019-06-01 NOTE — Progress Notes (Signed)
PROGRESS NOTE    [name]  [id number] DOB: [birth year] DOA: 05/31/2019 PCP: Gildardo Pounds, NP    Brief Narrative:   33 year old lady with prior history of type 1 diabetes mellitus presents with nausea and vomiting for 2 days and on arrival she was found to be hyperglycemic.  Her bicarb was low and she was admitted for evaluation of DKA.  Patient reports she denies missing any doses and has been working in the warm weather for over a couple of weeks.  Assessment & Plan:   Principal Problem:   DKA (diabetic ketoacidoses) ([place])   DKA: Improving, AG is closed.  Bicarb is still 14 we will continue with IV insulin and fluids till the bicarb normalizes.  Advance diet as tolerated.  Replace potassium as needed and keep it greater than 4.  Check BMP every 6 hours.    Hyponatremia possibly from hyperglycemia.   DVT prophylaxis: Lovenox Code Status: Full code Family Communication: None at bedside Disposition Plan: Pending clinical improvement, possible discharge in the next 24 hours.  Consultants:   None   Procedures: None   Antimicrobials: None   Subjective: Patient denies any nausea vomiting or abdominal pain  Objective: Vitals:   06/01/19 0430 06/01/19 0530 06/01/19 0600 06/01/19 0730  BP: 135/82 131/79 (!) 102/55 126/83  Pulse: (!) 104 (!) 106 93 97  Resp: 16 18 16 16   Temp:      TempSrc:      SpO2: 100% 100% 100% 100%  Weight:      Height:        Intake/Output Summary (Last 24 hours) at 06/01/2019 0856 Last data filed at 05/31/2019 2339 Gross per 24 hour  Intake 1100 ml  Output -  Net 1100 ml   Filed Weights   05/31/19 2247  Weight: 72.6 kg    Examination:  General exam: Appears calm and comfortable  Respiratory system: Clear to auscultation. Respiratory effort normal. Cardiovascular system: S1 & S2 heard, RRR. No JVD, murmurs, rubs, gallops or clicks. No pedal edema. Gastrointestinal system: Abdomen is nondistended, soft and nontender.  No organomegaly or masses felt. Normal bowel sounds heard. Central nervous system: Alert and oriented. No focal neurological deficits. Extremities: Symmetric 5 x 5 power. Skin: No rashes, lesions or ulcers Psychiatry: Judgement and insight appear normal. Mood & affect appropriate.     Data Reviewed: I have personally reviewed following labs and imaging studies  CBC: Recent Labs  Lab 05/31/19 1647  WBC 6.4  HGB 15.4*  HCT 50.7*  MCV 89.7  PLT [id number]   Basic Metabolic Panel: Recent Labs  Lab 05/31/19 1647 05/31/19 2244 06/01/19 0333 06/01/19 0638  NA 130* 130* 134* 133*  K 4.8 5.2* 4.1 3.8  CL 105 108 111 113*  CO2 9* 10* 11* 12*  GLUCOSE 228* 223* 153* 153*  BUN 15 13 9 10   CREATININE 0.84 0.81 0.68 0.73  CALCIUM 9.2 8.3* 8.3* 7.8*   GFR: Estimated Creatinine Clearance: 103 mL/min (by C-G formula based on SCr of 0.73 mg/dL). Liver Function Tests: No results for input(s): AST, ALT, ALKPHOS, BILITOT, PROT, ALBUMIN in the last 168 hours. No results for input(s): LIPASE, AMYLASE in the last 168 hours. No results for input(s): AMMONIA in the last 168 hours. Coagulation Profile: No results for input(s): INR, PROTIME in the last 168 hours. Cardiac Enzymes: No results for input(s): CKTOTAL, CKMB, CKMBINDEX, TROPONINI in the last 168 hours. BNP (last 3 results) No results for input(s): PROBNP in the last 8760  hours. HbA1C: No results for input(s): HGBA1C in the last 72 hours. CBG: Recent Labs  Lab 06/01/19 0318 06/01/19 0444 06/01/19 0534 06/01/19 0653 06/01/19 0800  GLUCAP 132* 123* 142* 141* 151*   Lipid Profile: No results for input(s): CHOL, HDL, LDLCALC, TRIG, CHOLHDL, LDLDIRECT in the last 72 hours. Thyroid Function Tests: No results for input(s): TSH, T4TOTAL, FREET4, T3FREE, THYROIDAB in the last 72 hours. Anemia Panel: No results for input(s): VITAMINB12, FOLATE, FERRITIN, TIBC, IRON, RETICCTPCT in the last 72 hours. Sepsis Labs: Recent Labs  Lab  05/31/19 2127 05/31/19 2250  LATICACIDVEN 1.0 1.0    Recent Results (from the past 240 hour(s))  SARS CORONAVIRUS 2 (TAT 6-24 HRS) Nasopharyngeal Nasopharyngeal Swab     Status: None   Collection Time: 05/31/19 10:44 PM   Specimen: Nasopharyngeal Swab  Result Value Ref Range Status   SARS Coronavirus 2 NEGATIVE NEGATIVE Final    Comment: (NOTE) SARS-CoV-2 target nucleic acids are NOT DETECTED. The SARS-CoV-2 RNA is generally detectable in upper and lower respiratory specimens during the acute phase of infection. Negative results do not preclude SARS-CoV-2 infection, do not rule out co-infections with other pathogens, and should not be used as the sole basis for treatment or other patient management decisions. Negative results must be combined with clinical observations, patient history, and epidemiological information. The expected result is Negative. Fact Sheet for Patients: SugarRoll.be Fact Sheet for Healthcare Providers: https://www.woods-mathews.com/ This test is not yet approved or cleared by the [place] FDA and  has been authorized for detection and/or diagnosis of SARS-CoV-2 by FDA under an Emergency Use Authorization (EUA). This EUA will remain  in effect (meaning this test can be used) for the duration of the COVID-19 declaration under Section 56 4(b)(1) of the Act, 21 U.S.C. section 360bbb-3(b)(1), unless the authorization is terminated or revoked sooner. Performed at [facility], [place] [address] [place], [place] [address]          Radiology Studies: Dg Chest 2 View  Result Date: 05/31/2019 CLINICAL DATA:  Shortness of breath, DKA, evaluate for pneumonia EXAM: CHEST - 2 VIEW COMPARISON:  Radiograph and CT 04/15/2018 FINDINGS: No consolidation, features of edema, pneumothorax, or effusion. Pulmonary vascularity is normally distributed. The cardiomediastinal contours are unremarkable. No acute osseous or soft  tissue abnormality. IMPRESSION: No acute cardiopulmonary abnormality. Electronically Signed   By: Lovena Le M.D.   On: 05/31/2019 20:34        Scheduled Meds: . atorvastatin  10 mg Oral Daily  . enoxaparin (LOVENOX) injection  40 mg Subcutaneous QHS  . ferrous sulfate  325 mg Oral Q breakfast  . metoCLOPramide  5 mg Oral TID AC  . sertraline  50 mg Oral Daily   Continuous Infusions: . sodium chloride Stopped (06/01/19 0814)  . dextrose 5 % and 0.45% NaCl 100 mL/hr at 06/01/19 0430  . insulin 0.5 Units/hr (06/01/19 0655)     LOS: 1 day        Hosie Poisson, MD [facility] Pager [phone]  If 7PM-7AM, please contact night-coverage [email or web address] Password [facility] 06/01/2019, 8:56 AM

## 2019-06-01 NOTE — ED Notes (Signed)
Phlebotomy paged to collect BMP.

## 2019-06-02 LAB — BASIC METABOLIC PANEL WITH GFR
Anion gap: 5 (ref 5–15)
Anion gap: 5 (ref 5–15)
BUN: 8 mg/dL (ref 6–20)
BUN: 7 mg/dL (ref 6–20)
CO2: 17 mmol/L — ABNORMAL LOW (ref 22–32)
CO2: 18 mmol/L — ABNORMAL LOW (ref 22–32)
Calcium: 7.6 mg/dL — ABNORMAL LOW (ref 8.9–10.3)
Calcium: 7.5 mg/dL — ABNORMAL LOW (ref 8.9–10.3)
Chloride: 114 mmol/L — ABNORMAL HIGH (ref 98–111)
Chloride: 113 mmol/L — ABNORMAL HIGH (ref 98–111)
Creatinine, Ser: 0.53 mg/dL (ref 0.44–1.00)
Creatinine, Ser: 0.58 mg/dL (ref 0.44–1.00)
GFR calc Af Amer: >60 mL/min (ref >60)
GFR calc Af Amer: >60 mL/min (ref >60)
GFR calc non Af Amer: >60 mL/min (ref >60)
GFR calc non Af Amer: >60 mL/min (ref >60)
Glucose, Bld: 159 mg/dL — ABNORMAL HIGH (ref 70–99)
Glucose, Bld: 214 mg/dL — ABNORMAL HIGH (ref 70–99)
Potassium: 3.6 mmol/L (ref 3.5–5.1)
Potassium: 3.4 mmol/L — ABNORMAL LOW (ref 3.5–5.1)
Sodium: 136 mmol/L (ref 135–145)
Sodium: 136 mmol/L (ref 135–145)

## 2019-06-02 LAB — BASIC METABOLIC PANEL
Anion gap: 7 (ref 5–15)
BUN: 5 mg/dL — ABNORMAL LOW (ref 6–20)
CO2: 17 mmol/L — ABNORMAL LOW (ref 22–32)
Calcium: 7.7 mg/dL — ABNORMAL LOW (ref 8.9–10.3)
Chloride: 113 mmol/L — ABNORMAL HIGH (ref 98–111)
Creatinine, Ser: 0.54 mg/dL (ref 0.44–1.00)
GFR calc Af Amer: >60 mL/min (ref >60)
GFR calc non Af Amer: >60 mL/min (ref >60)
Glucose, Bld: 253 mg/dL — ABNORMAL HIGH (ref 70–99)
Potassium: 4.1 mmol/L (ref 3.5–5.1)
Sodium: 137 mmol/L (ref 135–145)

## 2019-06-02 LAB — CBC WITH DIFFERENTIAL/PLATELET
Abs Immature Granulocytes: 0.01 10*3/uL (ref 0.00–0.07)
Basophils Absolute: 0 10*3/uL (ref 0.0–0.1)
Basophils Relative: 1 %
Eosinophils Absolute: 0.1 10*3/uL (ref 0.0–0.5)
Eosinophils Relative: 2 %
HCT: 33.3 % — ABNORMAL LOW (ref 36.0–46.0)
Hemoglobin: 10.5 g/dL — ABNORMAL LOW (ref 12.0–15.0)
Immature Granulocytes: 0 %
Lymphocytes Relative: 36 %
Lymphs Abs: 1.4 10*3/uL (ref 0.7–4.0)
MCH: 27.6 pg (ref 26.0–34.0)
MCHC: 31.5 g/dL (ref 30.0–36.0)
MCV: 87.4 fL (ref 80.0–100.0)
Monocytes Absolute: 0.5 10*3/uL (ref 0.1–1.0)
Monocytes Relative: 13 %
Neutro Abs: 1.9 10*3/uL (ref 1.7–7.7)
Neutrophils Relative %: 48 %
Platelets: 236 10*3/uL (ref 150–400)
RBC: 3.81 MIL/uL — ABNORMAL LOW (ref 3.87–5.11)
RDW: 13.6 % (ref 11.5–15.5)
WBC: 3.9 10*3/uL — ABNORMAL LOW (ref 4.0–10.5)
nRBC: 0 % (ref 0.0–0.2)

## 2019-06-02 LAB — GLUCOSE, CAPILLARY
Glucose-Capillary: 111 mg/dL — ABNORMAL HIGH (ref 70–99)
Glucose-Capillary: 118 mg/dL — ABNORMAL HIGH (ref 70–99)
Glucose-Capillary: 130 mg/dL — ABNORMAL HIGH (ref 70–99)
Glucose-Capillary: 160 mg/dL — ABNORMAL HIGH (ref 70–99)
Glucose-Capillary: 168 mg/dL — ABNORMAL HIGH (ref 70–99)
Glucose-Capillary: 202 mg/dL — ABNORMAL HIGH (ref 70–99)
Glucose-Capillary: 207 mg/dL — ABNORMAL HIGH (ref 70–99)
Glucose-Capillary: 216 mg/dL — ABNORMAL HIGH (ref 70–99)
Glucose-Capillary: 220 mg/dL — ABNORMAL HIGH (ref 70–99)
Glucose-Capillary: 231 mg/dL — ABNORMAL HIGH (ref 70–99)
Glucose-Capillary: 269 mg/dL — ABNORMAL HIGH (ref 70–99)
Glucose-Capillary: 56 mg/dL — ABNORMAL LOW (ref 70–99)
Glucose-Capillary: 81 mg/dL (ref 70–99)

## 2019-06-02 LAB — HEMOGLOBIN A1C
Hgb A1c MFr Bld: 9.5 % — ABNORMAL HIGH (ref 4.8–5.6)
Mean Plasma Glucose: 225.95 mg/dL

## 2019-06-02 MED ORDER — DEXTROSE 10 % IV SOLN
INTRAVENOUS | Status: DC
  Administered 2019-06-02: 01:00:00 via INTRAVENOUS

## 2019-06-02 MED ORDER — INSULIN GLARGINE 100 UNIT/ML ~~LOC~~ SOLN
16.0000 [IU] | Freq: Every day | SUBCUTANEOUS | Status: DC
  Administered 2019-06-02 – 2019-06-03 (×2): 16 [IU] via SUBCUTANEOUS
  Filled 2019-06-02 (×2): qty 0.16

## 2019-06-02 MED ORDER — INSULIN ASPART 100 UNIT/ML ~~LOC~~ SOLN
0.0000 [IU] | Freq: Every day | SUBCUTANEOUS | Status: DC
  Administered 2019-06-02: 2 [IU] via SUBCUTANEOUS

## 2019-06-02 MED ORDER — POTASSIUM CHLORIDE CRYS ER 20 MEQ PO TBCR
40.0000 meq | EXTENDED_RELEASE_TABLET | Freq: Once | ORAL | Status: AC
  Administered 2019-06-02: 40 meq via ORAL
  Filled 2019-06-02: qty 2

## 2019-06-02 MED ORDER — INSULIN ASPART 100 UNIT/ML ~~LOC~~ SOLN: 0.0000 [IU] | Freq: Three times a day (TID) | SUBCUTANEOUS | Status: DC

## 2019-06-02 MED ORDER — INSULIN ASPART 100 UNIT/ML ~~LOC~~ SOLN
0.0000 [IU] | Freq: Three times a day (TID) | SUBCUTANEOUS | Status: DC
  Administered 2019-06-02: 3 [IU] via SUBCUTANEOUS
  Administered 2019-06-02 – 2019-06-03 (×2): 5 [IU] via SUBCUTANEOUS
  Administered 2019-06-03: 2 [IU] via SUBCUTANEOUS

## 2019-06-02 MED ORDER — SODIUM CHLORIDE 0.9 % IV BOLUS
1000.0000 mL | Freq: Once | INTRAVENOUS | Status: AC
  Administered 2019-06-02: 1000 mL via INTRAVENOUS

## 2019-06-02 NOTE — Progress Notes (Signed)
PROGRESS NOTE    [name]  [id number] DOB: [birth year] DOA: 05/31/2019 PCP: Gildardo Pounds, NP    Brief Narrative:   33 year old lady with prior history of type 1 diabetes mellitus presents with nausea and vomiting for 2 days and on arrival she was found to be hyperglycemic.  Her bicarb was low and she was admitted for evaluation of DKA.  Patient reports she denies missing any doses and has been working in the warm weather for over a couple of weeks. Her CBG'S have improved and have been less than 250's. Transitioned to subcutaneous lantus this morning and repeat BMP this afternoon.   Assessment & Plan:   Principal Problem:   DKA (diabetic ketoacidoses) ([place])   DKA: Improving, AG is closed but patient is not back to baseline yet.  Bicarb is at 18, better than on admission Pt reports feeling weak, reports having palpitations. Recommend to continue with IV fluids NS AT 100ML/HR for another 24 hours and repeat BMP in am to make sure bicarbonate is wnl.   Advance diet as tolerated.  Replace potassium as needed and keep it greater than 4.   Changed IV insulin to sq  lantus and novolog SSI.    Hyponatremia possibly from hyperglycemia.   Hyperlipidemia:  Continue with lipitor.   Hypokalemia:  Replaced. Repeat this afternoon.    Depression:  continue with zoloft.    DVT prophylaxis: Lovenox Code Status: Full code Family Communication: None at bedside Disposition Plan:pending clinical improvement. Possible d.c in the morning when bicarb normalizes.   Consultants:   None   Procedures: None   Antimicrobials: None   Subjective: Pt reports feeling weak, but denies any nausea, vomiting or abdominal pain.  Objective: Vitals:   06/02/19 0500 06/02/19 0600 06/02/19 0700 06/02/19 0800  BP: (!) 128/99 120/70 137/77   Pulse: 74 84 96   Resp: 19 17 20    Temp:    98.2 F (36.8 C)  TempSrc:    Oral  SpO2: 100% 100% 100%   Weight:      Height:         Intake/Output Summary (Last 24 hours) at 06/02/2019 1008 Last data filed at 06/02/2019 0600 Gross per 24 hour  Intake 3581.59 ml  Output 2150 ml  Net 1431.59 ml   Filed Weights   05/31/19 2247 06/01/19 0913  Weight: 72.6 kg 72.4 kg    Examination:  General exam: Well developed, not in any kind of distress Respiratory system: Clear to auscultation bilaterally no wheezing or rhonchi Cardiovascular system: S1-S2 heard, tachycardic, no JVD or murmurs  gastrointestinal system: Abdomen is soft, nontender, nondistended, normal bowel sounds Central nervous system: Alert and oriented, no focal Extremities: No pedal edema, cyanosis or clubbing Skin: No rashes or ulcers seen. Psychiatry: Mood and affect appropriate    Data Reviewed: I have personally reviewed following labs and imaging studies  CBC: Recent Labs  Lab 05/31/19 1647  WBC 6.4  HGB 15.4*  HCT 50.7*  MCV 89.7  PLT [id number]   Basic Metabolic Panel: Recent Labs  Lab 06/01/19 1015 06/01/19 1406 06/01/19 2150 06/02/19 0203 06/02/19 0600  NA 134* 134* 136 136 136  K 3.9 3.7 3.4* 3.6 3.4*  CL 112* 112* 113* 114* 113*  CO2 14* 17* 17* 17* 18*  GLUCOSE 160* 185* 146* 159* 214*  BUN 10 8 9 8 7   CREATININE 0.68 0.57 0.59 0.53 0.58  CALCIUM 8.0* 7.9* 8.3* 7.6* 7.5*   GFR: Estimated Creatinine Clearance: 102.8 mL/min (  by C-G formula based on SCr of 0.58 mg/dL). Liver Function Tests: No results for input(s): AST, ALT, ALKPHOS, BILITOT, PROT, ALBUMIN in the last 168 hours. No results for input(s): LIPASE, AMYLASE in the last 168 hours. No results for input(s): AMMONIA in the last 168 hours. Coagulation Profile: No results for input(s): INR, PROTIME in the last 168 hours. Cardiac Enzymes: No results for input(s): CKTOTAL, CKMB, CKMBINDEX, TROPONINI in the last 168 hours. BNP (last 3 results) No results for input(s): PROBNP in the last 8760 hours. HbA1C: No results for input(s): HGBA1C in the last 72 hours. CBG: Recent  Labs  Lab 06/02/19 0600 06/02/19 0701 06/02/19 0757 06/02/19 0859 06/02/19 0921  GLUCAP 202* 160* 118* 56* 81   Lipid Profile: No results for input(s): CHOL, HDL, LDLCALC, TRIG, CHOLHDL, LDLDIRECT in the last 72 hours. Thyroid Function Tests: No results for input(s): TSH, T4TOTAL, FREET4, T3FREE, THYROIDAB in the last 72 hours. Anemia Panel: No results for input(s): VITAMINB12, FOLATE, FERRITIN, TIBC, IRON, RETICCTPCT in the last 72 hours. Sepsis Labs: Recent Labs  Lab 05/31/19 2127 05/31/19 2250  LATICACIDVEN 1.0 1.0    Recent Results (from the past 240 hour(s))  SARS CORONAVIRUS 2 (TAT 6-24 HRS) Nasopharyngeal Nasopharyngeal Swab     Status: None   Collection Time: 05/31/19 10:44 PM   Specimen: Nasopharyngeal Swab  Result Value Ref Range Status   SARS Coronavirus 2 NEGATIVE NEGATIVE Final    Comment: (NOTE) SARS-CoV-2 target nucleic acids are NOT DETECTED. The SARS-CoV-2 RNA is generally detectable in upper and lower respiratory specimens during the acute phase of infection. Negative results do not preclude SARS-CoV-2 infection, do not rule out co-infections with other pathogens, and should not be used as the sole basis for treatment or other patient management decisions. Negative results must be combined with clinical observations, patient history, and epidemiological information. The expected result is Negative. Fact Sheet for Patients: SugarRoll.be Fact Sheet for Healthcare Providers: https://www.woods-mathews.com/ This test is not yet approved or cleared by the [place] FDA and  has been authorized for detection and/or diagnosis of SARS-CoV-2 by FDA under an Emergency Use Authorization (EUA). This EUA will remain  in effect (meaning this test can be used) for the duration of the COVID-19 declaration under Section 56 4(b)(1) of the Act, 21 U.S.C. section 360bbb-3(b)(1), unless the authorization is terminated or  revoked sooner. Performed at [facility], [place] [address], [place], [place] [address]   MRSA PCR Screening     Status: None   Collection Time: 06/01/19  9:18 AM   Specimen: Nasal Mucosa; Nasopharyngeal  Result Value Ref Range Status   MRSA by PCR NEGATIVE NEGATIVE Final    Comment:        The GeneXpert MRSA Assay (FDA approved for NASAL specimens only), is one component of a comprehensive MRSA colonization surveillance program. It is not intended to diagnose MRSA infection nor to guide or monitor treatment for MRSA infections. Performed at [facility], [place] [address], [place], [place] [address]          Radiology Studies: Dg Chest 2 View  Result Date: 05/31/2019 CLINICAL DATA:  Shortness of breath, DKA, evaluate for pneumonia EXAM: CHEST - 2 VIEW COMPARISON:  Radiograph and CT 04/15/2018 FINDINGS: No consolidation, features of edema, pneumothorax, or effusion. Pulmonary vascularity is normally distributed. The cardiomediastinal contours are unremarkable. No acute osseous or soft tissue abnormality. IMPRESSION: No acute cardiopulmonary abnormality. Electronically Signed   By: Elwin Sleight.D.  On: 05/31/2019 20:34        Scheduled Meds: . atorvastatin  10 mg Oral Daily  . Chlorhexidine Gluconate Cloth  6 each Topical Daily  . enoxaparin (LOVENOX) injection  40 mg Subcutaneous QHS  . ferrous sulfate  325 mg Oral Q breakfast  . insulin aspart  0-5 Units Subcutaneous QHS  . insulin aspart  0-9 Units Subcutaneous TID WC  . insulin glargine  16 Units Subcutaneous Daily  . metoCLOPramide  5 mg Oral TID AC  . sertraline  50 mg Oral Daily   Continuous Infusions: . sodium chloride 100 mL/hr at 06/02/19 0816     LOS: 2 days        Hosie Poisson, MD [facility] Pager [phone]  If 7PM-7AM, please contact night-coverage [email or web address] Password [email or web address] 06/02/2019, 10:08 AM

## 2019-06-03 DIAGNOSIS — F419 Anxiety disorder, unspecified: Secondary | ICD-10-CM

## 2019-06-03 DIAGNOSIS — F329 Major depressive disorder, single episode, unspecified: Secondary | ICD-10-CM

## 2019-06-03 LAB — BASIC METABOLIC PANEL
Anion gap: 7 (ref 5–15)
BUN: 8 mg/dL (ref 6–20)
CO2: 20 mmol/L — ABNORMAL LOW (ref 22–32)
Calcium: 8.1 mg/dL — ABNORMAL LOW (ref 8.9–10.3)
Chloride: 111 mmol/L (ref 98–111)
Creatinine, Ser: 0.53 mg/dL (ref 0.44–1.00)
GFR calc Af Amer: >60 mL/min (ref >60)
GFR calc non Af Amer: >60 mL/min (ref >60)
Glucose, Bld: 190 mg/dL — ABNORMAL HIGH (ref 70–99)
Potassium: 3.6 mmol/L (ref 3.5–5.1)
Sodium: 138 mmol/L (ref 135–145)

## 2019-06-03 LAB — GLUCOSE, CAPILLARY
Glucose-Capillary: 173 mg/dL — ABNORMAL HIGH (ref 70–99)
Glucose-Capillary: 296 mg/dL — ABNORMAL HIGH (ref 70–99)

## 2019-06-03 MED ORDER — INSULIN ASPART 100 UNIT/ML ~~LOC~~ SOLN
3.0000 [IU] | Freq: Three times a day (TID) | SUBCUTANEOUS | Status: DC
  Administered 2019-06-03: 3 [IU] via SUBCUTANEOUS

## 2019-06-03 MED ORDER — HYDROXYZINE HCL 25 MG PO TABS: 25.0000 mg | ORAL_TABLET | Freq: Three times a day (TID) | ORAL | 0 refills | Status: DC | PRN

## 2019-06-03 MED ORDER — INSULIN ASPART 100 UNIT/ML ~~LOC~~ SOLN
5.0000 [IU] | Freq: Three times a day (TID) | SUBCUTANEOUS | Status: DC
  Administered 2019-06-03: 5 [IU] via SUBCUTANEOUS

## 2019-06-03 MED ORDER — MORPHINE SULFATE (PF) 2 MG/ML IV SOLN: 2.0000 mg | Freq: Once | INTRAVENOUS | Status: DC

## 2019-06-03 MED ORDER — MORPHINE SULFATE (PF) 2 MG/ML IV SOLN
2.0000 mg | Freq: Once | INTRAVENOUS | Status: AC
  Administered 2019-06-03: 2 mg via INTRAVENOUS
  Filled 2019-06-03: qty 1

## 2019-06-03 MED FILL — hydrOXYzine HCL 25 MG TABS: 25 | 20 days supply | Qty: 60 | Fill #0

## 2019-06-03 NOTE — Progress Notes (Signed)
Inpatient Diabetes Program Recommendations  AACE/ADA: New Consensus Statement on Inpatient Glycemic Control (2015)  Target Ranges:  Prepandial:   less than 140 mg/dL      Peak postprandial:   less than 180 mg/dL (1-2 hours)      Critically ill patients:  140 - 180 mg/dL   Lab Results  Component Value Date   GLUCAP 173 (H) 06/03/2019   HGBA1C 9.5 (H) 06/02/2019    Review of Glycemic Control  Results for Joy, [name] (MRN [id number]) as of 06/03/2019 10:23  Ref. Range 06/02/2019 12:09 06/02/2019 17:28 06/02/2019 22:03 06/03/2019 07:44  Glucose-Capillary Latest Ref Range: 70 - 99 mg/dL 231 (H) Novolog 3units 269 (H) Novolog 5units 220 (H) Novolog 2units 173 (H) No coverage noted    Diabetes history: DM2 Outpatient Diabetes medications: Basaglar 16 units QD; Humalog 7 units TID with meals Current orders for Inpatient glycemic control: Lantus 16 units QD; Novolog 0-9 TID with meals; Novolog 0-5 units QHS  Inpatient Diabetes Program Recommendations:     -Please consider increasing meal coverage to Novolog 5 units with meals.  Patient is on Novolog 7 units with meals at  home.   Thanks, Geoffry Paradise, RN, BSN Diabetes Coordinator [phone] (8a-5p)

## 2019-06-03 NOTE — Progress Notes (Signed)
Nsg Discharge Note  Admit Date:  05/31/2019 Discharge date: 06/03/2019   Lenice Llamas to be D/C'd Home per MD order.  AVS completed.  Copy for chart, and copy for patient signed, and dated. Patient/caregiver able to verbalize understanding.  Discharge Medication: Allergies as of 06/03/2019      Reactions   Lexapro [escitalopram Oxalate]    NUMBNESS      Medication List    STOP taking these medications   ferrous sulfate 325 (65 FE) MG EC tablet     TAKE these medications   atorvastatin 10 MG tablet Commonly known as: LIPITOR Take 1 tablet (10 mg total) by mouth daily.   Basaglar KwikPen 100 UNIT/ML Sopn Inject 0.16 mLs (16 Units total) into the skin daily. INJECT 16 UNITS UNDER THE SKIN ONCE DAILY.   blood glucose meter kit and supplies Kit Dispense based on patient and insurance preference. Use up to four times daily as directed. (FOR ICD-9 250.00, 250.01).   CULTURELLE PROBIOTICS PO Take by mouth.   glucose blood test strip Commonly known as: True Metrix Blood Glucose Test Use as instructed   hydrOXYzine 25 MG tablet Commonly known as: ATARAX/VISTARIL Take 1 tablet (25 mg total) by mouth 3 (three) times daily as needed for anxiety.   insulin lispro 100 UNIT/ML injection Commonly known as: HumaLOG Inject 0.07 mLs (7 Units total) into the skin 3 (three) times daily before meals. Max daily 35 units   INSULIN SYRINGE .5CC/29G 29G X 1/2" 0.5 ML Misc 1 each by Does not apply route 4 (four) times daily - after meals and at bedtime.   Lancet Device Misc 1 each by Does not apply route 4 (four) times daily - after meals and at bedtime.   metoCLOPramide 5 MG tablet Commonly known as: Reglan Take 1 tablet (5 mg total) by mouth 3 (three) times daily before meals.   polyethylene glycol powder 17 GM/SCOOP powder Commonly known as: GLYCOLAX/MIRALAX Take 17 g by mouth 2 (two) times daily as needed.   sertraline 50 MG tablet Commonly known as: ZOLOFT Take 1 tablet (50  mg total) by mouth daily.   True Metrix Air Glucose Meter Devi 1 each by Does not apply route 4 (four) times daily -  with meals and at bedtime.   True Metrix Meter w/Device Kit Use as instructed. Monitor blood glucose levels 4 times per day   vitamin C 100 MG tablet Take 100 mg by mouth daily.       Discharge Assessment: Vitals:   06/03/19 0809 06/03/19 1139  BP: 119/79 129/79  Pulse: 88 (!) 106  Resp: 17 18  Temp: 98.5 F (36.9 C) 98.8 F (37.1 C)  SpO2: 100% 100%   Skin clean, dry and intact without evidence of skin break down, no evidence of skin tears noted. IV catheter discontinued intact. Site without signs and symptoms of complications - no redness or edema noted at insertion site, patient denies c/o pain - only slight tenderness at site.  Dressing with slight pressure applied.  D/c Instructions-Education: Discharge instructions given to patient/family with verbalized understanding. D/c education completed with patient/family including follow up instructions, medication list, d/c activities limitations if indicated, with other d/c instructions as indicated by MD - patient able to verbalize understanding, all questions fully answered. Patient instructed to return to ED, call 911, or call MD for any changes in condition.  Patient escorted via [place], and D/C home via private auto.  Eustace Pen, RN 06/03/2019 2:00 PM

## 2019-06-04 NOTE — Discharge Summary (Signed)
Physician Discharge Summary  [name] [id number] DOB: 08-28-1985 DOA: 05/31/2019  PCP: Gildardo Pounds, NP  Admit date: 05/31/2019 Discharge date: 06/03/2019  Admitted From: Home.  Disposition:  Home.   Recommendations for Outpatient Follow-up:  1. Follow up with PCP in 1-2 weeks 2. Please obtain BMP/CBC in one week Please follow up with your endocrinology as recommended.   Discharge Condition: stable.  CODE STATUS: full code.  Diet recommendation: Heart Healthy / Carb Modified   Brief/Interim Summary:  33 year old lady with prior history of type 1 diabetes mellitus presents with nausea and vomiting for 2 days and on arrival she was found to be hyperglycemic.  Her bicarb was low and she was admitted for evaluation of DKA.  Patient reports she denies missing any doses and has been working in the warm weather for over a couple of weeks. Her CBG'S have improved and have been less than 250's. Transitioned to subcutaneous lantus this morning and repeat BMP this afternoon show improvement in the bicarbonate.   Discharge Diagnoses:  Principal Problem:   DKA (diabetic ketoacidoses) ([place])  DKA: Resolved. AG is closed and bicarb is 20.  Advanced diet as tolerated.  Replaced potassium.  Changed IV insulin to sq  lantus and resume home dose humalog on discharge and recommended to follow up with endocrinology outpatient.    Hyponatremia possibly from hyperglycemia.   Hyperlipidemia:  Continue with lipitor.   Hypokalemia:  Replaced.    Depression:  continue with zoloft  Discharge Instructions  Discharge Instructions    Diet - low sodium heart healthy   Complete by: As directed    Discharge instructions   Complete by: As directed    Follow up with your endocrinologist in one month.     Allergies as of 06/03/2019      Reactions   Lexapro [escitalopram Oxalate]    NUMBNESS      Medication List    STOP taking these medications   ferrous sulfate 325 (65  FE) MG EC tablet     TAKE these medications   atorvastatin 10 MG tablet Commonly known as: LIPITOR Take 1 tablet (10 mg total) by mouth daily.   Basaglar KwikPen 100 UNIT/ML Sopn Inject 0.16 mLs (16 Units total) into the skin daily. INJECT 16 UNITS UNDER THE SKIN ONCE DAILY.   blood glucose meter kit and supplies Kit Dispense based on patient and insurance preference. Use up to four times daily as directed. (FOR ICD-9 250.00, 250.01).   CULTURELLE PROBIOTICS PO Take by mouth.   glucose blood test strip Commonly known as: True Metrix Blood Glucose Test Use as instructed   hydrOXYzine 25 MG tablet Commonly known as: ATARAX/VISTARIL Take 1 tablet (25 mg total) by mouth 3 (three) times daily as needed for anxiety.   insulin lispro 100 UNIT/ML injection Commonly known as: HumaLOG Inject 0.07 mLs (7 Units total) into the skin 3 (three) times daily before meals. Max daily 35 units   INSULIN SYRINGE .5CC/29G 29G X 1/2" 0.5 ML Misc 1 each by Does not apply route 4 (four) times daily - after meals and at bedtime.   Lancet Device Misc 1 each by Does not apply route 4 (four) times daily - after meals and at bedtime.   metoCLOPramide 5 MG tablet Commonly known as: Reglan Take 1 tablet (5 mg total) by mouth 3 (three) times daily before meals.   polyethylene glycol powder 17 GM/SCOOP powder Commonly known as: GLYCOLAX/MIRALAX Take 17 g by mouth 2 (two) times  daily as needed.   sertraline 50 MG tablet Commonly known as: ZOLOFT Take 1 tablet (50 mg total) by mouth daily.   True Metrix Air Glucose Meter Devi 1 each by Does not apply route 4 (four) times daily -  with meals and at bedtime.   True Metrix Meter w/Device Kit Use as instructed. Monitor blood glucose levels 4 times per day   vitamin C 100 MG tablet Take 100 mg by mouth daily.      Follow-up Information    Gildardo Pounds, NP. Schedule an appointment as soon as possible for a visit in 1 week(s).   Specialty: Nurse  Practitioner Contact information: [address] [place] [address] [phone]          Allergies  Allergen Reactions  . Lexapro [Escitalopram Oxalate]     NUMBNESS    Consultations:     Procedures/Studies: Dg Chest 2 View  Result Date: 05/31/2019 CLINICAL DATA:  Shortness of breath, DKA, evaluate for pneumonia EXAM: CHEST - 2 VIEW COMPARISON:  Radiograph and CT 04/15/2018 FINDINGS: No consolidation, features of edema, pneumothorax, or effusion. Pulmonary vascularity is normally distributed. The cardiomediastinal contours are unremarkable. No acute osseous or soft tissue abnormality. IMPRESSION: No acute cardiopulmonary abnormality. Electronically Signed   By: Lovena Le M.D.   On: 05/31/2019 20:34       Subjective: No new complaints.   Discharge Exam: Vitals:   06/03/19 0809 06/03/19 1139  BP: 119/79 129/79  Pulse: 88 (!) 106  Resp: 17 18  Temp: 98.5 F (36.9 C) 98.8 F (37.1 C)  SpO2: 100% 100%   Vitals:   06/02/19 2348 06/03/19 0407 06/03/19 0809 06/03/19 1139  BP: 124/87 125/75 119/79 129/79  Pulse: 96 84 88 (!) 106  Resp: 20 16 17 18   Temp: 98.5 F (36.9 C) 97.9 F (36.6 C) 98.5 F (36.9 C) 98.8 F (37.1 C)  TempSrc: Oral  Oral Oral  SpO2: 100% 100% 100% 100%  Weight:      Height:        General: Pt is alert, awake, not in acute distress Cardiovascular: RRR, S1/S2 +, no rubs, no gallops Respiratory: CTA bilaterally, no wheezing, no rhonchi Abdominal: Soft, NT, ND, bowel sounds + Extremities: no edema, no cyanosis    The results of significant diagnostics from this hospitalization (including imaging, microbiology, ancillary and laboratory) are listed below for reference.     Microbiology: Recent Results (from the past 240 hour(s))  SARS CORONAVIRUS 2 (TAT 6-24 HRS) Nasopharyngeal Nasopharyngeal Swab     Status: None   Collection Time: 05/31/19 10:44 PM   Specimen: Nasopharyngeal Swab  Result Value Ref Range Status   SARS  Coronavirus 2 NEGATIVE NEGATIVE Final    Comment: (NOTE) SARS-CoV-2 target nucleic acids are NOT DETECTED. The SARS-CoV-2 RNA is generally detectable in upper and lower respiratory specimens during the acute phase of infection. Negative results do not preclude SARS-CoV-2 infection, do not rule out co-infections with other pathogens, and should not be used as the sole basis for treatment or other patient management decisions. Negative results must be combined with clinical observations, patient history, and epidemiological information. The expected result is Negative. Fact Sheet for Patients: SugarRoll.be Fact Sheet for Healthcare Providers: https://www.woods-mathews.com/ This test is not yet approved or cleared by the [place] FDA and  has been authorized for detection and/or diagnosis of SARS-CoV-2 by FDA under an Emergency Use Authorization (EUA). This EUA will remain  in effect (meaning this test can be  used) for the duration of the COVID-19 declaration under Section 56 4(b)(1) of the Act, 21 U.S.C. section 360bbb-3(b)(1), unless the authorization is terminated or revoked sooner. Performed at [facility], [place] [address], [place], [place] [address]   MRSA PCR Screening     Status: None   Collection Time: 06/01/19  9:18 AM   Specimen: Nasal Mucosa; Nasopharyngeal  Result Value Ref Range Status   MRSA by PCR NEGATIVE NEGATIVE Final    Comment:        The GeneXpert MRSA Assay (FDA approved for NASAL specimens only), is one component of a comprehensive MRSA colonization surveillance program. It is not intended to diagnose MRSA infection nor to guide or monitor treatment for MRSA infections. Performed at [facility], [place] [address], [place], [place] [address]      Labs: BNP (last 3 results) No results for input(s): BNP in the last 8760 hours. Basic Metabolic Panel: Recent Labs  Lab 06/01/19 2150  06/02/19 0203 06/02/19 0600 06/02/19 1151 06/03/19 0847  NA 136 136 136 137 138  K 3.4* 3.6 3.4* 4.1 3.6  CL 113* 114* 113* 113* 111  CO2 17* 17* 18* 17* 20*  GLUCOSE 146* 159* 214* 253* 190*  BUN 9 8 7  5* 8  CREATININE 0.59 0.53 0.58 0.54 0.53  CALCIUM 8.3* 7.6* 7.5* 7.7* 8.1*   Liver Function Tests: No results for input(s): AST, ALT, ALKPHOS, BILITOT, PROT, ALBUMIN in the last 168 hours. No results for input(s): LIPASE, AMYLASE in the last 168 hours. No results for input(s): AMMONIA in the last 168 hours. CBC: Recent Labs  Lab 05/31/19 1647 06/02/19 1140  WBC 6.4 3.9*  NEUTROABS  --  1.9  HGB 15.4* 10.5*  HCT 50.7* 33.3*  MCV 89.7 87.4  PLT 295 236   Cardiac Enzymes: No results for input(s): CKTOTAL, CKMB, CKMBINDEX, TROPONINI in the last 168 hours. BNP: Invalid input(s): POCBNP CBG: Recent Labs  Lab 06/02/19 1209 06/02/19 1728 06/02/19 2203 06/03/19 0744 06/03/19 1141  GLUCAP 231* 269* 220* 173* 296*   D-Dimer No results for input(s): DDIMER in the last 72 hours. Hgb A1c Recent Labs    06/02/19 0800  HGBA1C 9.5*   Lipid Profile No results for input(s): CHOL, HDL, LDLCALC, TRIG, CHOLHDL, LDLDIRECT in the last 72 hours. Thyroid function studies No results for input(s): TSH, T4TOTAL, T3FREE, THYROIDAB in the last 72 hours.  Invalid input(s): FREET3 Anemia work up No results for input(s): VITAMINB12, FOLATE, FERRITIN, TIBC, IRON, RETICCTPCT in the last 72 hours. Urinalysis    Component Value Date/Time   COLORURINE STRAW (A) 05/31/2019 1603   APPEARANCEUR CLEAR 05/31/2019 1603   LABSPEC 1.014 05/31/2019 1603   PHURINE 5.0 05/31/2019 1603   GLUCOSEU >=500 (A) 05/31/2019 1603   HGBUR SMALL (A) 05/31/2019 1603   BILIRUBINUR NEGATIVE 05/31/2019 1603   BILIRUBINUR neg 06/21/2018 0845   KETONESUR 80 (A) 05/31/2019 1603   PROTEINUR 30 (A) 05/31/2019 1603   UROBILINOGEN 0.2 06/21/2018 0845   UROBILINOGEN 0.2 08/07/2016 1610   NITRITE NEGATIVE  05/31/2019 1603   LEUKOCYTESUR NEGATIVE 05/31/2019 1603   Sepsis Labs Invalid input(s): PROCALCITONIN,  WBC,  LACTICIDVEN Microbiology Recent Results (from the past 240 hour(s))  SARS CORONAVIRUS 2 (TAT 6-24 HRS) Nasopharyngeal Nasopharyngeal Swab     Status: None   Collection Time: 05/31/19 10:44 PM   Specimen: Nasopharyngeal Swab  Result Value Ref Range Status   SARS Coronavirus 2 NEGATIVE NEGATIVE Final    Comment: (NOTE) SARS-CoV-2 target  nucleic acids are NOT DETECTED. The SARS-CoV-2 RNA is generally detectable in upper and lower respiratory specimens during the acute phase of infection. Negative results do not preclude SARS-CoV-2 infection, do not rule out co-infections with other pathogens, and should not be used as the sole basis for treatment or other patient management decisions. Negative results must be combined with clinical observations, patient history, and epidemiological information. The expected result is Negative. Fact Sheet for Patients: SugarRoll.be Fact Sheet for Healthcare Providers: https://www.woods-mathews.com/ This test is not yet approved or cleared by the [place] FDA and  has been authorized for detection and/or diagnosis of SARS-CoV-2 by FDA under an Emergency Use Authorization (EUA). This EUA will remain  in effect (meaning this test can be used) for the duration of the COVID-19 declaration under Section 56 4(b)(1) of the Act, 21 U.S.C. section 360bbb-3(b)(1), unless the authorization is terminated or revoked sooner. Performed at [facility], [place] [address] [place], [place] [address]   MRSA PCR Screening     Status: None   Collection Time: 06/01/19  9:18 AM   Specimen: Nasal Mucosa; Nasopharyngeal  Result Value Ref Range Status   MRSA by PCR NEGATIVE NEGATIVE Final    Comment:        The GeneXpert MRSA Assay (FDA approved for NASAL specimens only), is one component of a comprehensive MRSA  colonization surveillance program. It is not intended to diagnose MRSA infection nor to guide or monitor treatment for MRSA infections. Performed at [facility], [place] [address] [place], [place] [address]      Time coordinating discharge: 31 minutes  SIGNED:   Hosie Poisson, MD  [facility] 06/04/2019, 9:24 AM Pager   If 7PM-7AM, please contact night-coverage [email or web address] Password [email or web address]

## 2019-06-21 ENCOUNTER — Encounter: Payer: Self-pay | Admitting: Nurse Practitioner

## 2019-07-08 ENCOUNTER — Ambulatory Visit: Payer: Self-pay | Admitting: Internal Medicine

## 2019-08-15 ENCOUNTER — Other Ambulatory Visit: Payer: Self-pay

## 2019-08-15 ENCOUNTER — Encounter (HOSPITAL_COMMUNITY): Payer: Self-pay

## 2019-08-15 ENCOUNTER — Ambulatory Visit (HOSPITAL_COMMUNITY)
Admission: EM | Admit: 2019-08-15 | Discharge: 2019-08-15 | Disposition: A | Discharge status: Home / Self Care | Payer: Self-pay | Attending: Family Medicine | Admitting: Family Medicine

## 2019-08-15 DIAGNOSIS — N898 Other specified noninflammatory disorders of vagina: Secondary | ICD-10-CM | POA: Insufficient documentation

## 2019-08-15 DIAGNOSIS — R300 Dysuria: Secondary | ICD-10-CM | POA: Insufficient documentation

## 2019-08-15 DIAGNOSIS — E1065 Type 1 diabetes mellitus with hyperglycemia: Secondary | ICD-10-CM | POA: Insufficient documentation

## 2019-08-15 LAB — POCT URINALYSIS DIP (DEVICE)
Bilirubin Urine: NEGATIVE
Glucose, UA: 500 mg/dL — AB
Hgb urine dipstick: NEGATIVE
Ketones, ur: >=160 mg/dL — AB
Leukocytes,Ua: NEGATIVE
Nitrite: NEGATIVE
Protein, ur: NEGATIVE mg/dL
Specific Gravity, Urine: 1.025 (ref 1.005–1.030)
Urobilinogen, UA: 0.2 mg/dL (ref 0.0–1.0)
pH: 5.5 (ref 5.0–8.0)

## 2019-08-15 MED ORDER — FLUCONAZOLE 150 MG PO TABS: ORAL_TABLET | ORAL | 1 refills | Status: DC

## 2019-08-15 MED FILL — ?HUMALOG 100 UNITS/ML VIAL: 100 | 28 days supply | Qty: 10 | Fill #0

## 2019-08-15 MED FILL — FLUCONAZOLE 150 MG TABS: 150 | 3 days supply | Qty: 2 | Fill #0

## 2019-08-15 MED FILL — TRUE METRIX TEST STRIP: 25 days supply | Qty: 100 | Fill #1

## 2019-08-15 NOTE — Discharge Instructions (Signed)
We have sent testing for sexually transmitted infections. We will notify you of any positive results once they are received. If required, we will prescribe any medications you might need.  Please refrain from all sexual activity for at least the next seven days.  

## 2019-08-15 NOTE — ED Triage Notes (Signed)
Pt present UTI and cystitis symptoms that started about a week ago. Pt states that she has been going to the bathroom more frequently with some burning and tingling sensation

## 2019-08-15 NOTE — ED Provider Notes (Signed)
[facility]   [id number] 08/15/19 Arrival Time: [id number]  ASSESSMENT & PLAN:  1. Dysuria   2. Vaginal discharge   3. Type 1 diabetes mellitus with hyperglycemia (HCC)     U/A without signs of bacterial infection. Showing 500 glucose and >160 ketones. She reports sugars have been running slightly higher than usual. Reports taking medications as directed. Stop drinking OTC juices. She does report vaginal yeast infections with similar symptoms.   Begin: Meds ordered this encounter  Medications  . fluconazole (DIFLUCAN) 150 MG tablet    Sig: Take one tablet by mouth as a single dose. May repeat in 3 days if symptoms persist.    Dispense:  2 tablet    Refill:  1     Discharge Instructions     We have sent testing for sexually transmitted infections. We will notify you of any positive results once they are received. If required, we will prescribe any medications you might need.  Please refrain from all sexual activity for at least the next seven days.     Pending: Labs Reviewed  RPR  HIV ANTIBODY (ROUTINE TESTING W REFLEX)  CERVICOVAGINAL ANCILLARY ONLY    Will notify of any positive results. Instructed to refrain from sexual activity for at least seven days. Declines empiric tx for gonorrhea/chlamydia.  Recommend: Follow-up Information    Gildardo Pounds, NP.   Specialty: Nurse Practitioner Why: As needed. Contact information: [place] [address] [phone]         Especially if blood sugars continue to remain high.  Reviewed expectations re: course of current medical issues. Questions answered. Outlined signs and symptoms indicating need for more acute intervention. Patient verbalized understanding. After Visit Summary given.   SUBJECTIVE:  [name] is a 34 y.o. female who presents with complaint of dysuria and vaginal discharge. Dysuria desc as "tingling and burning" mainly at very end of urination. No vaginal rashes  reported. Vaginal discharge is scant and white.  With vaginal itching. Gradual onset; first noted over the past week; questions longer. Is sexually active but does not report any concern over STD; would like testing though. Afebrile. No abdominal or pelvic pain. No back pain. Has been drinking cranberry juice that would explain her elevated blood sugars recently.  ROS: As per HPI. All other systems negative.   OBJECTIVE:  Vitals:   08/15/19 1328  BP: 112/77  Pulse: (!) 102  Resp: 18  Temp: 98.1 F (36.7 C)  SpO2: 100%     General appearance: alert, cooperative, appears stated age and no distress Throat: lips, mucosa, and tongue normal; teeth and gums normal CV: RRR Lungs: CTAB Back: no CVA tenderness; FROM at waist Abdomen: soft, non-tender GU: deferred Skin: warm and dry Psychological: alert and cooperative; normal mood and affect.  Results for orders placed or performed during the hospital encounter of 08/15/19  POCT urinalysis dip (device)  Result Value Ref Range   Glucose, UA 500 (A) NEGATIVE mg/dL   Bilirubin Urine NEGATIVE NEGATIVE   Ketones, ur >=160 (A) NEGATIVE mg/dL   Specific Gravity, Urine 1.025 1.005 - 1.030   Hgb urine dipstick NEGATIVE NEGATIVE   pH 5.5 5.0 - 8.0   Protein, ur NEGATIVE NEGATIVE mg/dL   Urobilinogen, UA 0.2 0.0 - 1.0 mg/dL   Nitrite NEGATIVE NEGATIVE   Leukocytes,Ua NEGATIVE NEGATIVE     Allergies  Allergen Reactions  . Lexapro [Escitalopram Oxalate]     NUMBNESS    Past Medical History:  Diagnosis Date  . Diabetes mellitus without complication ([place])   . DKA, type 1 ([place])   . Type 1 diabetes ([place])   . Type 1 diabetes mellitus (HCC)    Family History  Problem Relation Age of Onset  . Throat cancer Mother   . Diabetes Maternal Aunt    Social History   Socioeconomic History  . Marital status: Single    Spouse name: Not on file  . Number of children: Not on file  . Years of education: Not on file  . Highest education level:  Not on file  Occupational History  . Not on file  Tobacco Use  . Smoking status: Passive Smoke Exposure - Never Smoker  . Smokeless tobacco: Never Used  Substance and Sexual Activity  . Alcohol use: Yes    Comment: occasionally   . Drug use: No  . Sexual activity: Not Currently  Other Topics Concern  . Not on file  Social History Narrative  . Not on file   Social Determinants of Health   Financial Resource Strain:   . Difficulty of Paying Living Expenses: Not on file  Food Insecurity:   . Worried About Charity fundraiser in the Last Year: Not on file  . Ran Out of Food in the Last Year: Not on file  Transportation Needs:   . Lack of Transportation (Medical): Not on file  . Lack of Transportation (Non-Medical): Not on file  Physical Activity:   . Days of Exercise per Week: Not on file  . Minutes of Exercise per Session: Not on file  Stress:   . Feeling of Stress : Not on file  Social Connections:   . Frequency of Communication with Friends and Family: Not on file  . Frequency of Social Gatherings with Friends and Family: Not on file  . Attends Religious Services: Not on file  . Active Member of Clubs or Organizations: Not on file  . Attends Archivist Meetings: Not on file  . Marital Status: Not on file  Intimate Partner Violence:   . Fear of Current or Ex-Partner: Not on file  . Emotionally Abused: Not on file  . Physically Abused: Not on file  . Sexually Abused: Not on file          Vanessa Kick, MD 08/17/19 1001

## 2019-08-15 NOTE — ED Notes (Signed)
Attempted blood draw, Rad tech stuck 2 x, nurse stuck 2 x . Could not obtain blood. PT elects to leave without test.

## 2019-08-17 LAB — CERVICOVAGINAL ANCILLARY ONLY
Bacterial vaginitis: NEGATIVE
Candida vaginitis: POSITIVE — AB
Chlamydia: NEGATIVE
Neisseria Gonorrhea: NEGATIVE
Trichomonas: NEGATIVE

## 2019-08-29 MED FILL — FLUCONAZOLE 150 MG TABS: 150 | 3 days supply | Qty: 2 | Fill #1

## 2020-01-11 MED FILL — hydrOXYzine HCL 25 MG TABS: 25 | 20 days supply | Qty: 60 | Fill #1

## 2020-01-11 MED FILL — ?HUMALOG 100 UNITS/ML VIAL: 100 | 28 days supply | Qty: 10 | Fill #1

## 2020-04-17 MED FILL — ?HUMALOG 100 UNITS/ML VIAL: 100 | 28 days supply | Qty: 10 | Fill #2

## 2020-04-19 ENCOUNTER — Other Ambulatory Visit: Payer: Self-pay | Admitting: Nurse Practitioner

## 2020-04-19 MED FILL — TRUE METRIX TEST STRIP: 25 days supply | Qty: 100 | Fill #0

## 2020-04-19 NOTE — Telephone Encounter (Signed)
Requested Prescriptions  Pending Prescriptions Disp Refills   glucose blood test strip [Pharmacy Med Name: TRUE METRIX TEST STRIP] 100 each 12    Sig: Use as instructed     Endocrinology: Diabetes - Testing Supplies Failed - 04/19/2020  8:12 AM      Failed - Valid encounter within last 12 months    Recent Outpatient Visits          1 year ago Anxiety and depression   [place], Vernia Buff, NP   1 year ago Well woman exam with routine gynecological exam   [place], Vernia Buff, NP   1 year ago Uncontrolled type 1 diabetes mellitus without complication [facility])   [place], Vernia Buff, NP

## 2020-04-19 NOTE — Telephone Encounter (Signed)
Refill request for test strips; last refill 08/15/19; no valid encounter within last 12 months; no upcoming visits noted; attempted to contact patient;left message on voicemail; 30 day courtesy refill to cover patient until appt; visit needed for additional refills

## 2020-04-20 MED FILL — FLUCONAZOLE 150 MG TABLET: 150 | 3 days supply | Qty: 3 | Fill #0

## 2020-04-20 MED FILL — SULFAMETHOXAZOLE-TMP DS TAB: 800-160 | 10 days supply | Qty: 20 | Fill #0

## 2020-04-25 ENCOUNTER — Ambulatory Visit: Payer: Self-pay

## 2020-05-30 ENCOUNTER — Ambulatory Visit: Payer: Self-pay | Admitting: Nurse Practitioner

## 2020-06-05 ENCOUNTER — Other Ambulatory Visit: Payer: Self-pay | Admitting: Nurse Practitioner

## 2020-06-05 ENCOUNTER — Encounter: Payer: Self-pay | Admitting: Nurse Practitioner

## 2020-06-05 ENCOUNTER — Ambulatory Visit: Payer: Self-pay | Attending: Nurse Practitioner | Admitting: Nurse Practitioner

## 2020-06-05 ENCOUNTER — Other Ambulatory Visit: Payer: Self-pay

## 2020-06-05 VITALS — BP 138/96 | HR 87 | Temp 97.7°F | Ht 66.0 in | Wt 161.0 lb

## 2020-06-05 DIAGNOSIS — E785 Hyperlipidemia, unspecified: Secondary | ICD-10-CM

## 2020-06-05 DIAGNOSIS — K5909 Other constipation: Secondary | ICD-10-CM

## 2020-06-05 DIAGNOSIS — Z1159 Encounter for screening for other viral diseases: Secondary | ICD-10-CM

## 2020-06-05 DIAGNOSIS — Z0000 Encounter for general adult medical examination without abnormal findings: Secondary | ICD-10-CM

## 2020-06-05 DIAGNOSIS — F419 Anxiety disorder, unspecified: Secondary | ICD-10-CM

## 2020-06-05 DIAGNOSIS — L680 Hirsutism: Secondary | ICD-10-CM

## 2020-06-05 DIAGNOSIS — F32A Depression, unspecified: Secondary | ICD-10-CM

## 2020-06-05 DIAGNOSIS — E1065 Type 1 diabetes mellitus with hyperglycemia: Secondary | ICD-10-CM

## 2020-06-05 DIAGNOSIS — R2232 Localized swelling, mass and lump, left upper limb: Secondary | ICD-10-CM

## 2020-06-05 DIAGNOSIS — R519 Headache, unspecified: Secondary | ICD-10-CM

## 2020-06-05 DIAGNOSIS — E282 Polycystic ovarian syndrome: Secondary | ICD-10-CM

## 2020-06-05 DIAGNOSIS — N6325 Unspecified lump in the left breast, overlapping quadrants: Secondary | ICD-10-CM

## 2020-06-05 DIAGNOSIS — G8929 Other chronic pain: Secondary | ICD-10-CM

## 2020-06-05 DIAGNOSIS — D649 Anemia, unspecified: Secondary | ICD-10-CM

## 2020-06-05 LAB — GLUCOSE, POCT (MANUAL RESULT ENTRY): POC Glucose: 166 mg/dl — AB (ref 70–99)

## 2020-06-05 MED ORDER — BD PEN NEEDLE MINI U/F 31G X 5 MM MISC: 2 refills | Status: DC

## 2020-06-05 MED ORDER — ATORVASTATIN CALCIUM 10 MG PO TABS: 10.0000 mg | ORAL_TABLET | Freq: Every day | ORAL | 3 refills | Status: DC

## 2020-06-05 MED ORDER — HUMALOG JUNIOR KWIKPEN 100 UNIT/ML ~~LOC~~ SOPN: 7.0000 [IU] | PEN_INJECTOR | Freq: Three times a day (TID) | SUBCUTANEOUS | 2 refills | Status: DC

## 2020-06-05 MED ORDER — BASAGLAR KWIKPEN 100 UNIT/ML ~~LOC~~ SOPN: 16.0000 [IU] | PEN_INJECTOR | Freq: Every day | SUBCUTANEOUS | 6 refills | Status: DC

## 2020-06-05 MED ORDER — SENNOSIDES-DOCUSATE SODIUM 8.6-50 MG PO TABS: 2.0000 | ORAL_TABLET | Freq: Two times a day (BID) | ORAL | 1 refills | Status: AC

## 2020-06-05 MED ORDER — GLUCOSE BLOOD VI STRP: ORAL_STRIP | 12 refills | Status: DC

## 2020-06-05 MED ORDER — TOPIRAMATE 25 MG PO TABS: 25.0000 mg | ORAL_TABLET | Freq: Every day | ORAL | 1 refills | Status: DC

## 2020-06-05 MED ORDER — TRUEPLUS LANCETS 28G MISC: 3 refills | Status: DC

## 2020-06-05 MED ORDER — HYDROXYZINE HCL 50 MG PO TABS: 50.0000 mg | ORAL_TABLET | Freq: Three times a day (TID) | ORAL | 1 refills | Status: DC | PRN

## 2020-06-05 MED FILL — ATORVASTATIN 10 MG TABLET: 10 | 30 days supply | Qty: 30 | Fill #0

## 2020-06-05 MED FILL — TRUE METRIX TEST STRIP: 25 days supply | Qty: 100 | Fill #0

## 2020-06-05 MED FILL — TRUEPLUS PEN NDL 31GX3/16: 31G X 5 MM | 33 days supply | Qty: 100 | Fill #0

## 2020-06-05 MED FILL — !BASAGLAR 100 UNIT/ML KWIK: 100 | 37 days supply | Qty: 6 | Fill #0

## 2020-06-05 MED FILL — TRUEplus LANCETS 28G MISC: 25 days supply | Qty: 100 | Fill #0

## 2020-06-05 MED FILL — hydrOXYzine HCL 50 MG TABS: 50 | 30 days supply | Qty: 90 | Fill #0

## 2020-06-05 MED FILL — TOPIRAMATE 25 MG TABS: 25 | 30 days supply | Qty: 30 | Fill #0

## 2020-06-05 NOTE — Progress Notes (Signed)
[name] was seen today for annual exam.  Diagnoses and all orders for this visit:  Encounter for annual physical exam  Type 1 diabetes mellitus with hyperglycemia ([place]) -     CMP14+EGFR -     Hemoglobin A1c -     Microalbumin/Creatinine Ratio, Urine -     Glucose (CBG) -     Insulin Glargine (BASAGLAR KWIKPEN) 100 UNIT/ML; Inject 16 Units into the skin daily. INJECT 16 UNITS UNDER THE SKIN ONCE DAILY. -     Insulin Pen Needle (B-D UF III MINI PEN NEEDLES) 31G X 5 MM MISC; Use as instructed. Inject into the skin three times daily -     glucose blood test strip; Use as instructed. Inject into the skin four time daily. E11.65 -     TRUEplus Lancets 28G MISC; Use as instructed. Inject into the skin four times daily Continue blood sugar control as discussed in office today, low carbohydrate diet, and regular physical exercise as tolerated, 150 minutes per week (30 min each day, 5 days per week, or 50 min 3 days per week). Keep blood sugar logs with fasting goal of 90-130 mg/dl, post prandial (after you eat) less than 180.  For Hypoglycemia: BS <60 and Hyperglycemia BS >400; contact the clinic ASAP. Annual eye exams and foot exams are recommended.   Dyslipidemia, goal LDL below 70 -     Lipid panel -     atorvastatin (LIPITOR) 10 MG tablet; Take 1 tablet (10 mg total) by mouth daily. INSTRUCTIONS: Work on a low fat, heart healthy diet and participate in regular aerobic exercise program by working out at least 150 minutes per week; 5 days a week-30 minutes per day. Avoid red meat/beef/steak,  fried foods. junk foods, sodas, sugary drinks, unhealthy snacking, alcohol and smoking.  Drink at least 80 oz of water per day and monitor your carbohydrate intake daily.    Anemia, unspecified type -     CBC  Anxiety and depression -     hydrOXYzine (ATARAX/VISTARIL) 50 MG tablet; Take 1 tablet (50 mg total) by mouth 3 (three) times daily as needed for anxiety.  PCOS (polycystic ovarian syndrome) -      US PELVIC COMPLETE WITH TRANSVAGINAL; Future  Breast lump on left side at 3 o'clock position -     US BREAST LTD UNI LEFT INC AXILLA; Future -     MM Digital Diagnostic Bilat; Future  Mass of left axilla  Female hirsutism -     TSH -     Prolactin -     Testosterone -     Follicle Stimulating Hormone -     Beta hCG quant (ref lab)  Chronic constipation -     senna-docusate (SENOKOT-S) 8.6-50 MG tablet; Take 2 tablets by mouth 2 (two) times daily.  Chronic nonintractable headache, unspecified headache type -     topiramate (TOPAMAX) 25 MG tablet; Take 1 tablet (25 mg total) by mouth daily.  Need for hepatitis C screening test -     Hepatitis C Antibody   Patient has been counseled on age-appropriate routine health concerns for screening and prevention. These are reviewed and up-to-date. Referrals have been placed accordingly. Immunizations are up-to-date or declined.    Subjective:   Chief Complaint  Patient presents with  . Annual Exam    Pt. is here for a physical.    HPI [name] 34 y.o. female presents to office today for annual physical.   Constant headache in  the right temporal area. Pain is described as aching pain that "goes up and down" and "sits" in the temporal area. Started 2 weeks ago.  She does have a history of migraines and is currently taking pseudoephedrine for symptoms. She has not had an eye exam in a few years. She has tried fioricet in the past for migraines.   Type 1 DM Poorly controlled she has not seen endocrinology in over a year. Patient has been advised to apply for financial assistance and schedule to see our financial counselor. Average meter readings: 160s. Monitoring her blood glucose levels 2-3 times per day with average 180-190s.   Lab Results  Component Value Date   HGBA1C 10.3 (H) 06/05/2020    Anxiety and Depression  Declines SSRI.  Would like to try to increase hydroxyzine for better control of anxiety symptoms.  Depression  screen [facility] 2/9 06/05/2020 06/05/2020 04/11/2019 03/08/2019 06/21/2018  Decreased Interest 1 1 0 2 0  Down, Depressed, Hopeless 0 0 2 2 0  PHQ - 2 Score 1 1 2 4  0  Altered sleeping 2 2 2 2  -  Tired, decreased energy 1 1 0 3 -  Change in appetite 1 1 2 3  -  Feeling bad or failure about yourself  0 0 1 2 -  Trouble concentrating 1 1 0 2 -  Moving slowly or fidgety/restless 0 0 0 1 -  Suicidal thoughts 0 0 0 1 -  PHQ-9 Score 6 6 7 18  -  Difficult doing work/chores - - - - -   GAD 7 : Generalized Anxiety Score 06/05/2020 04/11/2019 03/08/2019 07/25/2015  Nervous, Anxious, on Edge 3 3 3 2   Control/stop worrying 3 3 3 1   Worry too much - different things 3 3 3 1   Trouble relaxing 3 3 3 1   Restless 1 0 1 1  Easily annoyed or irritable 2 2 3 1   Afraid - awful might happen 3 1 3 3   Total GAD 7 Score 18 15 19 10   Anxiety Difficulty - - - Not difficult at all   She his seeing a specialist for hirsutism. States she was instructed to also "be checked" for PCOS.    Review of Systems  Constitutional: Negative for fever, malaise/fatigue and weight loss.  HENT: Positive for ear pain (bilateral). Negative for nosebleeds.   Eyes: Negative.  Negative for blurred vision, double vision and photophobia.  Respiratory: Negative.  Negative for cough and shortness of breath.   Cardiovascular: Negative.  Negative for chest pain, palpitations and leg swelling.  Gastrointestinal: Positive for constipation. Negative for abdominal pain, blood in stool, diarrhea, heartburn, melena, nausea and vomiting.  Genitourinary: Negative.   Musculoskeletal: Negative.  Negative for myalgias.  Skin: Negative.   Neurological: Positive for headaches. Negative for dizziness, focal weakness and seizures.  Endo/Heme/Allergies: Negative.   Psychiatric/Behavioral: Negative.  Negative for suicidal ideas.    Past Medical History:  Diagnosis Date  . DKA, type 1 ([place])   . Type 1 diabetes mellitus ([facility])     No past surgical history  on file.  Family History  Problem Relation Age of Onset  . Throat cancer Mother   . Diabetes Maternal Aunt     Social History Reviewed with no changes to be made today.   Outpatient Medications Prior to Visit  Medication Sig Dispense Refill  . Ascorbic Acid (VITAMIN C) 100 MG tablet Take 100 mg by mouth daily.    . Blood Glucose Monitoring Suppl (TRUE METRIX AIR GLUCOSE  METER) DEVI 1 each by Does not apply route 4 (four) times daily -  with meals and at bedtime. 1 Device 0  . Lancet Device MISC 1 each by Does not apply route 4 (four) times daily - after meals and at bedtime. 1 each 12  . atorvastatin (LIPITOR) 10 MG tablet Take 1 tablet (10 mg total) by mouth daily. 90 tablet 3  . fluconazole (DIFLUCAN) 150 MG tablet Take one tablet by mouth as a single dose. May repeat in 3 days if symptoms persist. 2 tablet 1  . glucose blood test strip Use as instructed 100 each 12  . hydrOXYzine (ATARAX/VISTARIL) 25 MG tablet Take 1 tablet (25 mg total) by mouth 3 (three) times daily as needed for anxiety. 60 tablet 0  . insulin lispro (HUMALOG) 100 UNIT/ML injection Inject 0.07 mLs (7 Units total) into the skin 3 (three) times daily before meals. Max daily 35 units 15 mL 11  . INSULIN SYRINGE .5CC/29G 29G X 1/2" 0.5 ML MISC 1 each by Does not apply route 4 (four) times daily - after meals and at bedtime. 200 each 5  . polyethylene glycol powder (GLYCOLAX/MIRALAX) 17 GM/SCOOP powder Take 17 g by mouth 2 (two) times daily as needed. 3350 g 1  . sertraline (ZOLOFT) 50 MG tablet Take 1 tablet (50 mg total) by mouth daily. 30 tablet 3  . blood glucose meter kit and supplies KIT Dispense based on patient and insurance preference. Use up to four times daily as directed. (FOR ICD-9 250.00, 250.01). (Patient not taking: Reported on 06/05/2020) 1 each 0  . Blood Glucose Monitoring Suppl (TRUE METRIX METER) w/Device KIT Use as instructed. Monitor blood glucose levels 4 times per day (Patient not taking: Reported on  03/08/2019) 1 kit 0  . Insulin Glargine (BASAGLAR KWIKPEN) 100 UNIT/ML SOPN Inject 0.16 mLs (16 Units total) into the skin daily. INJECT 16 UNITS UNDER THE SKIN ONCE DAILY. 15 mL 6  . metoCLOPramide (REGLAN) 5 MG tablet Take 1 tablet (5 mg total) by mouth 3 (three) times daily before meals. 90 tablet 0  . Probiotic Product (CULTURELLE PROBIOTICS PO) Take by mouth. (Patient not taking: Reported on 06/05/2020)     No facility-administered medications prior to visit.    Allergies  Allergen Reactions  . Lexapro [Escitalopram Oxalate]     NUMBNESS       Objective:    BP (!) 138/96 (BP Location: Left Arm, Patient Position: Sitting, Cuff Size: Normal)   Pulse 87   Temp 97.7 F (36.5 C) (Temporal)   Ht 5' 6"  (1.676 m)   Wt 161 lb (73 kg)   LMP 05/17/2020 (LMP Unknown)   SpO2 100%   BMI 25.99 kg/m  Wt Readings from Last 3 Encounters:  06/05/20 161 lb (73 kg)  06/01/19 159 lb 9.8 oz (72.4 kg)  04/20/19 166 lb (75.3 kg)    Physical Exam Vitals and nursing note reviewed.  Constitutional:      Appearance: She is well-developed.  HENT:     Head: Normocephalic and atraumatic.  Cardiovascular:     Rate and Rhythm: Normal rate and regular rhythm.     Heart sounds: Normal heart sounds. No murmur heard.  No friction rub. No gallop.   Pulmonary:     Effort: Pulmonary effort is normal. No tachypnea or respiratory distress.     Breath sounds: Normal breath sounds. No decreased breath sounds, wheezing, rhonchi or rales.  Chest:     Chest wall: No tenderness.  Comments: Firm, tender, slightly fixed mass of the lext axilla approximately 2cm Abdominal:     General: Bowel sounds are normal.     Palpations: Abdomen is soft.  Musculoskeletal:        General: Normal range of motion.     Cervical back: Normal range of motion.  Skin:    General: Skin is warm and dry.  Neurological:     Mental Status: She is alert and oriented to person, place, and time.     Coordination: Coordination  normal.  Psychiatric:        Behavior: Behavior normal. Behavior is cooperative.        Thought Content: Thought content normal.        Judgment: Judgment normal.          Patient has been counseled extensively about nutrition and exercise as well as the importance of adherence with medications and regular follow-up. The patient was given clear instructions to go to ER or return to medical center if symptoms don't improve, worsen or new problems develop. The patient verbalized understanding.   Follow-up: Return for NEEDS ORANGE CARD/CAFA APP. nEEDS TO FOLLOW UP WITH ENDO.   Gildardo Pounds, FNP-BC [facility] and [facility] [place], [place]

## 2020-06-06 ENCOUNTER — Other Ambulatory Visit: Payer: Self-pay | Admitting: Pharmacist

## 2020-06-06 LAB — CBC
Hematocrit: 40.4 % (ref 34.0–46.6)
Hemoglobin: 13.1 g/dL (ref 11.1–15.9)
MCH: 27.3 pg (ref 26.6–33.0)
MCHC: 32.4 g/dL (ref 31.5–35.7)
MCV: 84 fL (ref 79–97)
Platelets: 346 10*3/uL (ref 150–450)
RBC: 4.79 x10E6/uL (ref 3.77–5.28)
RDW: 13.2 % (ref 11.7–15.4)
WBC: 4 10*3/uL (ref 3.4–10.8)

## 2020-06-06 LAB — MICROALBUMIN / CREATININE URINE RATIO
Creatinine, Urine: 10.7 mg/dL
Microalb/Creat Ratio: <28 mg/g creat (ref 0–29)
Microalbumin, Urine: <3 ug/mL

## 2020-06-06 LAB — LIPID PANEL
Chol/HDL Ratio: 1.9 ratio (ref 0.0–4.4)
Cholesterol, Total: 175 mg/dL (ref 100–199)
HDL: 93 mg/dL (ref >39)
LDL Chol Calc (NIH): 73 mg/dL (ref 0–99)
Triglycerides: 43 mg/dL (ref 0–149)
VLDL Cholesterol Cal: 9 mg/dL (ref 5–40)

## 2020-06-06 LAB — CMP14+EGFR
ALT: 9 IU/L (ref 0–32)
AST: 14 IU/L (ref 0–40)
Albumin/Globulin Ratio: 1.4 (ref 1.2–2.2)
Albumin: 4.3 g/dL (ref 3.8–4.8)
Alkaline Phosphatase: 62 IU/L (ref 44–121)
BUN/Creatinine Ratio: 13 (ref 9–23)
BUN: 9 mg/dL (ref 6–20)
Bilirubin Total: 0.7 mg/dL (ref 0.0–1.2)
CO2: 22 mmol/L (ref 20–29)
Calcium: 9.4 mg/dL (ref 8.7–10.2)
Chloride: 96 mmol/L (ref 96–106)
Creatinine, Ser: 0.71 mg/dL (ref 0.57–1.00)
GFR calc Af Amer: 129 mL/min/{1.73_m2} (ref >59)
GFR calc non Af Amer: 112 mL/min/{1.73_m2} (ref >59)
Globulin, Total: 3.1 g/dL (ref 1.5–4.5)
Glucose: 187 mg/dL — ABNORMAL HIGH (ref 65–99)
Potassium: 4.4 mmol/L (ref 3.5–5.2)
Sodium: 133 mmol/L — ABNORMAL LOW (ref 134–144)
Total Protein: 7.4 g/dL (ref 6.0–8.5)

## 2020-06-06 LAB — FOLLICLE STIMULATING HORMONE: FSH: 5.4 m[IU]/mL

## 2020-06-06 LAB — TESTOSTERONE: Testosterone: 14 ng/dL (ref 8–60)

## 2020-06-06 LAB — TSH: TSH: 0.64 u[IU]/mL (ref 0.450–4.500)

## 2020-06-06 LAB — HEMOGLOBIN A1C
Est. average glucose Bld gHb Est-mCnc: 249 mg/dL
Hgb A1c MFr Bld: 10.3 % — ABNORMAL HIGH (ref 4.8–5.6)

## 2020-06-06 LAB — HEPATITIS C ANTIBODY: Hep C Virus Ab: <0.1 s/co ratio (ref 0.0–0.9)

## 2020-06-06 LAB — BETA HCG QUANT (REF LAB): hCG Quant: <1 m[IU]/mL

## 2020-06-06 LAB — PROLACTIN: Prolactin: 10.9 ng/mL (ref 4.8–23.3)

## 2020-06-06 MED ORDER — INSULIN LISPRO (1 UNIT DIAL) 100 UNIT/ML (KWIKPEN): 7.0000 [IU] | PEN_INJECTOR | Freq: Three times a day (TID) | SUBCUTANEOUS | 1 refills | Status: DC

## 2020-06-06 MED FILL — HUMALOG 100 UNITS/ML KWIKPE: 100 | 28 days supply | Qty: 6 | Fill #0

## 2020-06-10 ENCOUNTER — Encounter: Payer: Self-pay | Admitting: Nurse Practitioner

## 2020-06-12 ENCOUNTER — Other Ambulatory Visit: Payer: Self-pay

## 2020-06-12 ENCOUNTER — Ambulatory Visit (HOSPITAL_COMMUNITY)
Admission: RE | Admit: 2020-06-12 | Discharge: 2020-06-12 | Disposition: A | Discharge status: Home / Self Care | Payer: Self-pay | Source: Ambulatory Visit | Attending: Nurse Practitioner | Admitting: Nurse Practitioner

## 2020-06-12 DIAGNOSIS — E282 Polycystic ovarian syndrome: Secondary | ICD-10-CM | POA: Insufficient documentation

## 2020-06-18 ENCOUNTER — Telehealth: Payer: Self-pay | Admitting: *Deleted

## 2020-06-18 NOTE — Telephone Encounter (Signed)
Best contact [phone]  Pt called to check on her results, please advise

## 2020-06-18 NOTE — Telephone Encounter (Signed)
Copied from [place] [id number]. Topic: General - Other >> Jun 15, 2020 10:00 AM Antonieta Iba C wrote: Reason for CRM: pt called in for X-ray results.     CB: [phone] --please assist

## 2020-06-19 NOTE — Telephone Encounter (Signed)
Results sent via mychart Negative for PCOS. There is a small right ovarian cyst that we will re evaluate in 3 months

## 2020-06-20 NOTE — Telephone Encounter (Signed)
Spoke to patient and informed on results. Pt. Understood and is aware of PCP advising.

## 2020-09-10 MED FILL — ?BASAGLAR 100 UNITS/ML KWPE: 100 | 37 days supply | Qty: 6 | Fill #1

## 2020-09-10 MED FILL — HUMALOG 100 UNITS/ML KWIKPE: 100 | 28 days supply | Qty: 6 | Fill #1

## 2020-10-23 ENCOUNTER — Other Ambulatory Visit: Payer: Self-pay

## 2020-10-23 ENCOUNTER — Encounter: Payer: Self-pay | Admitting: Nurse Practitioner

## 2020-10-23 ENCOUNTER — Ambulatory Visit: Payer: Self-pay | Attending: Nurse Practitioner | Admitting: Nurse Practitioner

## 2020-10-23 DIAGNOSIS — Z794 Long term (current) use of insulin: Secondary | ICD-10-CM

## 2020-10-23 DIAGNOSIS — E1065 Type 1 diabetes mellitus with hyperglycemia: Secondary | ICD-10-CM

## 2020-10-23 NOTE — Progress Notes (Signed)
Virtual Visit via Telephone Note Due to national recommendations of social distancing due to [place] 19, telehealth visit is felt to be most appropriate for this patient at this time.  I discussed the limitations, risks, security and privacy concerns of performing an evaluation and management service by telephone and the availability of in person appointments. I also discussed with the patient that there may be a patient responsible charge related to this service. The patient expressed understanding and agreed to proceed.    I connected with [name] on 10/23/20  at   1:30 PM EDT  EDT by telephone and verified that I am speaking with the correct person using two identifiers.   Consent I discussed the limitations, risks, security and privacy concerns of performing an evaluation and management service by telephone and the availability of in person appointments. I also discussed with the patient that there may be a patient responsible charge related to this service. The patient expressed understanding and agreed to proceed.   Location of Patient: Private Residence   Location of Provider: [place] and [name] participating in Telemedicine visit: Joy Rankins FNP-BC [place]    History of Present Illness: Telemedicine visit for: Completion of forms  [name] has a form from [facility] requiring an attestation of her ability to safely drive or operate a motor vehicle due to her Insulin Dependent Type 1 diabetes. I have not seen [name] since 05-2020. Her last A1c was drawn at that time. I did offer to allow her to have her A1c drawn again prior to completion of forms however she declined.  She has not seen endocrinology since 04-20-2019.  Lab Results  Component Value Date   HGBA1C 10.3 (H) 06/05/2020   She is currently prescribed 16 units of basaglar and 7 units of humalog TID. The basaglar prescription has expired.     Past Medical  History:  Diagnosis Date  . DKA, type 1 ([place])   . Type 1 diabetes mellitus ([place])     History reviewed. No pertinent surgical history.  Family History  Problem Relation Age of Onset  . Throat cancer Mother   . Diabetes Maternal Aunt     Social History   Socioeconomic History  . Marital status: Single    Spouse name: Not on file  . Number of children: Not on file  . Years of education: Not on file  . Highest education level: Not on file  Occupational History  . Not on file  Tobacco Use  . Smoking status: Passive Smoke Exposure - Never Smoker  . Smokeless tobacco: Never Used  Vaping Use  . Vaping Use: Never used  Substance and Sexual Activity  . Alcohol use: Yes    Comment: occasionally   . Drug use: No  . Sexual activity: Not Currently  Other Topics Concern  . Not on file  Social History Narrative  . Not on file   Social Determinants of Health   Financial Resource Strain: Not on file  Food Insecurity: Not on file  Transportation Needs: Not on file  Physical Activity: Not on file  Stress: Not on file  Social Connections: Not on file     Observations/Objective: Awake, alert and oriented x 3   Review of Systems  Constitutional: Negative for fever, malaise/fatigue and weight loss.  HENT: Negative.  Negative for nosebleeds.   Eyes: Negative.  Negative for blurred vision, double vision and photophobia.  Respiratory: Negative.  Negative  for cough and shortness of breath.   Cardiovascular: Negative.  Negative for chest pain, palpitations and leg swelling.  Gastrointestinal: Negative.  Negative for heartburn, nausea and vomiting.  Musculoskeletal: Negative.  Negative for myalgias.  Neurological: Negative.  Negative for dizziness, focal weakness, seizures and headaches.  Psychiatric/Behavioral: Negative.  Negative for suicidal ideas.    Assessment and Plan: Diagnoses and all orders for this visit:  Type 1 diabetes mellitus with hyperglycemia ([place]) Continue blood  sugar control as discussed in office today, low carbohydrate diet, and regular physical exercise as tolerated, 150 minutes per week (30 min each day, 5 days per week, or 50 min 3 days per week). Keep blood sugar logs with fasting goal of 90-130 mg/dl, post prandial (after you eat) less than 180.  For Hypoglycemia: BS <60 and Hyperglycemia BS >400; contact the clinic ASAP. Annual eye exams and foot exams are recommended.    Follow Up Instructions Return in about 4 weeks (around 11/20/2020) for DM .     I discussed the assessment and treatment plan with the patient. The patient was provided an opportunity to ask questions and all were answered. The patient agreed with the plan and demonstrated an understanding of the instructions.   The patient was advised to call back or seek an in-person evaluation if the symptoms worsen or if the condition fails to improve as anticipated.  I provided 10 minutes of non-face-to-face time during this encounter including median intraservice time, reviewing previous notes, labs, imaging, medications and explaining diagnosis and management.  Gildardo Pounds, FNP-BC

## 2020-10-24 ENCOUNTER — Ambulatory Visit: Payer: Self-pay | Admitting: Nurse Practitioner

## 2020-11-02 ENCOUNTER — Ambulatory Visit: Payer: Self-pay

## 2020-11-21 ENCOUNTER — Telehealth: Payer: Self-pay | Admitting: Nurse Practitioner

## 2020-11-21 NOTE — Telephone Encounter (Signed)
PT needs [employer] [place] [employer] Paperwork filled out by provider ASAP because DMV suspended her licence because this paperwork was not completed. I put paperwork in provider's bin. Please follow up with pt regarding this issue.

## 2020-11-22 NOTE — Telephone Encounter (Signed)
Pt was following up on the paperwork she dropped off, wanted a call back when it is ready to collect. Please advise if pt can come pick it up tomorrow.

## 2020-11-23 NOTE — Telephone Encounter (Signed)
Pt was called and a VM was left informing patient to return phone call.   Needing more information regarding paperwork, what was not filled out correctly.

## 2020-11-23 NOTE — Telephone Encounter (Signed)
Patient came into clinic because she stated she received a call and she thought the paperwork was ready for pickup. I informed her the nurse was calling regarding needing more information.   Patient clarified that nothing was incorrect about the paperwork the first time it was filled out, rather when she picked it up and mailed it to the [facility], the [facility] stated she had not received the paperwork. Patient is just needing this paperwork re-filled out. Patient is at risk of loosing license if paperwork is not re-sent. Please advise.

## 2020-11-26 NOTE — Telephone Encounter (Signed)
Pt wants to Dr to know time frame has to go turn in license in a week or so. .  [phone]

## 2020-11-26 NOTE — Telephone Encounter (Signed)
Nothing needs to be changed on the paperwork she just needs it to be re filed out.

## 2020-11-27 NOTE — Telephone Encounter (Signed)
Pt informed Zelda is currently out of office and paperwork will be done once she returns. Pt verbalized understanding and voiced no other concerns.

## 2020-11-27 NOTE — Telephone Encounter (Signed)
Copied from [place] [id number]. Topic: General - Other >> Nov 27, 2020  2:49 PM Leward Quan A wrote: Reason for CRM: Patient called in to inform Geryl Rankins that [facility] received the paper work so please disregard

## 2020-11-28 NOTE — Telephone Encounter (Signed)
Will disregard DMV paperwork request.

## 2020-12-31 ENCOUNTER — Ambulatory Visit (HOSPITAL_COMMUNITY)
Admission: EM | Admit: 2020-12-31 | Discharge: 2020-12-31 | Disposition: A | Discharge status: Home / Self Care | Payer: Self-pay | Attending: Internal Medicine | Admitting: Internal Medicine

## 2020-12-31 ENCOUNTER — Other Ambulatory Visit: Payer: Self-pay

## 2020-12-31 ENCOUNTER — Encounter (HOSPITAL_COMMUNITY): Payer: Self-pay | Admitting: Emergency Medicine

## 2020-12-31 ENCOUNTER — Ambulatory Visit: Payer: Self-pay | Admitting: *Deleted

## 2020-12-31 DIAGNOSIS — N939 Abnormal uterine and vaginal bleeding, unspecified: Secondary | ICD-10-CM

## 2020-12-31 MED ORDER — FLUCONAZOLE 150 MG PO TABS: 150.0000 mg | ORAL_TABLET | Freq: Once | ORAL | 0 refills | Status: AC

## 2020-12-31 NOTE — Discharge Instructions (Signed)
Please avoid douching Follow-up with your GYN for evaluation If bleeding persists please follow-up with your primary care physician Please work on improving blood sugar control

## 2020-12-31 NOTE — ED Triage Notes (Signed)
Pt states that she did a peroxide and a water douche two nights ago to help with a yeast infection. Pt states that she woke up the next day and noticed that she was bleeding. Pt states that the bleeding was heavy than usual. Pt states that she has minor abdominal cramping and uses four pads through day.

## 2020-12-31 NOTE — Telephone Encounter (Signed)
Patient thought she had yeast infection- she did peroxide douche- she is now bleeding from the vagina.Patient state she has cramping on the left side. Patient advised no appointment in office- mobile unit/UC.  Reason for Disposition . [1] Constant abdominal pain AND [2] present > 2 hours  Answer Assessment - Initial Assessment Questions 1. AMOUNT: "Describe the bleeding that you are having."    - SPOTTING: spotting, or pinkish / brownish mucous discharge; does not fill panty liner or pad    - MILD:  less than 1 pad / hour; less than patient's usual menstrual bleeding   - MODERATE: 1-2 pads / hour; 1 menstrual cup every 6 hours; small-medium blood clots (e.g., pea, grape, small coin)   - SEVERE: soaking 2 or more pads/hour for 2 or more hours; 1 menstrual cup every 2 hours; bleeding not contained by pads or continuous red blood from vagina; large blood clots (e.g., golf ball, large coin)      Clotting- mild 2. ONSET: "When did the bleeding begin?" "Is it continuing now?"     Yesterday am- continuing now 3. MENSTRUAL PERIOD: "When was the last normal menstrual period?" "How is this different than your period?"     5/12- normal cycles 4. REGULARITY: "How regular are your periods?"    regular 5. ABDOMINAL PAIN: "Do you have any pain?" "How bad is the pain?"  (e.g., Scale 1-10; mild, moderate, or severe)   - MILD (1-3): doesn't interfere with normal activities, abdomen soft and not tender to touch    - MODERATE (4-7): interferes with normal activities or awakens from sleep, abdomen tender to touch    - SEVERE (8-10): excruciating pain, doubled over, unable to do any normal activities      Left sided abdominal pain 6. PREGNANCY: "Could you be pregnant?" "Are you sexually active?" "Did you recently give birth?"     No- not sexually active, no recent birth 93. BREASTFEEDING: "Are you breastfeeding?"     n/a 8. HORMONES: "Are you taking any hormone medications, prescription or OTC?" (e.g., birth  control pills, estrogen)     no 9. BLOOD THINNERS: "Do you take any blood thinners?" (e.g., Coumadin/warfarin, Pradaxa/dabigatran, aspirin)     no 10. CAUSE: "What do you think is causing the bleeding?" (e.g., recent gyn surgery, recent gyn procedure; known bleeding disorder, cervical cancer, polycystic ovarian disease, fibroids)         unsure 11. HEMODYNAMIC STATUS: "Are you weak or feeling lightheaded?" If Yes, ask: "Can you stand and walk normally?"        no 12. OTHER SYMPTOMS: "What other symptoms are you having with the bleeding?" (e.g., passed tissue, vaginal discharge, fever, menstrual-type cramps)       Discharge/itching  Protocols used: VAGINAL BLEEDING - ABNORMAL-A-AH

## 2020-12-31 NOTE — ED Provider Notes (Signed)
[place]    CSN: [id number] Arrival date & time: 12/31/20  1724      History   Chief Complaint Chief Complaint  Patient presents with  . Vaginal Bleeding  . Abdominal Pain    HPI [name] is a 35 y.o. female comes to the urgent care with complaints of vaginal bleeding which started a couple of days ago.  Patient was expecting her irregular menstrual cycle to start sometime next week.  She douched with distilled water and hydrogen peroxide the day before the period started.  After she douched he started experiencing crampy abdominal pain.  The vaginal bleeding started in the morning when she woke up from bed.  She has been passing clots and changing her sanitary pads several times a day.  No dizziness, near syncope or syncopal episodes.  She has a history of recurrent yeast infection secondary to uncontrolled blood sugars (hemoglobin A1c was 10.3).  No nausea or vomiting.  No diarrhea.   HPI  Past Medical History:  Diagnosis Date  . DKA, type 1 ([place])   . Type 1 diabetes mellitus [facility])     Patient Active Problem List   Diagnosis Date Noted  . Diabetic ketoacidosis ([place]) 04/15/2018  . Refusal of blood transfusion for reasons of conscience 12/26/2017  . DKA, type 1 ([place]) 05/19/2017  . Colitis   . Prolonged QT interval   . Hyperpigmentation of skin, postinflammatory 08/07/2016  . Dizziness 04/25/2016  . DKA (diabetic ketoacidoses) 04/25/2016  . Rhinitis, allergic 10/11/2015  . Diabetes type 1, uncontrolled ([place]) 07/25/2015  . Cephalalgia 07/25/2015  . Generalized anxiety disorder 07/25/2015  . Depression 07/25/2015  . Diabetic ketoacidosis without coma associated with type 1 diabetes mellitus ([place]) 07/11/2012  . Leukocytosis 07/11/2012  . Hyperkalemia 07/11/2012  . High anion gap metabolic acidosis 30/16/0109    History reviewed. No pertinent surgical history.  OB History   No obstetric history on file.      Home Medications    Prior to  Admission medications   Medication Sig Start Date End Date Taking? Authorizing Provider  fluconazole (DIFLUCAN) 150 MG tablet Take 1 tablet (150 mg total) by mouth once for 1 dose. 12/31/20 12/31/20 Yes Carrol Hougland, Myrene Galas, MD  Ascorbic Acid (VITAMIN C) 100 MG tablet Take 100 mg by mouth daily.    [provider]  atorvastatin (LIPITOR) 10 MG tablet TAKE 1 TABLET (10 MG TOTAL) BY MOUTH DAILY. 06/05/20 06/05/21  Gildardo Pounds, NP  Blood Glucose Monitoring Suppl (TRUE METRIX AIR GLUCOSE METER) DEVI 1 each by Does not apply route 4 (four) times daily -  with meals and at bedtime. 07/25/15   [name], FNP  glucose blood test strip Use as instructed. Inject into the skin four time daily. E11.65 06/05/20   Gildardo Pounds, NP  hydrOXYzine (ATARAX/VISTARIL) 50 MG tablet TAKE 1 TABLET (50 MG TOTAL) BY MOUTH 3 (THREE) TIMES DAILY AS NEEDED FOR ANXIETY. 06/05/20 06/05/21  Gildardo Pounds, NP  Insulin Glargine (BASAGLAR KWIKPEN) 100 UNIT/ML INJECT 16 UNITS UNDER THE SKIN ONCE DAILY. 06/05/20 06/05/21  Gildardo Pounds, NP  insulin lispro (HUMALOG) 100 UNIT/ML KwikPen INJECT 7 UNITS INTO THE SKIN 3 (THREE) TIMES DAILY. 06/06/20 06/06/21  Charlott Rakes, MD  Insulin Pen Needle 31G X 5 MM MISC USE AS INSTRUCTED. INJECT INTO THE SKIN THREE TIMES DAILY 06/05/20 06/05/21  Gildardo Pounds, NP  Lancet Device MISC 1 each by Does not apply route 4 (four) times daily -  after meals and at bedtime. 11/16/18   Gildardo Pounds, NP  topiramate (TOPAMAX) 25 MG tablet TAKE 1 TABLET (25 MG TOTAL) BY MOUTH DAILY. 06/05/20 06/05/21  Gildardo Pounds, NP  TRUEplus Lancets 28G MISC USE AS INSTRUCTED. INJECT INTO THE SKIN FOUR TIMES DAILY 06/05/20 06/05/21  Gildardo Pounds, NP    Family History Family History  Problem Relation Age of Onset  . Throat cancer Mother   . Diabetes Maternal Aunt     Social History Social History   Tobacco Use  . Smoking status: Passive Smoke Exposure - Never Smoker  . Smokeless tobacco:  Never Used  Vaping Use  . Vaping Use: Never used  Substance Use Topics  . Alcohol use: Yes    Comment: occasionally   . Drug use: No     Allergies   Lexapro [escitalopram oxalate]   Review of Systems Review of Systems  Constitutional: Negative.   HENT: Negative.   Respiratory: Negative.   Gastrointestinal: Positive for abdominal pain. Negative for nausea and vomiting.  Genitourinary: Positive for vaginal bleeding. Negative for vaginal pain.  Neurological: Negative.      Physical Exam Triage Vital Signs ED Triage Vitals  Enc Vitals Group     BP 12/31/20 1809 121/81     Pulse Rate 12/31/20 1809 92     Resp 12/31/20 1809 18     Temp 12/31/20 1809 97.7 F (36.5 C)     Temp src --      SpO2 12/31/20 1809 100 %     Weight --      Height --      Head Circumference --      Peak Flow --      Pain Score 12/31/20 1816 5     Pain Loc --      Pain Edu? --      Excl. in [place]? --    No data found.  Updated Vital Signs BP 121/81 (BP Location: Right Arm)   Pulse 92   Temp 97.7 F (36.5 C)   Resp 18   LMP 12/28/2020   SpO2 100%   Visual Acuity Right Eye Distance:   Left Eye Distance:   Bilateral Distance:    Right Eye Near:   Left Eye Near:    Bilateral Near:     Physical Exam Vitals and nursing note reviewed.  Constitutional:      General: She is not in acute distress.    Appearance: She is not ill-appearing.  Cardiovascular:     Rate and Rhythm: Normal rate and regular rhythm.  Pulmonary:     Effort: Pulmonary effort is normal.     Breath sounds: Normal breath sounds.  Abdominal:     Palpations: Abdomen is soft.     Tenderness: There is no abdominal tenderness.  Neurological:     Mental Status: She is alert.      UC Treatments / Results  Labs (all labs ordered are listed, but only abnormal results are displayed) Labs Reviewed - No data to display  EKG   Radiology No results found.  Procedures Procedures (including critical care  time)  Medications Ordered in UC Medications - No data to display  Initial Impression / Assessment and Plan / UC Course  I have reviewed the triage vital signs and the nursing notes.  Pertinent labs & imaging results that were available during my care of the patient were reviewed by me and considered in my medical decision making (see chart  for details).     1.  Vaginal bleeding: Patient is advised to monitor vaginal bleeding If it remains persistent she may reach out to her primary care provider for further evaluation and management Patient is advised to see her gynecologist for gynecological exam With regards to the recurrent vaginal yeast infection, patient is advised to improve her heat eating habits and use her insulin appropriately to get her blood sugar under control.  This will help with the recurrent vaginal yeast infections. Final Clinical Impressions(s) / UC Diagnoses   Final diagnoses:  Vaginal bleeding     Discharge Instructions     Please avoid douching Follow-up with your GYN for evaluation If bleeding persists please follow-up with your primary care physician Please work on improving blood sugar control   ED Prescriptions    Medication Sig Dispense Auth. Provider   fluconazole (DIFLUCAN) 150 MG tablet Take 1 tablet (150 mg total) by mouth once for 1 dose. 2 tablet Abdiel Blackerby, Myrene Galas, MD     PDMP not reviewed this encounter.   Chase Picket, MD 12/31/20 [phone]

## 2020-12-31 NOTE — Telephone Encounter (Signed)
Pt has an appt scheduled with provider for 6/13

## 2020-12-31 NOTE — Telephone Encounter (Signed)
NOTED

## 2021-01-07 ENCOUNTER — Ambulatory Visit: Payer: Self-pay | Admitting: Nurse Practitioner

## 2021-01-10 ENCOUNTER — Encounter (HOSPITAL_COMMUNITY): Payer: Self-pay

## 2021-01-10 ENCOUNTER — Other Ambulatory Visit: Payer: Self-pay

## 2021-01-10 ENCOUNTER — Ambulatory Visit (HOSPITAL_COMMUNITY)
Admission: EM | Admit: 2021-01-10 | Discharge: 2021-01-10 | Disposition: A | Discharge status: Home / Self Care | Payer: Self-pay | Attending: Urgent Care | Admitting: Urgent Care

## 2021-01-10 DIAGNOSIS — S0081XA Abrasion of other part of head, initial encounter: Secondary | ICD-10-CM

## 2021-01-10 MED ORDER — NAPROXEN 375 MG PO TABS: 375.0000 mg | ORAL_TABLET | Freq: Two times a day (BID) | ORAL | 0 refills | Status: DC

## 2021-01-10 MED ORDER — BACITRACIN ZINC 500 UNIT/GM EX OINT: 1.0000 "application " | TOPICAL_OINTMENT | Freq: Two times a day (BID) | CUTANEOUS | 0 refills | Status: DC

## 2021-01-10 NOTE — ED Triage Notes (Signed)
Pt presents with abrasion in the chin x 5 days. States she fell when skating.   Pt reports if she will use antibiotic, she will need medication for yeast infection.

## 2021-01-10 NOTE — ED Provider Notes (Signed)
[place]   MRN: [id number] DOB: 1985-12-10  Subjective:   [name] is a 35 y.o. female presenting for wound check.  Patient suffered an abrasion to her chin about 5 days ago.  She has been tending to the wound and applying Neosporin.  She is concerned because she is diabetic and does not want to have a severe infection.  Denies fever, drainage of pus or bleeding, nausea, vomiting, swelling.  No current facility-administered medications for this encounter.  Current Outpatient Medications:    Ascorbic Acid (VITAMIN C) 100 MG tablet, Take 100 mg by mouth daily., Disp: , Rfl:    atorvastatin (LIPITOR) 10 MG tablet, TAKE 1 TABLET (10 MG TOTAL) BY MOUTH DAILY., Disp: 90 tablet, Rfl: 3   Blood Glucose Monitoring Suppl (TRUE METRIX AIR GLUCOSE METER) DEVI, 1 each by Does not apply route 4 (four) times daily -  with meals and at bedtime., Disp: 1 Device, Rfl: 0   glucose blood test strip, Use as instructed. Inject into the skin four time daily. E11.65, Disp: 100 each, Rfl: 12   hydrOXYzine (ATARAX/VISTARIL) 50 MG tablet, TAKE 1 TABLET (50 MG TOTAL) BY MOUTH 3 (THREE) TIMES DAILY AS NEEDED FOR ANXIETY., Disp: 90 tablet, Rfl: 1   Insulin Glargine (BASAGLAR KWIKPEN) 100 UNIT/ML, INJECT 16 UNITS UNDER THE SKIN ONCE DAILY., Disp: 15 mL, Rfl: 6   insulin lispro (HUMALOG) 100 UNIT/ML KwikPen, INJECT 7 UNITS INTO THE SKIN 3 (THREE) TIMES DAILY., Disp: 15 mL, Rfl: 1   Insulin Pen Needle 31G X 5 MM MISC, USE AS INSTRUCTED. INJECT INTO THE SKIN THREE TIMES DAILY, Disp: 100 each, Rfl: 2   Lancet Device MISC, 1 each by Does not apply route 4 (four) times daily - after meals and at bedtime., Disp: 1 each, Rfl: 12   topiramate (TOPAMAX) 25 MG tablet, TAKE 1 TABLET (25 MG TOTAL) BY MOUTH DAILY., Disp: 30 tablet, Rfl: 1   TRUEplus Lancets 28G MISC, USE AS INSTRUCTED. INJECT INTO THE SKIN FOUR TIMES DAILY, Disp: 100 each, Rfl: 3   Allergies  Allergen Reactions   Lexapro [Escitalopram  Oxalate]     NUMBNESS    Past Medical History:  Diagnosis Date   DKA, type 1 ([place])    Type 1 diabetes mellitus ([place])      History reviewed. No pertinent surgical history.  Family History  Problem Relation Age of Onset   Throat cancer Mother    Diabetes Maternal Aunt     Social History   Tobacco Use   Smoking status: Passive Smoke Exposure - Never Smoker   Smokeless tobacco: Never  Vaping Use   Vaping Use: Never used  Substance Use Topics   Alcohol use: Yes    Comment: occasionally    Drug use: No    ROS   Objective:   Vitals: BP 133/81 (BP Location: Left Arm)   Pulse 100   Temp 98.3 F (36.8 C) (Oral)   Resp 18   LMP  (Within Weeks) Comment: 1 week  SpO2 100%   Physical Exam Constitutional:      General: She is not in acute distress.    Appearance: Normal appearance. She is well-developed. She is not ill-appearing, toxic-appearing or diaphoretic.  HENT:     Head: Normocephalic and atraumatic.      Nose: Nose normal.     Mouth/Throat:     Mouth: Mucous membranes are moist.     Pharynx: Oropharynx is clear.  Eyes:  General: No scleral icterus.    Extraocular Movements: Extraocular movements intact.     Pupils: Pupils are equal, round, and reactive to light.  Cardiovascular:     Rate and Rhythm: Normal rate.  Pulmonary:     Effort: Pulmonary effort is normal.  Skin:    General: Skin is warm and dry.  Neurological:     General: No focal deficit present.     Mental Status: She is alert and oriented to person, place, and time.  Psychiatric:        Mood and Affect: Mood normal.        Behavior: Behavior normal.       Assessment and Plan :   PDMP not reviewed this encounter.  1. Abrasion, chin w/o infection     Wound is resolving and healing very well.  Reassured patient that she is doing excellent wound care.  Discussed general wound care for abrasions.  Offered her bacitracin ointment if she wanted to continue using this.  Naproxen for  pain and inflammation.  Counseled patient on potential for adverse effects with medications prescribed/recommended today, ER and return-to-clinic precautions discussed, patient verbalized understanding.    Jaynee Eagles, PA-C 01/10/21 1818

## 2021-01-10 NOTE — Discharge Instructions (Addendum)
For general wound care of abrasions, use dressings changing them 2-3 times daily. Use non-stick gauze. Alternate between wet (applying ointment) and dry dressings (no ointments). Each time you change your dressing, make sure you clean gently around the perimeter of the wound and the wound itself with gentle soap and warm water. Pat your wound dry and let it air out if possible for 1-2 hours before reapplying another dressing. Once your wound scabs over completely, you do not need to keep applying dressings. You can use naproxen for the inflammation that comes with wounds like this.

## 2021-03-01 ENCOUNTER — Ambulatory Visit: Payer: Self-pay | Attending: Nurse Practitioner | Admitting: Nurse Practitioner

## 2021-03-01 ENCOUNTER — Other Ambulatory Visit: Payer: Self-pay

## 2021-03-01 ENCOUNTER — Encounter: Payer: Self-pay | Admitting: Nurse Practitioner

## 2021-03-01 VITALS — BP 110/73 | HR 100 | Ht 66.0 in | Wt 159.0 lb

## 2021-03-01 DIAGNOSIS — R2232 Localized swelling, mass and lump, left upper limb: Secondary | ICD-10-CM

## 2021-03-01 DIAGNOSIS — R519 Headache, unspecified: Secondary | ICD-10-CM

## 2021-03-01 DIAGNOSIS — N6325 Unspecified lump in the left breast, overlapping quadrants: Secondary | ICD-10-CM

## 2021-03-01 DIAGNOSIS — F32A Depression, unspecified: Secondary | ICD-10-CM

## 2021-03-01 DIAGNOSIS — F419 Anxiety disorder, unspecified: Secondary | ICD-10-CM

## 2021-03-01 DIAGNOSIS — E785 Hyperlipidemia, unspecified: Secondary | ICD-10-CM

## 2021-03-01 DIAGNOSIS — G8929 Other chronic pain: Secondary | ICD-10-CM

## 2021-03-01 DIAGNOSIS — E1065 Type 1 diabetes mellitus with hyperglycemia: Secondary | ICD-10-CM

## 2021-03-01 LAB — POCT GLYCOSYLATED HEMOGLOBIN (HGB A1C): HbA1c, POC (controlled diabetic range): 10.1 % — AB (ref 0.0–7.0)

## 2021-03-01 MED ORDER — TRUE METRIX METER W/DEVICE KIT
1.0000 | PACK | Freq: Three times a day (TID) | 0 refills | Status: AC
  Filled 2021-03-01: qty 1, 365d supply, fill #0

## 2021-03-01 MED ORDER — INSULIN LISPRO (1 UNIT DIAL) 100 UNIT/ML (KWIKPEN)
PEN_INJECTOR | SUBCUTANEOUS | 1 refills | Status: DC
  Filled 2021-03-01: qty 3, 25d supply, fill #0
  Filled 2021-09-13: qty 3, 25d supply, fill #1
  Filled 2021-09-13: qty 15, 45d supply, fill #0

## 2021-03-01 MED ORDER — HYDROXYZINE HCL 50 MG PO TABS
ORAL_TABLET | ORAL | 1 refills | Status: DC
  Filled 2021-03-01: qty 90, 30d supply, fill #0

## 2021-03-01 MED ORDER — TOPIRAMATE 25 MG PO TABS
ORAL_TABLET | Freq: Every day | ORAL | 1 refills | Status: DC
  Filled 2021-03-01: qty 30, 30d supply, fill #0

## 2021-03-01 MED ORDER — BASAGLAR KWIKPEN 100 UNIT/ML ~~LOC~~ SOPN
PEN_INJECTOR | SUBCUTANEOUS | 6 refills | Status: DC
  Filled 2021-03-01: qty 6, 37d supply, fill #0
  Filled 2021-09-13: qty 6, 37d supply, fill #1
  Filled 2021-09-13: qty 3, 19d supply, fill #0

## 2021-03-01 MED ORDER — GLUCOSE BLOOD VI STRP
ORAL_STRIP | 12 refills | Status: DC
  Filled 2021-03-01: qty 100, 25d supply, fill #0

## 2021-03-01 MED ORDER — LANCET DEVICE MISC
1.0000 | Freq: Three times a day (TID) | 12 refills | Status: DC
  Filled 2021-03-01: qty 1, fill #0

## 2021-03-01 MED ORDER — TRUEPLUS LANCETS 28G MISC
3 refills | Status: DC
  Filled 2021-03-01: qty 100, 25d supply, fill #0

## 2021-03-01 MED ORDER — ATORVASTATIN CALCIUM 10 MG PO TABS
ORAL_TABLET | Freq: Every day | ORAL | 3 refills | Status: DC
  Filled 2021-03-01: qty 30, 30d supply, fill #0

## 2021-03-01 MED ORDER — INSULIN PEN NEEDLE 31G X 5 MM MISC
2 refills | Status: DC
  Filled 2021-03-01: qty 100, 33d supply, fill #0

## 2021-03-01 NOTE — Progress Notes (Signed)
Assessment & Plan:  [name] was seen today for diabetes.  Diagnoses and all orders for this visit:  Type 1 diabetes mellitus with hyperglycemia ([place]) -     Cancel: POCT glucose (manual entry) -     POCT glycosylated hemoglobin (Hb A1C) -     Insulin Glargine (BASAGLAR KWIKPEN) 100 UNIT/ML; INJECT 16 UNITS UNDER THE SKIN ONCE DAILY. -     insulin lispro (HUMALOG) 100 UNIT/ML KwikPen; Inject 7 units into the skin three times per day. For blood sugars 0-150 give 0 units of insulin, 151-200 give 2 units of insulin, 201-250 give 4 units, 251-300 give 6 units, 301-350 give 8 units, 351-400 give 10 units,> 400 give 12 units and call M.D. Discussed hypoglycemia protocol. -     Insulin Pen Needle 31G X 5 MM MISC; USE AS INSTRUCTED. INJECT INTO THE SKIN THREE TIMES DAILY -     glucose blood test strip; Use as instructed four times daily. -     Blood Glucose Monitoring Suppl (TRUE METRIX METER) w/Device KIT; Use as directed 4 (four) times daily -  with meals and at bedtime. -     Lancet Device MISC; Use 4 times daily after meals and at bedtime. -     TRUEplus Lancets 28G MISC; USE AS INSTRUCTED FOUR TIMES DAILY -     Magnesium -     CMP14+EGFR  Chronic nonintractable headache, unspecified headache type -     topiramate (TOPAMAX) 25 MG tablet; TAKE 1 TABLET (25 MG TOTAL) BY MOUTH DAILY.  Anxiety and depression -     hydrOXYzine (ATARAX/VISTARIL) 50 MG tablet; TAKE 1 TABLET (50 MG TOTAL) BY MOUTH 3 (THREE) TIMES DAILY AS NEEDED FOR ANXIETY.  Dyslipidemia, goal LDL below 70 -     atorvastatin (LIPITOR) 10 MG tablet; TAKE 1 TABLET (10 MG TOTAL) BY MOUTH DAILY.  Axillary mass, left -     [place] AXILLA LEFT; Future  Breast lump on left side at 3 o'clock position Referred to breast clinic.    Patient has been counseled on age-appropriate routine health concerns for screening and prevention. These are reviewed and up-to-date. Referrals have been placed accordingly. Immunizations are up-to-date or  declined.    Subjective:   Chief Complaint  Patient presents with   Diabetes   HPI JEANNINE PENNISI 35 y.o. female presents to office today for follow up to DM 1  has a past medical history of DKA, type 1 ([place]) and Type 1 diabetes mellitus ([place]).   She is currently in the process of applying to be a surgical candidate for the islet transplant procedure.  Diabetes is poorly controlled. She has been out of her prescribed insulins and taking [employer] brand regular and NPH insulin. Will restart her basaglar 16 units daily and humalog 7 units TID and SSI. Blood pressure is well controlled today.  Lab Results  Component Value Date   HGBA1C 10.1 (A) 03/01/2021   Lab Results  Component Value Date   LDLCALC 73 06/05/2020    BP Readings from Last 3 Encounters:  03/01/21 110/73  01/10/21 133/81  12/31/20 121/81     Breast Mass Breast Mass: Patient presents for evaluation of a breast mass. Change was noted several months ago. Patient does routinely do self breast exams.   Breast cancer risk factors include nulliparous and delayed child bearing.   Patient denies hormonal therapy.  Patient denies nipple discharge. She does notice that the mass increases in size and tenderness to palpation with  caffeine intake.   There is also a noticeable mass adjacent to the lower left scapula  Headache Constant headache in the right temporal area. Pain is described as aching pain that "goes up and down" and "sits" in the temporal area. Onset several months ago.  She does have a history of migraines and  was prescribed topamax but is not currently taking. She states her last eye exam was normal. She has tried fioricet in the past for migraines. She states the headache is on the same side in which she had a dental implant and she wonders if this could be the cause.   Review of Systems  Constitutional:  Negative for fever, malaise/fatigue and weight loss.  HENT: Negative.  Negative for nosebleeds.   Eyes:  Negative.  Negative for blurred vision, double vision and photophobia.  Respiratory: Negative.  Negative for cough and shortness of breath.   Cardiovascular: Negative.  Negative for chest pain, palpitations and leg swelling.  Gastrointestinal: Negative.  Negative for heartburn, nausea and vomiting.  Musculoskeletal: Negative.  Negative for myalgias.  Skin:        SEE HPI  Neurological:  Positive for sensory change and headaches. Negative for dizziness, focal weakness and seizures.  Psychiatric/Behavioral:  Positive for depression (declines ssri). Negative for suicidal ideas. The patient is nervous/anxious.    Past Medical History:  Diagnosis Date   DKA, type 1 ([place])    Type 1 diabetes mellitus ([place])     No past surgical history on file.  Family History  Problem Relation Age of Onset   Throat cancer Mother    Diabetes Maternal Aunt     Social History Reviewed with no changes to be made today.   Outpatient Medications Prior to Visit  Medication Sig Dispense Refill   naproxen (NAPROSYN) 375 MG tablet Take 1 tablet (375 mg total) by mouth 2 (two) times daily with a meal. 30 tablet 0   atorvastatin (LIPITOR) 10 MG tablet TAKE 1 TABLET (10 MG TOTAL) BY MOUTH DAILY. 90 tablet 3   Blood Glucose Monitoring Suppl (TRUE METRIX AIR GLUCOSE METER) DEVI 1 each by Does not apply route 4 (four) times daily -  with meals and at bedtime. 1 Device 0   glucose blood test strip Use as instructed. Inject into the skin four time daily. E11.65 100 each 12   hydrOXYzine (ATARAX/VISTARIL) 50 MG tablet TAKE 1 TABLET (50 MG TOTAL) BY MOUTH 3 (THREE) TIMES DAILY AS NEEDED FOR ANXIETY. 90 tablet 1   Insulin Glargine (BASAGLAR KWIKPEN) 100 UNIT/ML INJECT 16 UNITS UNDER THE SKIN ONCE DAILY. 15 mL 6   insulin lispro (HUMALOG) 100 UNIT/ML KwikPen INJECT 7 UNITS INTO THE SKIN 3 (THREE) TIMES DAILY. 15 mL 1   Insulin Pen Needle 31G X 5 MM MISC USE AS INSTRUCTED. INJECT INTO THE SKIN THREE TIMES DAILY 100 each 2    Lancet Device MISC 1 each by Does not apply route 4 (four) times daily - after meals and at bedtime. 1 each 12   topiramate (TOPAMAX) 25 MG tablet TAKE 1 TABLET (25 MG TOTAL) BY MOUTH DAILY. 30 tablet 1   TRUEplus Lancets 28G MISC USE AS INSTRUCTED. INJECT INTO THE SKIN FOUR TIMES DAILY 100 each 3   Ascorbic Acid (VITAMIN C) 100 MG tablet Take 100 mg by mouth daily. (Patient not taking: Reported on 03/01/2021)     bacitracin ointment Apply 1 application topically 2 (two) times daily. (Patient not taking: Reported on 03/01/2021) 60 g 0  No facility-administered medications prior to visit.    Allergies  Allergen Reactions   Lexapro [Escitalopram Oxalate]     NUMBNESS       Objective:    BP 110/73   Pulse 100   Ht 5' 6"  (1.676 m)   Wt 159 lb (72.1 kg)   SpO2 99%   BMI 25.66 kg/m  Wt Readings from Last 3 Encounters:  03/01/21 159 lb (72.1 kg)  06/05/20 161 lb (73 kg)  06/01/19 159 lb 9.8 oz (72.4 kg)    Physical Exam Vitals and nursing note reviewed.  Constitutional:      Appearance: She is well-developed.  HENT:     Head: Normocephalic and atraumatic.  Cardiovascular:     Rate and Rhythm: Normal rate and regular rhythm.     Heart sounds: Normal heart sounds. No murmur heard.   No friction rub. No gallop.  Pulmonary:     Effort: Pulmonary effort is normal. No tachypnea or respiratory distress.     Breath sounds: Normal breath sounds. No decreased breath sounds, wheezing, rhonchi or rales.  Chest:     Chest wall: No tenderness.  Breasts:    Right: Normal.     Left: Mass present.    Abdominal:     General: Bowel sounds are normal.     Palpations: Abdomen is soft.  Musculoskeletal:        General: Normal range of motion.     Cervical back: Normal range of motion.  Skin:    General: Skin is warm and dry.  Neurological:     Mental Status: She is alert and oriented to person, place, and time.     Coordination: Coordination normal.  Psychiatric:        Behavior:  Behavior normal. Behavior is cooperative.        Thought Content: Thought content normal.        Judgment: Judgment normal.         Patient has been counseled extensively about nutrition and exercise as well as the importance of adherence with medications and regular follow-up. The patient was given clear instructions to go to ER or return to medical center if symptoms don't improve, worsen or new problems develop. The patient verbalized understanding.   Follow-up: Return in about 3 months (around 06/01/2021).   Gildardo Pounds, FNP-BC [facility], [place]   03/01/2021, 10:16 PM

## 2021-03-02 LAB — CMP14+EGFR
ALT: 10 IU/L (ref 0–32)
AST: 12 IU/L (ref 0–40)
Albumin/Globulin Ratio: 1.6 (ref 1.2–2.2)
Albumin: 4.1 g/dL (ref 3.8–4.8)
Alkaline Phosphatase: 62 IU/L (ref 44–121)
BUN/Creatinine Ratio: 14 (ref 9–23)
BUN: 10 mg/dL (ref 6–20)
Bilirubin Total: 0.4 mg/dL (ref 0.0–1.2)
CO2: 23 mmol/L (ref 20–29)
Calcium: 9.5 mg/dL (ref 8.7–10.2)
Chloride: 103 mmol/L (ref 96–106)
Creatinine, Ser: 0.7 mg/dL (ref 0.57–1.00)
Globulin, Total: 2.6 g/dL (ref 1.5–4.5)
Glucose: 131 mg/dL — ABNORMAL HIGH (ref 65–99)
Potassium: 3.8 mmol/L (ref 3.5–5.2)
Sodium: 139 mmol/L (ref 134–144)
Total Protein: 6.7 g/dL (ref 6.0–8.5)
eGFR: 116 mL/min/{1.73_m2} (ref >59)

## 2021-03-02 LAB — MAGNESIUM: Magnesium: 1.9 mg/dL (ref 1.6–2.3)

## 2021-03-04 ENCOUNTER — Other Ambulatory Visit: Payer: Self-pay

## 2021-03-04 DIAGNOSIS — N6320 Unspecified lump in the left breast, unspecified quadrant: Secondary | ICD-10-CM

## 2021-03-06 ENCOUNTER — Telehealth: Payer: Self-pay

## 2021-03-06 NOTE — Telephone Encounter (Signed)
Telephoned patient at mobile number. Patient will call back and schedule with [facility]

## 2021-03-07 ENCOUNTER — Other Ambulatory Visit: Payer: Self-pay

## 2021-03-07 NOTE — Progress Notes (Signed)
Pt has been scheduled through [facility]

## 2021-04-02 ENCOUNTER — Telehealth: Payer: Self-pay

## 2021-04-02 NOTE — Telephone Encounter (Signed)
Telephoned patient at home number. Left voice message with [facility] contact information. 

## 2021-04-04 ENCOUNTER — Other Ambulatory Visit: Payer: Self-pay

## 2021-04-04 ENCOUNTER — Ambulatory Visit: Payer: Self-pay

## 2021-05-21 ENCOUNTER — Ambulatory Visit: Payer: Self-pay | Admitting: *Deleted

## 2021-05-21 ENCOUNTER — Ambulatory Visit
Admission: RE | Admit: 2021-05-21 | Discharge: 2021-05-21 | Disposition: A | Discharge status: Home / Self Care | Payer: No Typology Code available for payment source | Source: Ambulatory Visit | Attending: Obstetrics and Gynecology | Admitting: Obstetrics and Gynecology

## 2021-05-21 ENCOUNTER — Ambulatory Visit
Admission: RE | Admit: 2021-05-21 | Discharge: 2021-05-21 | Disposition: A | Discharge status: Home / Self Care | Payer: Self-pay | Source: Ambulatory Visit | Attending: Obstetrics and Gynecology | Admitting: Obstetrics and Gynecology

## 2021-05-21 ENCOUNTER — Other Ambulatory Visit: Payer: Self-pay

## 2021-05-21 VITALS — BP 126/74 | Wt 160.3 lb

## 2021-05-21 DIAGNOSIS — N6320 Unspecified lump in the left breast, unspecified quadrant: Secondary | ICD-10-CM

## 2021-05-21 NOTE — Progress Notes (Signed)
[name] is a 35 y.o. female who presents to [facility] clinic today with complaint of left breast lump since 2010-2011 that has become painful the past two years. Patient states the pain has increased. Patient states the pain comes and goes. Patient rates the pain at a 7 out of 10.    Pap Smear: Pap smear not completed today. Last Pap smear was 03/08/2019 at [facility] and was normal. Per patient has no history of an abnormal Pap smear. Last Pap smear result is available in Epic.   Physical exam: Breasts Breasts symmetrical. No skin abnormalities bilateral breasts. No nipple retraction bilateral breasts. No nipple discharge bilateral breasts. No lymphadenopathy. No lumps palpated right breast. Palpated a lump within the left breast at 2 o'clock 7 cm from the nipple. Palpated a left axillary lump at 2 o'clock 15 cm from the nipple that measures 7 cm x 3 cm. Complaints of tenderness when palpated left breast lump.   MM DIAG BREAST TOMO BILATERAL  Result Date: 06/21/2018 CLINICAL DATA:  Palpable lump in the upper outer left breast felt by the patient's physician. Growing mass in the left mid axillary line. EXAM: DIGITAL DIAGNOSTIC BILATERAL MAMMOGRAM WITH CAD AND TOMO ULTRASOUND LEFT BREAST COMPARISON:  None ACR Breast Density Category c: The breast tissue is heterogeneously dense, which may obscure small masses. FINDINGS: No suspicious masses identified in either breast. Mammographic images were processed with CAD. On physical exam, no suspicious masses felt in the left breast. A mobile soft mass is felt in the left mid axillary line, not associated with the left breast and apparently not arising from the left axilla. Targeted ultrasound is performed, showing no abnormalities in the left breast. The mass in the left mid axillary line is slightly hyperechoic to surrounding fat measuring 1.7 x 0.6 x 5.2 cm. This mass demonstrates increased through transmission and  no posterior shadowing. IMPRESSION: No mammographic or sonographic evidence of malignancy in the left breast. The mass in the left mid axillary line is not associated with the breast and does not appear to arise from the left axilla. However, it was imaged in the breast Center today out of convenience for the patient. The mass has reassuring features but is nonspecific based on ultrasound. 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 However, other etiologies are possible, especially since the mass has grown at least 50% in size over the last 2 years. RECOMMENDATION: Recommend annual screening mammography beginning at the age of 41. Treatment of the palpable lump in the left breast should be based on clinical and physical exam given lack of imaging findings. Recommend surgical consultation for the growing mass in the left mid axillary line. I have discussed the findings and recommendations with the patient. Results were also provided in writing at the conclusion of the visit. If applicable, a reminder letter will be sent to the patient regarding the next appointment. BI-RADS CATEGORY  2: Benign. Electronically Signed   By: Dorise Bullion III M.D   On: 06/21/2018 12:24     Pelvic/Bimanual Pap is not indicated today per BCCCP guidelines.   Smoking History: Patient has never smoked.   Patient Navigation: Patient education provided. Access to services provided for patient through BCCCP program.   Breast and Cervical Cancer Risk Assessment: Patient does not have family history of breast cancer, known genetic mutations, or radiation treatment to the chest before age 46. Patient does not have history of cervical dysplasia,  immunocompromised, or DES exposure in-utero. Breast cancer risk assessment completed. No breast cancer risk calculated due to patient is less than 60 years old.  Risk Assessment     Risk Scores       05/21/2021   Last edited by: Demetrius Revel, LPN   5-year risk:     Lifetime risk:             A: BCCCP exam without pap smear Complaint of left breast lump and pain.  P: Referred patient to the [facility] for a diagnostic mammogram. Appointment scheduled Tuesday, May 21, 2021 at 1310.  Joy Parish, RN 05/21/2021 11:19 AM

## 2021-05-21 NOTE — Patient Instructions (Signed)
Explained breast self awareness with Lenice Llamas. Patient did not need a Pap smear today due to last Pap smear was 03/08/2019. Let her know BCCCP will cover Pap smears every 3 years unless has a history of abnormal Pap smears. Referred patient to the [facility] for a diagnostic mammogram. Appointment scheduled Tuesday, May 21, 2021 at 1310. Patient aware of appointment and will be there. Lenice Llamas verbalized understanding.  Lakeita Panther, Arvil Chaco, RN 11:19 AM

## 2021-06-03 ENCOUNTER — Ambulatory Visit: Payer: Self-pay | Admitting: Nurse Practitioner

## 2021-09-06 ENCOUNTER — Telehealth: Payer: Self-pay | Admitting: Nurse Practitioner

## 2021-09-06 DIAGNOSIS — E1065 Type 1 diabetes mellitus with hyperglycemia: Secondary | ICD-10-CM

## 2021-09-06 MED ORDER — FREESTYLE LIBRE 14 DAY READER DEVI: 6 refills | Status: DC

## 2021-09-06 MED ORDER — FREESTYLE LIBRE 14 DAY SENSOR MISC: 6 refills | Status: DC

## 2021-09-06 NOTE — Telephone Encounter (Signed)
Patient has a coupon and insurance for a free trial for a freestyle libre which expires in April, please send order to [facility] [address], patient scheduled for 11/08/2021 for a follow up

## 2021-09-06 NOTE — Telephone Encounter (Signed)
Pharmacist stated this kit / freestyle Elenor Legato is no longer available, please advise if another option is available.

## 2021-09-06 NOTE — Telephone Encounter (Signed)
Rxs sent

## 2021-09-09 MED ORDER — FREESTYLE LIBRE 2 SENSOR MISC: 3 refills | Status: DC

## 2021-09-09 MED ORDER — FREESTYLE LIBRE 2 READER DEVI: 3 refills | Status: DC

## 2021-09-09 NOTE — Addendum Note (Signed)
Addended by: Daisy Blossom, Annie Main L on: 09/09/2021 05:15 PM   Modules accepted: Orders

## 2021-09-09 NOTE — Telephone Encounter (Signed)
Rx sent for different [facility] [place] system.

## 2021-09-10 ENCOUNTER — Telehealth: Payer: Self-pay

## 2021-09-10 NOTE — Telephone Encounter (Signed)
Called pt made aware RX sent to pharmacy

## 2021-09-10 NOTE — Telephone Encounter (Signed)
Copied from [place] [id number]. Topic: General - Other >> Sep 10, 2021  3:40 PM Tessa Lerner A wrote: Reason for CRM: The patient has called to request that a prescription for the [facility] 3 (three - this must be expressly stated)   The patient has a coupon for the meter and would like to be able to pick it up today, they are currently at their [place], [place] [address] [place] [address] Phone: [phone] Fax: [phone] Hours: Not open 24 hours   Please contact further if needed

## 2021-09-11 MED ORDER — FREESTYLE LIBRE 3 SENSOR MISC: 1.0000 | Freq: Three times a day (TID) | 3 refills | Status: DC

## 2021-09-11 NOTE — Addendum Note (Signed)
Addended by: Abbie Sons L on: 09/11/2021 09:37 AM   Modules accepted: Orders

## 2021-09-11 NOTE — Telephone Encounter (Signed)
Called pt left VM RX sent to pharmacy

## 2021-09-11 NOTE — Telephone Encounter (Signed)
Rx sent for the [facility] 3 system.

## 2021-09-11 NOTE — Telephone Encounter (Signed)
Rx sent for [facility] 3 sent to requested pharmacy. Pt called with no answer. VM left informing her of this.

## 2021-09-12 DIAGNOSIS — D225 Melanocytic nevi of trunk: Secondary | ICD-10-CM | POA: Diagnosis not present

## 2021-09-12 DIAGNOSIS — L298 Other pruritus: Secondary | ICD-10-CM | POA: Diagnosis not present

## 2021-09-12 DIAGNOSIS — D485 Neoplasm of uncertain behavior of skin: Secondary | ICD-10-CM | POA: Diagnosis not present

## 2021-09-13 ENCOUNTER — Other Ambulatory Visit: Payer: Self-pay

## 2021-09-13 ENCOUNTER — Emergency Department (HOSPITAL_COMMUNITY): Payer: 59

## 2021-09-13 ENCOUNTER — Encounter (HOSPITAL_COMMUNITY): Payer: Self-pay

## 2021-09-13 ENCOUNTER — Telehealth: Payer: Self-pay | Admitting: Family Medicine

## 2021-09-13 ENCOUNTER — Other Ambulatory Visit: Payer: Self-pay | Admitting: Pharmacist

## 2021-09-13 ENCOUNTER — Emergency Department (HOSPITAL_COMMUNITY)
Admission: EM | Admit: 2021-09-13 | Discharge: 2021-09-14 | Disposition: A | Discharge status: Home / Self Care | Payer: 59 | Attending: Emergency Medicine | Admitting: Emergency Medicine

## 2021-09-13 DIAGNOSIS — E109 Type 1 diabetes mellitus without complications: Secondary | ICD-10-CM | POA: Diagnosis not present

## 2021-09-13 DIAGNOSIS — Z794 Long term (current) use of insulin: Secondary | ICD-10-CM | POA: Insufficient documentation

## 2021-09-13 DIAGNOSIS — N9489 Other specified conditions associated with female genital organs and menstrual cycle: Secondary | ICD-10-CM | POA: Diagnosis not present

## 2021-09-13 DIAGNOSIS — R519 Headache, unspecified: Secondary | ICD-10-CM | POA: Insufficient documentation

## 2021-09-13 DIAGNOSIS — E1065 Type 1 diabetes mellitus with hyperglycemia: Secondary | ICD-10-CM

## 2021-09-13 LAB — I-STAT BETA HCG BLOOD, ED (MC, WL, AP ONLY): I-stat hCG, quantitative: <5 m[IU]/mL (ref <5)

## 2021-09-13 LAB — CBC WITH DIFFERENTIAL/PLATELET
Abs Immature Granulocytes: 0.01 10*3/uL (ref 0.00–0.07)
Basophils Absolute: 0.1 10*3/uL (ref 0.0–0.1)
Basophils Relative: 1 %
Eosinophils Absolute: 0.1 10*3/uL (ref 0.0–0.5)
Eosinophils Relative: 2 %
HCT: 38.2 % (ref 36.0–46.0)
Hemoglobin: 12 g/dL (ref 12.0–15.0)
Immature Granulocytes: 0 %
Lymphocytes Relative: 36 %
Lymphs Abs: 1.9 10*3/uL (ref 0.7–4.0)
MCH: 26.9 pg (ref 26.0–34.0)
MCHC: 31.4 g/dL (ref 30.0–36.0)
MCV: 85.7 fL (ref 80.0–100.0)
Monocytes Absolute: 0.6 10*3/uL (ref 0.1–1.0)
Monocytes Relative: 12 %
Neutro Abs: 2.5 10*3/uL (ref 1.7–7.7)
Neutrophils Relative %: 49 %
Platelets: 330 10*3/uL (ref 150–400)
RBC: 4.46 MIL/uL (ref 3.87–5.11)
RDW: 13.8 % (ref 11.5–15.5)
WBC: 5.2 10*3/uL (ref 4.0–10.5)
nRBC: 0 % (ref 0.0–0.2)

## 2021-09-13 LAB — BASIC METABOLIC PANEL
Anion gap: 8 (ref 5–15)
BUN: 14 mg/dL (ref 6–20)
CO2: 24 mmol/L (ref 22–32)
Calcium: 8.9 mg/dL (ref 8.9–10.3)
Chloride: 103 mmol/L (ref 98–111)
Creatinine, Ser: 0.58 mg/dL (ref 0.44–1.00)
GFR, Estimated: >60 mL/min (ref >60)
Glucose, Bld: 139 mg/dL — ABNORMAL HIGH (ref 70–99)
Potassium: 4.3 mmol/L (ref 3.5–5.1)
Sodium: 135 mmol/L (ref 135–145)

## 2021-09-13 MED ORDER — INSULIN LISPRO (1 UNIT DIAL) 100 UNIT/ML (KWIKPEN): PEN_INJECTOR | SUBCUTANEOUS | 0 refills | Status: DC

## 2021-09-13 MED ORDER — METOCLOPRAMIDE HCL 5 MG/ML IJ SOLN
10.0000 mg | Freq: Once | INTRAMUSCULAR | Status: AC
  Administered 2021-09-13: 10 mg via INTRAVENOUS
  Filled 2021-09-13: qty 2

## 2021-09-13 MED ORDER — DEXAMETHASONE SODIUM PHOSPHATE 10 MG/ML IJ SOLN
10.0000 mg | Freq: Once | INTRAMUSCULAR | Status: AC
  Administered 2021-09-13: 10 mg via INTRAVENOUS
  Filled 2021-09-13: qty 1

## 2021-09-13 MED ORDER — IBUPROFEN 200 MG PO TABS
400.0000 mg | ORAL_TABLET | Freq: Once | ORAL | Status: AC | PRN
  Administered 2021-09-13: 400 mg via ORAL
  Filled 2021-09-13: qty 2

## 2021-09-13 MED ORDER — KETOROLAC TROMETHAMINE 15 MG/ML IJ SOLN
15.0000 mg | Freq: Once | INTRAMUSCULAR | Status: AC
  Administered 2021-09-13: 15 mg via INTRAVENOUS
  Filled 2021-09-13: qty 1

## 2021-09-13 MED ORDER — SODIUM CHLORIDE 0.9 % IV BOLUS
1000.0000 mL | Freq: Once | INTRAVENOUS | Status: AC
  Administered 2021-09-13: 1000 mL via INTRAVENOUS

## 2021-09-13 MED ORDER — DIPHENHYDRAMINE HCL 50 MG/ML IJ SOLN
12.5000 mg | Freq: Once | INTRAMUSCULAR | Status: AC
  Administered 2021-09-13: 12.5 mg via INTRAVENOUS
  Filled 2021-09-13: qty 1

## 2021-09-13 NOTE — ED Notes (Signed)
Unsuccessful IV attempt x2.  

## 2021-09-13 NOTE — ED Provider Triage Note (Signed)
Emergency Medicine Provider Triage Evaluation Note  [name] , a 36 y.o. female  was evaluated in triage.  Pt complains of right sided headache x2-3 months located in the temple region. Patient notes headache has been constant for the past 2-3 months, but waxes and wanes in intensity.  Denies head injury.  Denies visual changes, speech changes, and unilateral weakness.  No dizziness.  Denies nausea and vomiting.  She is not currently on blood thinners.  Patient notes she occasionally has migraines however, has never been diagnosed with migraine. No history of cancer. Denies fever.  Review of Systems  Positive: headache Negative: Visual changes  Physical Exam  BP (!) 138/93 (BP Location: Left Arm)    Pulse 100    Temp 98.3 F (36.8 C) (Oral)    Resp 18    Ht 5\' 6"  (1.676 m)    Wt 70.8 kg    LMP 08/18/2021 (Exact Date)    SpO2 100%    BMI 25.18 kg/m  Gen:   Awake, no distress   Resp:  Normal effort  MSK:   Moves extremities without difficulty  Other:  No focal deficits  Medical Decision Making  Medically screening exam initiated at 3:28 PM.  Appropriate orders placed.  [name] was informed that the remainder of the evaluation will be completed by another provider, this initial triage assessment does not replace that evaluation, and the importance of remaining in the ED until their evaluation is complete.  Given longevity of headache, CT head to rule out any abnormalities Patient will most likely need migraine cocktail   Suzy Bouchard, PA-C 09/13/21 1530

## 2021-09-13 NOTE — ED Triage Notes (Signed)
Patient reports pain in the right temporal area x 2 months. Patient states the headache is constant and varies on degree of pain. Patient denies any head injury or being on blood thinners, change in vision. Patent denies light sensitivity, N/V.

## 2021-09-13 NOTE — ED Notes (Signed)
Patient is waiting on fluids to finish.

## 2021-09-13 NOTE — Discharge Instructions (Addendum)
Schedule follow-up with [facility] neurology, call Monday morning.  Take Tylenol Motrin as needed for the headache at home.  Keep a diary of symptoms including what you are doing, any triggers that you notice and anything it makes it better or worse.  Check on MyChart for the results of your lab work.  I also recommend calling your primary and letting her know you are seen in the ED in case she wants to see you for reevaluation this upcoming week.

## 2021-09-13 NOTE — ED Provider Notes (Signed)
[name] DEPT Provider Note   CSN: [id number] Arrival date & time: 09/13/21  1439     History  Chief Complaint  Patient presents with   Headache    [name] is a 36 y.o. female.   Headache  Patient with history of type 1 diabetes presents due to right-sided headache x3 to 4 months.  Feels it primarily in the temple region, states it is constant but the intensity changes.   Is not worsened with exercise.  No associated nausea, vomiting, vision changes, lateralized symptoms, vomiting, nausea.  No family history of aneurysms.  Home Medications Prior to Admission medications   Medication Sig Start Date End Date Taking? Authorizing Provider  atorvastatin (LIPITOR) 10 MG tablet TAKE 1 TABLET (10 MG TOTAL) BY MOUTH DAILY. Patient taking differently: Take 10 mg by mouth daily. 03/01/21 03/01/22 Yes Gildardo Pounds, NP  carboxymethylcellulose (REFRESH PLUS) 0.5 % SOLN Place 1 drop into both eyes daily as needed (dry eyes).   Yes [provider]  clindamycin (CLEOCIN T) 1 % external solution Apply 1 application topically at bedtime. 06/27/21  Yes [provider]  hydroquinone 4 % cream Apply 1 application topically at bedtime.   Yes [provider]  hydrOXYzine (ATARAX/VISTARIL) 50 MG tablet TAKE 1 TABLET (50 MG TOTAL) BY MOUTH 3 (THREE) TIMES DAILY AS NEEDED FOR ANXIETY. Patient taking differently: Take 50 mg by mouth 3 (three) times daily as needed for anxiety or itching. 03/01/21 03/01/22 Yes Gildardo Pounds, NP  Insulin Glargine (BASAGLAR KWIKPEN) 100 UNIT/ML INJECT 16 UNITS UNDER THE SKIN ONCE DAILY. Patient taking differently: 16 Units daily. 03/01/21 03/01/22 Yes Gildardo Pounds, NP  insulin lispro (HUMALOG) 100 UNIT/ML KwikPen Inject 7 units into the skin three times per day. For blood sugars 0-150 give 0 units of insulin, 151-200 give 2 units of insulin, 201-250 give 4 units, 251-300 give 6 units, 301-350 give 8 units,  351-400 give 10 units,> 400 give 12 units and call M.D. Discussed hypoglycemia protocol. Patient taking differently: 0-12 Units 3 (three) times daily. Inject 7 units into the skin three times per day. For blood sugars 0-150 give 0 units of insulin, 151-200 give 2 units of insulin, 201-250 give 4 units, 251-300 give 6 units, 301-350 give 8 units, 351-400 give 10 units,> 400 give 12 units and call M.D. Discussed hypoglycemia protocol. (As per patient 1 units per 15 carbs- sliding scale) 09/13/21  Yes Newlin, Enobong, MD  topiramate (TOPAMAX) 25 MG tablet TAKE 1 TABLET (25 MG TOTAL) BY MOUTH DAILY. Patient taking differently: Take 25 mg by mouth daily as needed (headache). 03/01/21 03/01/22 Yes Gildardo Pounds, NP  tretinoin (RETIN-A) 0.1 % cream Apply 1 application topically at bedtime.   Yes [provider]  bacitracin ointment Apply 1 application topically 2 (two) times daily. Patient not taking: Reported on 03/01/2021 01/10/21   Jaynee Eagles, PA-C  Blood Glucose Monitoring Suppl (TRUE METRIX METER) w/Device KIT Use as directed 4 (four) times daily -  with meals and at bedtime. 03/01/21   Gildardo Pounds, NP  Continuous Blood Gluc Receiver (FREESTYLE LIBRE 2 READER) DEVI Use to monitor blood sugar three times daily.Dx: E10.65 09/09/21   Charlott Rakes, MD  Continuous Blood Gluc Sensor (FREESTYLE LIBRE 3 SENSOR) MISC 1 each by Does not apply route 3 (three) times daily. Use to monitor blood sugar three times daily.Dx: E10.65 09/11/21   Charlott Rakes, MD  glucose blood test strip Use as instructed four  times daily. 03/01/21   Gildardo Pounds, NP  Insulin Pen Needle 31G X 5 MM MISC USE AS INSTRUCTED. INJECT INTO THE SKIN THREE TIMES DAILY 03/01/21 03/01/22  Gildardo Pounds, NP  Lancet Device MISC Use 4 times daily after meals and at bedtime. 03/01/21   Gildardo Pounds, NP  naproxen (NAPROSYN) 375 MG tablet Take 1 tablet (375 mg total) by mouth 2 (two) times daily with a meal. Patient not taking: Reported on  09/13/2021 01/10/21   Jaynee Eagles, PA-C  TRUEplus Lancets 28G MISC USE AS INSTRUCTED FOUR TIMES DAILY 03/01/21 03/01/22  Gildardo Pounds, NP      Allergies    Lexapro [escitalopram oxalate]    Review of Systems   Review of Systems  Neurological:  Positive for headaches.   Physical Exam Updated Vital Signs BP 134/88 (BP Location: Left Arm)    Pulse 88    Temp 98.3 F (36.8 C) (Oral)    Resp 16    Ht 5' 6"  (1.676 m)    Wt 70.8 kg    LMP 08/18/2021 (Exact Date)    SpO2 100%    BMI 25.18 kg/m  Physical Exam Vitals and nursing note reviewed. Exam conducted with a chaperone present.  Constitutional:      General: She is not in acute distress.    Appearance: Normal appearance.  HENT:     Head: Normocephalic and atraumatic.  Eyes:     General: No scleral icterus.    Extraocular Movements: Extraocular movements intact.     Right eye: Normal extraocular motion and no nystagmus.     Left eye: Normal extraocular motion and no nystagmus.     Pupils: Pupils are equal, round, and reactive to light.  Neck:     Meningeal: Brudzinski's sign and Kernig's sign absent.  Musculoskeletal:     Cervical back: Normal range of motion.  Skin:    Coloration: Skin is not jaundiced.  Neurological:     Mental Status: She is alert and oriented to person, place, and time. Mental status is at baseline.     GCS: GCS eye subscore is 4. GCS verbal subscore is 5. GCS motor subscore is 6.     Cranial Nerves: No cranial nerve deficit.     Sensory: No sensory deficit.     Coordination: Coordination normal.     Gait: Gait normal.  Psychiatric:        Mood and Affect: Mood normal.    ED Results / Procedures / Treatments   Labs (all labs ordered are listed, but only abnormal results are displayed) Labs Reviewed  BASIC METABOLIC PANEL  CBC WITH DIFFERENTIAL/PLATELET  SEDIMENTATION RATE  C-REACTIVE PROTEIN  I-STAT BETA HCG BLOOD, ED ([facility], [facility], [facility] ONLY)    EKG None  Radiology CT Head Wo Contrast  Result  Date: 09/13/2021 CLINICAL DATA:  Headache, sudden, severe EXAM: CT HEAD WITHOUT CONTRAST TECHNIQUE: Contiguous axial images were obtained from the base of the skull through the vertex without intravenous contrast. RADIATION DOSE REDUCTION: This exam was performed according to the departmental dose-optimization program which includes automated exposure control, adjustment of the mA and/or kV according to patient size and/or use of iterative reconstruction technique. COMPARISON:  04/25/2016. FINDINGS: Brain: No evidence of acute infarction, hemorrhage, hydrocephalus, extra-axial collection or mass lesion/mass effect. Vascular: No hyperdense vessel identified. Skull: No acute fracture. Sinuses/Orbits: Visualized sinuses are clear. Visualized orbits are unremarkable. Other: No mastoid effusions. IMPRESSION: No evidence of acute intracranial abnormality. Electronically Signed  By: Margaretha Sheffield M.D.   On: 09/13/2021 15:43    Procedures Procedures    Medications Ordered in ED Medications  dexamethasone (DECADRON) injection 10 mg (has no administration in time range)  ibuprofen (ADVIL) tablet 400 mg (400 mg Oral Given 09/13/21 1948)  ketorolac (TORADOL) 15 MG/ML injection 15 mg (15 mg Intravenous Given 09/13/21 2158)  metoCLOPramide (REGLAN) injection 10 mg (10 mg Intravenous Given 09/13/21 2158)  diphenhydrAMINE (BENADRYL) injection 12.5 mg (12.5 mg Intravenous Given 09/13/21 2157)  sodium chloride 0.9 % bolus 1,000 mL (1,000 mLs Intravenous New Bag/Given 09/13/21 2206)    ED Course/ Medical Decision Making/ A&P                           Medical Decision Making Amount and/or Complexity of Data Reviewed Labs: ordered.  Risk OTC drugs. Prescription drug management.   This patient presents to the ED for concern of headache, this involves an extensive number of treatment options, and is a complaint that carries with it a high risk of complications and morbidity.  The differential diagnosis includes  migraine, benign headache, intracranial mass, intracranial bleed, temporal arteritis, CVA, aneurysm, other   Co morbidities that complicate the patient evaluation: DMT1   Additional history obtained: -External records from outside source obtained and reviewed including: Chart review including previous notes, labs, imaging, consultation notes   Lab Tests: -I ordered, reviewed, and interpreted labs.  The pertinent results include: Initiated ordering C-reactive been sed rate as well as basic labs given she has not had any since 2021 in order to help facilitate follow-up with the neurologist.  Labs are pending at discharge.  Patient will follow up on MyChart.   Imaging Studies ordered: -I ordered imaging studies including CT head -I independently visualized and interpreted imaging which showed no acute process -I agree with the radiologist interpretation   Medicines ordered and prescription drug management: -I ordered medication including   for liter bolus, Reglan, Toradol, Benadryl, decadron   ED Course: Stable vitals, no hypoxia or tachycardia.  There were no focal deficits on neuro exam, given the longevity of the symptoms that makes an acute emergent vascular process seem less likely.  Additionally her age and lack of risk factors makes CVA unlikely especially in the context of no lateralizing symptoms.  GCA is a consideration but I think less likely given her age.  Presentation is not consistent with trigeminal neuralgia.  CT does not show any evidence of intracranial bleed or intracranial mass.  I considered possible CTA of head and neck, however given the last vascular component in the history, longevity of her symptoms,  I do not feel this needs to be done emergently.   Discussed with patient given the constant and prolonged nature of the symptoms she should follow-up with neurology.  Given information for outpatient neurology follow-up, will order labs and ESR CRP to help facilitate  further work-up in the outpatient setting.   Reevaluation: After the interventions noted above, I reevaluated the patient and found that they have :improved   Dispostion: Neurology follow-up in the outpatient setting.         Final Clinical Impression(s) / ED Diagnoses Final diagnoses:  None    Rx / DC Orders ED Discharge Orders     None         Sherrill Raring, PA-C 09/13/21 [place], [place], DO 09/13/21 2238

## 2021-09-14 ENCOUNTER — Other Ambulatory Visit: Payer: Self-pay | Admitting: Family Medicine

## 2021-09-14 DIAGNOSIS — E1065 Type 1 diabetes mellitus with hyperglycemia: Secondary | ICD-10-CM

## 2021-09-14 LAB — SEDIMENTATION RATE: Sed Rate: 9 mm/hr (ref 0–22)

## 2021-09-14 LAB — C-REACTIVE PROTEIN: CRP: <0.5 mg/dL (ref <1.0)

## 2021-09-14 NOTE — Telephone Encounter (Signed)
Refusing due to duplicate request. Requested Prescriptions  Pending Prescriptions Disp Refills   insulin lispro (HUMALOG) 100 UNIT/ML KwikPen [Pharmacy Med Name: INSULIN LISPRO 100 UNIT/ML PEN]  0    Sig: Inject 7 units into the skin three times per day. For blood sugars 0-150 give 0 units of insulin, 151-200 give 2 units of insulin, 201-250 give 4 units, 251-300 give 6 units, 301-350 give 8 units, 351-400 give 10 units,> 400 give 12 units and call M.D. Discussed hypoglycemia protocol.     Endocrinology:  Diabetes - Insulins Failed - 09/13/2021  4:14 PM      Failed - HBA1C is between 0 and 7.9 and within 180 days    HbA1c, POC (controlled diabetic range)  Date Value Ref Range Status  03/01/2021 10.1 (A) 0.0 - 7.0 % Final          Failed - Valid encounter within last 6 months    Recent Outpatient Visits           6 months ago Type 1 diabetes mellitus with hyperglycemia [facility])   [place], [place] W, NP   10 months ago Type 1 diabetes mellitus with hyperglycemia [facility])   [place], Vernia Buff, NP   1 year ago Encounter for annual physical exam   [place], Vernia Buff, NP   2 years ago Anxiety and depression   [place], Vernia Buff, NP   2 years ago Well woman exam with routine gynecological exam   [place] Gildardo Pounds, NP       Future Appointments             In 1 month Gildardo Pounds, NP [place]   In 5 months Brendolyn Patty, MD [place]

## 2021-09-14 NOTE — Telephone Encounter (Signed)
Refusing due to duplicate request. Requested Prescriptions  Pending Prescriptions Disp Refills   insulin lispro (HUMALOG) 100 UNIT/ML KwikPen [Pharmacy Med Name: INSULIN LISPRO 100 UNIT/ML PEN]  0    Sig: INJECT 7 UNITS INTO THE SKIN THREE TIMES PER DAY. FOR BLOOD SUGARS 0-150 GIVE 0 UNITS OF INSULIN, 151-200 GIVE 2 UNITS OF INSULIN, 201-250 GIVE 4 UNITS, 251-300 GIVE 6 UNITS, 301-350 GIVE 8 UNITS, 351-400 GIVE 10 UNITS,> 400 GIVE 12 UNITS AND CALL M.D. [place].     Endocrinology:  Diabetes - Insulins Failed - 09/13/2021  4:14 PM      Failed - HBA1C is between 0 and 7.9 and within 180 days    HbA1c, POC (controlled diabetic range)  Date Value Ref Range Status  03/01/2021 10.1 (A) 0.0 - 7.0 % Final         Failed - Valid encounter within last 6 months    Recent Outpatient Visits          6 months ago Type 1 diabetes mellitus with hyperglycemia [facility])   [place], [place] W, NP   10 months ago Type 1 diabetes mellitus with hyperglycemia [facility])   [place], Vernia Buff, NP   1 year ago Encounter for annual physical exam   [place], Vernia Buff, NP   2 years ago Anxiety and depression   [place], Vernia Buff, NP   2 years ago Well woman exam with routine gynecological exam   [place] Gildardo Pounds, NP      Future Appointments            In 1 month Gildardo Pounds, NP [place]   In 5 months Brendolyn Patty, MD [place]

## 2021-09-16 ENCOUNTER — Telehealth: Payer: Self-pay | Admitting: Nurse Practitioner

## 2021-09-16 ENCOUNTER — Other Ambulatory Visit: Payer: Self-pay

## 2021-09-16 NOTE — Telephone Encounter (Signed)
° °  Requested medication (s) are on the active medication list: yes   Future visit scheduled: 11/08/21  Notes to clinic:  Pharm sent request as follows:  Pharmacy comment: Alternative Requested:PATIENT SAYS INSURANCE PREFERS NOVOLOG.     Requested Prescriptions  Pending Prescriptions Disp Refills   insulin lispro (HUMALOG) 100 UNIT/ML KwikPen [Pharmacy Med Name: INSULIN LISPRO 100 UNIT/ML PEN]  0    Sig: Inject 7 units into the skin three times per day. For blood sugars 0-150 give 0 units of insulin, 151-200 give 2 units of insulin, 201-250 give 4 units, 251-300 give 6 units, 301-350 give 8 units, 351-400 give 10 units,> 400 give 12 units and call M.D. Discussed hypoglycemia protocol.     Endocrinology:  Diabetes - Insulins Failed - 09/14/2021  3:04 PM      Failed - HBA1C is between 0 and 7.9 and within 180 days    HbA1c, POC (controlled diabetic range)  Date Value Ref Range Status  03/01/2021 10.1 (A) 0.0 - 7.0 % Final          Failed - Valid encounter within last 6 months    Recent Outpatient Visits           6 months ago Type 1 diabetes mellitus with hyperglycemia [facility])   [place], [place] W, NP   10 months ago Type 1 diabetes mellitus with hyperglycemia [facility])   [place], Vernia Buff, NP   1 year ago Encounter for annual physical exam   [place], Vernia Buff, NP   2 years ago Anxiety and depression   [place], Vernia Buff, NP   2 years ago Well woman exam with routine gynecological exam   [place] Gildardo Pounds, NP       Future Appointments             In 1 month Gildardo Pounds, NP [place]   In 5 months Brendolyn Patty, MD [place]

## 2021-09-16 NOTE — Telephone Encounter (Signed)
Duplicate request, copied to encounter 09/14/21 and routed to provider.

## 2021-09-16 NOTE — Telephone Encounter (Signed)
Pt called in stating if she could switch back to the insulin she had been taking before from insulin lispro (HUMALOG) 100 UNIT/ML KwikPen to going back to Insulin aspart (novoLOG) injection 0-9 Units  [id number] - [place], [place] [address]  Phone:  [phone] Fax:  [phone]

## 2021-09-16 NOTE — Telephone Encounter (Signed)
[name] N Telephone Encounter Signed Creation Time:  09/16/2021 11:59 AM   Signed      Pt called in stating if she could switch back to the insulin she had been taking before from insulin lispro (HUMALOG) 100 UNIT/ML KwikPen to going back to Insulin aspart (novoLOG) injection 0-9 Units   [id number] - [place], [place] [address]           Phone:             [phone] Fax:     [phone]

## 2021-09-17 MED ORDER — NOVOLOG FLEXPEN 100 UNIT/ML ~~LOC~~ SOPN
PEN_INJECTOR | SUBCUTANEOUS | 2 refills | Status: DC
Start: 2021-09-17 — End: 2022-02-05

## 2021-09-19 NOTE — Telephone Encounter (Signed)
Patient called asking the status of the medication switch that she had ask about earlier in the week.  She has not heard anything back  CB# [phone]

## 2021-09-20 ENCOUNTER — Telehealth: Payer: Self-pay

## 2021-09-20 NOTE — Telephone Encounter (Signed)
Copied from [place] [id number]. Topic: General - Other >> Sep 20, 2021  1:03 PM [name] wrote: Reason for CRM: The patient would like to speak with [name] member of clinical staff when possible about changing their insulin medications  The patient has been notified that their insurance would prefer for the patient to be prescribed Novolog rather than Humalog  Patient has reached the practice previously regarding this concern and would like an update on what their new prescription will be  Please contact patient further when possible

## 2021-09-23 ENCOUNTER — Telehealth: Payer: Self-pay

## 2021-09-23 ENCOUNTER — Other Ambulatory Visit: Payer: Self-pay | Admitting: Nurse Practitioner

## 2021-09-23 NOTE — Telephone Encounter (Signed)
Left message to return call to our office.  

## 2021-09-23 NOTE — Telephone Encounter (Signed)
Novolog sent to pharmacy on 09-17-2021

## 2021-10-10 NOTE — Progress Notes (Signed)
? ?Referring:  ?Gildardo Pounds, NP ?[facility] ?[address] ?[place],  [place] [address] ? ?PCP: ?Gildardo Pounds, NP ? ?Neurology was asked to evaluate Aldona Bryner, a 36 year old female for a chief complaint of headaches.  Our recommendations of care will be communicated by shared medical record.   ? ?CC:  headaches ? ?HPI:  ?Medical co-morbidities: DM1, migraines ? ?The patient presents for evaluation of right sided headaches which began 2-3 months ago. No clear inciting events that triggered this headache. Headache is constant but fluctuates in severity. She has baseline 4/10 pain which can get up to 8/10 at its worst. There is no associated photophobia, phonophobia, nausea, or vision change. No clear triggers and there is no positional component. She takes Tylenol once every 2-3 weeks as needed which does help temporarily but pain always returns. States she had a dental implant on the right side in 2018 and wonders if that could be contributing. ? ?She has a history of migraines and previously took Topamax. She also had a history of chronic neck and back pain. ? ?[facility] 09/13/21 was unremarkable. ? ?Headache History: ?Onset: 2-3 months ago ?Triggers: none ?Aura: none ?Location: right temple ?Quality/Description: dull aching ?Severity: 4/10-8/10 ?Associated Symptoms: ? Photophobia: no ? Phonophobia: no ? Nausea: no ?Vomiting: no ?Other symptoms: no ipsilateral autonomic symptoms ?Worse with activity?: occasionally ?Duration of headaches: constant ? ? ?Headache days per month: 30 ?Headache free days per month: 0 ? ?Current Treatment: ?Abortive ?Tylenol ? ?Preventative ?none ? ?Prior Therapies                                 ?Topamax 25 mg daily ? ? ?LABS: ?09/13/21: ESR, CRP, CBC, BMP wnl ? ?IMAGING:  ?[facility] 09/13/21: unremarkable ? ?Imaging independently reviewed on October 11, 2021  ? ?Current Outpatient Medications on File Prior to Visit  ?Medication Sig Dispense Refill  ? atorvastatin (LIPITOR) 10 MG tablet TAKE  1 TABLET (10 MG TOTAL) BY MOUTH DAILY. (Patient taking differently: Take 10 mg by mouth daily.) 90 tablet 3  ? Blood Glucose Monitoring Suppl (TRUE METRIX METER) w/Device KIT Use as directed 4 (four) times daily -  with meals and at bedtime. 1 kit 0  ? clindamycin (CLEOCIN T) 1 % external solution Apply 1 application topically at bedtime.    ? Continuous Blood Gluc Receiver (FREESTYLE LIBRE 2 READER) DEVI Use to monitor blood sugar three times daily.Dx: E10.65 1 each 3  ? Continuous Blood Gluc Sensor (FREESTYLE LIBRE 3 SENSOR) MISC 1 each by Does not apply route 3 (three) times daily. Use to monitor blood sugar three times daily.Dx: E10.65 1 each 3  ? glucose blood test strip Use as instructed four times daily. 100 each 12  ? hydroquinone 4 % cream Apply 1 application topically at bedtime.    ? hydrOXYzine (ATARAX/VISTARIL) 50 MG tablet TAKE 1 TABLET (50 MG TOTAL) BY MOUTH 3 (THREE) TIMES DAILY AS NEEDED FOR ANXIETY. (Patient taking differently: Take 50 mg by mouth 3 (three) times daily as needed for anxiety or itching.) 90 tablet 1  ? insulin aspart (NOVOLOG FLEXPEN) 100 UNIT/ML FlexPen Inject 7 units into the skin three times per day. For blood sugars 0-150 give 0 units of insulin, 151-200 give 2 units of insulin, 201-250 give 4 units, 251-300 give 6 units, 301-350 give 8 units, 351-400 give 10 units,> 400 give 12 units and call M.D. Discussed hypoglycemia  protocol 15 mL 2  ? Insulin Glargine (BASAGLAR KWIKPEN) 100 UNIT/ML INJECT 16 UNITS UNDER THE SKIN ONCE DAILY. (Patient taking differently: 16 Units daily.) 15 mL 6  ? Insulin Pen Needle 31G X 5 MM MISC USE AS INSTRUCTED. INJECT INTO THE SKIN THREE TIMES DAILY 100 each 2  ? Lancet Device MISC Use 4 times daily after meals and at bedtime. 1 each 12  ? naproxen (NAPROSYN) 375 MG tablet Take 1 tablet (375 mg total) by mouth 2 (two) times daily with a meal. 30 tablet 0  ? topiramate (TOPAMAX) 25 MG tablet TAKE 1 TABLET (25 MG TOTAL) BY MOUTH DAILY. (Patient taking  differently: Take 25 mg by mouth daily as needed (headache).) 30 tablet 1  ? tretinoin (RETIN-A) 0.1 % cream Apply 1 application topically at bedtime.    ? TRUEplus Lancets 28G MISC USE AS INSTRUCTED FOUR TIMES DAILY 100 each 3  ? carboxymethylcellulose (REFRESH PLUS) 0.5 % SOLN Place 1 drop into both eyes daily as needed (dry eyes). (Patient not taking: Reported on 10/11/2021)    ? ?No current facility-administered medications on file prior to visit.  ? ? ? ?Allergies: ?Allergies  ?Allergen Reactions  ? Lexapro [Escitalopram Oxalate]   ?  NUMBNESS  ? ? ?Family History: ?Migraine or other headaches in the family:  no ?Aneurysms in a first degree relative:  no ?Brain tumors in the family:  no ?Other neurological illness in the family:   no ? ?Past Medical History: ?Past Medical History:  ?Diagnosis Date  ? DKA, type 1 ([place])   ? Type 1 diabetes mellitus ([place])   ? ? ?Past Surgical History ?History reviewed. No pertinent surgical history. ? ?Social History: ?Social History  ? ?Tobacco Use  ? Smoking status: Never  ?  Passive exposure: Yes  ? Smokeless tobacco: Never  ?Vaping Use  ? Vaping Use: Never used  ?Substance Use Topics  ? Alcohol use: Yes  ?  Comment: occasionally   ? Drug use: No  ? ? ? ?ROS: ?Negative for fevers, chills. Positive for headaches. All other systems reviewed and negative unless stated otherwise in HPI. ? ? ?Physical Exam:  ? ?Vital Signs: ?BP 116/75   Pulse (!) 103   Ht _0  (1.676 m)   Wt 164 lb 6.4 oz (74.6 kg)   BMI 26.53 kg/m?  ?GENERAL: well appearing,in no acute distress,alert ?SKIN:  Color, texture, turgor normal. No rashes or lesions ?HEAD:  Normocephalic/atraumatic. ?CV:  RRR ?RESP: Normal respiratory effort ?MSK: +tenderness to palpation over bilateral occiput, neck, and shoulders (states she exercised yesterday) ? ?NEUROLOGICAL: ?Mental Status: Alert, oriented to person, place and time,Follows commands ?Cranial Nerves: PERRL, optic discs sharp OU, visual fields intact to  confrontation, extraocular movements intact, facial sensation intact, no facial droop or ptosis, hearing grossly intact, no dysarthria ?Motor: muscle strength 5/5 both upper and lower extremities ?Reflexes: 2+ throughout ?Sensation: intact to light touch all 4 extremities ?Coordination: Finger-to- nose-finger intact bilaterally ?Gait: normal-based ? ? ?IMPRESSION: ?36 year old female with a history of DM1, migraines who presents for evaluation of daily right-sided headaches for the past 2-3 months. [facility] was unremarkable. Headaches do not have associated migrainous or ipsilateral autonomic features. Will plan for indomethacin trial to rule out hemicrania continue as she does have constant unilateral headaches. 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 May consider MRI brain if headaches worsen or do not improve despite treatment. Encouraged her to follow up with her  dentist to evaluate her dental implant. ? ?PLAN: ?-Indomethacin trial: 25 mg TID x1 week, then 50 mg TID x1 week, then 75 mg TID x1 week ?-If no response to indomethacin consider gabapentin, TCA, or SNRI ?-Encouraged to follow up with dentist to evaluate her dental implant ?-Next steps: consider MRI brain if headaches persist.worsen despite treatment ? ?I spent a total of 33 minutes chart reviewing and counseling the patient. Headache education was done. Discussed treatment options including preventive medications. Discussed medication side effects, adverse reactions and drug interactions. Written educational materials and patient instructions outlining all of the above were given. ? ?Follow-up: 4 months ? ? ?Genia Harold, MD ?10/11/2021   ?10:44 AM ? ? ?

## 2021-10-11 ENCOUNTER — Encounter: Payer: Self-pay | Admitting: Psychiatry

## 2021-10-11 ENCOUNTER — Other Ambulatory Visit: Payer: Self-pay | Admitting: Psychiatry

## 2021-10-11 ENCOUNTER — Ambulatory Visit: Payer: 59 | Admitting: Psychiatry

## 2021-10-11 VITALS — BP 116/75 | HR 103 | Ht 66.0 in | Wt 164.4 lb

## 2021-10-11 DIAGNOSIS — G4452 New daily persistent headache (NDPH): Secondary | ICD-10-CM

## 2021-10-11 MED ORDER — INDOMETHACIN 25 MG PO CAPS: ORAL_CAPSULE | ORAL | 0 refills | Status: DC

## 2021-10-11 NOTE — Patient Instructions (Addendum)
Indomethacin trial to assess for hemicrania continua (constant headache on one side of the head) ? ?Week  1:  ?-Please obtain generic omeprazole (prilosec 20 mg) [[employer], [employer], [employer], etc] and start one daily. You may increase to twice a day if necessary. ?-Please start indomethacin (indocin) 25 mg (one capsule) 3 times per day with meals. ?-If, in one week, you are headache-free, this is your dose, stay on it. ?-If, in one week, you are tolerating the indocin, but have headache, then increase the dose as below. ?Week 2: ?-Please increase indocin to 50 mg (2 capsules) 3 times per day with meals. ?-If, in one week, you are headache-free, this is your dose, stay on it. ?-If, in one week, you are tolerating the indocin, but have headache, then increase the dose as below. ?Week 3:  ?-Please increase indocin to 75 mg (3 capsules) 3 times per day with meals. ?-If, in one week, you are headache-free, this is your dose, stay on it. ?-If, in one week, you have partial relief, call or see her neurologist. ?-If, in one week, you have no relief, stop the indocin. ? ?Please avoid NSAIDs while on this medication (ibuprofen, aleve, etc) ?

## 2021-10-14 NOTE — Telephone Encounter (Signed)
She needs indomethacin specifically for a diagnostic trial of this specific type of headache (hemicrania continua). We can appeal it if needed

## 2021-10-15 ENCOUNTER — Telehealth: Payer: Self-pay | Admitting: *Deleted

## 2021-10-15 NOTE — Telephone Encounter (Signed)
Received email re: documents are attached,PA sent to plan. Your information has been submitted to [facility].  ?If [employer] has not responded to your request within 24 hours, contact [employer] at [phone] ?

## 2021-10-15 NOTE — Telephone Encounter (Signed)
Indomethacin PA, key [id number], G44.51. faxed office notes to be attached.  ?

## 2021-10-16 ENCOUNTER — Encounter: Payer: Self-pay | Admitting: Psychiatry

## 2021-10-16 ENCOUNTER — Encounter: Payer: Self-pay | Admitting: *Deleted

## 2021-10-16 ENCOUNTER — Other Ambulatory Visit: Payer: Self-pay | Admitting: Psychiatry

## 2021-10-16 ENCOUNTER — Other Ambulatory Visit: Payer: Self-pay

## 2021-10-16 MED ORDER — AMITRIPTYLINE HCL 25 MG PO TABS
ORAL_TABLET | ORAL | 3 refills | Status: DC
  Filled 2021-10-16: qty 30, 30d supply, fill #0

## 2021-10-16 NOTE — Telephone Encounter (Signed)
Indomethacin denied: ? ?Coverage for this medication is denied for the following reason(s). We reviewed the information we received about your condition and circumstances. We used the plan approved policy when making this ?decision. The policy states that this medication may be approved when: ?- The member has a clinical condition or needs a specific dosage form for which there is no alternative on the formulary OR ?- The listed formulary alternatives are not recommended based on published guidelines or clinical literature OR ?- The formulary alternatives will likely be ineffective or less effective for the member OR ?- The formulary alternatives will likely cause an adverse effect OR ?- The member is unable to take the required number of formulary alternatives for the given diagnosis due to a trial and inadequate treatment response or contraindication OR ?- The member has tried and failed the required number of formulary alternatives. ? ?Based on the policy and the information we have, your request is denied. We did not receive any documentation that you meet any of the criteria outlined above. ? ?Formulary alternative(s) are diclofenac sodium dr, etodolac, etodolac er, flurbiprofen, ibuprofen, meloxicam, naproxen, oxaprozin, piroxicam, sulindac. Requirement: 3 in a class with 3 or more alternatives, 2 in a class with 2 alternatives, or 1 in a class with only 1 alternative. ? ?Sent to MD. ?

## 2021-10-16 NOTE — Telephone Encounter (Signed)
I'm going to send in an rx for amitriptyline instead, thanks

## 2021-10-23 ENCOUNTER — Other Ambulatory Visit: Payer: Self-pay

## 2021-11-05 DIAGNOSIS — R208 Other disturbances of skin sensation: Secondary | ICD-10-CM | POA: Diagnosis not present

## 2021-11-05 DIAGNOSIS — D235 Other benign neoplasm of skin of trunk: Secondary | ICD-10-CM | POA: Diagnosis not present

## 2021-11-05 DIAGNOSIS — D485 Neoplasm of uncertain behavior of skin: Secondary | ICD-10-CM | POA: Diagnosis not present

## 2021-11-05 DIAGNOSIS — D170 Benign lipomatous neoplasm of skin and subcutaneous tissue of head, face and neck: Secondary | ICD-10-CM | POA: Diagnosis not present

## 2021-11-05 DIAGNOSIS — D171 Benign lipomatous neoplasm of skin and subcutaneous tissue of trunk: Secondary | ICD-10-CM | POA: Diagnosis not present

## 2021-11-05 DIAGNOSIS — L538 Other specified erythematous conditions: Secondary | ICD-10-CM | POA: Diagnosis not present

## 2021-11-08 ENCOUNTER — Ambulatory Visit: Payer: 59 | Attending: Nurse Practitioner | Admitting: Nurse Practitioner

## 2021-11-08 ENCOUNTER — Encounter: Payer: Self-pay | Admitting: Nurse Practitioner

## 2021-11-08 VITALS — BP 126/85 | HR 94 | Wt 166.2 lb

## 2021-11-08 DIAGNOSIS — E1065 Type 1 diabetes mellitus with hyperglycemia: Secondary | ICD-10-CM

## 2021-11-08 DIAGNOSIS — D1722 Benign lipomatous neoplasm of skin and subcutaneous tissue of left arm: Secondary | ICD-10-CM

## 2021-11-08 DIAGNOSIS — N83201 Unspecified ovarian cyst, right side: Secondary | ICD-10-CM

## 2021-11-08 DIAGNOSIS — E785 Hyperlipidemia, unspecified: Secondary | ICD-10-CM | POA: Diagnosis not present

## 2021-11-08 DIAGNOSIS — Z3189 Encounter for other procreative management: Secondary | ICD-10-CM | POA: Diagnosis not present

## 2021-11-08 LAB — POCT GLYCOSYLATED HEMOGLOBIN (HGB A1C): HbA1c, POC (controlled diabetic range): 9.8 % — AB (ref 0.0–7.0)

## 2021-11-08 LAB — GLUCOSE, POCT (MANUAL RESULT ENTRY)
POC Glucose: 49 mg/dl — AB (ref 70–99)
POC Glucose: 79 mg/dl (ref 70–99)

## 2021-11-08 NOTE — Progress Notes (Signed)
? ?Assessment & Plan:  ?[name] was seen today for diabetes. ? ?Diagnoses and all orders for this visit: ? ?Type 1 diabetes mellitus with hyperglycemia (HCC) ?-     POCT glucose (manual entry) ?-     POCT glycosylated hemoglobin (Hb A1C) ?-     Ambulatory referral to Endocrinology ?-     POCT glucose (manual entry) ? ?Dyslipidemia, goal LDL below 70 ?Continue atorvastatin prescribed.  ? ?Lipoma of left axilla ?-     Ambulatory referral to General Surgery ?-     Ambulatory referral to Dermatology ? ?Encounter for fertility planning ?-     Ambulatory referral to Gynecology ? ?Right ovarian cyst ?-     US PELVIC COMPLETE WITH TRANSVAGINAL; Future ? ? ? ?Patient has been counseled on age-appropriate routine health concerns for screening and prevention. These are reviewed and up-to-date. Referrals have been placed accordingly. Immunizations are up-to-date or declined.    ?Subjective:  ? ?Chief Complaint  ?Patient presents with  ? Diabetes  ? ?HPI ?[name] 36 y.o. female presents to office today for follow up to DM.  ?She has a past medical history of DKA, type 1 and Type 1 diabetes mellitus  ? ?She has not seen Dr. Kelton Pillar for her diabetes since 2020. States she is looking for a new endocrinologist and due to her job situation she prefers an office that can see patients on Saturdays. She would like to be referred to Dr. Anders Simmonds whose office is open on her preferred day. She is unsure however if they take her insurance. She would like to get started back on her insulin pump now that she has insurance. Currently using the freestyle and notes her blood glucose levels have been very labile with basaglar 16 units daily and novolog 7 units TID and SSI.  ?Lab Results  ?Component Value Date  ? HGBA1C 9.8 (A) 11/08/2021  ?  ?Lab Results  ?Component Value Date  ? HGBA1C 10.1 (A) 03/01/2021  ?  ?LDL at goal with atorvastatin  ?Lab Results  ?Component Value Date  ? [place] 74 06/05/2020  ?  ? ?Requesting referral to  specialist for removal of left axilla lipoma. She also has another lipoma near the right trapezium.  ? ?GU ?Wants to try to save her eggs so that she can try to have a child when her health is optimal. Referred to fertility specialist.  ?She also has right ovarian cyst that needs to be re evaluated via imaging.  ? ? ?Review of Systems  ?Constitutional:  Negative for fever, malaise/fatigue and weight loss.  ?HENT: Negative.  Negative for nosebleeds.   ?Eyes: Negative.  Negative for blurred vision, double vision and photophobia.  ?Respiratory: Negative.  Negative for cough and shortness of breath.   ?Cardiovascular: Negative.  Negative for chest pain, palpitations and leg swelling.  ?Gastrointestinal: Negative.  Negative for heartburn, nausea and vomiting.  ?Musculoskeletal: Negative.  Negative for myalgias.  ?Skin:   ?     SEE HPI  ?Neurological: Negative.  Negative for dizziness, focal weakness, seizures and headaches.  ?Psychiatric/Behavioral: Negative.  Negative for suicidal ideas.   ? ?Past Medical History:  ?Diagnosis Date  ? DKA, type 1 ([place])   ? Type 1 diabetes mellitus ([place])   ? ? ?History reviewed. No pertinent surgical history. ? ?Family History  ?Problem Relation Age of Onset  ? Hypertension Mother   ? Throat cancer Mother   ? Thyroid disease Mother   ? Hypertension Father   ?  Sleep apnea Father   ? Hypertension Sister   ? Diabetes Maternal Aunt   ? ? ?Social History Reviewed with no changes to be made today.  ? ?Outpatient Medications Prior to Visit  ?Medication Sig Dispense Refill  ? amitriptyline (ELAVIL) 25 MG tablet Take 1/2 pill nightly at bedtime for one week, then increase to 1 pill at bedtime 30 tablet 3  ? atorvastatin (LIPITOR) 10 MG tablet TAKE 1 TABLET (10 MG TOTAL) BY MOUTH DAILY. (Patient taking differently: Take 10 mg by mouth daily.) 90 tablet 3  ? Blood Glucose Monitoring Suppl (TRUE METRIX METER) w/Device KIT Use as directed 4 (four) times daily -  with meals and at bedtime. 1 kit 0  ?  carboxymethylcellulose (REFRESH PLUS) 0.5 % SOLN Place 1 drop into both eyes daily as needed (dry eyes). (Patient not taking: Reported on 10/11/2021)    ? clindamycin (CLEOCIN T) 1 % external solution Apply 1 application topically at bedtime.    ? Continuous Blood Gluc Receiver (FREESTYLE LIBRE 2 READER) DEVI Use to monitor blood sugar three times daily.Dx: E10.65 1 each 3  ? Continuous Blood Gluc Sensor (FREESTYLE LIBRE 3 SENSOR) MISC 1 each by Does not apply route 3 (three) times daily. Use to monitor blood sugar three times daily.Dx: E10.65 1 each 3  ? glucose blood test strip Use as instructed four times daily. 100 each 12  ? hydroquinone 4 % cream Apply 1 application topically at bedtime.    ? hydrOXYzine (ATARAX/VISTARIL) 50 MG tablet TAKE 1 TABLET (50 MG TOTAL) BY MOUTH 3 (THREE) TIMES DAILY AS NEEDED FOR ANXIETY. (Patient taking differently: Take 50 mg by mouth 3 (three) times daily as needed for anxiety or itching.) 90 tablet 1  ? insulin aspart (NOVOLOG FLEXPEN) 100 UNIT/ML FlexPen Inject 7 units into the skin three times per day. For blood sugars 0-150 give 0 units of insulin, 151-200 give 2 units of insulin, 201-250 give 4 units, 251-300 give 6 units, 301-350 give 8 units, 351-400 give 10 units,> 400 give 12 units and call M.D. Discussed hypoglycemia protocol 15 mL 2  ? Insulin Glargine (BASAGLAR KWIKPEN) 100 UNIT/ML INJECT 16 UNITS UNDER THE SKIN ONCE DAILY. (Patient taking differently: 16 Units daily.) 15 mL 6  ? Insulin Pen Needle 31G X 5 MM MISC USE AS INSTRUCTED. INJECT INTO THE SKIN THREE TIMES DAILY 100 each 2  ? Lancet Device MISC Use 4 times daily after meals and at bedtime. 1 each 12  ? naproxen (NAPROSYN) 375 MG tablet Take 1 tablet (375 mg total) by mouth 2 (two) times daily with a meal. 30 tablet 0  ? tretinoin (RETIN-A) 0.1 % cream Apply 1 application topically at bedtime.    ? TRUEplus Lancets 28G MISC USE AS INSTRUCTED FOUR TIMES DAILY 100 each 3  ? ?No facility-administered medications  prior to visit.  ? ? ?Allergies  ?Allergen Reactions  ? Lexapro [Escitalopram Oxalate]   ?  NUMBNESS  ? ? ?   ?Objective:  ?  ?BP 126/85   Pulse 94   Wt 166 lb 3.2 oz (75.4 kg)   SpO2 100%   BMI 26.83 kg/m?  ?Wt Readings from Last 3 Encounters:  ?11/08/21 166 lb 3.2 oz (75.4 kg)  ?10/11/21 164 lb 6.4 oz (74.6 kg)  ?09/13/21 156 lb (70.8 kg)  ? ? ?Physical Exam ?Vitals and nursing note reviewed.  ?Constitutional:   ?   Appearance: She is well-developed.  ?HENT:  ?   Head: Normocephalic and atraumatic.  ?  Cardiovascular:  ?   Rate and Rhythm: Normal rate and regular rhythm.  ?   Heart sounds: Normal heart sounds. No murmur heard. ?  No friction rub. No gallop.  ?Pulmonary:  ?   Effort: Pulmonary effort is normal. No tachypnea or respiratory distress.  ?   Breath sounds: Normal breath sounds. No decreased breath sounds, wheezing, rhonchi or rales.  ?Chest:  ?   Chest wall: No tenderness.  ?Abdominal:  ?   General: Bowel sounds are normal.  ?   Palpations: Abdomen is soft.  ?Musculoskeletal:     ?   General: Normal range of motion.  ?   Cervical back: Normal range of motion.  ?Skin: ?   General: Skin is warm and dry.  ?Neurological:  ?   Mental Status: She is alert and oriented to person, place, and time.  ?   Coordination: Coordination normal.  ?Psychiatric:     ?   Behavior: Behavior normal. Behavior is cooperative.     ?   Thought Content: Thought content normal.     ?   Judgment: Judgment normal.  ? ? ? ? ?   ?Patient has been counseled extensively about nutrition and exercise as well as the importance of adherence with medications and regular follow-up. The patient was given clear instructions to go to ER or return to medical center if symptoms don't improve, worsen or new problems develop. The patient verbalized understanding.  ? ?Follow-up: Return in about 3 months (around 02/07/2022).  ? ?Gildardo Pounds, FNP-BC ?[facility] ?[place], [place] ?[phone]   ?11/08/2021, 8:37  PM ?

## 2021-11-18 ENCOUNTER — Other Ambulatory Visit: Payer: Self-pay | Admitting: Nurse Practitioner

## 2021-11-18 ENCOUNTER — Encounter: Payer: Self-pay | Admitting: Nurse Practitioner

## 2021-11-18 ENCOUNTER — Ambulatory Visit (HOSPITAL_COMMUNITY)
Admission: RE | Admit: 2021-11-18 | Discharge: 2021-11-18 | Disposition: A | Discharge status: Home / Self Care | Payer: 59 | Source: Ambulatory Visit | Attending: Nurse Practitioner | Admitting: Nurse Practitioner

## 2021-11-18 DIAGNOSIS — N83201 Unspecified ovarian cyst, right side: Secondary | ICD-10-CM | POA: Diagnosis not present

## 2021-11-18 DIAGNOSIS — D235 Other benign neoplasm of skin of trunk: Secondary | ICD-10-CM | POA: Diagnosis not present

## 2021-11-18 DIAGNOSIS — E1065 Type 1 diabetes mellitus with hyperglycemia: Secondary | ICD-10-CM

## 2021-11-18 DIAGNOSIS — D485 Neoplasm of uncertain behavior of skin: Secondary | ICD-10-CM | POA: Diagnosis not present

## 2021-11-19 ENCOUNTER — Telehealth: Payer: Self-pay | Admitting: Nurse Practitioner

## 2021-11-19 NOTE — Telephone Encounter (Deleted)
Copied from [place] [id number]. Topic: Referral - Status ?>> Nov 19, 2021  1:57 PM Erick Blinks wrote: ?Reason for CRM: Pt called to report that endocrinology did not receive the fax from PCP. Pt called sharing the correct fax:  ? ?Fax:  ? ?Pt disconnected before providing fax ? ?Tried calling Maren Reamer ?

## 2021-11-20 ENCOUNTER — Telehealth: Payer: Self-pay | Admitting: Nurse Practitioner

## 2021-11-20 NOTE — Telephone Encounter (Signed)
Pt called in to follow up on the referral request to [facility], please call back with update when possible.  ?

## 2021-11-20 NOTE — Telephone Encounter (Signed)
Copied from [place] #[id number]. Topic: Referral - Question >> Nov 19, 2021  4:52 PM Pawlus, Brayton Layman A wrote: Reason for CRM: Pt wanted to know if she could have a referral re-sent to [facility], please advise.

## 2021-11-21 NOTE — Telephone Encounter (Signed)
Pt is calling to check on the status of the endocrology referral to [facility]. Please advise CB- [phone] ?

## 2021-11-21 NOTE — Telephone Encounter (Signed)
Routing to PCP

## 2021-11-22 ENCOUNTER — Other Ambulatory Visit: Payer: Self-pay | Admitting: Nurse Practitioner

## 2021-11-22 DIAGNOSIS — E1065 Type 1 diabetes mellitus with hyperglycemia: Secondary | ICD-10-CM

## 2021-11-22 NOTE — Telephone Encounter (Signed)
Referral has been placed.  Any additional referral messages regarding this please forward to Maren Reamer ?

## 2021-11-29 NOTE — Telephone Encounter (Signed)
[facility] is calling to follow up. Have not received fax Please advise CB- [phone] Fax- [phone] ?

## 2021-12-11 ENCOUNTER — Telehealth: Payer: Self-pay

## 2021-12-11 NOTE — Telephone Encounter (Signed)
[facility] sent a fax stating that they do not accept her insurance. New referral needs to be placed. ?

## 2021-12-17 DIAGNOSIS — Z4802 Encounter for removal of sutures: Secondary | ICD-10-CM | POA: Diagnosis not present

## 2021-12-25 DIAGNOSIS — Z319 Encounter for procreative management, unspecified: Secondary | ICD-10-CM | POA: Diagnosis not present

## 2021-12-25 DIAGNOSIS — Z3149 Encounter for other procreative investigation and testing: Secondary | ICD-10-CM | POA: Diagnosis not present

## 2021-12-31 ENCOUNTER — Telehealth: Payer: Self-pay | Admitting: Nurse Practitioner

## 2021-12-31 ENCOUNTER — Encounter: Payer: Self-pay | Admitting: Nurse Practitioner

## 2021-12-31 NOTE — Telephone Encounter (Signed)
Patient came instating that she need some paperwork filled out by her PCP so that her license don't get revoked.

## 2021-12-31 NOTE — Telephone Encounter (Signed)
Attempt to call patient to schedule apt.  Can be virtual apt.   LVM to return call.

## 2022-01-20 ENCOUNTER — Telehealth: Payer: Self-pay | Admitting: Psychiatry

## 2022-01-24 ENCOUNTER — Other Ambulatory Visit: Payer: Self-pay | Admitting: Family Medicine

## 2022-01-24 NOTE — Telephone Encounter (Signed)
Requested Prescriptions  Pending Prescriptions Disp Refills  . Continuous Blood Gluc Sensor (FREESTYLE LIBRE 3 SENSOR) MISC [Pharmacy Med Name: FREESTYLE LIBRE 3 SENSOR KIT] 1 each 0    Sig: USE TO MONITOR BLOOD SUGAR 3 TIMES DAILY     There is no refill protocol information for this order

## 2022-02-05 ENCOUNTER — Encounter: Payer: Self-pay | Admitting: Physician Assistant

## 2022-02-05 ENCOUNTER — Ambulatory Visit: Payer: 59 | Attending: Physician Assistant | Admitting: Physician Assistant

## 2022-02-05 ENCOUNTER — Ambulatory Visit: Payer: 59 | Admitting: Psychiatry

## 2022-02-05 VITALS — BP 112/83 | HR 73 | Wt 164.2 lb

## 2022-02-05 DIAGNOSIS — E785 Hyperlipidemia, unspecified: Secondary | ICD-10-CM | POA: Diagnosis not present

## 2022-02-05 DIAGNOSIS — E1065 Type 1 diabetes mellitus with hyperglycemia: Secondary | ICD-10-CM

## 2022-02-05 DIAGNOSIS — F419 Anxiety disorder, unspecified: Secondary | ICD-10-CM | POA: Diagnosis not present

## 2022-02-05 DIAGNOSIS — F32A Depression, unspecified: Secondary | ICD-10-CM | POA: Diagnosis not present

## 2022-02-05 DIAGNOSIS — R69 Illness, unspecified: Secondary | ICD-10-CM | POA: Diagnosis not present

## 2022-02-05 LAB — POCT GLYCOSYLATED HEMOGLOBIN (HGB A1C): HbA1c, POC (controlled diabetic range): 9.9 % — AB (ref 0.0–7.0)

## 2022-02-05 LAB — GLUCOSE, POCT (MANUAL RESULT ENTRY): POC Glucose: 152 mg/dl — AB (ref 70–99)

## 2022-02-05 MED ORDER — FREESTYLE LIBRE 3 SENSOR MISC: 6 refills | Status: DC

## 2022-02-05 MED ORDER — HYDROXYZINE HCL 50 MG PO TABS: ORAL_TABLET | ORAL | 1 refills | Status: DC

## 2022-02-05 MED ORDER — FREESTYLE LIBRE 3 SENSOR MISC: 11 refills | Status: DC

## 2022-02-05 MED ORDER — BASAGLAR KWIKPEN 100 UNIT/ML ~~LOC~~ SOPN: 16.0000 [IU] | PEN_INJECTOR | Freq: Every day | SUBCUTANEOUS | 3 refills | Status: DC

## 2022-02-05 MED ORDER — NOVOLOG FLEXPEN 100 UNIT/ML ~~LOC~~ SOPN: PEN_INJECTOR | SUBCUTANEOUS | 2 refills | Status: DC

## 2022-02-05 MED ORDER — ATORVASTATIN CALCIUM 10 MG PO TABS: 10.0000 mg | ORAL_TABLET | Freq: Every day | ORAL | 1 refills | Status: AC

## 2022-02-05 MED ORDER — INSULIN PEN NEEDLE 31G X 5 MM MISC: 2 refills | Status: AC

## 2022-02-05 NOTE — Progress Notes (Signed)
Patient ID: [name], female   DOB: 1985/12/01, 36 y.o.   MRN: [id number]     [name], is a 36 y.o. female  EPP:[id number]  SAY:[id number]  DOB - [birth year]  Chief Complaint  Patient presents with   Medication Refill       Subjective:   [name] is a 36 y.o. female here today for med RF.  She also wants to get a counselor to work through some depression issues.  She has an appt with endocrinology on Monday(5 days from now)-I did not know this when I ordered her A1C.  She denies polyuria/polydipsia.    With depression/anxiety-she says it is due to life circumstances.  She is not interested in SSRI at this time.  PHQ9 #9=1.  Denies plan or intent.  Has fleeting thoughts of "wishing I wasn't here."  Protective factors-family and hope for the future.  She admits to life-long depression.  Has an endocrinology appt in 5 days on 02/10/2022 No problems updated.  ALLERGIES: Allergies  Allergen Reactions   Lexapro [Escitalopram Oxalate]     NUMBNESS    PAST MEDICAL HISTORY: Past Medical History:  Diagnosis Date   DKA, type 1 ([place])    Type 1 diabetes mellitus ([place])     MEDICATIONS AT HOME: Prior to Admission medications   Medication Sig Start Date End Date Taking? Authorizing Provider  amitriptyline (ELAVIL) 25 MG tablet Take 1/2 pill nightly at bedtime for one week, then increase to 1 pill at bedtime 10/16/21  Yes Chima, Anderson [place], MD  Blood Glucose Monitoring Suppl (TRUE METRIX METER) w/Device KIT Use as directed 4 (four) times daily -  with meals and at bedtime. 03/01/21  Yes Gildardo Pounds, NP  clindamycin (CLEOCIN T) 1 % external solution Apply 1 application topically at bedtime. 06/27/21  Yes [provider]  hydroquinone 4 % cream Apply 1 application topically at bedtime.   Yes [provider]  naproxen (NAPROSYN) 375 MG tablet Take 1 tablet (375 mg total) by mouth 2 (two) times daily with a meal. 01/10/21  Yes Jaynee Eagles, PA-C  tretinoin  (RETIN-A) 0.1 % cream Apply 1 application topically at bedtime.   Yes [provider]  atorvastatin (LIPITOR) 10 MG tablet Take 1 tablet (10 mg total) by mouth daily. 02/05/22 02/05/23  Argentina Donovan, PA-C  carboxymethylcellulose (REFRESH PLUS) 0.5 % SOLN Place 1 drop into both eyes daily as needed (dry eyes). Patient not taking: Reported on 10/11/2021    [provider]  Continuous Blood Gluc Sensor (FREESTYLE LIBRE 3 SENSOR) MISC USE TO MONITOR BLOOD SUGAR 3 TIMES DAILY 02/05/22   Argentina Donovan, PA-C  hydrOXYzine (ATARAX) 50 MG tablet TAKE 1 TABLET (50 MG TOTAL) BY MOUTH 3 (THREE) TIMES DAILY AS NEEDED FOR ANXIETY. 02/05/22 02/05/23  Argentina Donovan, PA-C  insulin aspart (NOVOLOG FLEXPEN) 100 UNIT/ML FlexPen Inject 7 units into the skin three times per day. For blood sugars 0-150 give 0 units of insulin, 151-200 give 2 units of insulin, 201-250 give 4 units, 251-300 give 6 units, 301-350 give 8 units, 351-400 give 10 units,> 400 give 12 units and call M.D. Discussed hypoglycemia protocol 02/05/22   Argentina Donovan, PA-C  Insulin Glargine [facility]) 100 UNIT/ML Inject 16 Units into the skin daily. 02/05/22 02/05/23  Argentina Donovan, PA-C  Insulin Pen Needle 31G X 5 MM MISC USE AS INSTRUCTED. INJECT INTO THE SKIN THREE TIMES DAILY 02/05/22 02/05/23  Argentina Donovan, PA-C  ROS: Neg HEENT Neg resp Neg cardiac Neg GI Neg GU Neg MS Neg neuro  Objective:   Vitals:   02/05/22 1439  BP: 112/83  Pulse: 73  SpO2: 100%  Weight: 164 lb 3.2 oz (74.5 kg)   Exam General appearance : Awake, alert, not in any distress. Speech Clear. Not toxic looking HEENT: Atraumatic and Normocephalic Neck: Supple, no JVD. No cervical lymphadenopathy.  Chest: Good air entry bilaterally, CTAB.  No rales/rhonchi/wheezing CVS: S1 S2 regular, no murmurs.  Extremities: B/L Lower Ext shows no edema, both legs are warm to touch Neurology: Awake alert, and oriented X 3, CN II-XII intact,  Non focal Skin: No Rash Psych: Mood stable.  Affect blunted but gives appropriate smiles,etc.    Data Review Lab Results  Component Value Date   HGBA1C 9.9 (A) 02/05/2022   HGBA1C 9.8 (A) 11/08/2021   HGBA1C 10.1 (A) 03/01/2021    Assessment & Plan   1. Type 1 diabetes mellitus with hyperglycemia (HCC) Uncontrolled-no changes to regimen today bc sees endocrinology in 5 days;  they will also be doing additional labs - Glucose (CBG) - HgB A1c - Insulin Glargine (BASAGLAR KWIKPEN) 100 UNIT/ML; Inject 16 Units into the skin daily.  Dispense: 15 mL; Refill: 3 - Insulin Pen Needle 31G X 5 MM MISC; USE AS INSTRUCTED. INJECT INTO THE SKIN THREE TIMES DAILY  Dispense: 100 each; Refill: 2 - insulin aspart (NOVOLOG FLEXPEN) 100 UNIT/ML FlexPen; Inject 7 units into the skin three times per day. For blood sugars 0-150 give 0 units of insulin, 151-200 give 2 units of insulin, 201-250 give 4 units, 251-300 give 6 units, 301-350 give 8 units, 351-400 give 10 units,> 400 give 12 units and call M.D. Discussed hypoglycemia protocol  Dispense: 15 mL; Refill: 2 - Continuous Blood Gluc Sensor (FREESTYLE LIBRE 3 SENSOR) MISC; USE TO MONITOR BLOOD SUGAR 3 TIMES DAILY  Dispense: 1 each; Refill: 11  2. Dyslipidemia, goal LDL below 70 Resume-she has not been taking this a while - atorvastatin (LIPITOR) 10 MG tablet; Take 1 tablet (10 mg total) by mouth daily.  Dispense: 90 tablet; Refill: 1  3. Anxiety and depression - hydrOXYzine (ATARAX) 50 MG tablet; TAKE 1 TABLET (50 MG TOTAL) BY MOUTH 3 (THREE) TIMES DAILY AS NEEDED FOR ANXIETY.  Dispense: 90 tablet; Refill: 1 - Ambulatory referral to Psychology -self-care and deep breathing discussed.  Lengthy discussion B/R of SSRI. I thinks she could benefit from this but she defers for now.     Return in about 4 months (around 06/08/2022) for PCP for chronic conditions.  The patient was given clear instructions to go to ER or return to medical center if symptoms  don't improve, worsen or new problems develop. The patient verbalized understanding. The patient was told to call to get lab results if they haven't heard anything in the next week.      Freeman Caldron, PA-C [facility] [place], [place]   02/05/2022, 3:32 PM

## 2022-02-05 NOTE — Patient Instructions (Addendum)
[facility] [address], [place], [place] [address] [phone] or [phone] Walk-in urgent care 24/7 for anyone  For [facility] ONLY New patient assessments and therapy walk-ins: Monday and Wednesday 8am-11am First and second Friday 1pm-5pm New patient psychiatry and medication management walk-ins: Mondays, Wednesdays, Thursdays, Fridays 8am-11am No psychiatry walk-ins on Tuesday    Managing Depression, Adult Depression is a mental health condition that affects your thoughts, feelings, and actions. Being diagnosed with depression can bring you relief if you did not know why you have felt or behaved a certain way. It could also leave you feeling overwhelmed with uncertainty about your future. Preparing yourself to manage your symptoms can help you feel more positive about your future. How to manage lifestyle changes Managing stress  Stress is your body's reaction to life changes and events, both good and bad. Stress can add to your feelings of depression. Learning to manage your stress can help lessen your feelings of depression. Try some of the following approaches to reducing your stress (stress reduction techniques): Listen to music that you enjoy and that inspires you. Try using a meditation app or take a meditation class. Develop a practice that helps you connect with your spiritual self. Walk in nature, pray, or go to a place of worship. Do some deep breathing. To do this, inhale slowly through your nose. Pause at the top of your inhale for a few seconds and then exhale slowly, letting your muscles relax. Practice yoga to help relax and work your muscles. Choose a stress reduction technique that suits your lifestyle and personality. These techniques take time and practice to develop. Set aside 5-15 minutes a day to do them. Therapists can offer training in these techniques. Other things you can do to manage stress include: Keeping a  stress diary. Knowing your limits and saying no when you think something is too much. Paying attention to how you react to certain situations. You may not be able to control everything, but you can change your reaction. Adding humor to your life by watching funny films or TV shows. Making time for activities that you enjoy and that relax you.  Medicines Medicines, such as antidepressants, are often a part of treatment for depression. Talk with your pharmacist or health care provider about all the medicines, supplements, and herbal products that you take, their possible side effects, and what medicines and other products are safe to take together. Make sure to report any side effects you may have to your health care provider. Relationships Your health care provider may suggest family therapy, couples therapy, or individual therapy as part of your treatment. How to recognize changes Everyone responds differently to treatment for depression. As you recover from depression, you may start to: Have more interest in doing activities. Feel less hopeless. Have more energy. Overeat less often, or have a better appetite. Have better mental focus. It is important to recognize if your depression is not getting better or is getting worse. The symptoms you had in the beginning may return, such as: Tiredness (fatigue) or low energy. Eating too much or too little. Sleeping too much or too little. Feeling restless, agitated, or hopeless. Trouble focusing or making decisions. Unexplained physical complaints. Feeling irritable, angry, or aggressive. If you or your family members notice these symptoms coming back, let your health care provider know right away. Follow these instructions at home: Activity  Try to get some form of exercise each day, such as walking, biking,  swimming, or lifting weights. Practice stress reduction techniques. Engage your mind by taking a class or doing some volunteer  work. Lifestyle Get the right amount and quality of sleep. Cut down on using caffeine, tobacco, alcohol, and other potentially harmful substances. Eat a healthy diet that includes plenty of vegetables, fruits, whole grains, low-fat dairy products, and lean protein. Do not eat a lot of foods that are high in solid fats, added sugars, or salt (sodium). General instructions Take over-the-counter and prescription medicines only as told by your health care provider. Keep all follow-up visits as told by your health care provider. This is important. Where to find support Talking to others  Friends and family members can be sources of support and guidance. Talk to trusted friends or family members about your condition. Explain your symptoms to them, and let them know that you are working with a health care provider to treat your depression. Tell friends and family members how they also can be helpful. Finances Find appropriate mental health providers that fit with your financial situation. Talk with your health care provider about options to get reduced prices on your medicines. Where to find more information You can find support in your area from: Anxiety and Depression Association of America (ADAA): www.adaa.org Mental Health America: www.mentalhealthamerica.net [employer] on Mental Illness: www.nami.org Contact a health care provider if: You stop taking your antidepressant medicines, and you have any of these symptoms: Nausea. Headache. Light-headedness. Chills and body aches. Not being able to sleep (insomnia). You or your friends and family think your depression is getting worse. Get help right away if: You have thoughts of hurting yourself or others. If you ever feel like you may hurt yourself or others, or have thoughts about taking your own life, get help right away. Go to your nearest emergency department or: Call your local emergency services (911 in the [place] Call a suicide  crisis helpline, such as the [place] at 601-446-1659 or 988 in the [place]. This is open 24 hours a day in the [place] Text the Crisis Text Line at 579 696 8275 (in the [place].). Summary If you are diagnosed with depression, preparing yourself to manage your symptoms is a good way to feel positive about your future. Work with your health care provider on a management plan that includes stress reduction techniques, medicines (if applicable), therapy, and healthy lifestyle habits. Keep talking with your health care provider about how your treatment is working. If you have thoughts about taking your own life, call a suicide crisis helpline or text a crisis text line. This information is not intended to replace advice given to you by your health care provider. Make sure you discuss any questions you have with your health care provider. Document Revised: 02/06/2021 Document Reviewed: 05/25/2019 Elsevier Patient Education  [place].

## 2022-02-10 ENCOUNTER — Telehealth: Payer: Self-pay | Admitting: Emergency Medicine

## 2022-02-10 NOTE — Telephone Encounter (Signed)
Copied from [place] [id number]. Topic: General - Other >> Feb 10, 2022  9:49 AM [place] B wrote: Reason for [id number] called in checking status of DMV medical paperwork sent in 3 weeks ago. Please call back

## 2022-02-11 ENCOUNTER — Encounter: Payer: Self-pay | Admitting: Nurse Practitioner

## 2022-02-11 DIAGNOSIS — E1069 Type 1 diabetes mellitus with other specified complication: Secondary | ICD-10-CM | POA: Insufficient documentation

## 2022-02-12 ENCOUNTER — Encounter: Payer: Self-pay | Admitting: Nurse Practitioner

## 2022-02-12 NOTE — Telephone Encounter (Signed)
Pt was called and informed that she will need to come in office and complete her portion of the paperwork, paperwork from eye doctor has been received.

## 2022-02-12 NOTE — Telephone Encounter (Signed)
Do you know if this is ready ?

## 2022-02-17 ENCOUNTER — Ambulatory Visit: Payer: No Typology Code available for payment source | Admitting: Dermatology

## 2022-02-21 ENCOUNTER — Ambulatory Visit (HOSPITAL_COMMUNITY)
Admission: EM | Admit: 2022-02-21 | Discharge: 2022-02-21 | Disposition: A | Discharge status: Home / Self Care | Payer: 59

## 2022-02-21 ENCOUNTER — Ambulatory Visit (INDEPENDENT_AMBULATORY_CARE_PROVIDER_SITE_OTHER): Payer: 59

## 2022-02-21 ENCOUNTER — Encounter (HOSPITAL_COMMUNITY): Payer: Self-pay

## 2022-02-21 DIAGNOSIS — S92421A Displaced fracture of distal phalanx of right great toe, initial encounter for closed fracture: Secondary | ICD-10-CM

## 2022-02-21 DIAGNOSIS — M7989 Other specified soft tissue disorders: Secondary | ICD-10-CM | POA: Diagnosis not present

## 2022-02-21 NOTE — Discharge Instructions (Addendum)
Apply ice, elevate Can take Tylenol or Ibuprofen as needed for pain and swelling Follow up with orthopedics

## 2022-02-21 NOTE — ED Triage Notes (Signed)
Patient states she was playing kick ball in soft shoes. Having a sharp dull pain in the right big toe with movement and to the touch. Patient states the toe is swollen.

## 2022-02-24 DIAGNOSIS — S92421A Displaced fracture of distal phalanx of right great toe, initial encounter for closed fracture: Secondary | ICD-10-CM | POA: Diagnosis not present

## 2022-03-03 ENCOUNTER — Ambulatory Visit: Payer: 59 | Admitting: Psychiatry

## 2022-03-10 ENCOUNTER — Other Ambulatory Visit: Payer: Self-pay | Admitting: Physician Assistant

## 2022-03-10 DIAGNOSIS — S92423A Displaced fracture of distal phalanx of unspecified great toe, initial encounter for closed fracture: Secondary | ICD-10-CM | POA: Insufficient documentation

## 2022-03-10 DIAGNOSIS — S92424A Nondisplaced fracture of distal phalanx of right great toe, initial encounter for closed fracture: Secondary | ICD-10-CM | POA: Diagnosis not present

## 2022-03-10 DIAGNOSIS — M79671 Pain in right foot: Secondary | ICD-10-CM | POA: Diagnosis not present

## 2022-03-10 DIAGNOSIS — F419 Anxiety disorder, unspecified: Secondary | ICD-10-CM

## 2022-03-10 NOTE — ED Provider Notes (Addendum)
[place]    CSN: [id number] Arrival date & time: 02/21/22  1859      History   Chief Complaint Chief Complaint  Patient presents with   Toe Pain    Right big toe    HPI [name] is a 36 y.o. female.   Pt complains of right great toe pain that started after she was wearing soft shoes while playing kickball, reports kicking the ball very hard.  She reports the toe is swollen.  Pain is worse with movement.  She has taken tylenol with some relief. No other injuries.     Past Medical History:  Diagnosis Date   DKA, type 1 ([place])    Type 1 diabetes mellitus ([place])     Patient Active Problem List   Diagnosis Date Noted   Diabetic ketoacidosis ([place]) 04/15/2018   Refusal of blood transfusion for reasons of conscience 12/26/2017   DKA, type 1 ([place]) 05/19/2017   Colitis    Prolonged QT interval    Hyperpigmentation of skin, postinflammatory 08/07/2016   Dizziness 04/25/2016   DKA (diabetic ketoacidoses) 04/25/2016   Rhinitis, allergic 10/11/2015   Diabetes type 1, uncontrolled 07/25/2015   Cephalalgia 07/25/2015   Generalized anxiety disorder 07/25/2015   Depression 07/25/2015   Diabetic ketoacidosis without coma associated with type 1 diabetes mellitus ([place]) 07/11/2012   Leukocytosis 07/11/2012   Hyperkalemia 07/11/2012   High anion gap metabolic acidosis 04/88/8916    History reviewed. No pertinent surgical history.  OB History   No obstetric history on file.      Home Medications    Prior to Admission medications   Medication Sig Start Date End Date Taking? Authorizing Provider  atorvastatin (LIPITOR) 10 MG tablet Take 1 tablet (10 mg total) by mouth daily. 02/05/22 02/05/23 Yes McClung, Dionne Bucy, PA-C  Continuous Blood Gluc Sensor (FREESTYLE LIBRE 3 SENSOR) MISC USE TO MONITOR BLOOD SUGAR 3 TIMES DAILY 02/05/22  Yes Freeman Caldron M, PA-C  insulin aspart (FIASP FLEXTOUCH) 100 UNIT/ML FlexTouch Pen 1 unit per 15 grams carbohydrates plus  sliding scale:  Add 1 unit per glucose 50 above 150.  Max Total Daily Dose:  20 units/day. 02/11/22  Yes [provider]  Insulin Glargine (BASAGLAR KWIKPEN) 100 UNIT/ML Inject 16 Units into the skin daily. 02/05/22 02/05/23 Yes McClung, Dionne Bucy, PA-C  Insulin Pen Needle 31G X 5 MM MISC USE AS INSTRUCTED. INJECT INTO THE SKIN THREE TIMES DAILY 02/05/22 02/05/23 Yes Argentina Donovan, PA-C  amitriptyline (ELAVIL) 25 MG tablet Take 1/2 pill nightly at bedtime for one week, then increase to 1 pill at bedtime 10/16/21   Genia Harold, MD  Blood Glucose Monitoring Suppl (TRUE METRIX METER) w/Device KIT Use as directed 4 (four) times daily -  with meals and at bedtime. 03/01/21   Gildardo Pounds, NP  carboxymethylcellulose (REFRESH PLUS) 0.5 % SOLN Place 1 drop into both eyes daily as needed (dry eyes). Patient not taking: Reported on 10/11/2021    [provider]  clindamycin (CLEOCIN T) 1 % external solution Apply 1 application topically at bedtime. 06/27/21   [provider]  hydroquinone 4 % cream Apply 1 application topically at bedtime.    [provider]  hydrOXYzine (ATARAX) 50 MG tablet TAKE 1 TABLET BY MOUTH 3 TIMES DAILY AS NEEDED FOR ANXIETY. 03/10/22   Charlott Rakes, MD  naproxen (NAPROSYN) 375 MG tablet Take 1 tablet (375 mg total) by mouth 2 (two) times daily with a meal. 01/10/21  Jaynee Eagles, PA-C  tretinoin (RETIN-A) 0.1 % cream Apply 1 application topically at bedtime.    [provider]    Family History Family History  Problem Relation Age of Onset   Hypertension Mother    Throat cancer Mother    Thyroid disease Mother    Hypertension Father    Sleep apnea Father    Hypertension Sister    Diabetes Maternal Aunt     Social History Social History   Tobacco Use   Smoking status: Never    Passive exposure: Yes   Smokeless tobacco: Never  Vaping Use   Vaping Use: Never used  Substance Use Topics   Alcohol use: Yes    Comment:  occasionally    Drug use: No     Allergies   Lexapro [escitalopram oxalate]   Review of Systems Review of Systems  Constitutional:  Negative for chills and fever.  HENT:  Negative for ear pain and sore throat.   Eyes:  Negative for pain and visual disturbance.  Respiratory:  Negative for cough and shortness of breath.   Cardiovascular:  Negative for chest pain and palpitations.  Gastrointestinal:  Negative for abdominal pain and vomiting.  Genitourinary:  Negative for dysuria and hematuria.  Musculoskeletal:  Positive for arthralgias (right great toe pain). Negative for back pain.  Skin:  Negative for color change and rash.  Neurological:  Negative for seizures and syncope.  All other systems reviewed and are negative.    Physical Exam Triage Vital Signs ED Triage Vitals  Enc Vitals Group     BP 02/21/22 1925 106/72     Pulse Rate 02/21/22 1925 90     Resp 02/21/22 1925 16     Temp 02/21/22 1925 98.1 F (36.7 C)     Temp Source 02/21/22 1925 Oral     SpO2 02/21/22 1925 99 %     Weight 02/21/22 1922 162 lb (73.5 kg)     Height 02/21/22 1922 _0  (1.676 m)     Head Circumference --      Peak Flow --      Pain Score 02/21/22 1922 10     Pain Loc --      Pain Edu? --      Excl. in [place]? --    No data found.  Updated Vital Signs BP 106/72 (BP Location: Right Arm)   Pulse 90   Temp 98.1 F (36.7 C) (Oral)   Resp 16   Ht _1  (1.676 m)   Wt 162 lb (73.5 kg)   LMP 01/30/2022 (Exact Date)   SpO2 99%   BMI 26.15 kg/m   Visual Acuity Right Eye Distance:   Left Eye Distance:   Bilateral Distance:    Right Eye Near:   Left Eye Near:    Bilateral Near:     Physical Exam Vitals and nursing note reviewed.  Constitutional:      General: She is not in acute distress.    Appearance: She is well-developed.  HENT:     Head: Normocephalic and atraumatic.  Eyes:     Conjunctiva/sclera: Conjunctivae normal.  Cardiovascular:     Rate and Rhythm: Normal rate and  regular rhythm.     Heart sounds: No murmur heard. Pulmonary:     Effort: Pulmonary effort is normal. No respiratory distress.     Breath sounds: Normal breath sounds.  Abdominal:     Palpations: Abdomen is soft.     Tenderness: There is no  abdominal tenderness.  Musculoskeletal:        General: No swelling.     Cervical back: Neck supple.  Feet:     Comments: Right great toe with bruising, mild swelling.  Decreased ROM due to pain.  Skin:    General: Skin is warm and dry.     Capillary Refill: Capillary refill takes less than 2 seconds.  Neurological:     Mental Status: She is alert.  Psychiatric:        Mood and Affect: Mood normal.      UC Treatments / Results  Labs (all labs ordered are listed, but only abnormal results are displayed) Labs Reviewed - No data to display  EKG   Radiology No results found.  Procedures Procedures (including critical care time)  Medications Ordered in UC Medications - No data to display  Initial Impression / Assessment and Plan / UC Course  I have reviewed the triage vital signs and the nursing notes.  Pertinent labs & imaging results that were available during my care of the patient were reviewed by me and considered in my medical decision making (see chart for details).     Right great toe fracture.  Post op shoe given in clinic .  Advised supportive care and follow up with ortho.   Final Clinical Impressions(s) / UC Diagnoses   Final diagnoses:  Displaced fracture of distal phalanx of right great toe, initial encounter for closed fracture     Discharge Instructions      Apply ice, elevate Can take Tylenol or Ibuprofen as needed for pain and swelling Follow up with orthopedics    ED Prescriptions   None    PDMP not reviewed this encounter.   Ward, Lenise Arena, PA-C 03/10/22 1909    Ward, Lenise Arena, PA-C 03/25/22 [phone]

## 2022-03-24 ENCOUNTER — Encounter (INDEPENDENT_AMBULATORY_CARE_PROVIDER_SITE_OTHER): Payer: 59 | Admitting: Psychiatry

## 2022-03-24 DIAGNOSIS — G4452 New daily persistent headache (NDPH): Secondary | ICD-10-CM

## 2022-03-24 DIAGNOSIS — R413 Other amnesia: Secondary | ICD-10-CM

## 2022-03-25 ENCOUNTER — Telehealth: Payer: Self-pay | Admitting: Psychiatry

## 2022-03-25 NOTE — Telephone Encounter (Signed)

## 2022-03-25 NOTE — Telephone Encounter (Signed)
[employer] sent to GI they obtain auth 

## 2022-04-07 ENCOUNTER — Other Ambulatory Visit: Payer: Self-pay | Admitting: Physician Assistant

## 2022-04-07 DIAGNOSIS — E1065 Type 1 diabetes mellitus with hyperglycemia: Secondary | ICD-10-CM

## 2022-04-09 ENCOUNTER — Ambulatory Visit: Payer: 59 | Admitting: Nurse Practitioner

## 2022-04-10 DIAGNOSIS — E109 Type 1 diabetes mellitus without complications: Secondary | ICD-10-CM | POA: Diagnosis not present

## 2022-04-13 ENCOUNTER — Ambulatory Visit
Admission: RE | Admit: 2022-04-13 | Discharge: 2022-04-13 | Disposition: A | Discharge status: Home / Self Care | Payer: 59 | Source: Ambulatory Visit | Attending: Psychiatry | Admitting: Psychiatry

## 2022-04-13 DIAGNOSIS — G4452 New daily persistent headache (NDPH): Secondary | ICD-10-CM | POA: Diagnosis not present

## 2022-04-13 MED ORDER — GADOBUTROL 1 MMOL/ML IV SOLN
7.0000 mL | Freq: Once | INTRAVENOUS | Status: AC | PRN
  Administered 2022-04-13: 7 mL via INTRAVENOUS

## 2022-04-14 DIAGNOSIS — S92424A Nondisplaced fracture of distal phalanx of right great toe, initial encounter for closed fracture: Secondary | ICD-10-CM | POA: Diagnosis not present

## 2022-05-06 ENCOUNTER — Other Ambulatory Visit: Payer: Self-pay | Admitting: Family Medicine

## 2022-05-06 DIAGNOSIS — E1065 Type 1 diabetes mellitus with hyperglycemia: Secondary | ICD-10-CM

## 2022-05-12 ENCOUNTER — Other Ambulatory Visit: Payer: Self-pay | Admitting: Nurse Practitioner

## 2022-05-12 DIAGNOSIS — E1065 Type 1 diabetes mellitus with hyperglycemia: Secondary | ICD-10-CM

## 2022-05-12 NOTE — Telephone Encounter (Signed)
Medication Refill - Medication: Insulin Glargine (BASAGLAR KWIKPEN) 100 UNIT/ML  Has the patient contacted their pharmacy? Yes.     Preferred Pharmacy (with phone number or street name):  [id number] [place]:  [phone] Fax:  [phone]    Has the patient been seen for an appointment in the last year OR does the patient have an upcoming appointment? Yes.    Please assist patient further. Patient has a follow up appt scheduled and is on the wait list for medication managment

## 2022-05-13 MED ORDER — BASAGLAR KWIKPEN 100 UNIT/ML ~~LOC~~ SOPN: 16.0000 [IU] | PEN_INJECTOR | Freq: Every day | SUBCUTANEOUS | 0 refills | Status: DC

## 2022-05-13 NOTE — Telephone Encounter (Signed)
Requested medication (s) are due for refill today:   Yes  Requested medication (s) are on the active medication list:   Yes  Future visit scheduled:   Yes but not until 07/08/2022 with Geryl Rankins   Last ordered: 9/122023 15 ml, 0 refills  Returned for provider review prior to upcoming appt.   Per note no refills until seen at appt.       Requested Prescriptions  Pending Prescriptions Disp Refills   Insulin Glargine (BASAGLAR KWIKPEN) 100 UNIT/ML 15 mL 0    Sig: Inject 16 Units into the skin daily.     Endocrinology:  Diabetes - Insulins Failed - 05/12/2022  2:27 PM      Failed - HBA1C is between 0 and 7.9 and within 180 days    HbA1c, POC (controlled diabetic range)  Date Value Ref Range Status  02/05/2022 9.9 (A) 0.0 - 7.0 % Final         Passed - Valid encounter within last 6 months    Recent Outpatient Visits           3 months ago Type 1 diabetes mellitus with hyperglycemia [facility])   [place], [place], [place]   6 months ago Type 1 diabetes mellitus with hyperglycemia [facility])   [place], [place], NP   1 year ago Type 1 diabetes mellitus with hyperglycemia [facility])   [place], Vernia Buff, NP   1 year ago Type 1 diabetes mellitus with hyperglycemia [facility])   [place], Vernia Buff, NP   1 year ago Encounter for annual physical exam   [place], Vernia Buff, NP       Future Appointments             In 1 month Gildardo Pounds, NP [place]

## 2022-05-15 DIAGNOSIS — E109 Type 1 diabetes mellitus without complications: Secondary | ICD-10-CM | POA: Diagnosis not present

## 2022-05-16 ENCOUNTER — Ambulatory Visit: Payer: Self-pay

## 2022-05-16 NOTE — Telephone Encounter (Signed)
  Chief Complaint: Vaginal yeast infection Symptoms: Thick white vaginal discharge Frequency: 3-4 days Pertinent Negatives: Patient denies abdominal pain rash, Disposition: '[]'$ ED /'[]'$ Urgent Care (no appt availability in office) / '[]'$ Appointment(In office/virtual)/ '[]'$  [place]/ '[]'$ Home Care/ '[]'$ Refused Recommended Disposition /'[]'$ [place] [facility]/ '[x]'$  Follow-up with PCP Additional Notes: Pt has thick white itchy vaginal discharge. Pt would like an Rx for Fluconazole called in . She would like 3 doses. She would like a refill available as well. PT is a type 1 diabetic and has yeast infections often. Pt states that OTC medications do not work for her.   Summary: Yaginal itching and discharge   Patient states the she has been experiencing vaginal itching and discharge for 3 days. Patient is requesting a prescription of Fluconazole.      Reason for Disposition  Symptoms of a vaginal yeast infection (i.e., white, thick, cottage-cheese-like, itchy, not bad smelling discharge)  Answer Assessment - Initial Assessment Questions 1. DISCHARGE: "Describe the discharge." (e.g., white, yellow, green, gray, foamy, cottage cheese-like)     White, Cottage cheese 2. ODOR: "Is there a bad odor?"     no 3. ONSET: "When did the discharge begin?"     3 days agono 4. RASH: "Is there a rash in the genital area?" If Yes, ask: "Describe it." (e.g., redness, blisters, sores, bumps)     no 5. ABDOMEN PAIN: "Are you having any abdomen pain?" If Yes, ask: "What does it feel like? " (e.g., crampy, dull, intermittent, constant)      no 6. ABDOMEN PAIN SEVERITY: If present, ask: "How bad is it?" (e.g., Scale 1-10; mild, moderate, or severe)   - MILD (1-3): Doesn't interfere with normal activities, abdomen soft and not tender to touch.    - MODERATE (4-7): Interferes with normal activities or awakens from sleep, abdomen tender to touch.    - SEVERE (8-10): Excruciating pain, doubled over, unable to do any  normal activities. (R/O peritonitis)      no 7. CAUSE: "What do you think is causing the discharge?" "Have you had the same problem before? What happened then?"     Yeast infection 8. OTHER SYMPTOMS: "Do you have any other symptoms?" (e.g., fever, itching, vaginal bleeding, pain with urination, injury to genital area, vaginal foreign body)     no 9. PREGNANCY: "Is there any chance you are pregnant?" "When was your last menstrual period?"     no  Protocols used: Vaginal Discharge-A-AH

## 2022-05-20 ENCOUNTER — Other Ambulatory Visit (HOSPITAL_COMMUNITY)
Admission: RE | Admit: 2022-05-20 | Discharge: 2022-05-20 | Disposition: A | Discharge status: Home / Self Care | Payer: 59 | Source: Ambulatory Visit | Attending: Nurse Practitioner | Admitting: Nurse Practitioner

## 2022-05-20 ENCOUNTER — Ambulatory Visit: Payer: 59 | Attending: Nurse Practitioner | Admitting: Nurse Practitioner

## 2022-05-20 ENCOUNTER — Other Ambulatory Visit: Payer: Self-pay | Admitting: Nurse Practitioner

## 2022-05-20 ENCOUNTER — Ambulatory Visit: Payer: 59 | Attending: Nurse Practitioner

## 2022-05-20 DIAGNOSIS — D649 Anemia, unspecified: Secondary | ICD-10-CM

## 2022-05-20 DIAGNOSIS — N898 Other specified noninflammatory disorders of vagina: Secondary | ICD-10-CM | POA: Insufficient documentation

## 2022-05-20 DIAGNOSIS — E1065 Type 1 diabetes mellitus with hyperglycemia: Secondary | ICD-10-CM

## 2022-05-20 DIAGNOSIS — E785 Hyperlipidemia, unspecified: Secondary | ICD-10-CM | POA: Diagnosis not present

## 2022-05-20 NOTE — Progress Notes (Signed)
Patient was seen by medical assistant today for a self swab sample. Patient complained of vaginal itching and discharge onset 4 days ago. Requesting Diflucan be sent in for reoccurring issue and Rx be sent in for her [place] [facility].  Sample was collected for send out swab.

## 2022-05-21 LAB — CMP14+EGFR
ALT: 15 IU/L (ref 0–32)
AST: 12 IU/L (ref 0–40)
Albumin/Globulin Ratio: 1.7 (ref 1.2–2.2)
Albumin: 4.3 g/dL (ref 3.9–4.9)
Alkaline Phosphatase: 69 IU/L (ref 44–121)
BUN/Creatinine Ratio: 19 (ref 9–23)
BUN: 14 mg/dL (ref 6–20)
Bilirubin Total: 0.9 mg/dL (ref 0.0–1.2)
CO2: 16 mmol/L — ABNORMAL LOW (ref 20–29)
Calcium: 9.6 mg/dL (ref 8.7–10.2)
Chloride: 96 mmol/L (ref 96–106)
Creatinine, Ser: 0.72 mg/dL (ref 0.57–1.00)
Globulin, Total: 2.6 g/dL (ref 1.5–4.5)
Glucose: 383 mg/dL — ABNORMAL HIGH (ref 70–99)
Potassium: 4.7 mmol/L (ref 3.5–5.2)
Sodium: 133 mmol/L — ABNORMAL LOW (ref 134–144)
Total Protein: 6.9 g/dL (ref 6.0–8.5)
eGFR: 112 mL/min/{1.73_m2} (ref >59)

## 2022-05-21 LAB — CBC WITH DIFFERENTIAL/PLATELET
Basophils Absolute: 0.1 10*3/uL (ref 0.0–0.2)
Basos: 1 %
EOS (ABSOLUTE): 0.1 10*3/uL (ref 0.0–0.4)
Eos: 2 %
Hematocrit: 40 % (ref 34.0–46.6)
Hemoglobin: 12.1 g/dL (ref 11.1–15.9)
Immature Grans (Abs): 0 10*3/uL (ref 0.0–0.1)
Immature Granulocytes: 0 %
Lymphocytes Absolute: 1.4 10*3/uL (ref 0.7–3.1)
Lymphs: 29 %
MCH: 25.7 pg — ABNORMAL LOW (ref 26.6–33.0)
MCHC: 30.3 g/dL — ABNORMAL LOW (ref 31.5–35.7)
MCV: 85 fL (ref 79–97)
Monocytes Absolute: 0.5 10*3/uL (ref 0.1–0.9)
Monocytes: 9 %
Neutrophils Absolute: 2.9 10*3/uL (ref 1.4–7.0)
Neutrophils: 59 %
Platelets: 317 10*3/uL (ref 150–450)
RBC: 4.7 x10E6/uL (ref 3.77–5.28)
RDW: 13.7 % (ref 11.7–15.4)
WBC: 4.9 10*3/uL (ref 3.4–10.8)

## 2022-05-21 LAB — LIPID PANEL
Chol/HDL Ratio: 2.1 ratio (ref 0.0–4.4)
Cholesterol, Total: 185 mg/dL (ref 100–199)
HDL: 88 mg/dL (ref >39)
LDL Chol Calc (NIH): 85 mg/dL (ref 0–99)
Triglycerides: 63 mg/dL (ref 0–149)
VLDL Cholesterol Cal: 12 mg/dL (ref 5–40)

## 2022-05-21 LAB — CERVICOVAGINAL ANCILLARY ONLY
Bacterial Vaginitis (gardnerella): NEGATIVE
Candida Glabrata: NEGATIVE
Candida Vaginitis: POSITIVE — AB
Chlamydia: NEGATIVE
Comment: NEGATIVE
Comment: NEGATIVE
Comment: NEGATIVE
Comment: NEGATIVE
Comment: NEGATIVE
Comment: NORMAL
Neisseria Gonorrhea: NEGATIVE
Trichomonas: NEGATIVE

## 2022-05-21 LAB — HEMOGLOBIN A1C
Est. average glucose Bld gHb Est-mCnc: 237 mg/dL
Hgb A1c MFr Bld: 9.9 % — ABNORMAL HIGH (ref 4.8–5.6)

## 2022-05-23 ENCOUNTER — Encounter: Payer: Self-pay | Admitting: Nurse Practitioner

## 2022-05-23 ENCOUNTER — Other Ambulatory Visit: Payer: Self-pay | Admitting: Nurse Practitioner

## 2022-05-23 MED ORDER — FLUCONAZOLE 150 MG PO TABS
150.0000 mg | ORAL_TABLET | ORAL | 2 refills | Status: DC
  Filled 2022-05-23: qty 3, 9d supply, fill #0

## 2022-05-26 ENCOUNTER — Ambulatory Visit: Payer: Self-pay | Admitting: Licensed Clinical Social Worker

## 2022-05-26 ENCOUNTER — Other Ambulatory Visit: Payer: Self-pay

## 2022-05-26 NOTE — Patient Instructions (Signed)
Visit Information  Thank you for taking time to visit with me today. Please don't hesitate to contact me if I can be of assistance to you before our next scheduled telephone appointment.  Following are the goals we discussed today:    Our next appointment is by telephone on 06/16/22 at 2:30 PM   Please call the care guide team at [phone] if you need to cancel or reschedule your appointment.   If you are experiencing a Mental Health or [place] or need someone to talk to, please go to [facility] [place] [phone])   Following is a copy of your full plan of care:   Intervention: Discussed program support with client Discussed client needs Discussed medication procurement of client. Discussed job status of client. She said she has started a part time job recently  Discussed mental health needs. She asked about community resources for counseling support. LCSW gave her names, phone numbers and addresses of following counseling agencies in [place], [place]:  [place] [facility]. Client interested in program support.  Client asked for pharmacist to call her to discuss DexCom G-6 and G-7 system. LCSW to contact CMA to ask CMA to schedule Pharmacist to call client to discuss client questions. Provided counseling support  Ms. Kujala was given information about Care Management services by the embedded care coordination team including:  Care Management services include personalized support from designated clinical staff supervised by her physician, including individualized plan of care and coordination with other care providers 24/7 contact phone numbers for assistance for urgent and routine care needs. The patient may stop CCM services at any time (effective at the end of the month) by phone call to the office staff.  Patient agreed to  services and verbal consent obtained.   Norva Riffle.Majid Mccravy MSW, [place] Holiday representative [facility] Care Management [phone]

## 2022-05-26 NOTE — Patient Outreach (Signed)
  Care Coordination   Initial Visit Note   05/26/2022 Name: [name] MRN: [id number] DOB: May 31, 1986  [name] is a 36 y.o. year old female who sees Gildardo Pounds, NP for primary care. I spoke with  Lenice Llamas by phone today.  What matters to the patients health and wellness today? Client requests that Pharmacist call her to discuss DexCom g-6 and G-7 system     Goals Addressed               This Visit's Progress     Patient Stated she would like information on managing DexCom 6, 7 CGM (pt-stated)        Intervention: Discussed program support with client Discussed client needs Discussed medication procurement of client. Discussed job status of client. She said she has started a part time job recently  Discussed mental health needs. She asked about community resources for counseling support. LCSW gave her names, phone numbers and addresses of following counseling agencies in [place], [place]:  [place] and [facility]. Client interested in program support.  Client asked for pharmacist to call her to discuss DexCom G-6 and G-7 system. LCSW to contact CMA to ask CMA to schedule Pharmacist to call client to discuss client questions. Provided counseling support     SDOH assessments and interventions completed:  Yes  SDOH Interventions Today    Flowsheet Row Most Recent Value  SDOH Interventions   Depression Interventions/Treatment  Counseling  Stress Interventions Provide Counseling  [stress related to managing medical needs]        Care Coordination Interventions Activated:  Yes  Care Coordination Interventions:  Yes, provided   Follow up plan: Follow up call scheduled for 06/16/22 at 2:30 PM     Encounter Outcome:  Pt. Visit Completed   Norva Riffle.Harvest Deist MSW, [place] Holiday representative [facility] Care Management [phone]

## 2022-05-27 ENCOUNTER — Ambulatory Visit: Payer: Self-pay | Admitting: Licensed Clinical Social Worker

## 2022-05-27 NOTE — Patient Outreach (Signed)
  Care Coordination   Follow Up Visit Note   05/27/2022 Name: OVA GILLENTINE MRN: [id number] DOB: 02-04-1986  DOREENA MAULDEN is a 36 y.o. year old female who sees Gildardo Pounds, NP for primary care. I emailed Theda Sers today regarding referral for Pharmacist for client, Ronasia Isola . [place]  What matters to the patients health and wellness today? Patient wants information on managing DexCom G-6 and G-7 system    Goals Addressed               This Visit's Progress     Patient Stated she would like information on managing DexCom 6, 7 CGM (pt-stated)        Intervention:  LCSW emailed Pharmacy referral to [facility] today to [employer]for client Client has asked for Pharmacist to call her to discuss DexCom G-6 and G-7 system. Client spoke of expense of using her current CGM system and is interested in talking with Pharmacist about CGM system.     SDOH assessments and interventions completed:  Yes     Care Coordination Interventions Activated:  Yes  Care Coordination Interventions:  Yes, provided   Follow up plan: Follow up call scheduled for LCSW and client on 06/16/22 at 2:30 PM     Encounter Outcome:  Pt. Visit Completed   Norva Riffle.Xavyer Steenson MSW, [place] Holiday representative [facility] Care Management [phone]

## 2022-05-27 NOTE — Patient Instructions (Signed)
Visit Information  Thank you for taking time to visit with me today. Please don't hesitate to contact me if I can be of assistance to you before our next scheduled telephone appointment.  Following are the goals we discussed today:   Our next appointment is by telephone on 06/16/22 at 2:30 PM   Please call the care guide team at [phone] if you need to cancel or reschedule your appointment.   If you are experiencing a Mental Health or [place] or need someone to talk to, please go to [facility] [place] [phone])   Following is a copy of your full plan of care:   Intervention:  LCSW emailed Pharmacy referral to [facility] today to [employer]for client Client has asked for Pharmacist to call her to discuss DexCom G-6 and G-7 system. Client spoke of expense of using her current CGM system and is interested in talking with Pharmacist about CGM system.  Ms. Deremer was given information about Care Management services by the embedded care coordination team including:  Care Management services include personalized support from designated clinical staff supervised by her physician, including individualized plan of care and coordination with other care providers 24/7 contact phone numbers for assistance for urgent and routine care needs. The patient may stop CCM services at any time (effective at the end of the month) by phone call to the office staff.  Patient agreed to services and verbal consent obtained.   Norva Riffle.Adir Schicker MSW, [place] Holiday representative [facility] Care Management [phone]

## 2022-05-30 ENCOUNTER — Other Ambulatory Visit: Payer: Self-pay

## 2022-06-11 ENCOUNTER — Other Ambulatory Visit: Payer: Self-pay | Admitting: Family Medicine

## 2022-06-11 DIAGNOSIS — E1065 Type 1 diabetes mellitus with hyperglycemia: Secondary | ICD-10-CM

## 2022-06-13 ENCOUNTER — Telehealth: Payer: Self-pay | Admitting: Pharmacist

## 2022-06-13 ENCOUNTER — Other Ambulatory Visit: Payer: Self-pay

## 2022-06-13 DIAGNOSIS — E1065 Type 1 diabetes mellitus with hyperglycemia: Secondary | ICD-10-CM

## 2022-06-13 MED ORDER — DEXCOM G7 RECEIVER DEVI
0 refills | Status: DC
  Filled 2022-06-13: qty 1, 30d supply, fill #0

## 2022-06-13 MED ORDER — DEXCOM G7 SENSOR MISC
2 refills | Status: DC
  Filled 2022-06-13: qty 3, 30d supply, fill #0

## 2022-06-13 MED ORDER — DEXCOM G6 RECEIVER DEVI: 0 refills | Status: AC

## 2022-06-13 MED ORDER — DEXCOM G6 SENSOR MISC: 2 refills | Status: AC

## 2022-06-13 MED ORDER — DEXCOM G6 TRANSMITTER MISC: 1 refills | Status: AC

## 2022-06-13 NOTE — Addendum Note (Signed)
Addended by: Daisy Blossom, Annie Main L on: 06/13/2022 04:01 PM   Modules accepted: Orders

## 2022-06-13 NOTE — Telephone Encounter (Addendum)
Received notification from [facility] that patient is needing to discuss Dexcom supplies.  She is wondering what our pricing would be for G6 supplies at [place]. Sent rxs to our pharmacy and found that she has a $0 copay for sensors, receiver, and transmitter. This is the [facility] [address]. [facility]. I called and informed her that [facility].

## 2022-06-16 ENCOUNTER — Ambulatory Visit: Payer: Self-pay | Admitting: Licensed Clinical Social Worker

## 2022-06-16 NOTE — Patient Outreach (Signed)
  Care Coordination   06/16/2022 Name: [name] MRN: [id number] DOB: Jan 29, 1986   Care Coordination Outreach Attempts:  An unsuccessful telephone outreach was attempted today to offer the patient information about available care coordination services as a benefit of their health plan.   Follow Up Plan:  Additional outreach attempts will be made to offer the patient care coordination information and services.   Encounter Outcome:  No Answer  Care Coordination Interventions Activated:  Yes   Care Coordination Interventions:  Yes, provided    Norva Riffle.Anuhea Gassner MSW, [place] Holiday representative [facility] Care Management [phone]

## 2022-06-16 NOTE — Patient Instructions (Signed)
Visit Information  Thank you for taking time to visit with me today. Please don't hesitate to contact me if I can be of assistance to you before our next scheduled telephone appointment.  Following are the goals we discussed today:   Our next appointment is by telephone on 07/14/22 at 3:00 PM   Please call the care guide team at [phone] if you need to cancel or reschedule your appointment.   If you are experiencing a Mental Health or [place] or need someone to talk to, please go to [facility] [place] [phone])   Following is a copy of your full plan of care:   Intervention:  Called client phone number today but was not able to speak via phone with client. LCSW left phone message for client asking her to call LCSW at [phone]  Ms. Macari was given information about Care Management services by the embedded care coordination team including:  Care Management services include personalized support from designated clinical staff supervised by her physician, including individualized plan of care and coordination with other care providers 24/7 contact phone numbers for assistance for urgent and routine care needs. The patient may stop CCM services at any time (effective at the end of the month) by phone call to the office staff.  Patient agreed to services and verbal consent obtained.   Norva Riffle.Ranon Coven MSW, [place] Holiday representative [facility] Care Management [phone]

## 2022-06-25 ENCOUNTER — Encounter (HOSPITAL_COMMUNITY): Payer: Self-pay | Admitting: Psychiatry

## 2022-06-25 ENCOUNTER — Ambulatory Visit (HOSPITAL_BASED_OUTPATIENT_CLINIC_OR_DEPARTMENT_OTHER): Payer: 59 | Admitting: Psychiatry

## 2022-06-25 VITALS — BP 130/88 | HR 85 | Temp 97.5°F | Ht 66.0 in | Wt 172.2 lb

## 2022-06-25 DIAGNOSIS — F321 Major depressive disorder, single episode, moderate: Secondary | ICD-10-CM | POA: Diagnosis not present

## 2022-06-25 DIAGNOSIS — F4310 Post-traumatic stress disorder, unspecified: Secondary | ICD-10-CM

## 2022-06-25 DIAGNOSIS — F411 Generalized anxiety disorder: Secondary | ICD-10-CM | POA: Diagnosis not present

## 2022-06-25 DIAGNOSIS — Z79899 Other long term (current) drug therapy: Secondary | ICD-10-CM | POA: Diagnosis not present

## 2022-06-25 MED ORDER — PROPRANOLOL HCL 10 MG PO TABS: 5.0000 mg | ORAL_TABLET | Freq: Two times a day (BID) | ORAL | 2 refills | Status: AC | PRN
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
## 2022-07-08 ENCOUNTER — Other Ambulatory Visit (HOSPITAL_COMMUNITY)
Admission: RE | Admit: 2022-07-08 | Discharge: 2022-07-08 | Disposition: A | Discharge status: Home / Self Care | Payer: 59 | Source: Ambulatory Visit | Attending: Nurse Practitioner | Admitting: Nurse Practitioner

## 2022-07-08 ENCOUNTER — Encounter: Payer: Self-pay | Admitting: Nurse Practitioner

## 2022-07-08 ENCOUNTER — Ambulatory Visit: Payer: 59 | Attending: Nurse Practitioner | Admitting: Nurse Practitioner

## 2022-07-08 VITALS — BP 123/73 | HR 78 | Ht 66.0 in | Wt 174.8 lb

## 2022-07-08 DIAGNOSIS — D1722 Benign lipomatous neoplasm of skin and subcutaneous tissue of left arm: Secondary | ICD-10-CM | POA: Diagnosis not present

## 2022-07-08 DIAGNOSIS — B3731 Acute candidiasis of vulva and vagina: Secondary | ICD-10-CM | POA: Diagnosis not present

## 2022-07-08 DIAGNOSIS — F419 Anxiety disorder, unspecified: Secondary | ICD-10-CM | POA: Insufficient documentation

## 2022-07-08 DIAGNOSIS — Z202 Contact with and (suspected) exposure to infections with a predominantly sexual mode of transmission: Secondary | ICD-10-CM | POA: Diagnosis not present

## 2022-07-08 DIAGNOSIS — L680 Hirsutism: Secondary | ICD-10-CM

## 2022-07-08 DIAGNOSIS — L659 Nonscarring hair loss, unspecified: Secondary | ICD-10-CM

## 2022-07-08 DIAGNOSIS — L730 Acne keloid: Secondary | ICD-10-CM

## 2022-07-08 DIAGNOSIS — R69 Illness, unspecified: Secondary | ICD-10-CM | POA: Diagnosis not present

## 2022-07-08 DIAGNOSIS — Z114 Encounter for screening for human immunodeficiency virus [HIV]: Secondary | ICD-10-CM

## 2022-07-08 MED ORDER — FLUCONAZOLE 150 MG PO TABS: 150.0000 mg | ORAL_TABLET | ORAL | 2 refills | Status: DC

## 2022-07-08 NOTE — Progress Notes (Signed)
Assessment & Plan:  Diagnoses and all orders for this visit:  Lipoma of left upper extremity -     Ambulatory referral to General Surgery  Female hirsutism -     Ambulatory referral to Gynecology  Hair thinning -     Ambulatory referral to Dermatology  Acne scarring -     Ambulatory referral to Dermatology  Encounter for screening for HIV -     HIV antibody (with reflex)  Encounter for assessment of STD exposure -     HIV antibody (with reflex) -     RPR -     Cervicovaginal ancillary only  Yeast vaginitis -     fluconazole (DIFLUCAN) 150 MG tablet; Take 1 tablet (150 mg total) by mouth every 3 (three) days.    Patient has been counseled on age-appropriate routine health concerns for screening and prevention. These are reviewed and up-to-date. Referrals have been placed accordingly. Immunizations are up-to-date or declined.    Subjective:  No chief complaint on file.  HPI [name] 36 y.o. female presents to office today with several concerns today.    HAIR THINNING Patient complains of hair loss.  The hair loss is localized in distribution, with onset approximately several months ago. Patient describes symptoms of hair shedding and hair thinning. Patient denies scalp itch, scalp pain, scalp rash/ lesions, scalp redness, scalp scaling, and scalp tenderness. Patient does not have family history of hair loss.  She has history of TYPE 1 DM. Patient does not wear a high tension hair style. Patient has not had serious medical illnesses or major weight loss during time of hair loss.   She has a lipoma in the left midaxillary line that she would like removed. There is some discomfort with palpation of the area at times and she feels the area has increased in size.  Acne: Patient presents for evaluation of acne.  Symptoms have progressed to a point and plateaued. Lesions are described as nodules and scars. Acne is primarily located on the face. The patient also describes  hirsutism has been noted. Treatment to date has included RetinA: ineffective      Review of Systems  Constitutional:  Negative for fever, malaise/fatigue and weight loss.  HENT: Negative.  Negative for nosebleeds.   Eyes: Negative.  Negative for blurred vision, double vision and photophobia.  Respiratory: Negative.  Negative for cough and shortness of breath.   Cardiovascular: Negative.  Negative for chest pain, palpitations and leg swelling.  Gastrointestinal: Negative.  Negative for heartburn, nausea and vomiting.  Genitourinary:        Increased vaginal discharge, itching, irritation  Musculoskeletal: Negative.  Negative for myalgias.  Skin:        SEE HPI  Neurological: Negative.  Negative for dizziness, focal weakness, seizures and headaches.  Psychiatric/Behavioral: Negative.  Negative for suicidal ideas.     Past Medical History:  Diagnosis Date   DKA, type 1 ([place])    Type 1 diabetes mellitus ([place])     History reviewed. No pertinent surgical history.  Family History  Problem Relation Age of Onset   Hypertension Mother    Throat cancer Mother    Thyroid disease Mother    Hypertension Father    Sleep apnea Father    Hypertension Sister    Diabetes Maternal Aunt     Social History Reviewed with no changes to be made today.   Outpatient Medications Prior to Visit  Medication Sig Dispense Refill   atorvastatin (LIPITOR)  10 MG tablet Take 1 tablet (10 mg total) by mouth daily. 90 tablet 1   Blood Glucose Monitoring Suppl (TRUE METRIX METER) w/Device KIT Use as directed 4 (four) times daily -  with meals and at bedtime. 1 kit 0   clindamycin (CLEOCIN T) 1 % external solution Apply 1 application topically at bedtime.     Continuous Blood Gluc Receiver (DEXCOM G6 RECEIVER) DEVI Use to check blood sugar TID. E10.65 1 each 0   Continuous Blood Gluc Sensor (DEXCOM G6 SENSOR) MISC Use to check blood sugar TID. E10.65 3 each 2   Continuous Blood Gluc Transmit (DEXCOM G6  TRANSMITTER) MISC Use to check blood sugar TID. E10.65 1 each 1   hydroquinone 4 % cream Apply 1 application topically at bedtime.     insulin aspart (FIASP FLEXTOUCH) 100 UNIT/ML FlexTouch Pen 1 unit per 15 grams carbohydrates plus sliding scale:  Add 1 unit per glucose 50 above 150.  Max Total Daily Dose:  20 units/day.     insulin aspart (NOVOLOG) 100 UNIT/ML injection Inject 100 Units into the skin daily.     Insulin Glargine (BASAGLAR KWIKPEN) 100 UNIT/ML Inject 16 Units into the skin daily. 15 mL 0   Insulin Pen Needle 31G X 5 MM MISC USE AS INSTRUCTED. INJECT INTO THE SKIN THREE TIMES DAILY 100 each 2   propranolol (INDERAL) 10 MG tablet Take 0.5-1 tablets (5-10 mg total) by mouth 2 (two) times daily as needed. 60 tablet 2   tretinoin (RETIN-A) 0.1 % cream Apply 1 application topically at bedtime.     fluconazole (DIFLUCAN) 150 MG tablet Take 1 tablet (150 mg total) by mouth every 3 (three) days. 3 tablet 2   No facility-administered medications prior to visit.    Allergies  Allergen Reactions   Lexapro [Escitalopram Oxalate]     NUMBNESS       Objective:    BP 123/73   Pulse 78   Ht _0  (1.676 m)   Wt 174 lb 12.8 oz (79.3 kg)   LMP 05/28/2022 (Approximate)   SpO2 100%   BMI 28.21 kg/m  Wt Readings from Last 3 Encounters:  07/08/22 174 lb 12.8 oz (79.3 kg)  02/21/22 162 lb (73.5 kg)  02/05/22 164 lb 3.2 oz (74.5 kg)    Physical Exam Vitals and nursing note reviewed.  Constitutional:      Appearance: She is well-developed.  HENT:     Head: Normocephalic and atraumatic.  Cardiovascular:     Rate and Rhythm: Normal rate and regular rhythm.     Heart sounds: Normal heart sounds. No murmur heard.    No friction rub. No gallop.  Pulmonary:     Effort: Pulmonary effort is normal. No tachypnea or respiratory distress.     Breath sounds: Normal breath sounds. No decreased breath sounds, wheezing, rhonchi or rales.  Chest:     Chest wall: No tenderness.  Abdominal:      General: Bowel sounds are normal.     Palpations: Abdomen is soft.  Musculoskeletal:        General: Normal range of motion.     Cervical back: Normal range of motion.  Skin:    General: Skin is warm and dry.  Neurological:     Mental Status: She is alert and oriented to person, place, and time.     Coordination: Coordination normal.  Psychiatric:        Behavior: Behavior normal. Behavior is cooperative.  Thought Content: Thought content normal.        Judgment: Judgment normal.          Patient has been counseled extensively about nutrition and exercise as well as the importance of adherence with medications and regular follow-up. The patient was given clear instructions to go to ER or return to medical center if symptoms don't improve, worsen or new problems develop. The patient verbalized understanding.   Follow-up: Return if symptoms worsen or fail to improve.   Gildardo Pounds, FNP-BC [facility] and [place], [place]   07/08/2022, 8:30 PM

## 2022-07-09 ENCOUNTER — Other Ambulatory Visit: Payer: Self-pay | Admitting: Family Medicine

## 2022-07-09 DIAGNOSIS — E1065 Type 1 diabetes mellitus with hyperglycemia: Secondary | ICD-10-CM

## 2022-07-09 LAB — CERVICOVAGINAL ANCILLARY ONLY
Bacterial Vaginitis (gardnerella): NEGATIVE
Candida Glabrata: NEGATIVE
Candida Vaginitis: NEGATIVE
Chlamydia: NEGATIVE
Comment: NEGATIVE
Comment: NEGATIVE
Comment: NEGATIVE
Comment: NEGATIVE
Comment: NEGATIVE
Comment: NORMAL
Neisseria Gonorrhea: NEGATIVE
Trichomonas: NEGATIVE

## 2022-07-09 LAB — HIV ANTIBODY (ROUTINE TESTING W REFLEX): HIV Screen 4th Generation wRfx: NONREACTIVE

## 2022-07-09 LAB — RPR: RPR Ser Ql: NONREACTIVE

## 2022-07-14 ENCOUNTER — Ambulatory Visit: Payer: Self-pay | Admitting: Licensed Clinical Social Worker

## 2022-07-14 NOTE — Patient Outreach (Signed)
  Care Coordination   Follow Up Visit Note   07/14/2022 Name: [name] MRN: [id number] DOB: [birth year]  [name] is a 36 y.o. year old female who sees Gildardo Pounds, NP for primary care. I spoke with  Lenice Llamas by phone today.  What matters to the patients health and wellness today? Patient stated previously she would like information on managing DexCom 6,7 CGM    Goals Addressed               This Visit's Progress     Patient Stated previously she would like information on managing DexCom 6, 7 CGM (pt-stated)        Intervention:  Called client phone number today and was able to speak via phone with client. Patient had stated previously she would like information on managing DexCom 6,7 CGM Pharmacist did contact client and talk with client about DexCom supplies for CGM. Client has history of mental health needs including diagnoses of PTSD, GAD and MDD.  Patient asked if she could return call to LCSW. LCSW agreed to this plan. LCSW is awaiting return call from client.        SDOH assessments and interventions completed:  Yes  SDOH Interventions Today    Flowsheet Row Most Recent Value  SDOH Interventions   Depression Interventions/Treatment  Currently on Treatment  Stress Interventions Other (Comment)  [client is currently on treatment for mental health needs]        Care Coordination Interventions:  Yes, provided   Follow up plan: Follow up call scheduled for 08/25/22 at 2:30 PM     Encounter Outcome:  Pt. Visit Completed   Norva Riffle.Abdi Husak MSW, [place] [employer] representative [facility] Care Management [phone]

## 2022-07-14 NOTE — Patient Instructions (Signed)
Visit Information  Thank you for taking time to visit with me today. Please don't hesitate to contact me if I can be of assistance to you before our next scheduled telephone appointment.  Following are the goals we discussed today:   Our next appointment is by telephone on 08/25/21 at 2:30 PM   Please call the care guide team at [phone] if you need to cancel or reschedule your appointment.   If you are experiencing a Mental Health or [place] or need someone to talk to, please go to [facility] [place] [phone])   Following is a copy of your full plan of care:   Intervention:  Called client phone number today and was able to speak via phone with client. Patient had stated previously she would like information on managing DexCom 6,7 CGM Pharmacist did contact client and talk with client about DexCom supplies for CGM. Client has history of mental health needs including diagnoses of PTSD, GAD and MDD.  Patient asked if she could return call to LCSW. LCSW agreed to this plan. LCSW is awaiting return call from client.   [name] was given information about Care Management services by the embedded care coordination team including:  Care Management services include personalized support from designated clinical staff supervised by her physician, including individualized plan of care and coordination with other care providers 24/7 contact phone numbers for assistance for urgent and routine care needs. The patient may stop CCM services at any time (effective at the end of the month) by phone call to the office staff.  Patient agreed to services and verbal consent obtained.   Norva Riffle.[name] MSW, [place] [employer] representative [facility] Care Management [phone]

## 2022-07-15 DIAGNOSIS — E109 Type 1 diabetes mellitus without complications: Secondary | ICD-10-CM | POA: Diagnosis not present

## 2022-07-23 ENCOUNTER — Other Ambulatory Visit (HOSPITAL_COMMUNITY): Payer: Self-pay | Admitting: *Deleted

## 2022-07-23 DIAGNOSIS — Z79899 Other long term (current) drug therapy: Secondary | ICD-10-CM

## 2022-08-06 ENCOUNTER — Other Ambulatory Visit: Payer: Self-pay

## 2022-08-11 ENCOUNTER — Telehealth: Payer: Self-pay

## 2022-08-11 MED ORDER — INSULIN GLARGINE-YFGN 100 UNIT/ML ~~LOC~~ SOLN: 16.0000 [IU] | Freq: Every day | SUBCUTANEOUS | 1 refills | Status: DC

## 2022-08-11 MED ORDER — INSULIN SYRINGES (DISPOSABLE) U-100 0.3 ML MISC: 0 refills | Status: AC

## 2022-08-11 NOTE — Addendum Note (Signed)
Addended by: Daisy Blossom, Annie Main L on: 08/11/2022 04:43 PM   Modules accepted: Orders

## 2022-08-11 NOTE — Telephone Encounter (Signed)
Patient's ins prefers Semglee YFGN. If appropriate, please consider changing Basaglar therapy.

## 2022-08-11 NOTE — Telephone Encounter (Signed)
Rx sent 

## 2022-08-25 ENCOUNTER — Ambulatory Visit: Payer: Self-pay | Admitting: Licensed Clinical Social Worker

## 2022-08-25 NOTE — Patient Outreach (Signed)
  Care Coordination   08/25/2022 Name: [name] MRN: [id number] DOB: [birth year]   Care Coordination Outreach Attempts:  An unsuccessful telephone outreach was attempted today to offer the patient information about available care coordination services as a benefit of their health plan.   Follow Up Plan:  Additional outreach attempts will be made to offer the patient care coordination information and services.   Encounter Outcome:  No Answer   Care Coordination Interventions:  No, not indicated    Norva Riffle.Herbert Aguinaldo MSW, LCSW Licensed Clinical Social Worker [place] Management [phone]

## 2022-09-29 DIAGNOSIS — L700 Acne vulgaris: Secondary | ICD-10-CM | POA: Diagnosis not present

## 2022-10-02 DIAGNOSIS — L700 Acne vulgaris: Secondary | ICD-10-CM | POA: Diagnosis not present

## 2022-10-15 ENCOUNTER — Telehealth: Payer: Self-pay | Admitting: *Deleted

## 2022-10-15 NOTE — Progress Notes (Signed)
  Care Coordination Note  10/15/2022 Name: [name] MRN: [id number] DOB: April 10, 1986  [name] is a 37 y.o. year old female who is a primary care patient of Gildardo Pounds, NP. I reached out to Lenice Llamas by phone today to assist with re-scheduling a follow up visit with the Licensed Clinical Social Worker  Follow up plan: Unsuccessful telephone outreach attempt made. A HIPAA compliant phone message was left for the patient providing contact information and requesting a return call.  [place]  Direct Dial: [phone]

## 2022-10-21 NOTE — Progress Notes (Signed)
  Care Coordination Note  10/21/2022 Name: VANI BORGESE MRN: [id number] DOB: 1985/08/03  [name] is a 37 y.o. year old female who is a primary care patient of Gildardo Pounds, NP. I reached out to Lenice Llamas by phone today to assist with re-scheduling a follow up visit with the Licensed Clinical Social Worker  Follow up plan: Unsuccessful telephone outreach attempt made. A HIPAA compliant phone message was left for the patient providing contact information and requesting a return call. We have been unable to make contact with the patient for follow up.    [place]  Direct Dial: [phone]

## 2022-10-31 DIAGNOSIS — L700 Acne vulgaris: Secondary | ICD-10-CM | POA: Diagnosis not present

## 2022-11-09 ENCOUNTER — Other Ambulatory Visit (HOSPITAL_COMMUNITY): Payer: Self-pay | Admitting: Psychiatry

## 2022-11-09 DIAGNOSIS — F411 Generalized anxiety disorder: Secondary | ICD-10-CM

## 2022-11-09 DIAGNOSIS — F4310 Post-traumatic stress disorder, unspecified: Secondary | ICD-10-CM

## 2022-11-12 DIAGNOSIS — Z0000 Encounter for general adult medical examination without abnormal findings: Secondary | ICD-10-CM | POA: Diagnosis not present

## 2022-11-12 DIAGNOSIS — D1730 Benign lipomatous neoplasm of skin and subcutaneous tissue of unspecified sites: Secondary | ICD-10-CM | POA: Insufficient documentation

## 2022-11-12 DIAGNOSIS — R140 Abdominal distension (gaseous): Secondary | ICD-10-CM | POA: Insufficient documentation

## 2022-11-12 DIAGNOSIS — Z01411 Encounter for gynecological examination (general) (routine) with abnormal findings: Secondary | ICD-10-CM | POA: Diagnosis not present

## 2022-11-12 DIAGNOSIS — Z124 Encounter for screening for malignant neoplasm of cervix: Secondary | ICD-10-CM | POA: Diagnosis not present

## 2022-11-12 DIAGNOSIS — Z113 Encounter for screening for infections with a predominantly sexual mode of transmission: Secondary | ICD-10-CM | POA: Diagnosis not present

## 2022-11-12 DIAGNOSIS — N6320 Unspecified lump in the left breast, unspecified quadrant: Secondary | ICD-10-CM | POA: Insufficient documentation

## 2022-11-12 DIAGNOSIS — L689 Hypertrichosis, unspecified: Secondary | ICD-10-CM | POA: Insufficient documentation

## 2022-11-13 ENCOUNTER — Other Ambulatory Visit: Payer: Self-pay

## 2022-11-13 ENCOUNTER — Encounter (HOSPITAL_COMMUNITY): Payer: Self-pay

## 2022-11-13 ENCOUNTER — Other Ambulatory Visit: Payer: Self-pay | Admitting: Obstetrics and Gynecology

## 2022-11-13 ENCOUNTER — Emergency Department (HOSPITAL_COMMUNITY)
Admission: EM | Admit: 2022-11-13 | Discharge: 2022-11-13 | Disposition: A | Discharge status: Home / Self Care | Payer: 59 | Attending: Emergency Medicine | Admitting: Emergency Medicine

## 2022-11-13 ENCOUNTER — Emergency Department (HOSPITAL_COMMUNITY): Payer: 59

## 2022-11-13 DIAGNOSIS — R55 Syncope and collapse: Secondary | ICD-10-CM | POA: Diagnosis not present

## 2022-11-13 DIAGNOSIS — N6320 Unspecified lump in the left breast, unspecified quadrant: Secondary | ICD-10-CM

## 2022-11-13 DIAGNOSIS — R9431 Abnormal electrocardiogram [ECG] [EKG]: Secondary | ICD-10-CM | POA: Diagnosis not present

## 2022-11-13 DIAGNOSIS — S0990XA Unspecified injury of head, initial encounter: Secondary | ICD-10-CM | POA: Diagnosis not present

## 2022-11-13 DIAGNOSIS — W500XXA Accidental hit or strike by another person, initial encounter: Secondary | ICD-10-CM | POA: Insufficient documentation

## 2022-11-13 DIAGNOSIS — L680 Hirsutism: Secondary | ICD-10-CM | POA: Insufficient documentation

## 2022-11-13 NOTE — ED Triage Notes (Signed)
Patient reports she was playing kickball last night and got elbowed in the head and then fell landing on head. Endorses +LOC and that her eyes were crossed for about 15 minutes. Endorses mild headache and some nausea. Denies any blood thinner use.

## 2022-11-13 NOTE — ED Provider Notes (Signed)
[place] EMERGENCY DEPARTMENT AT [facility] Provider Note   CSN: [id number] Arrival date & time: 11/13/22  1900     History  Chief Complaint  Patient presents with   Head Injury    [name] is a 37 y.o. female.  Patient reports that she was playing kickball last p.m. and was hit in the head with an elbow and knocked to the ground where she hit her head.  Patient reports she did lose consciousness.  Patient complains of a headache today.  Patient reports she had trouble concentrating at work today.  Patient denies any dizziness she denies any visual changes no hearing changes patient denies any neck pain  The history is provided by the patient. No language interpreter was used.  Head Injury Location:  Occipital Time since incident:  1 day Mechanism of injury: fall   Fall:    Impact surface:  Athletic surface Pain details:    Quality:  Aching   Timing:  Constant      Home Medications Prior to Admission medications   Medication Sig Start Date End Date Taking? Authorizing Provider  atorvastatin (LIPITOR) 10 MG tablet Take 1 tablet (10 mg total) by mouth daily. 02/05/22 02/05/23  Anders Simmonds, PA-C  Blood Glucose Monitoring Suppl (TRUE METRIX METER) w/Device KIT Use as directed 4 (four) times daily -  with meals and at bedtime. 03/01/21   Claiborne Rigg, NP  clindamycin (CLEOCIN T) 1 % external solution Apply 1 application topically at bedtime. 06/27/21   [provider]  Continuous Blood Gluc Receiver (DEXCOM G6 RECEIVER) DEVI Use to check blood sugar TID. E10.65 06/13/22   Hoy Register, MD  Continuous Blood Gluc Sensor (DEXCOM G6 SENSOR) MISC Use to check blood sugar TID. E10.65 06/13/22   Hoy Register, MD  Continuous Blood Gluc Transmit (DEXCOM G6 TRANSMITTER) MISC Use to check blood sugar TID. E10.65 06/13/22   Hoy Register, MD  fluconazole (DIFLUCAN) 150 MG tablet Take 1 tablet (150 mg total) by mouth every 3 (three) days. 07/08/22    Claiborne Rigg, NP  hydroquinone 4 % cream Apply 1 application topically at bedtime.    [provider]  insulin aspart (FIASP FLEXTOUCH) 100 UNIT/ML FlexTouch Pen 1 unit per 15 grams carbohydrates plus sliding scale:  Add 1 unit per glucose 50 above 150.  Max Total Daily Dose:  20 units/day. 02/11/22   [provider]  insulin aspart (NOVOLOG) 100 UNIT/ML injection Inject 100 Units into the skin daily. 05/26/22   [provider]  insulin glargine-yfgn (SEMGLEE, YFGN,) 100 UNIT/ML injection Inject 0.16 mLs (16 Units total) into the skin daily. 08/11/22   Hoy Register, MD  Insulin Pen Needle 31G X 5 MM MISC USE AS INSTRUCTED. INJECT INTO THE SKIN THREE TIMES DAILY 02/05/22 02/05/23  Anders Simmonds, PA-C  Insulin Syringes, Disposable, U-100 0.3 ML MISC Use to inject Semglee insulin once daily. 08/11/22   Hoy Register, MD  propranolol (INDERAL) 10 MG tablet Take 0.5-1 tablets (5-10 mg total) by mouth 2 (two) times daily as needed. 06/25/22   Stasia Cavalier, MD  tretinoin (RETIN-A) 0.1 % cream Apply 1 application topically at bedtime.    [provider]      Allergies    Lexapro [escitalopram oxalate]    Review of Systems   Review of Systems  All other systems reviewed and are negative.   Physical Exam Updated Vital Signs BP 115/70   Pulse 95   Temp  99.7 F (37.6 C) (Oral)   Resp 18   Ht  (1.676 m)   Wt 73.5 kg   SpO2 100%   BMI 26.15 kg/m  Physical Exam Vitals and nursing note reviewed.  Constitutional:      Appearance: She is well-developed.  HENT:     Head: Normocephalic.     Right Ear: Tympanic membrane normal.     Left Ear: Tympanic membrane normal.     Nose: Nose normal.     Mouth/Throat:     Mouth: Mucous membranes are moist.  Eyes:     Extraocular Movements: Extraocular movements intact.     Pupils: Pupils are equal, round, and reactive to light.  Cardiovascular:     Rate and Rhythm: Normal rate.  Pulmonary:      Effort: Pulmonary effort is normal.  Abdominal:     General: There is no distension.  Musculoskeletal:        General: Normal range of motion.     Cervical back: Normal range of motion.  Skin:    General: Skin is warm.  Neurological:     General: No focal deficit present.     Mental Status: She is alert and oriented to person, place, and time.  Psychiatric:        Mood and Affect: Mood normal.     ED Results / Procedures / Treatments   Labs (all labs ordered are listed, but only abnormal results are displayed) Labs Reviewed - No data to display  EKG None  Radiology CT Head Wo Contrast  Result Date: 11/13/2022 CLINICAL DATA:  Head trauma, moderate-severe Elbowed in the head leading to fall playing kickball last night. Positive loss of consciousness. EXAM: CT HEAD WITHOUT CONTRAST TECHNIQUE: Contiguous axial images were obtained from the base of the skull through the vertex without intravenous contrast. RADIATION DOSE REDUCTION: This exam was performed according to the departmental dose-optimization program which includes automated exposure control, adjustment of the mA and/or kV according to patient size and/or use of iterative reconstruction technique. COMPARISON:  Head CT 09/13/2021 FINDINGS: Brain: No intracranial hemorrhage, mass effect, or midline shift. No hydrocephalus. The basilar cisterns are patent. No evidence of territorial infarct or acute ischemia. No extra-axial or intracranial fluid collection. Vascular: No hyperdense vessel or unexpected calcification. Skull: Normal. Negative for fracture or focal lesion. Sinuses/Orbits: Paranasal sinuses and mastoid air cells are clear. The visualized orbits are unremarkable. Other: None. IMPRESSION: Negative noncontrast head CT. Electronically Signed   By: Narda Rutherford M.D.   On: 11/13/2022 20:41    Procedures Procedures    Medications Ordered in ED Medications - No data to display  ED Course/ Medical Decision Making/ A&P                              Medical Decision Making Complains of hitting her head.  Last p.m. patient complains of a continued headache.  Patient reports that she did lose consciousness  Amount and/or Complexity of Data Reviewed Radiology: ordered and independent interpretation performed. Decision-making details documented in ED Course.    Details: ET head shows no acute abnormality  Risk Risk Details: Patient advised Tylenol every 4 hours for discomfort.  Patient is advised to follow-up with primary care if any problems           Final Clinical Impression(s) / ED Diagnoses Final diagnoses:  Injury of head, initial encounter  Minor head injury, initial encounter  Rx / DC Orders ED Discharge Orders     None     An After Visit Summary was printed and given to the patient.     Elson Areas, Cordelia Poche 11/13/22 2216    Glyn Ade, MD 11/14/22 1454

## 2022-11-26 ENCOUNTER — Other Ambulatory Visit: Payer: 59

## 2022-12-02 DIAGNOSIS — K130 Diseases of lips: Secondary | ICD-10-CM | POA: Diagnosis not present

## 2022-12-02 DIAGNOSIS — L853 Xerosis cutis: Secondary | ICD-10-CM | POA: Diagnosis not present

## 2022-12-02 DIAGNOSIS — L700 Acne vulgaris: Secondary | ICD-10-CM | POA: Diagnosis not present

## 2022-12-09 ENCOUNTER — Ambulatory Visit
Admission: RE | Admit: 2022-12-09 | Discharge: 2022-12-09 | Disposition: A | Discharge status: Home / Self Care | Payer: 59 | Source: Ambulatory Visit | Attending: Obstetrics and Gynecology | Admitting: Obstetrics and Gynecology

## 2022-12-09 ENCOUNTER — Other Ambulatory Visit: Payer: Self-pay | Admitting: Obstetrics and Gynecology

## 2022-12-09 DIAGNOSIS — N6320 Unspecified lump in the left breast, unspecified quadrant: Secondary | ICD-10-CM

## 2022-12-09 DIAGNOSIS — N63 Unspecified lump in unspecified breast: Secondary | ICD-10-CM | POA: Diagnosis not present

## 2022-12-09 DIAGNOSIS — D1722 Benign lipomatous neoplasm of skin and subcutaneous tissue of left arm: Secondary | ICD-10-CM | POA: Diagnosis not present

## 2022-12-10 DIAGNOSIS — R140 Abdominal distension (gaseous): Secondary | ICD-10-CM | POA: Diagnosis not present

## 2023-01-05 DIAGNOSIS — F419 Anxiety disorder, unspecified: Secondary | ICD-10-CM | POA: Diagnosis not present

## 2023-01-05 DIAGNOSIS — F320 Major depressive disorder, single episode, mild: Secondary | ICD-10-CM | POA: Diagnosis not present

## 2023-01-05 DIAGNOSIS — Z808 Family history of malignant neoplasm of other organs or systems: Secondary | ICD-10-CM | POA: Diagnosis not present

## 2023-01-05 DIAGNOSIS — L700 Acne vulgaris: Secondary | ICD-10-CM | POA: Diagnosis not present

## 2023-01-05 DIAGNOSIS — K130 Diseases of lips: Secondary | ICD-10-CM | POA: Diagnosis not present

## 2023-01-05 DIAGNOSIS — M791 Myalgia, unspecified site: Secondary | ICD-10-CM | POA: Diagnosis not present

## 2023-01-05 DIAGNOSIS — Z794 Long term (current) use of insulin: Secondary | ICD-10-CM | POA: Diagnosis not present

## 2023-01-05 DIAGNOSIS — Z8249 Family history of ischemic heart disease and other diseases of the circulatory system: Secondary | ICD-10-CM | POA: Diagnosis not present

## 2023-01-05 DIAGNOSIS — L853 Xerosis cutis: Secondary | ICD-10-CM | POA: Diagnosis not present

## 2023-01-05 DIAGNOSIS — E785 Hyperlipidemia, unspecified: Secondary | ICD-10-CM | POA: Diagnosis not present

## 2023-01-05 DIAGNOSIS — E282 Polycystic ovarian syndrome: Secondary | ICD-10-CM | POA: Diagnosis not present

## 2023-01-05 DIAGNOSIS — E119 Type 2 diabetes mellitus without complications: Secondary | ICD-10-CM | POA: Diagnosis not present

## 2023-01-08 ENCOUNTER — Encounter (HOSPITAL_COMMUNITY): Payer: Self-pay | Admitting: Internal Medicine

## 2023-01-08 ENCOUNTER — Inpatient Hospital Stay (HOSPITAL_COMMUNITY)
Admission: EM | Admit: 2023-01-08 | Discharge: 2023-01-10 | DRG: 638 | Disposition: A | Discharge status: Home / Self Care | Payer: 59 | Attending: Internal Medicine | Admitting: Internal Medicine

## 2023-01-08 ENCOUNTER — Other Ambulatory Visit: Payer: Self-pay

## 2023-01-08 DIAGNOSIS — Z79899 Other long term (current) drug therapy: Secondary | ICD-10-CM

## 2023-01-08 DIAGNOSIS — E1110 Type 2 diabetes mellitus with ketoacidosis without coma: Secondary | ICD-10-CM | POA: Diagnosis present

## 2023-01-08 DIAGNOSIS — E871 Hypo-osmolality and hyponatremia: Secondary | ICD-10-CM | POA: Diagnosis present

## 2023-01-08 DIAGNOSIS — D179 Benign lipomatous neoplasm, unspecified: Secondary | ICD-10-CM | POA: Insufficient documentation

## 2023-01-08 DIAGNOSIS — E1010 Type 1 diabetes mellitus with ketoacidosis without coma: Principal | ICD-10-CM | POA: Diagnosis present

## 2023-01-08 DIAGNOSIS — L709 Acne, unspecified: Secondary | ICD-10-CM | POA: Insufficient documentation

## 2023-01-08 DIAGNOSIS — E876 Hypokalemia: Secondary | ICD-10-CM | POA: Diagnosis not present

## 2023-01-08 DIAGNOSIS — Z743 Need for continuous supervision: Secondary | ICD-10-CM | POA: Diagnosis not present

## 2023-01-08 DIAGNOSIS — R110 Nausea: Secondary | ICD-10-CM | POA: Diagnosis not present

## 2023-01-08 DIAGNOSIS — D75838 Other thrombocytosis: Secondary | ICD-10-CM | POA: Diagnosis present

## 2023-01-08 DIAGNOSIS — Z794 Long term (current) use of insulin: Secondary | ICD-10-CM

## 2023-01-08 DIAGNOSIS — Z888 Allergy status to other drugs, medicaments and biological substances status: Secondary | ICD-10-CM

## 2023-01-08 DIAGNOSIS — R112 Nausea with vomiting, unspecified: Secondary | ICD-10-CM | POA: Diagnosis not present

## 2023-01-08 DIAGNOSIS — E875 Hyperkalemia: Secondary | ICD-10-CM | POA: Diagnosis present

## 2023-01-08 DIAGNOSIS — N179 Acute kidney failure, unspecified: Secondary | ICD-10-CM | POA: Diagnosis present

## 2023-01-08 DIAGNOSIS — R739 Hyperglycemia, unspecified: Secondary | ICD-10-CM | POA: Diagnosis not present

## 2023-01-08 DIAGNOSIS — D72829 Elevated white blood cell count, unspecified: Secondary | ICD-10-CM | POA: Diagnosis present

## 2023-01-08 DIAGNOSIS — D75839 Thrombocytosis, unspecified: Secondary | ICD-10-CM | POA: Diagnosis present

## 2023-01-08 DIAGNOSIS — Z808 Family history of malignant neoplasm of other organs or systems: Secondary | ICD-10-CM

## 2023-01-08 DIAGNOSIS — R531 Weakness: Secondary | ICD-10-CM | POA: Diagnosis not present

## 2023-01-08 DIAGNOSIS — E1069 Type 1 diabetes mellitus with other specified complication: Secondary | ICD-10-CM | POA: Diagnosis present

## 2023-01-08 DIAGNOSIS — E785 Hyperlipidemia, unspecified: Secondary | ICD-10-CM | POA: Insufficient documentation

## 2023-01-08 DIAGNOSIS — Z8349 Family history of other endocrine, nutritional and metabolic diseases: Secondary | ICD-10-CM

## 2023-01-08 DIAGNOSIS — Z833 Family history of diabetes mellitus: Secondary | ICD-10-CM

## 2023-01-08 DIAGNOSIS — Z8249 Family history of ischemic heart disease and other diseases of the circulatory system: Secondary | ICD-10-CM

## 2023-01-08 DIAGNOSIS — R000 Tachycardia, unspecified: Secondary | ICD-10-CM | POA: Diagnosis not present

## 2023-01-08 LAB — GLUCOSE, CAPILLARY
Glucose-Capillary: 165 mg/dL — ABNORMAL HIGH (ref 70–99)
Glucose-Capillary: 175 mg/dL — ABNORMAL HIGH (ref 70–99)
Glucose-Capillary: 157 mg/dL — ABNORMAL HIGH (ref 70–99)
Glucose-Capillary: 175 mg/dL — ABNORMAL HIGH (ref 70–99)
Glucose-Capillary: 168 mg/dL — ABNORMAL HIGH (ref 70–99)
Glucose-Capillary: 161 mg/dL — ABNORMAL HIGH (ref 70–99)
Glucose-Capillary: 170 mg/dL — ABNORMAL HIGH (ref 70–99)
Glucose-Capillary: 158 mg/dL — ABNORMAL HIGH (ref 70–99)

## 2023-01-08 LAB — BLOOD GAS, VENOUS
Acid-base deficit: 27 mmol/L — ABNORMAL HIGH (ref 0.0–2.0)
Bicarbonate: 3.7 mmol/L — ABNORMAL LOW (ref 20.0–28.0)
O2 Saturation: 89.5 %
Patient temperature: 37
pCO2, Ven: <18 mmHg — CL (ref 44–60)
pH, Ven: 6.95 — CL (ref 7.25–7.43)
pO2, Ven: 59 mmHg — ABNORMAL HIGH (ref 32–45)

## 2023-01-08 LAB — CBG MONITORING, ED
Glucose-Capillary: 411 mg/dL — ABNORMAL HIGH (ref 70–99)
Glucose-Capillary: 381 mg/dL — ABNORMAL HIGH (ref 70–99)
Glucose-Capillary: 380 mg/dL — ABNORMAL HIGH (ref 70–99)
Glucose-Capillary: 273 mg/dL — ABNORMAL HIGH (ref 70–99)
Glucose-Capillary: 195 mg/dL — ABNORMAL HIGH (ref 70–99)

## 2023-01-08 LAB — BASIC METABOLIC PANEL
Anion gap: 16 — ABNORMAL HIGH (ref 5–15)
Anion gap: 13 (ref 5–15)
Anion gap: 11 (ref 5–15)
BUN: 18 mg/dL (ref 6–20)
BUN: 15 mg/dL (ref 6–20)
BUN: 15 mg/dL (ref 6–20)
BUN: 12 mg/dL (ref 6–20)
CO2: <7 mmol/L — ABNORMAL LOW (ref 22–32)
CO2: 7 mmol/L — ABNORMAL LOW (ref 22–32)
CO2: 9 mmol/L — ABNORMAL LOW (ref 22–32)
CO2: 14 mmol/L — ABNORMAL LOW (ref 22–32)
Calcium: 9.1 mg/dL (ref 8.9–10.3)
Calcium: 8.5 mg/dL — ABNORMAL LOW (ref 8.9–10.3)
Calcium: 8.5 mg/dL — ABNORMAL LOW (ref 8.9–10.3)
Calcium: 8.7 mg/dL — ABNORMAL LOW (ref 8.9–10.3)
Chloride: 105 mmol/L (ref 98–111)
Chloride: 112 mmol/L — ABNORMAL HIGH (ref 98–111)
Chloride: 115 mmol/L — ABNORMAL HIGH (ref 98–111)
Chloride: 110 mmol/L (ref 98–111)
Creatinine, Ser: 1.19 mg/dL — ABNORMAL HIGH (ref 0.44–1.00)
Creatinine, Ser: 0.91 mg/dL (ref 0.44–1.00)
Creatinine, Ser: 0.96 mg/dL (ref 0.44–1.00)
Creatinine, Ser: 0.78 mg/dL (ref 0.44–1.00)
GFR, Estimated: >60 mL/min (ref >60)
GFR, Estimated: >60 mL/min (ref >60)
GFR, Estimated: >60 mL/min (ref >60)
GFR, Estimated: >60 mL/min (ref >60)
Glucose, Bld: 409 mg/dL — ABNORMAL HIGH (ref 70–99)
Glucose, Bld: 190 mg/dL — ABNORMAL HIGH (ref 70–99)
Glucose, Bld: 190 mg/dL — ABNORMAL HIGH (ref 70–99)
Glucose, Bld: 173 mg/dL — ABNORMAL HIGH (ref 70–99)
Potassium: 5.7 mmol/L — ABNORMAL HIGH (ref 3.5–5.1)
Potassium: 4.4 mmol/L (ref 3.5–5.1)
Potassium: 4.5 mmol/L (ref 3.5–5.1)
Potassium: 4.1 mmol/L (ref 3.5–5.1)
Sodium: 131 mmol/L — ABNORMAL LOW (ref 135–145)
Sodium: 135 mmol/L (ref 135–145)
Sodium: 137 mmol/L (ref 135–145)
Sodium: 135 mmol/L (ref 135–145)

## 2023-01-08 LAB — I-STAT CHEM 8, ED
BUN: 23 mg/dL — ABNORMAL HIGH (ref 6–20)
Calcium, Ion: 1.26 mmol/L (ref 1.15–1.40)
Chloride: 112 mmol/L — ABNORMAL HIGH (ref 98–111)
Creatinine, Ser: 0.6 mg/dL (ref 0.44–1.00)
Glucose, Bld: 405 mg/dL — ABNORMAL HIGH (ref 70–99)
HCT: 45 % (ref 36.0–46.0)
Hemoglobin: 15.3 g/dL — ABNORMAL HIGH (ref 12.0–15.0)
Potassium: 6.3 mmol/L (ref 3.5–5.1)
Sodium: 134 mmol/L — ABNORMAL LOW (ref 135–145)
TCO2: 6 mmol/L — ABNORMAL LOW (ref 22–32)

## 2023-01-08 LAB — CBC WITH DIFFERENTIAL/PLATELET
Abs Immature Granulocytes: 0.15 10*3/uL — ABNORMAL HIGH (ref 0.00–0.07)
Basophils Absolute: 0.1 10*3/uL (ref 0.0–0.1)
Basophils Relative: 1 %
Eosinophils Absolute: 0 10*3/uL (ref 0.0–0.5)
Eosinophils Relative: 0 %
HCT: 47 % — ABNORMAL HIGH (ref 36.0–46.0)
Hemoglobin: 13.7 g/dL (ref 12.0–15.0)
Immature Granulocytes: 1 %
Lymphocytes Relative: 7 %
Lymphs Abs: 1.2 10*3/uL (ref 0.7–4.0)
MCH: 26 pg (ref 26.0–34.0)
MCHC: 29.1 g/dL — ABNORMAL LOW (ref 30.0–36.0)
MCV: 89.2 fL (ref 80.0–100.0)
Monocytes Absolute: 1.1 10*3/uL — ABNORMAL HIGH (ref 0.1–1.0)
Monocytes Relative: 7 %
Neutro Abs: 13.7 10*3/uL — ABNORMAL HIGH (ref 1.7–7.7)
Neutrophils Relative %: 84 %
Platelets: 472 10*3/uL — ABNORMAL HIGH (ref 150–400)
RBC: 5.27 MIL/uL — ABNORMAL HIGH (ref 3.87–5.11)
RDW: 15.2 % (ref 11.5–15.5)
WBC: 16.3 10*3/uL — ABNORMAL HIGH (ref 4.0–10.5)
nRBC: 0 % (ref 0.0–0.2)

## 2023-01-08 LAB — URINALYSIS, ROUTINE W REFLEX MICROSCOPIC
Bilirubin Urine: NEGATIVE
Glucose, UA: >=500 mg/dL — AB
Hgb urine dipstick: NEGATIVE
Ketones, ur: 80 mg/dL — AB
Leukocytes,Ua: NEGATIVE
Nitrite: NEGATIVE
Protein, ur: 100 mg/dL — AB
Specific Gravity, Urine: 1.015 (ref 1.005–1.030)
pH: 5 (ref 5.0–8.0)

## 2023-01-08 LAB — BETA-HYDROXYBUTYRIC ACID
Beta-Hydroxybutyric Acid: >8 mmol/L — ABNORMAL HIGH (ref 0.05–0.27)
Beta-Hydroxybutyric Acid: 4.87 mmol/L — ABNORMAL HIGH (ref 0.05–0.27)

## 2023-01-08 LAB — MRSA NEXT GEN BY PCR, NASAL: MRSA by PCR Next Gen: NOT DETECTED

## 2023-01-08 LAB — HEMOGLOBIN A1C
Hgb A1c MFr Bld: 12.9 % — ABNORMAL HIGH (ref 4.8–5.6)
Mean Plasma Glucose: 323.53 mg/dL

## 2023-01-08 LAB — I-STAT BETA HCG BLOOD, ED (MC, WL, AP ONLY): I-stat hCG, quantitative: <5 m[IU]/mL (ref <5)

## 2023-01-08 MED ORDER — HYDROMORPHONE HCL 1 MG/ML IJ SOLN: 1.0000 mg | INTRAMUSCULAR | Status: DC | PRN

## 2023-01-08 MED ORDER — PROCHLORPERAZINE EDISYLATE 10 MG/2ML IJ SOLN
10.0000 mg | Freq: Once | INTRAMUSCULAR | Status: AC
  Administered 2023-01-08: 10 mg via INTRAVENOUS
  Filled 2023-01-08: qty 2

## 2023-01-08 MED ORDER — ENOXAPARIN SODIUM 40 MG/0.4ML IJ SOSY
40.0000 mg | PREFILLED_SYRINGE | INTRAMUSCULAR | Status: DC
  Administered 2023-01-08: 40 mg via SUBCUTANEOUS
  Filled 2023-01-08 (×2): qty 0.4

## 2023-01-08 MED ORDER — LACTATED RINGERS IV BOLUS
2000.0000 mL | Freq: Once | INTRAVENOUS | Status: AC
  Administered 2023-01-08: 2000 mL via INTRAVENOUS

## 2023-01-08 MED ORDER — INSULIN REGULAR(HUMAN) IN NACL 100-0.9 UT/100ML-% IV SOLN
INTRAVENOUS | Status: DC
  Administered 2023-01-08: 8.5 [IU]/h via INTRAVENOUS
  Filled 2023-01-08: qty 100

## 2023-01-08 MED ORDER — ONDANSETRON HCL 4 MG/2ML IJ SOLN
4.0000 mg | Freq: Once | INTRAMUSCULAR | Status: AC
  Administered 2023-01-08: 4 mg via INTRAVENOUS
  Filled 2023-01-08: qty 2

## 2023-01-08 MED ORDER — ACETAMINOPHEN 650 MG RE SUPP: 650.0000 mg | Freq: Four times a day (QID) | RECTAL | Status: DC | PRN

## 2023-01-08 MED ORDER — DEXTROSE 50 % IV SOLN: 0.0000 mL | INTRAVENOUS | Status: DC | PRN

## 2023-01-08 MED ORDER — ORAL CARE MOUTH RINSE: 15.0000 mL | OROMUCOSAL | Status: DC | PRN

## 2023-01-08 MED ORDER — ONDANSETRON HCL 4 MG/2ML IJ SOLN: 4.0000 mg | Freq: Four times a day (QID) | INTRAMUSCULAR | Status: DC | PRN

## 2023-01-08 MED ORDER — DEXTROSE IN LACTATED RINGERS 5 % IV SOLN: INTRAVENOUS | Status: DC

## 2023-01-08 MED ORDER — CHLORHEXIDINE GLUCONATE CLOTH 2 % EX PADS
6.0000 | MEDICATED_PAD | Freq: Every day | CUTANEOUS | Status: DC
  Administered 2023-01-08 – 2023-01-10 (×3): 6 via TOPICAL

## 2023-01-08 MED ORDER — LACTATED RINGERS IV BOLUS
20.0000 mL/kg | Freq: Once | INTRAVENOUS | Status: AC
  Administered 2023-01-08: 1434 mL via INTRAVENOUS

## 2023-01-08 MED ORDER — LACTATED RINGERS IV SOLN: INTRAVENOUS | Status: DC

## 2023-01-08 MED ORDER — PANTOPRAZOLE SODIUM 40 MG IV SOLR
40.0000 mg | Freq: Once | INTRAVENOUS | Status: AC
  Administered 2023-01-08: 40 mg via INTRAVENOUS
  Filled 2023-01-08: qty 10

## 2023-01-08 MED ORDER — HYDROMORPHONE HCL 1 MG/ML IJ SOLN
1.0000 mg | Freq: Once | INTRAMUSCULAR | Status: AC
  Administered 2023-01-08: 1 mg via INTRAVENOUS
  Filled 2023-01-08: qty 1

## 2023-01-08 MED ORDER — ATORVASTATIN CALCIUM 10 MG PO TABS
10.0000 mg | ORAL_TABLET | Freq: Every day | ORAL | Status: DC
  Administered 2023-01-10: 10 mg via ORAL
  Filled 2023-01-08 (×2): qty 1

## 2023-01-08 MED ORDER — MORPHINE SULFATE (PF) 4 MG/ML IV SOLN
4.0000 mg | Freq: Once | INTRAVENOUS | Status: AC
  Administered 2023-01-08: 4 mg via INTRAVENOUS
  Filled 2023-01-08: qty 1

## 2023-01-08 MED ORDER — ACETAMINOPHEN 325 MG PO TABS
650.0000 mg | ORAL_TABLET | Freq: Four times a day (QID) | ORAL | Status: DC | PRN
  Administered 2023-01-09 – 2023-01-10 (×3): 650 mg via ORAL
  Filled 2023-01-08 (×3): qty 2

## 2023-01-08 MED ORDER — ONDANSETRON HCL 4 MG PO TABS: 4.0000 mg | ORAL_TABLET | Freq: Four times a day (QID) | ORAL | Status: DC | PRN

## 2023-01-08 NOTE — ED Triage Notes (Signed)
Pt BIBA with c/o DKA. CBG 443. Insulin pump is not functioning properly. Polydipsia at present. Periumbilical pain and nausea. Had 2 units of insulin this morning.   BP 154/86 HR 120 RR 30

## 2023-01-08 NOTE — ED Notes (Signed)
ED TO INPATIENT HANDOFF REPORT  Name/Age/Gender [name] 37 y.o. female  Code Status    Code Status Orders  (From admission, onward)           Start     Ordered   01/08/23 1149  Full code  Continuous       Question:  By:  Answer:  Consent: discussion documented in EHR   01/08/23 1149           Code Status History     Date Active Date Inactive Code Status Order ID Comments User Context   05/31/2019 2227 06/03/2019 1801 Full Code [id number]  Pearson Grippe, MD ED   04/16/2018 0046 04/18/2018 1835 Full Code [id number]  Carron Curie, MD Inpatient   12/26/2017 1420 12/28/2017 1654 Full Code [id number]  Narda Bonds, MD ED   05/19/2017 1036 05/20/2017 2036 Full Code [id number]  Carron Curie, MD Inpatient   04/25/2016 0203 04/26/2016 1645 Full Code [id number]  Eduard Clos, MD ED   02/10/2013 1520 02/12/2013 2126 Full Code [id number]  Dorothea Ogle, MD Inpatient   07/11/2012 1710 07/14/2012 1631 Full Code [id number]  Cheek, Milton Ferguson, RN Inpatient       Home/SNF/Other Home  Chief Complaint DKA, type 1 (HCC) [E10.10]  Level of Care/Admitting Diagnosis ED Disposition     ED Disposition  Admit   Condition  --   Comment  Hospital Area: [facility] [[id number]]  Level of Care: Stepdown [14]  Admit to SDU based on following criteria: Severe physiological/psychological symptoms:  Any diagnosis requiring assessment & intervention at least every 4 hours on an ongoing basis to obtain desired patient outcomes including stability and rehabilitation  [facility] patient in [facility] at [facility] or [facility] if equivalent level of care is available:: No  Covid Evaluation: Asymptomatic - no recent exposure (last 10 days) testing not required  Diagnosis: DKA, type 1 [facility]) [[id number]]  Admitting Physician: Bobette Mo [[id number]]  Attending Physician: Bobette Mo [phone]          Medical History Past Medical History:  Diagnosis Date    DKA, type 1 (HCC)    Type 1 diabetes mellitus (HCC)     Allergies Allergies  Allergen Reactions   Lexapro [Escitalopram Oxalate]     NUMBNESS    IV Location/Drains/Wounds Patient Lines/Drains/Airways Status     Active Line/Drains/Airways     Name Placement date Placement time Site Days   Peripheral IV 01/08/23 20 G 2.5" Anterior;Distal;Left;Upper Arm 01/08/23  1001  Arm  less than 1   Peripheral IV 01/08/23 22 G 2.5" Right Antecubital 01/08/23  1103  Antecubital  less than 1            Labs/Imaging Results for orders placed or performed during the hospital encounter of 01/08/23 (from the past 48 hour(s))  CBG monitoring, ED     Status: Abnormal   Collection Time: 01/08/23  9:26 AM  Result Value Ref Range   Glucose-Capillary 411 (H) 70 - 99 mg/dL    Comment: Glucose reference range applies only to samples taken after fasting for at least 8 hours.  Basic metabolic panel     Status: Abnormal   Collection Time: 01/08/23 10:03 AM  Result Value Ref Range   Sodium 131 (L) 135 - 145 mmol/L   Potassium 5.7 (H) 3.5 - 5.1 mmol/L   Chloride 105 98 - 111 mmol/L   CO2 <7 (L) 22 - 32 mmol/L   Glucose,  Bld 409 (H) 70 - 99 mg/dL    Comment: Glucose reference range applies only to samples taken after fasting for at least 8 hours.   BUN 18 6 - 20 mg/dL   Creatinine, Ser 3.47 (H) 0.44 - 1.00 mg/dL   Calcium 9.1 8.9 - 42.5 mg/dL   GFR, Estimated >95 >63 mL/min    Comment: (NOTE) Calculated using the CKD-EPI Creatinine Equation (2021)    Anion gap NOT CALCULATED 5 - 15    Comment: Performed at [facility], [address], [place], [place] [address]  Beta-hydroxybutyric acid     Status: Abnormal   Collection Time: 01/08/23 10:03 AM  Result Value Ref Range   Beta-Hydroxybutyric Acid >8.00 (H) 0.05 - 0.27 mmol/L    Comment: RESULT CONFIRMED BY MANUAL DILUTION Performed at [facility], [address], [place], [place] [address]   CBC with  Differential (PNL)     Status: Abnormal   Collection Time: 01/08/23 10:03 AM  Result Value Ref Range   WBC 16.3 (H) 4.0 - 10.5 K/uL   RBC 5.27 (H) 3.87 - 5.11 MIL/uL   Hemoglobin 13.7 12.0 - 15.0 g/dL   HCT 18.8 (H) 41.6 - 60.6 %   MCV 89.2 80.0 - 100.0 fL   MCH 26.0 26.0 - 34.0 pg   MCHC 29.1 (L) 30.0 - 36.0 g/dL   RDW 30.1 60.1 - 09.3 %   Platelets 472 (H) 150 - 400 K/uL   nRBC 0.0 0.0 - 0.2 %   Neutrophils Relative % 84 %   Neutro Abs 13.7 (H) 1.7 - 7.7 K/uL   Lymphocytes Relative 7 %   Lymphs Abs 1.2 0.7 - 4.0 K/uL   Monocytes Relative 7 %   Monocytes Absolute 1.1 (H) 0.1 - 1.0 K/uL   Eosinophils Relative 0 %   Eosinophils Absolute 0.0 0.0 - 0.5 K/uL   Basophils Relative 1 %   Basophils Absolute 0.1 0.0 - 0.1 K/uL   Immature Granulocytes 1 %   Abs Immature Granulocytes 0.15 (H) 0.00 - 0.07 K/uL    Comment: Performed at [facility], [address] [place], [place] [address]  Blood gas, venous     Status: Abnormal   Collection Time: 01/08/23 10:03 AM  Result Value Ref Range   pH, Ven 6.95 (LL) 7.25 - 7.43    Comment: CRITICAL RESULT CALLED TO, READ BACK BY AND VERIFIED WITH: KENT,Q. RN AT 1023 01/08/23 MULLINS,T    pCO2, Ven <18 (LL) 44 - 60 mmHg    Comment: CRITICAL RESULT CALLED TO, READ BACK BY AND VERIFIED WITH: KENT,Q. RN AT 1023 01/08/23 MULLINS,T    pO2, Ven 59 (H) 32 - 45 mmHg   Bicarbonate 3.7 (L) 20.0 - 28.0 mmol/L   Acid-base deficit 27.0 (H) 0.0 - 2.0 mmol/L   O2 Saturation 89.5 %   Patient temperature 37.0     Comment: Performed at [facility], [address] [place], [place] [address]  Hemoglobin A1c     Status: Abnormal   Collection Time: 01/08/23 10:03 AM  Result Value Ref Range   Hgb A1c MFr Bld 12.9 (H) 4.8 - 5.6 %    Comment: (NOTE) Pre diabetes:          5.7%-6.4%  Diabetes:              >6.4%  Glycemic control for   <7.0% adults with diabetes    Mean Plasma Glucose 323.53 mg/dL  Comment:  Performed at [facility], [address] [place], [place] [address]  I-Stat beta hCG blood, ED     Status: None   Collection Time: 01/08/23 10:09 AM  Result Value Ref Range   I-stat hCG, quantitative <5.0 <5 mIU/mL   Comment 3            Comment:   GEST. AGE      CONC.  (mIU/mL)   <=1 WEEK        5 - 50     2 WEEKS       50 - 500     3 WEEKS       100 - 10,000     4 WEEKS     1,000 - 30,000        FEMALE AND NON-PREGNANT FEMALE:     LESS THAN 5 mIU/mL   CBG monitoring, ED     Status: Abnormal   Collection Time: 01/08/23 11:04 AM  Result Value Ref Range   Glucose-Capillary 381 (H) 70 - 99 mg/dL    Comment: Glucose reference range applies only to samples taken after fasting for at least 8 hours.  I-stat chem 8, ED     Status: Abnormal   Collection Time: 01/08/23 11:34 AM  Result Value Ref Range   Sodium 134 (L) 135 - 145 mmol/L   Potassium 6.3 (HH) 3.5 - 5.1 mmol/L   Chloride 112 (H) 98 - 111 mmol/L   BUN 23 (H) 6 - 20 mg/dL   Creatinine, Ser 6.04 0.44 - 1.00 mg/dL   Glucose, Bld 540 (H) 70 - 99 mg/dL    Comment: Glucose reference range applies only to samples taken after fasting for at least 8 hours.   Calcium, Ion 1.26 1.15 - 1.40 mmol/L   TCO2 6 (L) 22 - 32 mmol/L   Hemoglobin 15.3 (H) 12.0 - 15.0 g/dL   HCT 98.1 19.1 - 47.8 %   Comment NOTIFIED PHYSICIAN   CBG monitoring, ED     Status: Abnormal   Collection Time: 01/08/23 11:59 AM  Result Value Ref Range   Glucose-Capillary 380 (H) 70 - 99 mg/dL    Comment: Glucose reference range applies only to samples taken after fasting for at least 8 hours.  Urinalysis, Routine w reflex microscopic -Urine, Clean Catch     Status: Abnormal   Collection Time: 01/08/23 12:12 PM  Result Value Ref Range   Color, Urine STRAW (A) YELLOW   APPearance CLEAR CLEAR   Specific Gravity, Urine 1.015 1.005 - 1.030   pH 5.0 5.0 - 8.0   Glucose, UA >=500 (A) NEGATIVE mg/dL   Hgb urine dipstick NEGATIVE NEGATIVE   Bilirubin Urine NEGATIVE  NEGATIVE   Ketones, ur 80 (A) NEGATIVE mg/dL   Protein, ur 295 (A) NEGATIVE mg/dL   Nitrite NEGATIVE NEGATIVE   Leukocytes,Ua NEGATIVE NEGATIVE   RBC / HPF 0-5 0 - 5 RBC/hpf   WBC, UA 0-5 0 - 5 WBC/hpf   Bacteria, UA RARE (A) NONE SEEN   Squamous Epithelial / HPF 0-5 0 - 5 /HPF   Mucus PRESENT     Comment: Performed at [facility], [address], [place], [place] [address]  CBG monitoring, ED     Status: Abnormal   Collection Time: 01/08/23  1:16 PM  Result Value Ref Range   Glucose-Capillary 273 (H) 70 - 99 mg/dL    Comment: Glucose reference range applies only to samples taken after fasting for  at least 8 hours.  CBG monitoring, ED     Status: Abnormal   Collection Time: 01/08/23  2:28 PM  Result Value Ref Range   Glucose-Capillary 195 (H) 70 - 99 mg/dL    Comment: Glucose reference range applies only to samples taken after fasting for at least 8 hours.   No results found.  Pending Labs Unresulted Labs (From admission, onward)     Start     Ordered   01/09/23 0500  CBC  Tomorrow morning,   R        01/08/23 1149   01/09/23 0500  Comprehensive metabolic panel  Tomorrow morning,   R        01/08/23 1149   01/08/23 0924  Basic metabolic panel  (Diabetes Ketoacidosis (DKA))  STAT Now then every 4 hours ,   STAT      01/08/23 0924   01/08/23 0924  Beta-hydroxybutyric acid  (Diabetes Ketoacidosis (DKA))  Now then every 8 hours,   STAT (with TIMED, URGENT occurrences)      01/08/23 0924            Vitals/Pain Today's Vitals   01/08/23 1254 01/08/23 1303 01/08/23 1357 01/08/23 1445  BP: (!) 141/76   127/62  Pulse: (!) 136   (!) 124  Resp:    (!) 23  Temp:  97.7 F (36.5 C)    TempSrc:  Oral    SpO2: 100%   100%  Weight:      Height:      PainSc:   Asleep     Isolation Precautions No active isolations  Medications Medications  lactated ringers infusion (0 mLs Intravenous Stopped 01/08/23 1434)  insulin regular, human (MYXREDLIN) 100 units/  100 mL infusion (8.5 Units/hr Intravenous New Bag/Given 01/08/23 1125)  dextrose 5 % in lactated ringers infusion ( Intravenous New Bag/Given 01/08/23 1432)  dextrose 50 % solution 0-50 mL (has no administration in time range)  enoxaparin (LOVENOX) injection 40 mg (has no administration in time range)  acetaminophen (TYLENOL) tablet 650 mg (has no administration in time range)    Or  acetaminophen (TYLENOL) suppository 650 mg (has no administration in time range)  ondansetron (ZOFRAN) tablet 4 mg (has no administration in time range)    Or  ondansetron (ZOFRAN) injection 4 mg (has no administration in time range)  atorvastatin (LIPITOR) tablet 10 mg (has no administration in time range)  lactated ringers bolus 1,434 mL (0 mLs Intravenous Stopped 01/08/23 1130)  ondansetron (ZOFRAN) injection 4 mg (4 mg Intravenous Given 01/08/23 1026)  morphine (PF) 4 MG/ML injection 4 mg (4 mg Intravenous Given 01/08/23 1057)  pantoprazole (PROTONIX) injection 40 mg (40 mg Intravenous Given 01/08/23 1256)  lactated ringers bolus 2,000 mL (0 mLs Intravenous Stopped 01/08/23 1434)  prochlorperazine (COMPAZINE) injection 10 mg (10 mg Intravenous Given 01/08/23 1256)  HYDROmorphone (DILAUDID) injection 1 mg (1 mg Intravenous Given 01/08/23 1324)    Mobility walks

## 2023-01-08 NOTE — ED Notes (Signed)
Pt was given ice chips

## 2023-01-08 NOTE — H&P (Addendum)
History and Physical    Patient: [name]:[id number] DOB: 1986/06/21 DOA: 01/08/2023 DOS: the patient was seen and examined on 01/08/2023 PCP: Claiborne Rigg, NP  Patient coming from: Home  Chief Complaint:  Chief Complaint  Patient presents with   Diabetic Ketoacidosis   HPI: [name] is a 37 y.o. female with medical history significant of type 1 diabetes, multiple episodes of DKA who is coming with a 2-day history of abdominal pain, nausea and multiple episodes of emesis associated with polyuria, polydipsia and blurry vision.  She denied fever, chills, rhinorrhea, sore throat, wheezing or hemoptysis.  No chest pain, palpitations, diaphoresis, PND, orthopnea or pitting edema of the lower extremities.  No abdominal pain, nausea, emesis, diarrhea, constipation, melena or hematochezia.  No flank pain, dysuria, frequency or hematuria.    Lab work: Urine analysis showed glucose greater than 500, ketones of 80 and protein of 100 mg/dL.  There was rare bacteria microscopic examination. CBC showed a white count of 16.3, hemoglobin 13.7 g deciliter platelets 172.  I-STAT hCG was negative.  Venous blood gas showed a pH of 6.95, pCO2 less than 18 and pO2 59 mmHg.  Bicarbonate was 3.7 and acid-base deficit 27.0 mmol/L.  BMP showed a potassium of 5.7 and CO2 less than 7 mmol/L with a noncalculated anion gap.  Glucose 409, BUN 18 creatinine 1.19 mg/dL.  Normal calcium, chloride and sodium after correction.  ED course: Initial vital signs were temperature 98.1 F, pulse 124, respirations 26, BP 149/92 mmHg O2 sat 100% on room air.  The patient was started on an insulin infusion, received 1434 mL of LR bolus, morphine 4 mg IVP and ondansetron 4 mg IVP.  I added hydromorphone 1 mg IVP, pantoprazole 40 mg IVP and prochlorperazine 10 mg IVP.Marland Kitchen   Review of Systems: As mentioned in the history of present illness. All other systems reviewed and are negative. Past Medical History:  Diagnosis Date    DKA, type 1 (HCC)    Type 1 diabetes mellitus (HCC)    No past surgical history on file. Social History:  reports that she has never smoked. She has been exposed to tobacco smoke. She has never used smokeless tobacco. She reports current alcohol use. She reports that she does not use drugs.  Allergies  Allergen Reactions   Lexapro [Escitalopram Oxalate]     NUMBNESS    Family History  Problem Relation Age of Onset   Hypertension Mother    Throat cancer Mother    Thyroid disease Mother    Hypertension Father    Sleep apnea Father    Hypertension Sister    Diabetes Maternal Aunt     Prior to Admission medications   Medication Sig Start Date End Date Taking? Authorizing Provider  atorvastatin (LIPITOR) 10 MG tablet Take 1 tablet (10 mg total) by mouth daily. 02/05/22 02/05/23  Anders Simmonds, PA-C  Blood Glucose Monitoring Suppl (TRUE METRIX METER) w/Device KIT Use as directed 4 (four) times daily -  with meals and at bedtime. 03/01/21   Claiborne Rigg, NP  clindamycin (CLEOCIN T) 1 % external solution Apply 1 application topically at bedtime. 06/27/21   [provider]  Continuous Blood Gluc Receiver (DEXCOM G6 RECEIVER) DEVI Use to check blood sugar TID. E10.65 06/13/22   Hoy Register, MD  Continuous Blood Gluc Sensor (DEXCOM G6 SENSOR) MISC Use to check blood sugar TID. E10.65 06/13/22   Hoy Register, MD  Continuous Blood Gluc Transmit (DEXCOM G6 TRANSMITTER)  MISC Use to check blood sugar TID. E10.65 06/13/22   Hoy Register, MD  fluconazole (DIFLUCAN) 150 MG tablet Take 1 tablet (150 mg total) by mouth every 3 (three) days. 07/08/22   Claiborne Rigg, NP  hydroquinone 4 % cream Apply 1 application topically at bedtime.    [provider]  insulin aspart (FIASP FLEXTOUCH) 100 UNIT/ML FlexTouch Pen 1 unit per 15 grams carbohydrates plus sliding scale:  Add 1 unit per glucose 50 above 150.  Max Total Daily Dose:  20 units/day. 02/11/22   [provider]  insulin aspart (NOVOLOG) 100 UNIT/ML injection Inject 100 Units into the skin daily. 05/26/22   [provider]  insulin glargine-yfgn (SEMGLEE, YFGN,) 100 UNIT/ML injection Inject 0.16 mLs (16 Units total) into the skin daily. 08/11/22   Hoy Register, MD  Insulin Pen Needle 31G X 5 MM MISC USE AS INSTRUCTED. INJECT INTO THE SKIN THREE TIMES DAILY 02/05/22 02/05/23  Anders Simmonds, PA-C  Insulin Syringes, Disposable, U-100 0.3 ML MISC Use to inject Semglee insulin once daily. 08/11/22   Hoy Register, MD  propranolol (INDERAL) 10 MG tablet Take 0.5-1 tablets (5-10 mg total) by mouth 2 (two) times daily as needed. 06/25/22   Stasia Cavalier, MD  tretinoin (RETIN-A) 0.1 % cream Apply 1 application topically at bedtime.    [provider]    Physical Exam: Vitals:   01/08/23 0937 01/08/23 1054 01/08/23 1105 01/08/23 1110  BP:   117/81   Pulse:  (!) 129 (!) 122 (!) 119  Resp:   (!) 21   Temp:      TempSrc:      SpO2: 100% 100% 100% 100%  Weight:      Height:       Physical Exam Vitals and nursing note reviewed.  Constitutional:      General: She is awake. She is not in acute distress.    Appearance: Normal appearance.  HENT:     Head: Normocephalic.     Nose: No rhinorrhea.     Mouth/Throat:     Mouth: Mucous membranes are dry.  Eyes:     General: No scleral icterus.    Pupils: Pupils are equal, round, and reactive to light.  Cardiovascular:     Rate and Rhythm: Normal rate and regular rhythm.  Pulmonary:     Effort: Pulmonary effort is normal.     Breath sounds: Normal breath sounds.  Abdominal:     General: Bowel sounds are normal. There is no distension.     Palpations: Abdomen is soft.     Tenderness: There is abdominal tenderness.  Musculoskeletal:     Cervical back: Neck supple.     Right lower leg: No edema.     Left lower leg: No edema.  Skin:    General: Skin is warm and dry.     Comments: Extensive areas of healing acne  lesions.  Neurological:     General: No focal deficit present.     Mental Status: She is alert and oriented to person, place, and time.  Psychiatric:        Mood and Affect: Mood normal.        Behavior: Behavior normal. Behavior is cooperative.   Data Reviewed:  Results are pending, will review when available.  Assessment and Plan: Principal Problem:   DKA, type 1 (HCC) Observation/stepdown. Keep NPO. Continue IV fluids. Continue insulin infusion. Monitor CBG closely. BMP every 4 hours. BHA every 8 hours.  Replace electrolytes as needed. Consult diabetes coordinator. Transition to SQ insulin per Endo tool.  Active Problems:   AKI (acute kidney injury) (HCC)  Continue IV fluids. Avoid hypotension. Avoid nephrotoxins. Monitor intake and output. Monitor renal function and electrolytes.    Hyperkalemia In the setting of severe metabolic acidosis. Continue IV fluids and insulin infusion. Monitor potassium level closely.    Leukocytosis Hemoconcentration/DKA. Continue current therapy. Follow WBC in the morning.    Thrombocytosis  Secondary to hemoconcentration.   Continue IV fluids. Follow-up platelet count in the morning.    Hyperlipidemia due to type 1 diabetes mellitus (HCC) Continue atorvastatin 10 mg p.o. daily.      Advance Care Planning:   Code Status: Full Code   Consults:   Family Communication:   Severity of Illness: The appropriate patient status for this patient is OBSERVATION. Observation status is judged to be reasonable and necessary in order to provide the required intensity of service to ensure the patient's safety. The patient's presenting symptoms, physical exam findings, and initial radiographic and laboratory data in the context of their medical condition is felt to place them at decreased risk for further clinical deterioration. Furthermore, it is anticipated that the patient will be medically stable for discharge from the hospital within  2 midnights of admission.   Author: Bobette Mo, MD 01/08/2023 11:44 AM  For on call review [email or web address].   This document was prepared using Dragon voice recognition software and may contain some unintended transcription errors.

## 2023-01-08 NOTE — Inpatient Diabetes Management (Signed)
Inpatient Diabetes Program Recommendations  AACE/ADA: New Consensus Statement on Inpatient Glycemic Control (2015)  Target Ranges:  Prepandial:   less than 140 mg/dL      Peak postprandial:   less than 180 mg/dL (1-2 hours)      Critically ill patients:  140 - 180 mg/dL   Lab Results  Component Value Date   GLUCAP 195 (H) 01/08/2023   HGBA1C 12.9 (H) 01/08/2023    Review of Glycemic Control  Diabetes history: DM1 Outpatient Diabetes medications: Basaglar 12 units QHS, Novolog 1:15 TID and 1 unit per 50 above 150 Current orders for Inpatient glycemic control: IV insulin per EndoTool for DKA  HgbA1C - 12.9%  Inpatient Diabetes Program Recommendations:    Continue IV insulin until criteria met for discontinuation of drip.   When criteria met:  Give Semglee 10 units 1-2H prior to discontinuation of drip  Novolog 0-9 units TID with meals and 0-5 HS + 4 units TID with meals if eating > 50%  Pt usually on insulin pump, has been waiting for supplies. States she was late ordering d/t cost and uses Insurance account manager while off pump.  Endo is [employer]. Last apppt 10/21/22. Encouraged pt to contact Endo regarding not being able to afford the iLet pump. Has Dexcom and uses at home. Pt states she got food poisoning from [place] sand[place] eaten at a friend's house. Did not miss Basaglar dose. Took Novolog also, but couldn't get blood sugars to come down. Encouraged pt to speak with Endo about trouble with affording the insulin pump supplies. Just received results of HgbA1C - 12.9% (average blood sugar 324 mg/dL!!) Will speak with pt in am regarding these results.   Awaiting room in ICU/SD. Discussed above with RN.  Thank you. Ailene Ards, RD, LDN, CDCES Inpatient Diabetes Coordinator [phone]

## 2023-01-08 NOTE — ED Provider Notes (Signed)
[facility] EMERGENCY DEPARTMENT AT [facility] Provider Note   CSN: [id number] Arrival date & time: 01/08/23  7829     History  Chief Point: Vomiting and diarrhea, high blood sugar  [name] is a 37 y.o. female.  HPI   Patient has a history of diabetes and DKA.  Patient states she ran out of supplies for her insulin pump.  Patient has been using her insulin and and long-acting insulin shots.  Patient states her blood sugars have been running high over the last several days.  She started developing abdominal pain nausea that she attributes to food poisoning a couple days ago.  Patient states she has had multiple episodes of nausea vomiting.  She is having weakness and decreased p.o. intake.  Her mouth is very dry.  She has felt very thirsty.  Patient states she is aching all over.  No known fevers.  No known falls or recent injuries.  Patient called EMS this morning.  Home Medications Prior to Admission medications   Medication Sig Start Date End Date Taking? Authorizing Provider  acetaminophen (TYLENOL) 500 MG tablet Take 1,500 mg by mouth every 6 (six) hours as needed for moderate pain.   Yes [provider]  atorvastatin (LIPITOR) 10 MG tablet Take 1 tablet (10 mg total) by mouth daily. 02/05/22 02/05/23 Yes McClung, Marzella Schlein, PA-C  hydroquinone 4 % cream Apply 1 application  topically at bedtime as needed (Hyperpigmentation).   Yes [provider]  hydrOXYzine (ATARAX) 50 MG tablet Take 50 mg by mouth 3 (three) times daily as needed for anxiety.   Yes [provider]  insulin aspart (FIASP FLEXTOUCH) 100 UNIT/ML FlexTouch Pen Inject 0-20 Units into the skin 3 (three) times daily. Load 180 units into pump every 3 days. 02/11/22  Yes [provider]  insulin aspart (NOVOLOG) 100 UNIT/ML injection Inject 0-12 Units into the skin 3 (three) times daily with meals. Sliding scale 05/26/22  Yes [provider]  Insulin Glargine  (BASAGLAR KWIKPEN) 100 UNIT/ML Inject 12 Units into the skin at bedtime. 12/18/22  Yes [provider]  ISOtretinoin (ACCUTANE) 40 MG capsule Take 40 mg by mouth 2 (two) times daily.   Yes [provider]  JUNEL 1/20 1-20 MG-MCG tablet Take 1 tablet by mouth daily. 12/24/22  Yes [provider]  propranolol (INDERAL) 10 MG tablet Take 0.5-1 tablets (5-10 mg total) by mouth 2 (two) times daily as needed. Patient taking differently: Take 5-10 mg by mouth 2 (two) times daily as needed (Anxiety). 06/25/22  Yes Stasia Cavalier, MD  tretinoin (RETIN-A) 0.1 % cream Apply 1 application  topically at bedtime as needed (Acne).   Yes [provider]  Blood Glucose Monitoring Suppl (TRUE METRIX METER) w/Device KIT Use as directed 4 (four) times daily -  with meals and at bedtime. 03/01/21   Claiborne Rigg, NP  Continuous Blood Gluc Receiver (DEXCOM G6 RECEIVER) DEVI Use to check blood sugar TID. E10.65 06/13/22   Hoy Register, MD  Continuous Blood Gluc Sensor (DEXCOM G6 SENSOR) MISC Use to check blood sugar TID. E10.65 06/13/22   Hoy Register, MD  Continuous Blood Gluc Transmit (DEXCOM G6 TRANSMITTER) MISC Use to check blood sugar TID. E10.65 06/13/22   Hoy Register, MD  fluconazole (DIFLUCAN) 150 MG tablet Take 1 tablet (150 mg total) by mouth every 3 (three) days. Patient not taking: Reported on 01/08/2023 07/08/22   Claiborne Rigg, NP  insulin glargine-yfgn (SEMGLEE, YFGN,) 100  UNIT/ML injection Inject 0.16 mLs (16 Units total) into the skin daily. Patient not taking: Reported on 01/08/2023 08/11/22   Hoy Register, MD  Insulin Pen Needle 31G X 5 MM MISC USE AS INSTRUCTED. INJECT INTO THE SKIN THREE TIMES DAILY 02/05/22 02/05/23  Anders Simmonds, PA-C  Insulin Syringes, Disposable, U-100 0.3 ML MISC Use to inject Semglee insulin once daily. 08/11/22   Hoy Register, MD      Allergies    Lexapro [escitalopram oxalate]    Review of Systems   Review of  Systems  Physical Exam Updated Vital Signs BP (!) 141/76   Pulse (!) 136   Temp 97.7 F (36.5 C) (Oral)   Resp (!) 21   Ht 1.676 m (5\' 6" )   Wt 71.7 kg   SpO2 100%   BMI 25.50 kg/m  Physical Exam Vitals and nursing note reviewed.  Constitutional:      General: She is in acute distress.     Appearance: She is well-developed. She is ill-appearing.  HENT:     Head: Normocephalic and atraumatic.     Right Ear: External ear normal.     Left Ear: External ear normal.  Eyes:     General: No scleral icterus.       Right eye: No discharge.        Left eye: No discharge.     Conjunctiva/sclera: Conjunctivae normal.  Neck:     Trachea: No tracheal deviation.  Cardiovascular:     Rate and Rhythm: Regular rhythm. Tachycardia present.  Pulmonary:     Effort: Pulmonary effort is normal. No respiratory distress.     Breath sounds: Normal breath sounds. No stridor. No wheezing or rales.  Abdominal:     General: Bowel sounds are normal. There is no distension.     Palpations: Abdomen is soft.     Tenderness: There is no abdominal tenderness. There is no guarding or rebound.  Musculoskeletal:        General: No tenderness or deformity.     Cervical back: Neck supple.  Skin:    General: Skin is warm and dry.     Findings: No rash.  Neurological:     General: No focal deficit present.     Mental Status: She is alert.     Cranial Nerves: No cranial nerve deficit, dysarthria or facial asymmetry.     Sensory: No sensory deficit.     Motor: No abnormal muscle tone or seizure activity.     Coordination: Coordination normal.  Psychiatric:        Mood and Affect: Mood normal.     ED Results / Procedures / Treatments   Labs (all labs ordered are listed, but only abnormal results are displayed) Labs Reviewed  BASIC METABOLIC PANEL - Abnormal; Notable for the following components:      Result Value   Sodium 131 (*)    Potassium 5.7 (*)    CO2 <7 (*)    Glucose, Bld 409 (*)     Creatinine, Ser 1.19 (*)    All other components within normal limits  BETA-HYDROXYBUTYRIC ACID - Abnormal; Notable for the following components:   Beta-Hydroxybutyric Acid >8.00 (*)    All other components within normal limits  CBC WITH DIFFERENTIAL/PLATELET - Abnormal; Notable for the following components:   WBC 16.3 (*)    RBC 5.27 (*)    HCT 47.0 (*)    MCHC 29.1 (*)    Platelets 472 (*)  Neutro Abs 13.7 (*)    Monocytes Absolute 1.1 (*)    Abs Immature Granulocytes 0.15 (*)    All other components within normal limits  URINALYSIS, ROUTINE W REFLEX MICROSCOPIC - Abnormal; Notable for the following components:   Color, Urine STRAW (*)    Glucose, UA >=500 (*)    Ketones, ur 80 (*)    Protein, ur 100 (*)    Bacteria, UA RARE (*)    All other components within normal limits  BLOOD GAS, VENOUS - Abnormal; Notable for the following components:   pH, Ven 6.95 (*)    pCO2, Ven <18 (*)    pO2, Ven 59 (*)    Bicarbonate 3.7 (*)    Acid-base deficit 27.0 (*)    All other components within normal limits  HEMOGLOBIN A1C - Abnormal; Notable for the following components:   Hgb A1c MFr Bld 12.9 (*)    All other components within normal limits  CBG MONITORING, ED - Abnormal; Notable for the following components:   Glucose-Capillary 411 (*)    All other components within normal limits  I-STAT CHEM 8, ED - Abnormal; Notable for the following components:   Sodium 134 (*)    Potassium 6.3 (*)    Chloride 112 (*)    BUN 23 (*)    Glucose, Bld 405 (*)    TCO2 6 (*)    Hemoglobin 15.3 (*)    All other components within normal limits  CBG MONITORING, ED - Abnormal; Notable for the following components:   Glucose-Capillary 381 (*)    All other components within normal limits  CBG MONITORING, ED - Abnormal; Notable for the following components:   Glucose-Capillary 380 (*)    All other components within normal limits  CBG MONITORING, ED - Abnormal; Notable for the following components:    Glucose-Capillary 273 (*)    All other components within normal limits  BASIC METABOLIC PANEL  BASIC METABOLIC PANEL  BASIC METABOLIC PANEL  BETA-HYDROXYBUTYRIC ACID  BASIC METABOLIC PANEL  BETA-HYDROXYBUTYRIC ACID  I-STAT BETA HCG BLOOD, ED ([facility], [facility], [facility] ONLY)    EKG EKG Interpretation  Date/Time:  Thursday January 08 2023 09:21:28 EDT Ventricular Rate:  122 PR Interval:  141 QRS Duration: 80 QT Interval:  322 QTC Calculation: 457 R Axis:   -27 Text Interpretation: Sinus tachycardia Multiple ventricular premature complexes Since last tracing rate faster Confirmed by Linwood Dibbles [phone]) on 01/08/2023 10:34:32 AM  Radiology No results found.  Procedures .Critical Care  Performed by: Linwood Dibbles, MD Authorized by: Linwood Dibbles, MD   Critical care provider statement:    Critical care time (minutes):  45   Critical care was time spent personally by me on the following activities:  Development of treatment plan with patient or surrogate, discussions with consultants, evaluation of patient's response to treatment, examination of patient, ordering and review of laboratory studies, ordering and review of radiographic studies, ordering and performing treatments and interventions, pulse oximetry, re-evaluation of patient's condition and review of old charts     Medications Ordered in ED Medications  lactated ringers infusion ( Intravenous New Bag/Given 01/08/23 1126)  insulin regular, human (MYXREDLIN) 100 units/ 100 mL infusion (8.5 Units/hr Intravenous New Bag/Given 01/08/23 1125)  dextrose 5 % in lactated ringers infusion (has no administration in time range)  dextrose 50 % solution 0-50 mL (has no administration in time range)  enoxaparin (LOVENOX) injection 40 mg (has no administration in time range)  acetaminophen (TYLENOL) tablet 650 mg (  has no administration in time range)    Or  acetaminophen (TYLENOL) suppository 650 mg (has no administration in time range)  ondansetron (ZOFRAN)  tablet 4 mg (has no administration in time range)    Or  ondansetron (ZOFRAN) injection 4 mg (has no administration in time range)  atorvastatin (LIPITOR) tablet 10 mg (has no administration in time range)  lactated ringers bolus 1,434 mL (0 mLs Intravenous Stopped 01/08/23 1130)  ondansetron (ZOFRAN) injection 4 mg (4 mg Intravenous Given 01/08/23 1026)  morphine (PF) 4 MG/ML injection 4 mg (4 mg Intravenous Given 01/08/23 1057)  pantoprazole (PROTONIX) injection 40 mg (40 mg Intravenous Given 01/08/23 1256)  lactated ringers bolus 2,000 mL (2,000 mLs Intravenous New Bag/Given 01/08/23 1302)  prochlorperazine (COMPAZINE) injection 10 mg (10 mg Intravenous Given 01/08/23 1256)  HYDROmorphone (DILAUDID) injection 1 mg (1 mg Intravenous Given 01/08/23 1324)    ED Course/ Medical Decision Making/ A&P Clinical Course as of 01/08/23 1413  Thu Jan 08, 2023  1019 CBC with Differential (PNL)(!) CBC shows leukocytosis [JK]  1029 Patient's venous blood gas shows a pH of 6.95.  Bicarb is less than 18.  Consistent with diabetic ketoacidosis [JK]  1032 Patient's i-STAT is consistent with DKA.  Initial potassium is 6.  Will order an insulin infusion, hold off on potassium supplementation at this time [JK]  1121 Basic metabolic panel(!) Consistent with DKA [JK]  1132 Reviewed case with Dr Craige Cotta to discuss if pt requires admission by pulm critical care.  Mental status is normal.  Feels pt is appropriate for hospitalist admission [JK]  1148 Case discussed with Dr Robb Matar regarding admission [JK]    Clinical Course User Index [JK] Linwood Dibbles, MD                             Medical Decision Making Problems Addressed: Diabetic ketoacidosis without coma associated with type 1 diabetes mellitus [facility]): acute illness or injury that poses a threat to life or bodily functions  Amount and/or Complexity of Data Reviewed Labs: ordered. Decision-making details documented in ED Course.  Risk Prescription drug  management. Decision regarding hospitalization.   Patient presented to the ED for evaluation of vomiting nausea elevated blood sugar.  Patient has history of diabetes and diabetic ketoacidosis.  Patient had not been using her insulin pump because she ran out of supplies.  Patient's presentation is consistent with DKA.  She has elevated anion gap metabolic acidosis.  Leukocytosis noted but no signs of acute infection at this time.  Suspect her DKA is related to her inadequate insulin.  Patient was given IV fluids as well as an insulin infusion.  Case discussed with critical care and the hospitalist service.  Will admit to the hospital for further treatment and close monitoring.        Final Clinical Impression(s) / ED Diagnoses Final diagnoses:  Diabetic ketoacidosis without coma associated with type 1 diabetes mellitus [facility])    Rx / DC Orders ED Discharge Orders     None         Linwood Dibbles, MD 01/08/23 1414

## 2023-01-09 DIAGNOSIS — Z8349 Family history of other endocrine, nutritional and metabolic diseases: Secondary | ICD-10-CM | POA: Diagnosis not present

## 2023-01-09 DIAGNOSIS — E1110 Type 2 diabetes mellitus with ketoacidosis without coma: Secondary | ICD-10-CM | POA: Diagnosis present

## 2023-01-09 DIAGNOSIS — D75838 Other thrombocytosis: Secondary | ICD-10-CM | POA: Diagnosis not present

## 2023-01-09 DIAGNOSIS — R112 Nausea with vomiting, unspecified: Secondary | ICD-10-CM | POA: Diagnosis not present

## 2023-01-09 DIAGNOSIS — E1069 Type 1 diabetes mellitus with other specified complication: Secondary | ICD-10-CM | POA: Diagnosis not present

## 2023-01-09 DIAGNOSIS — Z808 Family history of malignant neoplasm of other organs or systems: Secondary | ICD-10-CM | POA: Diagnosis not present

## 2023-01-09 DIAGNOSIS — D72829 Elevated white blood cell count, unspecified: Secondary | ICD-10-CM | POA: Diagnosis not present

## 2023-01-09 DIAGNOSIS — E1010 Type 1 diabetes mellitus with ketoacidosis without coma: Secondary | ICD-10-CM | POA: Diagnosis not present

## 2023-01-09 DIAGNOSIS — Z8249 Family history of ischemic heart disease and other diseases of the circulatory system: Secondary | ICD-10-CM | POA: Diagnosis not present

## 2023-01-09 DIAGNOSIS — Z888 Allergy status to other drugs, medicaments and biological substances status: Secondary | ICD-10-CM | POA: Diagnosis not present

## 2023-01-09 DIAGNOSIS — E785 Hyperlipidemia, unspecified: Secondary | ICD-10-CM | POA: Diagnosis not present

## 2023-01-09 DIAGNOSIS — N179 Acute kidney failure, unspecified: Secondary | ICD-10-CM | POA: Diagnosis not present

## 2023-01-09 DIAGNOSIS — E875 Hyperkalemia: Secondary | ICD-10-CM | POA: Diagnosis not present

## 2023-01-09 DIAGNOSIS — E876 Hypokalemia: Secondary | ICD-10-CM | POA: Diagnosis not present

## 2023-01-09 DIAGNOSIS — Z79899 Other long term (current) drug therapy: Secondary | ICD-10-CM | POA: Diagnosis not present

## 2023-01-09 DIAGNOSIS — E871 Hypo-osmolality and hyponatremia: Secondary | ICD-10-CM | POA: Diagnosis not present

## 2023-01-09 DIAGNOSIS — Z833 Family history of diabetes mellitus: Secondary | ICD-10-CM | POA: Diagnosis not present

## 2023-01-09 DIAGNOSIS — Z794 Long term (current) use of insulin: Secondary | ICD-10-CM | POA: Diagnosis not present

## 2023-01-09 LAB — CBC
HCT: 35.7 % — ABNORMAL LOW (ref 36.0–46.0)
Hemoglobin: 11.7 g/dL — ABNORMAL LOW (ref 12.0–15.0)
MCH: 26.6 pg (ref 26.0–34.0)
MCHC: 32.8 g/dL (ref 30.0–36.0)
MCV: 81.1 fL (ref 80.0–100.0)
Platelets: 356 10*3/uL (ref 150–400)
RBC: 4.4 MIL/uL (ref 3.87–5.11)
RDW: 15.3 % (ref 11.5–15.5)
WBC: 10.2 10*3/uL (ref 4.0–10.5)
nRBC: 0 % (ref 0.0–0.2)

## 2023-01-09 LAB — BASIC METABOLIC PANEL
Anion gap: 8 (ref 5–15)
Anion gap: 7 (ref 5–15)
BUN: 12 mg/dL (ref 6–20)
BUN: 12 mg/dL (ref 6–20)
CO2: 15 mmol/L — ABNORMAL LOW (ref 22–32)
CO2: 17 mmol/L — ABNORMAL LOW (ref 22–32)
Calcium: 8.1 mg/dL — ABNORMAL LOW (ref 8.9–10.3)
Calcium: 8.4 mg/dL — ABNORMAL LOW (ref 8.9–10.3)
Chloride: 110 mmol/L (ref 98–111)
Chloride: 113 mmol/L — ABNORMAL HIGH (ref 98–111)
Creatinine, Ser: 0.64 mg/dL (ref 0.44–1.00)
Creatinine, Ser: 0.64 mg/dL (ref 0.44–1.00)
GFR, Estimated: >60 mL/min (ref >60)
GFR, Estimated: >60 mL/min (ref >60)
Glucose, Bld: 148 mg/dL — ABNORMAL HIGH (ref 70–99)
Glucose, Bld: 155 mg/dL — ABNORMAL HIGH (ref 70–99)
Potassium: 3.5 mmol/L (ref 3.5–5.1)
Potassium: 3.4 mmol/L — ABNORMAL LOW (ref 3.5–5.1)
Sodium: 133 mmol/L — ABNORMAL LOW (ref 135–145)
Sodium: 137 mmol/L (ref 135–145)

## 2023-01-09 LAB — COMPREHENSIVE METABOLIC PANEL
ALT: 16 U/L (ref 0–44)
AST: 18 U/L (ref 15–41)
Albumin: 3.2 g/dL — ABNORMAL LOW (ref 3.5–5.0)
Alkaline Phosphatase: 63 U/L (ref 38–126)
Anion gap: 10 (ref 5–15)
BUN: 12 mg/dL (ref 6–20)
CO2: 14 mmol/L — ABNORMAL LOW (ref 22–32)
Calcium: 8.3 mg/dL — ABNORMAL LOW (ref 8.9–10.3)
Chloride: 112 mmol/L — ABNORMAL HIGH (ref 98–111)
Creatinine, Ser: 0.67 mg/dL (ref 0.44–1.00)
GFR, Estimated: >60 mL/min (ref >60)
Glucose, Bld: 151 mg/dL — ABNORMAL HIGH (ref 70–99)
Potassium: 3.5 mmol/L (ref 3.5–5.1)
Sodium: 136 mmol/L (ref 135–145)
Total Bilirubin: 0.8 mg/dL (ref 0.3–1.2)
Total Protein: 6.1 g/dL — ABNORMAL LOW (ref 6.5–8.1)

## 2023-01-09 LAB — GLUCOSE, CAPILLARY
Glucose-Capillary: 122 mg/dL — ABNORMAL HIGH (ref 70–99)
Glucose-Capillary: 124 mg/dL — ABNORMAL HIGH (ref 70–99)
Glucose-Capillary: 133 mg/dL — ABNORMAL HIGH (ref 70–99)
Glucose-Capillary: 133 mg/dL — ABNORMAL HIGH (ref 70–99)
Glucose-Capillary: 136 mg/dL — ABNORMAL HIGH (ref 70–99)
Glucose-Capillary: 144 mg/dL — ABNORMAL HIGH (ref 70–99)
Glucose-Capillary: 146 mg/dL — ABNORMAL HIGH (ref 70–99)
Glucose-Capillary: 153 mg/dL — ABNORMAL HIGH (ref 70–99)
Glucose-Capillary: 157 mg/dL — ABNORMAL HIGH (ref 70–99)
Glucose-Capillary: 157 mg/dL — ABNORMAL HIGH (ref 70–99)
Glucose-Capillary: 163 mg/dL — ABNORMAL HIGH (ref 70–99)
Glucose-Capillary: 169 mg/dL — ABNORMAL HIGH (ref 70–99)
Glucose-Capillary: 176 mg/dL — ABNORMAL HIGH (ref 70–99)
Glucose-Capillary: 179 mg/dL — ABNORMAL HIGH (ref 70–99)
Glucose-Capillary: 219 mg/dL — ABNORMAL HIGH (ref 70–99)

## 2023-01-09 LAB — BETA-HYDROXYBUTYRIC ACID: Beta-Hydroxybutyric Acid: 1.27 mmol/L — ABNORMAL HIGH (ref 0.05–0.27)

## 2023-01-09 MED ORDER — INSULIN GLARGINE-YFGN 100 UNIT/ML ~~LOC~~ SOLN
12.0000 [IU] | Freq: Every day | SUBCUTANEOUS | Status: DC
  Administered 2023-01-09: 12 [IU] via SUBCUTANEOUS
  Filled 2023-01-09 (×2): qty 0.12

## 2023-01-09 MED ORDER — ONDANSETRON HCL 4 MG PO TABS: 4.0000 mg | ORAL_TABLET | Freq: Four times a day (QID) | ORAL | 0 refills | Status: AC | PRN

## 2023-01-09 MED ORDER — SODIUM CHLORIDE 0.9 % IV SOLN: INTRAVENOUS | Status: DC

## 2023-01-09 MED ORDER — INSULIN ASPART 100 UNIT/ML IJ SOLN
0.0000 [IU] | Freq: Three times a day (TID) | INTRAMUSCULAR | Status: DC
  Administered 2023-01-09 (×2): 3 [IU] via SUBCUTANEOUS
  Administered 2023-01-10: 5 [IU] via SUBCUTANEOUS

## 2023-01-09 MED ORDER — INSULIN ASPART 100 UNIT/ML IJ SOLN: 0.0000 [IU] | Freq: Every day | INTRAMUSCULAR | Status: DC

## 2023-01-09 MED ORDER — POTASSIUM CHLORIDE CRYS ER 20 MEQ PO TBCR
40.0000 meq | EXTENDED_RELEASE_TABLET | Freq: Once | ORAL | Status: AC
  Administered 2023-01-09: 40 meq via ORAL
  Filled 2023-01-09: qty 2

## 2023-01-09 MED ORDER — INSULIN ASPART 100 UNIT/ML IJ SOLN
4.0000 [IU] | Freq: Three times a day (TID) | INTRAMUSCULAR | Status: DC
  Administered 2023-01-09 – 2023-01-10 (×3): 4 [IU] via SUBCUTANEOUS

## 2023-01-09 NOTE — Discharge Instructions (Addendum)
Contact Dexcom Assistance Program for assistance with affording diabetic supplies: Website: [email or web address] Phone: [phone]

## 2023-01-09 NOTE — TOC Initial Note (Addendum)
Transition of Care [facility]) - Initial/Assessment Note    Patient Details  Name: [name] MRN: [id number] Date of Birth: 1986-04-02  Transition of Care [facility]) CM/SW Contact:    Lavenia Atlas, RN Phone Number: 01/09/2023, 4:36 PM  Clinical Narrative:    Late documentation: Received TOC consult for medication assistance. This RN CM spoke with patient at bedside who reports she uses a Dexcom G7 at home however continues to have blood glucose overnight lows. Patient reports she has contacted her endocrinologist who referred patient to the [facility] provider due to have them review setting, concern for aggressive settings. Patient reports her DM supplies cost [phone] with insurance, however she saves her money and currently able to afford. Patient is requesting programs to assist with DM supplies. Due to patient having insurance maybe challenging to get assistance. This RNCM encouraged patient to contact her insurance carrier. Per review Dexcom has a patient assistance program, unsure if patient is eligible will attach to AVS.      TOC following for needs.           Expected Discharge Plan: Home/Self Care Barriers to Discharge: Continued Medical Work up   Patient Goals and CMS Choice Patient states their goals for this hospitalization and ongoing recovery are:: return home feeling better CMS Medicare.gov Compare Post Acute Care list provided to:: Patient Choice offered to / list presented to : Patient [place] ownership interest in [facility].provided to:: Patient    Expected Discharge Plan and Services In-house Referral: NA Discharge Planning Services: CM Consult Post Acute Care Choice: NA Living arrangements for the past 2 months: Single Family Home Expected Discharge Date: 01/09/23               DME Arranged: N/A DME Agency: NA       HH Arranged: NA HH Agency: NA        Prior Living Arrangements/Services Living arrangements for the past 2 months: Single Family  Home Lives with:: Relatives Patient language and need for interpreter reviewed:: Yes Do you feel safe going back to the place where you live?: Yes      Need for Family Participation in Patient Care: No (Comment) Care giver support system in place?: Yes (comment) Current home services: Other (comment) (None) Criminal Activity/Legal Involvement Pertinent to Current Situation/Hospitalization: No - Comment as needed  Activities of Daily Living Home Assistive Devices/Equipment: None ADL Screening (condition at time of admission) Patient's cognitive ability adequate to safely complete daily activities?: Yes Is the patient deaf or have difficulty hearing?: No Does the patient have difficulty seeing, even when wearing glasses/contacts?: No Does the patient have difficulty concentrating, remembering, or making decisions?: No Patient able to express need for assistance with ADLs?: Yes Does the patient have difficulty dressing or bathing?: No Independently performs ADLs?: Yes (appropriate for developmental age) Does the patient have difficulty walking or climbing stairs?: No Weakness of Legs: None Weakness of Arms/Hands: None  Permission Sought/Granted Permission sought to share information with : Case Manager Permission granted to share information with : Yes, Verbal Permission Granted  Share Information with NAME: Case Manager           Emotional Assessment Appearance:: Appears stated age Attitude/Demeanor/Rapport: Gracious, Engaged Affect (typically observed): Accepting Orientation: : Oriented to Self, Oriented to Place, Oriented to Situation, Oriented to  Time Alcohol / Substance Use: Not Applicable Psych Involvement: No (comment)  Admission diagnosis:  DKA, type 1 (HCC) [E10.10] Diabetic ketoacidosis without coma associated with type 1  diabetes mellitus (HCC) [E10.10] DKA (diabetic ketoacidosis) (HCC) [E11.10] Patient Active Problem List   Diagnosis Date Noted   DKA (diabetic  ketoacidosis) (HCC) 01/09/2023   DKA, type 1 (HCC) 01/08/2023   Hyperlipidemia 01/08/2023   Lipoma 01/08/2023   Acne 01/08/2023   Thrombocytosis 01/08/2023   AKI (acute kidney injury) (HCC) 01/08/2023   Hirsutism 11/13/2022   Abdominal bloating 11/12/2022   Lipoma of skin 11/12/2022   Mass of left breast 11/12/2022   Excessive hair growth 11/12/2022   Anxiety 07/08/2022   PTSD (post-traumatic stress disorder) 06/25/2022   Encounter for long-term (current) use of medications 06/25/2022   Closed fracture of distal phalanx of great toe 03/10/2022   Hyperlipidemia due to type 1 diabetes mellitus (HCC) 02/11/2022   Diabetic ketoacidosis (HCC) 04/15/2018   Refusal of blood transfusion for reasons of conscience 12/26/2017   Type 1 diabetes mellitus without complication (HCC) 05/19/2017   Colitis    Hyperpigmentation of skin, postinflammatory 08/07/2016   Dizziness 04/25/2016   DKA (diabetic ketoacidoses) 04/25/2016   Rhinitis, allergic 10/11/2015   Diabetes type 1, uncontrolled 07/25/2015   Cephalalgia 07/25/2015   Generalized anxiety disorder 07/25/2015   Depressive disorder 07/25/2015   Diabetic ketoacidosis without coma associated with type 1 diabetes mellitus (HCC) 07/11/2012   Leukocytosis 07/11/2012   Hyperkalemia 07/11/2012   High anion gap metabolic acidosis 07/11/2012   PCP:  Claiborne Rigg, NP Pharmacy:   [id number] - [place], [place] - [address] AT [facility] [address] [place] [address] Phone: [phone] Fax: [phone]  [facility] [address], [place] - [address] [place] [address] Phone: [phone] Fax: [phone]     Social Determinants of Health (SDOH) Social History: SDOH Screenings   Food Insecurity: No Food Insecurity (01/08/2023)  Housing: Low Risk  (01/08/2023)  Transportation Needs: No Transportation Needs (01/08/2023)  Utilities: Not At  Risk (01/08/2023)  Depression (PHQ2-9): High Risk (07/14/2022)  Stress: Stress Concern Present (07/14/2022)  Tobacco Use: Medium Risk (01/08/2023)   SDOH Interventions:     Readmission Risk Interventions     No data to display

## 2023-01-09 NOTE — Progress Notes (Signed)
PROGRESS NOTE    [name]  RUE:[id number] DOB: [birth year] DOA: 01/08/2023 PCP: Claiborne Rigg, NP   Brief Narrative:  37 y.o. female with medical history significant of type 1 diabetes, multiple episodes of DKA presented with abdominal pain, nausea, vomiting.  She was found to be in DKA and was started on IV fluids and insulin drip.   Assessment & Plan:   DKA in a patient with diabetes mellitus type 1 -Presented in DKA and was treated with IV fluids and insulin drip. -Check BMP later this morning and if bicarbonate is improving, will switch to long-acting insulin.  A1c 12.9.  Start carb modified diet.  Diabetes coordinator consult.  AKI -Possibly from above.  Resolved.   Hyperkalemia -Resolved   Hypokalemia -Replace   Leukocytosis -Possibly reactive.  Resolved   Thrombocytosis -Possibly reactive.  Resolved   Hyponatremia -resolved   Hyperlipidemia -Continue statin  DVT prophylaxis: Lovenox Code Status: Full Family Communication: None at bedside Disposition Plan: Status is: Inpatient Remains inpatient appropriate because: Of severity of illness    Consultants: None  Procedures: None  Antimicrobials: None   Subjective: Patient seen and examined at bedside.  He is slightly better but still feels very weak.  Denies any current nausea or vomiting.  No fever or chest pain reported.  Objective: Vitals:   01/09/23 1000 01/09/23 1100 01/09/23 1200 01/09/23 1400  BP: 114/73  104/64 (!) 103/43  Pulse: 95 99 (!) 106 (!) 106  Resp: 12 17 19  (!) 26  Temp:   98.1 F (36.7 C)   TempSrc:   Oral   SpO2: 100% 100% 100% 100%  Weight:      Height:        Intake/Output Summary (Last 24 hours) at 01/09/2023 1530 Last data filed at 01/09/2023 1458 Gross per 24 hour  Intake 4184.64 ml  Output 950 ml  Net 3234.64 ml   Filed Weights   01/08/23 0936 01/08/23 1602  Weight: 71.7 kg 67.1 kg    Examination:  General exam: Appears calm and comfortable.  Looks  chronically ill and deconditioned.  On room air. Respiratory system: Bilateral decreased breath sounds at bases with intermittent tachypnea Cardiovascular system: S1 & S2 heard, intermittently tachycardic  gastrointestinal system: Abdomen is nondistended, soft and nontender. Normal bowel sounds heard. Extremities: No cyanosis, clubbing, edema  Central nervous system: Alert and oriented.  Slow to respond.  No focal neurological deficits. Moving extremities Skin: No rashes, lesions or ulcers Psychiatry: Flat affect.  Not agitated.   Data Reviewed: I have personally reviewed following labs and imaging studies  CBC: Recent Labs  Lab 01/08/23 1003 01/08/23 1134 01/09/23 0527  WBC 16.3*  --  10.2  NEUTROABS 13.7*  --   --   HGB 13.7 15.3* 11.7*  HCT 47.0* 45.0 35.7*  MCV 89.2  --  81.1  PLT 472*  --  356   Basic Metabolic Panel: Recent Labs  Lab 01/08/23 1755 01/08/23 2250 01/09/23 0134 01/09/23 0527 01/09/23 0900  NA 137 135 133* 136 137  K 4.5 4.1 3.5 3.5 3.4*  CL 115* 110 110 112* 113*  CO2 9* 14* 15* 14* 17*  GLUCOSE 190* 173* 148* 151* 155*  BUN 15 12 12 12 12   CREATININE 0.96 0.78 0.64 0.67 0.64  CALCIUM 8.5* 8.7* 8.1* 8.3* 8.4*   GFR: Estimated Creatinine Clearance: 91 mL/min (by C-G formula based on SCr of 0.64 mg/dL). Liver Function Tests: Recent Labs  Lab 01/09/23 0527  AST 18  ALT 16  ALKPHOS 63  BILITOT 0.8  PROT 6.1*  ALBUMIN 3.2*   No results for input(s): "LIPASE", "AMYLASE" in the last 168 hours. No results for input(s): "AMMONIA" in the last 168 hours. Coagulation Profile: No results for input(s): "INR", "PROTIME" in the last 168 hours. Cardiac Enzymes: No results for input(s): "CKTOTAL", "CKMB", "CKMBINDEX", "TROPONINI" in the last 168 hours. BNP (last 3 results) No results for input(s): "PROBNP" in the last 8760 hours. HbA1C: Recent Labs    01/08/23 1003  HGBA1C 12.9*   CBG: Recent Labs  Lab 01/09/23 0832 01/09/23 0941  01/09/23 1029 01/09/23 1136 01/09/23 1242  GLUCAP 124* 153* 176* 219* 163*   Lipid Profile: No results for input(s): "CHOL", "HDL", "LDLCALC", "TRIG", "CHOLHDL", "LDLDIRECT" in the last 72 hours. Thyroid Function Tests: No results for input(s): "TSH", "T4TOTAL", "FREET4", "T3FREE", "THYROIDAB" in the last 72 hours. Anemia Panel: No results for input(s): "VITAMINB12", "FOLATE", "FERRITIN", "TIBC", "IRON", "RETICCTPCT" in the last 72 hours. Sepsis Labs: No results for input(s): "PROCALCITON", "LATICACIDVEN" in the last 168 hours.  Recent Results (from the past 240 hour(s))  MRSA Next Gen by PCR, Nasal     Status: None   Collection Time: 01/08/23  4:02 PM   Specimen: Nasal Mucosa; Nasal Swab  Result Value Ref Range Status   MRSA by PCR Next Gen NOT DETECTED NOT DETECTED Final    Comment: (NOTE) The GeneXpert MRSA Assay (FDA approved for NASAL specimens only), is one component of a comprehensive MRSA colonization surveillance program. It is not intended to diagnose MRSA infection nor to guide or monitor treatment for MRSA infections. Test performance is not FDA approved in patients less than 5 years old. Performed at [facility], [address], [place], [place] [address]          Radiology Studies: No results found.       Glade Lloyd, MD [facility] 01/09/2023, 3:30 PM

## 2023-01-09 NOTE — Inpatient Diabetes Management (Signed)
Inpatient Diabetes Program Recommendations  AACE/ADA: New Consensus Statement on Inpatient Glycemic Control (2015)  Target Ranges:  Prepandial:   less than 140 mg/dL      Peak postprandial:   less than 180 mg/dL (1-2 hours)      Critically ill patients:  140 - 180 mg/dL   Lab Results  Component Value Date   GLUCAP 163 (H) 01/09/2023   HGBA1C 12.9 (H) 01/08/2023    Review of Glycemic Control  Diabetes history: DDM1 Outpatient Diabetes medications: Basaglar 12 at bedtime, Novolog 1:15 TID and 1 units per 50 > 150 Current orders for Inpatient glycemic control: Semglee 12 every day, Novolog 0-15 TID with meals and 0-5 HS  HgbA1C - 12.9%  Inpatient Diabetes Program Recommendations:    Add Novolog 4 units TID with meals if eating > 50%  Spoke with pt at bedside regarding her diabetes control. Pt was surprised HgbA1C had gone up so much. 12.9% with average blood sugar of 324 mg/dL. States she is very stressed with having 3 jobs. To f/u with Dr Shawnee Knapp regarding new pump such as Tandum or OmniPod. Discussed healthy eating, and pt admits to skipping meals. 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 Said she knows what to do, but doesn't have time to manage her diabetes. Pt says she is going to stop some of her part-time jobs so she can prioritize her health. Reviewed hypoglycemia s/s and treatment. Explained how hyperglycemia leads to damage within blood vessels which lead to the common complications seen with uncontrolled diabetes. Stressed to the patient the importance of improving glycemic control to prevent further complications from uncontrolled diabetes.   Will be d/ced on Basaglar and Novolog. Has Dexcom G7 sample to take home. Pt has no other questions.  Discussed above with RN, MD.  Thank you. Ailene Ards, RD, LDN, CDCES Inpatient Diabetes Coordinator [phone]

## 2023-01-09 NOTE — Discharge Summary (Incomplete)
Physician Discharge Summary  [name] ZOX:[id number] DOB: 17-Jul-1986 DOA: 01/08/2023  PCP: Claiborne Rigg, NP  Admit date: 01/08/2023 Discharge date: 01/10/2023  Admitted From: Home Disposition: Home  Recommendations for Outpatient Follow-up:  Follow up with PCP in 1 week with repeat CBC/BMP Outpatient follow-up with endocrinology Follow up in ED if symptoms worsen or new appear   Home Health: No Equipment/Devices: None  Discharge Condition: Stable CODE STATUS: Full Diet recommendation: Carb modified  Brief/Interim Summary: 37 y.o. female with medical history significant of type 1 diabetes, multiple episodes of DKA presented with abdominal pain, nausea, vomiting.  She was found to be in DKA and was started on IV fluids and insulin drip.  Subsequently, her anion gap is closed and she has been switched to long-acting insulin.  She has been put on a carb modified diet.  Her blood sugars are better.  She will be discharged home with close outpatient follow-up with PCP and endocrinology.  Discharge Diagnoses:   DKA in a patient with diabetes mellitus type 1 -Presented in DKA and was treated with IV fluids and insulin drip. -Subsequently, her anion gap is closed and she has been switched to long-acting insulin.  She has been put on a carb modified diet.  Her blood sugars are on the higher side but better.  She will be discharged home with close outpatient follow-up with PCP and endocrinology. -Hemoglobin A1c is 12.9.  Diabetes coordinator evaluation appreciated.  AKI -Possibly from above.  Resolved.  Hyperkalemia -Resolved  Hypokalemia -Resolved  Leukocytosis -Possibly reactive.  Resolved  Thrombocytosis -Possibly reactive.  Resolved  Hyponatremia -resolved  Hyperlipidemia -Continue statin  Discharge Instructions   Allergies as of 01/09/2023       Reactions   Lexapro [escitalopram Oxalate]    NUMBNESS        Medication List     STOP taking these  medications    Fiasp FlexTouch 100 UNIT/ML FlexTouch Pen Generic drug: insulin aspart   fluconazole 150 MG tablet Commonly known as: DIFLUCAN   insulin glargine-yfgn 100 UNIT/ML injection Commonly known as: Semglee (yfgn)       TAKE these medications    acetaminophen 500 MG tablet Commonly known as: TYLENOL Take 1,500 mg by mouth every 6 (six) hours as needed for moderate pain.   atorvastatin 10 MG tablet Commonly known as: LIPITOR Take 1 tablet (10 mg total) by mouth daily.   Basaglar KwikPen 100 UNIT/ML Inject 12 Units into the skin at bedtime.   Dexcom G6 Receiver Devi Use to check blood sugar TID. E10.65   Dexcom G6 Sensor Misc Use to check blood sugar TID. E10.65   Dexcom G6 Transmitter Misc Use to check blood sugar TID. E10.65   hydroquinone 4 % cream Apply 1 application  topically at bedtime as needed (Hyperpigmentation).   hydrOXYzine 50 MG tablet Commonly known as: ATARAX Take 50 mg by mouth 3 (three) times daily as needed for anxiety.   insulin aspart 100 UNIT/ML injection Commonly known as: novoLOG Inject 0-12 Units into the skin 3 (three) times daily with meals. Sliding scale   Insulin Pen Needle 31G X 5 MM Misc USE AS INSTRUCTED. INJECT INTO THE SKIN THREE TIMES DAILY   Insulin Syringes (Disposable) U-100 0.3 ML Misc Use to inject Semglee insulin once daily.   ISOtretinoin 40 MG capsule Commonly known as: ACCUTANE Take 40 mg by mouth 2 (two) times daily.   Junel 1/20 1-20 MG-MCG tablet Generic drug: norethindrone-ethinyl estradiol Take 1 tablet by mouth  daily.   ondansetron 4 MG tablet Commonly known as: ZOFRAN Take 1 tablet (4 mg total) by mouth every 6 (six) hours as needed for nausea.   propranolol 10 MG tablet Commonly known as: INDERAL Take 0.5-1 tablets (5-10 mg total) by mouth 2 (two) times daily as needed. What changed: reasons to take this   tretinoin 0.1 % cream Commonly known as: RETIN-A Apply 1 application  topically at  bedtime as needed (Acne).   True Metrix Meter w/Device Kit Use as directed 4 (four) times daily -  with meals and at bedtime.        Follow-up Information     Claiborne Rigg, NP. Schedule an appointment as soon as possible for a visit in 1 week(s).   Specialty: Nurse Practitioner Contact information: [address] [place] [address] [phone]                Allergies  Allergen Reactions   Lexapro [Escitalopram Oxalate]     NUMBNESS    Consultations: None   Procedures/Studies: No results found.    Subjective: Patient seen and examined at bedside.  Feels better and denies any current nausea or vomiting.  No fever, chest pain or shortness of breath reported.  Discharge Exam: Vitals:   01/09/23 0900 01/09/23 1000  BP:  114/73  Pulse: 99 95  Resp: 17 12  Temp:    SpO2: 100% 100%    General: Pt is alert, awake, not in acute distress.  On room air. Cardiovascular: rate controlled, S1/S2 + Respiratory: bilateral decreased breath sounds at bases Abdominal: Soft, NT, ND, bowel sounds + Extremities: no edema, no cyanosis    The results of significant diagnostics from this hospitalization (including imaging, microbiology, ancillary and laboratory) are listed below for reference.     Microbiology: Recent Results (from the past 240 hour(s))  MRSA Next Gen by PCR, Nasal     Status: None   Collection Time: 01/08/23  4:02 PM   Specimen: Nasal Mucosa; Nasal Swab  Result Value Ref Range Status   MRSA by PCR Next Gen NOT DETECTED NOT DETECTED Final    Comment: (NOTE) The GeneXpert MRSA Assay (FDA approved for NASAL specimens only), is one component of a comprehensive MRSA colonization surveillance program. It is not intended to diagnose MRSA infection nor to guide or monitor treatment for MRSA infections. Test performance is not FDA approved in patients less than 66 years old. Performed at [facility], [address], [place], [place] [address]      Labs: BNP (last 3 results) No results for input(s): "BNP" in the last 8760 hours. Basic Metabolic Panel: Recent Labs  Lab 01/08/23 1755 01/08/23 2250 01/09/23 0134 01/09/23 0527 01/09/23 0900  NA 137 135 133* 136 137  K 4.5 4.1 3.5 3.5 3.4*  CL 115* 110 110 112* 113*  CO2 9* 14* 15* 14* 17*  GLUCOSE 190* 173* 148* 151* 155*  BUN 15 12 12 12 12   CREATININE 0.96 0.78 0.64 0.67 0.64  CALCIUM 8.5* 8.7* 8.1* 8.3* 8.4*   Liver Function Tests: Recent Labs  Lab 01/09/23 0527  AST 18  ALT 16  ALKPHOS 63  BILITOT 0.8  PROT 6.1*  ALBUMIN 3.2*   No results for input(s): "LIPASE", "AMYLASE" in the last 168 hours. No results for input(s): "AMMONIA" in the last 168 hours. CBC: Recent Labs  Lab 01/08/23 1003 01/08/23 1134 01/09/23 0527  WBC 16.3*  --  10.2  NEUTROABS 13.7*  --   --  HGB 13.7 15.3* 11.7*  HCT 47.0* 45.0 35.7*  MCV 89.2  --  81.1  PLT 472*  --  356   Cardiac Enzymes: No results for input(s): "CKTOTAL", "CKMB", "CKMBINDEX", "TROPONINI" in the last 168 hours. BNP: Invalid input(s): "POCBNP" CBG: Recent Labs  Lab 01/09/23 0731 01/09/23 0832 01/09/23 0941 01/09/23 1029 01/09/23 1136  GLUCAP 157* 124* 153* 176* 219*   D-Dimer No results for input(s): "DDIMER" in the last 72 hours. Hgb A1c Recent Labs    01/08/23 1003  HGBA1C 12.9*   Lipid Profile No results for input(s): "CHOL", "HDL", "LDLCALC", "TRIG", "CHOLHDL", "LDLDIRECT" in the last 72 hours. Thyroid function studies No results for input(s): "TSH", "T4TOTAL", "T3FREE", "THYROIDAB" in the last 72 hours.  Invalid input(s): "FREET3" Anemia work up No results for input(s): "VITAMINB12", "FOLATE", "FERRITIN", "TIBC", "IRON", "RETICCTPCT" in the last 72 hours. Urinalysis    Component Value Date/Time   COLORURINE STRAW (A) 01/08/2023 1212   APPEARANCEUR CLEAR 01/08/2023 1212   LABSPEC 1.015 01/08/2023 1212   PHURINE 5.0 01/08/2023 1212   GLUCOSEU >=500  (A) 01/08/2023 1212   HGBUR NEGATIVE 01/08/2023 1212   BILIRUBINUR NEGATIVE 01/08/2023 1212   BILIRUBINUR neg 06/21/2018 0845   KETONESUR 80 (A) 01/08/2023 1212   PROTEINUR 100 (A) 01/08/2023 1212   UROBILINOGEN 0.2 08/15/2019 1351   NITRITE NEGATIVE 01/08/2023 1212   LEUKOCYTESUR NEGATIVE 01/08/2023 1212   Sepsis Labs Recent Labs  Lab 01/08/23 1003 01/09/23 0527  WBC 16.3* 10.2   Microbiology Recent Results (from the past 240 hour(s))  MRSA Next Gen by PCR, Nasal     Status: None   Collection Time: 01/08/23  4:02 PM   Specimen: Nasal Mucosa; Nasal Swab  Result Value Ref Range Status   MRSA by PCR Next Gen NOT DETECTED NOT DETECTED Final    Comment: (NOTE) The GeneXpert MRSA Assay (FDA approved for NASAL specimens only), is one component of a comprehensive MRSA colonization surveillance program. It is not intended to diagnose MRSA infection nor to guide or monitor treatment for MRSA infections. Test performance is not FDA approved in patients less than 44 years old. Performed at [facility], [address], [place], [place] [address]      Time coordinating discharge: 35 minutes  SIGNED:   Glade Lloyd, MD  [facility] 01/10/2023, 9:45 AM

## 2023-01-10 DIAGNOSIS — E1010 Type 1 diabetes mellitus with ketoacidosis without coma: Secondary | ICD-10-CM | POA: Diagnosis not present

## 2023-01-10 LAB — BASIC METABOLIC PANEL
Anion gap: 8 (ref 5–15)
BUN: 12 mg/dL (ref 6–20)
CO2: 19 mmol/L — ABNORMAL LOW (ref 22–32)
Calcium: 7.8 mg/dL — ABNORMAL LOW (ref 8.9–10.3)
Chloride: 108 mmol/L (ref 98–111)
Creatinine, Ser: 0.59 mg/dL (ref 0.44–1.00)
GFR, Estimated: >60 mL/min (ref >60)
Glucose, Bld: 226 mg/dL — ABNORMAL HIGH (ref 70–99)
Potassium: 4.1 mmol/L (ref 3.5–5.1)
Sodium: 135 mmol/L (ref 135–145)

## 2023-01-10 LAB — GLUCOSE, CAPILLARY
Glucose-Capillary: 238 mg/dL — ABNORMAL HIGH (ref 70–99)
Glucose-Capillary: 230 mg/dL — ABNORMAL HIGH (ref 70–99)
Glucose-Capillary: 246 mg/dL — ABNORMAL HIGH (ref 70–99)
Glucose-Capillary: 88 mg/dL (ref 70–99)

## 2023-01-10 MED ORDER — OXYMETAZOLINE HCL 0.05 % NA SOLN
3.0000 | Freq: Two times a day (BID) | NASAL | Status: DC | PRN
  Administered 2023-01-10: 3 via NASAL
  Filled 2023-01-10: qty 15

## 2023-01-10 MED ORDER — INSULIN GLARGINE-YFGN 100 UNIT/ML ~~LOC~~ SOLN
15.0000 [IU] | Freq: Every day | SUBCUTANEOUS | Status: DC
  Administered 2023-01-10: 15 [IU] via SUBCUTANEOUS
  Filled 2023-01-10: qty 0.15

## 2023-01-10 MED ORDER — BASAGLAR KWIKPEN 100 UNIT/ML ~~LOC~~ SOPN: 15.0000 [IU] | PEN_INJECTOR | Freq: Every day | SUBCUTANEOUS | Status: DC

## 2023-01-12 ENCOUNTER — Telehealth: Payer: Self-pay

## 2023-01-12 NOTE — Transitions of Care (Post Inpatient/ED Visit) (Signed)
   01/12/2023  Name: [name] MRN: [id number] DOB: Sep 11, 1985  Today's TOC FU Call Status: Today's TOC FU Call Status:: Unsuccessul Call (1st Attempt) Unsuccessful Call (1st Attempt) Date: 01/12/23  Attempted to reach the patient regarding the most recent Inpatient/ED visit.  Follow Up Plan: Additional outreach attempts will be made to reach the patient to complete the Transitions of Care (Post Inpatient/ED visit) call.      Antionette Fairy, RN,BSN,CCM [facility] Care Management Care Management Community Coordinator Direct Phone: [phone] Toll Free: [phone] Fax: [phone]

## 2023-01-13 ENCOUNTER — Telehealth: Payer: Self-pay

## 2023-01-13 NOTE — Transitions of Care (Post Inpatient/ED Visit) (Signed)
   01/13/2023  Name: [name] MRN: [id number] DOB: Jul 07, 1986  Today's TOC FU Call Status: Today's TOC FU Call Status:: Unsuccessful Call (3rd Attempt) Unsuccessful Call (3rd Attempt) Date: 01/13/23  Attempted to reach the patient regarding the most recent Inpatient/ED visit.  Follow Up Plan: No further outreach attempts will be made at this time. We have been unable to contact the patient.    Antionette Fairy, RN,BSN,CCM [facility] Care Management Care Management Community Coordinator Direct Phone: [phone] Toll Free: [phone] Fax: [phone]

## 2023-01-13 NOTE — Transitions of Care (Post Inpatient/ED Visit) (Signed)
   01/13/2023  Name: [name] MRN: [id number] DOB: 01/09/1986  Today's TOC FU Call Status: Today's TOC FU Call Status:: Unsuccessful Call (2nd Attempt) Unsuccessful Call (2nd Attempt) Date: 01/13/23  Attempted to reach the patient regarding the most recent Inpatient/ED visit.  Follow Up Plan: Additional outreach attempts will be made to reach the patient to complete the Transitions of Care (Post Inpatient/ED visit) call.   Antionette Fairy, RN,BSN,CCM [facility] Care Management Care Management Community Coordinator Direct Phone: [phone] Toll Free: [phone] Fax: [phone]

## 2023-01-19 ENCOUNTER — Other Ambulatory Visit: Payer: Self-pay | Admitting: Family Medicine

## 2023-01-19 DIAGNOSIS — E1065 Type 1 diabetes mellitus with hyperglycemia: Secondary | ICD-10-CM

## 2023-01-20 DIAGNOSIS — E109 Type 1 diabetes mellitus without complications: Secondary | ICD-10-CM | POA: Diagnosis not present

## 2023-02-04 DIAGNOSIS — L853 Xerosis cutis: Secondary | ICD-10-CM | POA: Diagnosis not present

## 2023-02-04 DIAGNOSIS — L700 Acne vulgaris: Secondary | ICD-10-CM | POA: Diagnosis not present

## 2023-02-04 DIAGNOSIS — K130 Diseases of lips: Secondary | ICD-10-CM | POA: Diagnosis not present

## 2023-02-09 DIAGNOSIS — M25552 Pain in left hip: Secondary | ICD-10-CM | POA: Diagnosis not present

## 2023-02-09 DIAGNOSIS — M545 Low back pain, unspecified: Secondary | ICD-10-CM | POA: Diagnosis not present

## 2023-02-12 DIAGNOSIS — M25552 Pain in left hip: Secondary | ICD-10-CM | POA: Diagnosis not present

## 2023-02-12 DIAGNOSIS — M545 Low back pain, unspecified: Secondary | ICD-10-CM | POA: Diagnosis not present

## 2023-02-16 DIAGNOSIS — M9904 Segmental and somatic dysfunction of sacral region: Secondary | ICD-10-CM | POA: Diagnosis not present

## 2023-02-16 DIAGNOSIS — M4726 Other spondylosis with radiculopathy, lumbar region: Secondary | ICD-10-CM | POA: Diagnosis not present

## 2023-02-16 DIAGNOSIS — M4728 Other spondylosis with radiculopathy, sacral and sacrococcygeal region: Secondary | ICD-10-CM | POA: Diagnosis not present

## 2023-02-16 DIAGNOSIS — M9906 Segmental and somatic dysfunction of lower extremity: Secondary | ICD-10-CM | POA: Diagnosis not present

## 2023-02-16 DIAGNOSIS — M9903 Segmental and somatic dysfunction of lumbar region: Secondary | ICD-10-CM | POA: Diagnosis not present

## 2023-02-16 DIAGNOSIS — M4725 Other spondylosis with radiculopathy, thoracolumbar region: Secondary | ICD-10-CM | POA: Diagnosis not present

## 2023-02-16 DIAGNOSIS — M25552 Pain in left hip: Secondary | ICD-10-CM | POA: Diagnosis not present

## 2023-02-16 DIAGNOSIS — M9902 Segmental and somatic dysfunction of thoracic region: Secondary | ICD-10-CM | POA: Diagnosis not present

## 2023-02-17 DIAGNOSIS — M545 Low back pain, unspecified: Secondary | ICD-10-CM | POA: Diagnosis not present

## 2023-02-17 DIAGNOSIS — M25552 Pain in left hip: Secondary | ICD-10-CM | POA: Diagnosis not present

## 2023-02-19 DIAGNOSIS — M25552 Pain in left hip: Secondary | ICD-10-CM | POA: Diagnosis not present

## 2023-02-19 DIAGNOSIS — M545 Low back pain, unspecified: Secondary | ICD-10-CM | POA: Diagnosis not present

## 2023-02-23 DIAGNOSIS — M50322 Other cervical disc degeneration at C5-C6 level: Secondary | ICD-10-CM | POA: Diagnosis not present

## 2023-02-23 DIAGNOSIS — M5137 Other intervertebral disc degeneration, lumbosacral region: Secondary | ICD-10-CM | POA: Diagnosis not present

## 2023-02-23 DIAGNOSIS — M9901 Segmental and somatic dysfunction of cervical region: Secondary | ICD-10-CM | POA: Diagnosis not present

## 2023-02-23 DIAGNOSIS — M9903 Segmental and somatic dysfunction of lumbar region: Secondary | ICD-10-CM | POA: Diagnosis not present

## 2023-02-26 ENCOUNTER — Telehealth: Payer: Self-pay

## 2023-02-26 DIAGNOSIS — M25552 Pain in left hip: Secondary | ICD-10-CM | POA: Diagnosis not present

## 2023-02-26 DIAGNOSIS — M5137 Other intervertebral disc degeneration, lumbosacral region: Secondary | ICD-10-CM | POA: Diagnosis not present

## 2023-02-26 DIAGNOSIS — M545 Low back pain, unspecified: Secondary | ICD-10-CM | POA: Diagnosis not present

## 2023-02-26 DIAGNOSIS — M9901 Segmental and somatic dysfunction of cervical region: Secondary | ICD-10-CM | POA: Diagnosis not present

## 2023-02-26 DIAGNOSIS — M50322 Other cervical disc degeneration at C5-C6 level: Secondary | ICD-10-CM | POA: Diagnosis not present

## 2023-02-26 DIAGNOSIS — M9903 Segmental and somatic dysfunction of lumbar region: Secondary | ICD-10-CM | POA: Diagnosis not present

## 2023-02-26 NOTE — Telephone Encounter (Signed)
LVM for patient to call back [phone], or to call PCP office to schedule follow up apt. AS, CMA  

## 2023-03-02 DIAGNOSIS — M5137 Other intervertebral disc degeneration, lumbosacral region: Secondary | ICD-10-CM | POA: Diagnosis not present

## 2023-03-02 DIAGNOSIS — M9903 Segmental and somatic dysfunction of lumbar region: Secondary | ICD-10-CM | POA: Diagnosis not present

## 2023-03-02 DIAGNOSIS — M50322 Other cervical disc degeneration at C5-C6 level: Secondary | ICD-10-CM | POA: Diagnosis not present

## 2023-03-02 DIAGNOSIS — M9901 Segmental and somatic dysfunction of cervical region: Secondary | ICD-10-CM | POA: Diagnosis not present

## 2023-03-03 DIAGNOSIS — M545 Low back pain, unspecified: Secondary | ICD-10-CM | POA: Diagnosis not present

## 2023-03-03 DIAGNOSIS — M5137 Other intervertebral disc degeneration, lumbosacral region: Secondary | ICD-10-CM | POA: Diagnosis not present

## 2023-03-03 DIAGNOSIS — M50322 Other cervical disc degeneration at C5-C6 level: Secondary | ICD-10-CM | POA: Diagnosis not present

## 2023-03-03 DIAGNOSIS — M9903 Segmental and somatic dysfunction of lumbar region: Secondary | ICD-10-CM | POA: Diagnosis not present

## 2023-03-03 DIAGNOSIS — M9901 Segmental and somatic dysfunction of cervical region: Secondary | ICD-10-CM | POA: Diagnosis not present

## 2023-03-03 DIAGNOSIS — M25552 Pain in left hip: Secondary | ICD-10-CM | POA: Diagnosis not present

## 2023-03-05 ENCOUNTER — Ambulatory Visit: Payer: Self-pay

## 2023-03-05 ENCOUNTER — Telehealth (HOSPITAL_BASED_OUTPATIENT_CLINIC_OR_DEPARTMENT_OTHER): Payer: 59 | Admitting: Physician Assistant

## 2023-03-05 DIAGNOSIS — B3731 Acute candidiasis of vulva and vagina: Secondary | ICD-10-CM | POA: Diagnosis not present

## 2023-03-05 DIAGNOSIS — M50322 Other cervical disc degeneration at C5-C6 level: Secondary | ICD-10-CM | POA: Diagnosis not present

## 2023-03-05 DIAGNOSIS — M5137 Other intervertebral disc degeneration, lumbosacral region: Secondary | ICD-10-CM | POA: Diagnosis not present

## 2023-03-05 DIAGNOSIS — M9901 Segmental and somatic dysfunction of cervical region: Secondary | ICD-10-CM | POA: Diagnosis not present

## 2023-03-05 DIAGNOSIS — E1065 Type 1 diabetes mellitus with hyperglycemia: Secondary | ICD-10-CM

## 2023-03-05 DIAGNOSIS — M9903 Segmental and somatic dysfunction of lumbar region: Secondary | ICD-10-CM | POA: Diagnosis not present

## 2023-03-05 MED ORDER — FLUCONAZOLE 150 MG PO TABS
ORAL_TABLET | ORAL | 1 refills | Status: DC
Start: 2023-03-05 — End: 2023-04-10

## 2023-03-05 NOTE — Telephone Encounter (Signed)
Call to patient- she is at work- asked if we could call back at 2- patient advised if we do not call her- she can call us- ask to speak to nurse

## 2023-03-05 NOTE — Telephone Encounter (Signed)
Answer Assessment - Initial Assessment Questions 1. NAME of MEDICINE: "What medicine(s) are you calling about?"     Diflucan 2. QUESTION: "What is your question?" (e.g., double dose of medicine, side effect)     Patient had recent dental procedure and was given antibiotic- dentist advised her to call PCP when she asked for yeast medication.   4. SYMPTOMS: "Do you have any symptoms?" If Yes, ask: "What symptoms are you having?"  "How bad are the symptoms (e.g., mild, moderate, severe)     Patient states she is very sensitive to antibiotic and needs yeast medication  Protocols used: Medication Question Call-A-AH  Chief Complaint: medication request- unable to fill per protocol- appointment given Symptoms: yeast infection from recent antibiotic use.   Disposition: [] ED /[] Urgent Care (no appt availability in office) / [x] Appointment(In office/virtual)/ []  [facility]/ [] Home Care/ [] Refused Recommended Disposition /[] [place] [facility]/ []  Follow-up with PCP Additional Notes: Appointment has been scheduled to address patient need.

## 2023-03-05 NOTE — Telephone Encounter (Signed)
Reason for Disposition . Prescription request for new medicine (not a refill)  Protocols used: Medication Question Call-A-AH

## 2023-03-05 NOTE — Telephone Encounter (Signed)
Patient called, left VM to return the call to the office to speak to the NT.   Summary: yeast infection?   Pt states that she had a route canal and has been taking Amoxicillin for the past 6 days. Pt states any time she takes an antibiotic she gets really bad yeast infections. Pt asked her dentist to prescribe fluconazole to take with the amoxicillin and the advised her to ask her PCP for that medication. Pt is at work and state to please leave a message if the medication can be called in for her.      [id number] [place], [place] - [address] AT [facility] OF [address] Phone: [phone] Fax: [phone]

## 2023-03-05 NOTE — Progress Notes (Signed)
Patient ID: [name], female   DOB: 05/12/1986, 37 y.o.   MRN: [id number] Virtual Visit via Video Note  I connected with [name] on 03/05/23 at  3:10 PM EDT by a video enabled telemedicine application and verified that I am speaking with the correct person using two identifiers.  Location: Patient: home Provider: [facility] office   I discussed the limitations of evaluation and management by telemedicine and the availability of in person appointments. The patient expressed understanding and agreed to proceed.  History of Present Illness:  patient had recent root canal and now on amoxicillin and gets really bad yeast infections due to type one diabetes.  Current blood sugar 146.  She usu has to take at least 3 diflucan to get rid of yeast infection.  No pelvic pain.  No STD concern.      Observations/Objective:  NAD. A&Ox3   Assessment and Plan: 1. Yeast vaginitis - fluconazole (DIFLUCAN) 150 MG tablet; Take 1 tab today then repeat in 3-4 days until symptoms are gone  Dispense: 4 tablet; Refill: 1  2. Type 1 diabetes mellitus with hyperglycemia (HCC) Making yeast more likely along with antibiotic use.  Continue medications - fluconazole (DIFLUCAN) 150 MG tablet; Take 1 tab today then repeat in 3-4 days until symptoms are gone  Dispense: 4 tablet; Refill: 1    Follow Up Instructions: As next scheduled   I discussed the assessment and treatment plan with the patient. The patient was provided an opportunity to ask questions and all were answered. The patient agreed with the plan and demonstrated an understanding of the instructions.   The patient was advised to call back or seek an in-person evaluation if the symptoms worsen or if the condition fails to improve as anticipated.  I provided 8 minutes of non-face-to-face time during this encounter.   Georgian Co, PA-C

## 2023-03-09 DIAGNOSIS — M9901 Segmental and somatic dysfunction of cervical region: Secondary | ICD-10-CM | POA: Diagnosis not present

## 2023-03-09 DIAGNOSIS — L700 Acne vulgaris: Secondary | ICD-10-CM | POA: Diagnosis not present

## 2023-03-09 DIAGNOSIS — M9903 Segmental and somatic dysfunction of lumbar region: Secondary | ICD-10-CM | POA: Diagnosis not present

## 2023-03-09 DIAGNOSIS — M5137 Other intervertebral disc degeneration, lumbosacral region: Secondary | ICD-10-CM | POA: Diagnosis not present

## 2023-03-09 DIAGNOSIS — M50322 Other cervical disc degeneration at C5-C6 level: Secondary | ICD-10-CM | POA: Diagnosis not present

## 2023-03-09 DIAGNOSIS — L853 Xerosis cutis: Secondary | ICD-10-CM | POA: Diagnosis not present

## 2023-03-09 DIAGNOSIS — K130 Diseases of lips: Secondary | ICD-10-CM | POA: Diagnosis not present

## 2023-03-10 DIAGNOSIS — M9903 Segmental and somatic dysfunction of lumbar region: Secondary | ICD-10-CM | POA: Diagnosis not present

## 2023-03-10 DIAGNOSIS — M5137 Other intervertebral disc degeneration, lumbosacral region: Secondary | ICD-10-CM | POA: Diagnosis not present

## 2023-03-10 DIAGNOSIS — M50322 Other cervical disc degeneration at C5-C6 level: Secondary | ICD-10-CM | POA: Diagnosis not present

## 2023-03-10 DIAGNOSIS — M9901 Segmental and somatic dysfunction of cervical region: Secondary | ICD-10-CM | POA: Diagnosis not present

## 2023-03-12 DIAGNOSIS — M9901 Segmental and somatic dysfunction of cervical region: Secondary | ICD-10-CM | POA: Diagnosis not present

## 2023-03-12 DIAGNOSIS — M50322 Other cervical disc degeneration at C5-C6 level: Secondary | ICD-10-CM | POA: Diagnosis not present

## 2023-03-12 DIAGNOSIS — M9903 Segmental and somatic dysfunction of lumbar region: Secondary | ICD-10-CM | POA: Diagnosis not present

## 2023-03-12 DIAGNOSIS — M5137 Other intervertebral disc degeneration, lumbosacral region: Secondary | ICD-10-CM | POA: Diagnosis not present

## 2023-03-16 DIAGNOSIS — M9903 Segmental and somatic dysfunction of lumbar region: Secondary | ICD-10-CM | POA: Diagnosis not present

## 2023-03-16 DIAGNOSIS — M9901 Segmental and somatic dysfunction of cervical region: Secondary | ICD-10-CM | POA: Diagnosis not present

## 2023-03-16 DIAGNOSIS — M50322 Other cervical disc degeneration at C5-C6 level: Secondary | ICD-10-CM | POA: Diagnosis not present

## 2023-03-16 DIAGNOSIS — M5137 Other intervertebral disc degeneration, lumbosacral region: Secondary | ICD-10-CM | POA: Diagnosis not present

## 2023-03-17 DIAGNOSIS — M9903 Segmental and somatic dysfunction of lumbar region: Secondary | ICD-10-CM | POA: Diagnosis not present

## 2023-03-17 DIAGNOSIS — M5137 Other intervertebral disc degeneration, lumbosacral region: Secondary | ICD-10-CM | POA: Diagnosis not present

## 2023-03-17 DIAGNOSIS — M50322 Other cervical disc degeneration at C5-C6 level: Secondary | ICD-10-CM | POA: Diagnosis not present

## 2023-03-17 DIAGNOSIS — M9901 Segmental and somatic dysfunction of cervical region: Secondary | ICD-10-CM | POA: Diagnosis not present

## 2023-03-19 DIAGNOSIS — M5137 Other intervertebral disc degeneration, lumbosacral region: Secondary | ICD-10-CM | POA: Diagnosis not present

## 2023-03-19 DIAGNOSIS — M50322 Other cervical disc degeneration at C5-C6 level: Secondary | ICD-10-CM | POA: Diagnosis not present

## 2023-03-19 DIAGNOSIS — M9903 Segmental and somatic dysfunction of lumbar region: Secondary | ICD-10-CM | POA: Diagnosis not present

## 2023-03-19 DIAGNOSIS — M9901 Segmental and somatic dysfunction of cervical region: Secondary | ICD-10-CM | POA: Diagnosis not present

## 2023-03-23 DIAGNOSIS — M9903 Segmental and somatic dysfunction of lumbar region: Secondary | ICD-10-CM | POA: Diagnosis not present

## 2023-03-23 DIAGNOSIS — M5137 Other intervertebral disc degeneration, lumbosacral region: Secondary | ICD-10-CM | POA: Diagnosis not present

## 2023-03-23 DIAGNOSIS — M50322 Other cervical disc degeneration at C5-C6 level: Secondary | ICD-10-CM | POA: Diagnosis not present

## 2023-03-23 DIAGNOSIS — M9901 Segmental and somatic dysfunction of cervical region: Secondary | ICD-10-CM | POA: Diagnosis not present

## 2023-03-24 DIAGNOSIS — M9903 Segmental and somatic dysfunction of lumbar region: Secondary | ICD-10-CM | POA: Diagnosis not present

## 2023-03-24 DIAGNOSIS — M9901 Segmental and somatic dysfunction of cervical region: Secondary | ICD-10-CM | POA: Diagnosis not present

## 2023-03-24 DIAGNOSIS — M5137 Other intervertebral disc degeneration, lumbosacral region: Secondary | ICD-10-CM | POA: Diagnosis not present

## 2023-03-24 DIAGNOSIS — M50322 Other cervical disc degeneration at C5-C6 level: Secondary | ICD-10-CM | POA: Diagnosis not present

## 2023-03-26 DIAGNOSIS — M9903 Segmental and somatic dysfunction of lumbar region: Secondary | ICD-10-CM | POA: Diagnosis not present

## 2023-03-26 DIAGNOSIS — M50322 Other cervical disc degeneration at C5-C6 level: Secondary | ICD-10-CM | POA: Diagnosis not present

## 2023-03-26 DIAGNOSIS — M9901 Segmental and somatic dysfunction of cervical region: Secondary | ICD-10-CM | POA: Diagnosis not present

## 2023-03-26 DIAGNOSIS — M5137 Other intervertebral disc degeneration, lumbosacral region: Secondary | ICD-10-CM | POA: Diagnosis not present

## 2023-04-02 DIAGNOSIS — M5137 Other intervertebral disc degeneration, lumbosacral region: Secondary | ICD-10-CM | POA: Diagnosis not present

## 2023-04-02 DIAGNOSIS — M9901 Segmental and somatic dysfunction of cervical region: Secondary | ICD-10-CM | POA: Diagnosis not present

## 2023-04-02 DIAGNOSIS — M50322 Other cervical disc degeneration at C5-C6 level: Secondary | ICD-10-CM | POA: Diagnosis not present

## 2023-04-02 DIAGNOSIS — M9903 Segmental and somatic dysfunction of lumbar region: Secondary | ICD-10-CM | POA: Diagnosis not present

## 2023-04-08 DIAGNOSIS — M5137 Other intervertebral disc degeneration, lumbosacral region: Secondary | ICD-10-CM | POA: Diagnosis not present

## 2023-04-08 DIAGNOSIS — M9901 Segmental and somatic dysfunction of cervical region: Secondary | ICD-10-CM | POA: Diagnosis not present

## 2023-04-08 DIAGNOSIS — M50322 Other cervical disc degeneration at C5-C6 level: Secondary | ICD-10-CM | POA: Diagnosis not present

## 2023-04-08 DIAGNOSIS — M9903 Segmental and somatic dysfunction of lumbar region: Secondary | ICD-10-CM | POA: Diagnosis not present

## 2023-04-09 ENCOUNTER — Other Ambulatory Visit: Payer: Self-pay | Admitting: Nurse Practitioner

## 2023-04-09 DIAGNOSIS — L853 Xerosis cutis: Secondary | ICD-10-CM | POA: Diagnosis not present

## 2023-04-09 DIAGNOSIS — B3731 Acute candidiasis of vulva and vagina: Secondary | ICD-10-CM

## 2023-04-09 DIAGNOSIS — K130 Diseases of lips: Secondary | ICD-10-CM | POA: Diagnosis not present

## 2023-04-09 DIAGNOSIS — L700 Acne vulgaris: Secondary | ICD-10-CM | POA: Diagnosis not present

## 2023-04-09 DIAGNOSIS — E1065 Type 1 diabetes mellitus with hyperglycemia: Secondary | ICD-10-CM

## 2023-04-13 ENCOUNTER — Other Ambulatory Visit: Payer: Self-pay | Admitting: Gastroenterology

## 2023-04-13 DIAGNOSIS — R140 Abdominal distension (gaseous): Secondary | ICD-10-CM

## 2023-04-14 DIAGNOSIS — M9901 Segmental and somatic dysfunction of cervical region: Secondary | ICD-10-CM | POA: Diagnosis not present

## 2023-04-14 DIAGNOSIS — M9903 Segmental and somatic dysfunction of lumbar region: Secondary | ICD-10-CM | POA: Diagnosis not present

## 2023-04-14 DIAGNOSIS — M5137 Other intervertebral disc degeneration, lumbosacral region: Secondary | ICD-10-CM | POA: Diagnosis not present

## 2023-04-14 DIAGNOSIS — M50322 Other cervical disc degeneration at C5-C6 level: Secondary | ICD-10-CM | POA: Diagnosis not present

## 2023-04-15 DIAGNOSIS — S76012A Strain of muscle, fascia and tendon of left hip, initial encounter: Secondary | ICD-10-CM | POA: Diagnosis not present

## 2023-04-16 DIAGNOSIS — R140 Abdominal distension (gaseous): Secondary | ICD-10-CM | POA: Diagnosis not present

## 2023-04-20 DIAGNOSIS — E109 Type 1 diabetes mellitus without complications: Secondary | ICD-10-CM | POA: Diagnosis not present

## 2023-04-21 DIAGNOSIS — M9903 Segmental and somatic dysfunction of lumbar region: Secondary | ICD-10-CM | POA: Diagnosis not present

## 2023-04-21 DIAGNOSIS — M9901 Segmental and somatic dysfunction of cervical region: Secondary | ICD-10-CM | POA: Diagnosis not present

## 2023-04-21 DIAGNOSIS — M5137 Other intervertebral disc degeneration, lumbosacral region: Secondary | ICD-10-CM | POA: Diagnosis not present

## 2023-04-21 DIAGNOSIS — M50322 Other cervical disc degeneration at C5-C6 level: Secondary | ICD-10-CM | POA: Diagnosis not present

## 2023-04-24 ENCOUNTER — Ambulatory Visit
Admission: RE | Admit: 2023-04-24 | Discharge: 2023-04-24 | Disposition: A | Discharge status: Home / Self Care | Payer: 59 | Source: Ambulatory Visit | Attending: Gastroenterology | Admitting: Gastroenterology

## 2023-04-24 DIAGNOSIS — R140 Abdominal distension (gaseous): Secondary | ICD-10-CM | POA: Diagnosis not present

## 2023-07-07 DIAGNOSIS — M25559 Pain in unspecified hip: Secondary | ICD-10-CM | POA: Diagnosis not present

## 2023-07-09 DIAGNOSIS — M47812 Spondylosis without myelopathy or radiculopathy, cervical region: Secondary | ICD-10-CM | POA: Diagnosis not present

## 2023-07-14 ENCOUNTER — Ambulatory Visit: Payer: Self-pay | Admitting: *Deleted

## 2023-07-14 NOTE — Telephone Encounter (Signed)
  Chief Complaint: dry cough, changes in breathing Symptoms: patient feels like her throat is getting tighter-over time and she is not getting the proper amount of air in - SOB with exertion, dizziness Frequency: 2 months Pertinent Negatives: Patient denies chest pain Disposition: [] ED /[] Urgent Care (no appt availability in office) / [] Appointment(In office/virtual)/ []  [facility]/ [] Home Care/ [x] Refused Recommended Disposition /[] [facility]/ []  Follow-up with PCP Additional Notes: Patient advised of 4 hour disposition- UC advised- she prefers mobile unit- will go in am.  Patient advised although this has been happening over time- if she should get worse- needs to be seen urgently.

## 2023-07-14 NOTE — Telephone Encounter (Signed)
Reason for Disposition  [1] Longstanding difficulty breathing (e.g., CHF, COPD, emphysema) AND [2] WORSE than normal  Answer Assessment - Initial Assessment Questions 1. RESPIRATORY STATUS: "Describe your breathing?" (e.g., wheezing, shortness of breath, unable to speak, severe coughing)      Constant dry cough- feels like her throat is closing 2. ONSET: "When did this breathing problem begin?"      2 months 3. PATTERN "Does the difficult breathing come and go, or has it been constant since it started?"      Dry cough-constant, constant difficult 4. SEVERITY: "How bad is your breathing?" (e.g., mild, moderate, severe)    - MILD: No SOB at rest, mild SOB with walking, speaks normally in sentences, can lie down, no retractions, pulse < 100.    - MODERATE: SOB at rest, SOB with minimal exertion and prefers to sit, cannot lie down flat, speaks in phrases, mild retractions, audible wheezing, pulse 100-120.    - SEVERE: Very SOB at rest, speaks in single words, struggling to breathe, sitting hunched forward, retractions, pulse > 120      Feels like throat is tight- more of a struggle, dizziness,mild 5. RECURRENT SYMPTOM: "Have you had difficulty breathing before?" If Yes, ask: "When was the last time?" and "What happened that time?"      no 6. CARDIAC HISTORY: "Do you have any history of heart disease?" (e.g., heart attack, angina, bypass surgery, angioplasty)      no 7. LUNG HISTORY: "Do you have any history of lung disease?"  (e.g., pulmonary embolus, asthma, emphysema)     no 8. CAUSE: "What do you think is causing the breathing problem?"      Unsure- exposure to chemicals at work 9. OTHER SYMPTOMS: "Do you have any other symptoms? (e.g., dizziness, runny nose, cough, chest pain, fever)     Dizziness, cough, nasal congetsion  Protocols used: Breathing Difficulty-A-AH

## 2023-07-15 ENCOUNTER — Ambulatory Visit: Payer: 59 | Attending: Physician Assistant | Admitting: Physician Assistant

## 2023-07-15 ENCOUNTER — Encounter: Payer: Self-pay | Admitting: Physician Assistant

## 2023-07-15 VITALS — BP 113/81 | HR 82 | Wt 168.8 lb

## 2023-07-15 DIAGNOSIS — R0602 Shortness of breath: Secondary | ICD-10-CM

## 2023-07-15 DIAGNOSIS — R079 Chest pain, unspecified: Secondary | ICD-10-CM

## 2023-07-15 DIAGNOSIS — E1065 Type 1 diabetes mellitus with hyperglycemia: Secondary | ICD-10-CM

## 2023-07-15 DIAGNOSIS — R0981 Nasal congestion: Secondary | ICD-10-CM | POA: Diagnosis not present

## 2023-07-15 LAB — GLUCOSE, POCT (MANUAL RESULT ENTRY): POC Glucose: 264 mg/dL — AB (ref 70–99)

## 2023-07-15 MED ORDER — BASAGLAR KWIKPEN 100 UNIT/ML ~~LOC~~ SOPN
15.0000 [IU] | PEN_INJECTOR | Freq: Every day | SUBCUTANEOUS | 3 refills | Status: DC
Start: 2023-07-15 — End: 2023-11-09

## 2023-07-15 MED ORDER — FLUTICASONE PROPIONATE 50 MCG/ACT NA SUSP
2.0000 | Freq: Every day | NASAL | 6 refills | Status: AC
Start: 2023-07-15 — End: ?

## 2023-07-15 MED ORDER — CETIRIZINE HCL 10 MG PO TABS
10.0000 mg | ORAL_TABLET | Freq: Every day | ORAL | 11 refills | Status: AC
Start: 2023-07-15 — End: ?

## 2023-07-15 MED ORDER — ALBUTEROL SULFATE HFA 108 (90 BASE) MCG/ACT IN AERS
2.0000 | INHALATION_SPRAY | Freq: Four times a day (QID) | RESPIRATORY_TRACT | 2 refills | Status: AC | PRN
Start: 2023-07-15 — End: ?

## 2023-07-15 NOTE — Telephone Encounter (Signed)
Went to [facility] today, still having same symptoms.No availability at unit. Appointment found for today. Pt. Agrees with appointment today.

## 2023-07-15 NOTE — Telephone Encounter (Signed)
Patient has OV scheduled for today.

## 2023-07-15 NOTE — Progress Notes (Signed)
Patient ID: DEZEREA QUI, female   DOB: November 11, 1985, 37 y.o.   MRN: [id number]    [name], is a 37 y.o. female  [id number]  [id number]  DOB - 1986-05-29  Chief Complaint  Patient presents with   Cough       Subjective:   [name] is a 37 y.o. female here today for [place] presents today with several concerns.  For about 2-3 months, she has been waking up with sinus congestion and face feels swollen.  At times throughout the day she feels as though her throat is closing and has throat pain but has not seen throat or tongue swelling.  No difficulty swallowing.  She is not taking OTC supplements.  No new or different meds.  Diabetes followed by endocrine.  Not on an ACE or ARB.  Denies relating the symptoms to any foods such as seafood, dairy or shellfish.  No bee stings.  Denies sensation of wheezing but sometimes feels as though she can't breathe.  Teeth cleaned about 2 months ago.    2 week h/o intermittent CP and "feels like I'm not breathing right."  No recent travel.  Not sedentary-she does a lot of walking at work.  Pain can be sudden and sharp.  No radiating pain or paresthesias.  Not worse with leaning forward or back.  Diagnosed with DM1 at age 70.  Denies any FH sudden death or early cardiac issues.  No recent travels.  No calf pain or redness.  No chest pressure.  No dizziness  She does admit to stress and anxiety but says it does not seem too bad right now.  She has been working on letting things go and changing the things she can.    Flowsheet Row Office Visit from 07/15/2023 in [facility] [place] - A Dept Of [place]. [facility] Care Coordination from 07/14/2022 in [facility] Coordination Office Visit from 07/08/2022 in [facility] - A Dept Of [place]. [facility]  Thoughts that you would be better off dead, or of hurting yourself in some way Not at all Several days More than half  the days  PHQ-9 Total Score 10 13 14         No problems updated.  ALLERGIES: Allergies  Allergen Reactions   Lexapro [Escitalopram Oxalate]     NUMBNESS    PAST MEDICAL HISTORY: Past Medical History:  Diagnosis Date   DKA, type 1 (HCC)    Type 1 diabetes mellitus (HCC)     MEDICATIONS AT HOME: Prior to Admission medications   Medication Sig Start Date End Date Taking? Authorizing Provider  acetaminophen (TYLENOL) 500 MG tablet Take 1,500 mg by mouth every 6 (six) hours as needed for moderate pain.   Yes [provider]  albuterol (VENTOLIN HFA) 108 (90 Base) MCG/ACT inhaler Inhale 2 puffs into the lungs every 6 (six) hours as needed for wheezing or shortness of breath. 07/15/23  Yes Calianne Larue M, PA-C  Blood Glucose Monitoring Suppl (TRUE METRIX METER) w/Device KIT Use as directed 4 (four) times daily -  with meals and at bedtime. 03/01/21  Yes Claiborne Rigg, NP  cetirizine (ZYRTEC) 10 MG tablet Take 1 tablet (10 mg total) by mouth daily. 07/15/23  Yes Anders Simmonds, PA-C  Continuous Blood Gluc Receiver (DEXCOM G6 RECEIVER) DEVI Use to check blood sugar TID. E10.65 06/13/22  Yes Hoy Register, MD  Continuous Blood Gluc Sensor (DEXCOM G6  SENSOR) MISC Use to check blood sugar TID. E10.65 06/13/22  Yes Newlin, Enobong, MD  Continuous Blood Gluc Transmit (DEXCOM G6 TRANSMITTER) MISC Use to check blood sugar TID. E10.65 06/13/22  Yes Newlin, Enobong, MD  fluconazole (DIFLUCAN) 150 MG tablet TAKE 1 TABLET BY MOUTH EVERY 3 DAYS. 04/10/23  Yes Claiborne Rigg, NP  fluticasone (FLONASE) 50 MCG/ACT nasal spray Place 2 sprays into both nostrils daily. 07/15/23  Yes Georgian Co M, PA-C  hydroquinone 4 % cream Apply 1 application  topically at bedtime as needed (Hyperpigmentation).   Yes [provider]  hydrOXYzine (ATARAX) 50 MG tablet Take 50 mg by mouth 3 (three) times daily as needed for anxiety.   Yes [provider]  insulin aspart (NOVOLOG)  100 UNIT/ML injection Inject 0-12 Units into the skin 3 (three) times daily with meals. Sliding scale 05/26/22  Yes [provider]  Insulin Syringes, Disposable, U-100 0.3 ML MISC Use to inject Semglee insulin once daily. 08/11/22  Yes Newlin, Odette Horns, MD  ondansetron (ZOFRAN) 4 MG tablet Take 1 tablet (4 mg total) by mouth every 6 (six) hours as needed for nausea. 01/09/23  Yes Glade Lloyd, MD  propranolol (INDERAL) 10 MG tablet Take 0.5-1 tablets (5-10 mg total) by mouth 2 (two) times daily as needed. Patient taking differently: Take 5-10 mg by mouth 2 (two) times daily as needed (Anxiety). 06/25/22  Yes Stasia Cavalier, MD  atorvastatin (LIPITOR) 10 MG tablet Take 1 tablet (10 mg total) by mouth daily. 02/05/22 02/05/23  Anders Simmonds, PA-C  Insulin Glargine (BASAGLAR KWIKPEN) 100 UNIT/ML Inject 15 Units into the skin at bedtime. 07/15/23   Anihya Tuma, Marzella Schlein, PA-C    ROS: Neg GI Neg GU Neg MS Neg psych Neg neuro  Objective:   Vitals:   07/15/23 1529  BP: 113/81  Pulse: 82  SpO2: 98%  Weight: 168 lb 12.8 oz (76.6 kg)   Exam General appearance : Awake, alert, not in any distress. Speech Clear. Not toxic looking HEENT: Atraumatic and Normocephalic, pupils equally reactive to light and accomodation, throat, uvula, and tongue normal and airway patent.  There is no swelling or sign of angioedema AT ALL.  BTM congested and turbinates are boggy and bluish. Neck: Supple, no JVD. No cervical lymphadenopathy.  Chest: Good air entry bilaterally, CTAB.  No rales/rhonchi/wheezing CVS: S1 S2 regular, no murmurs.  Extremities: B/L Lower Ext shows no edema, both legs are warm to touch.  No calf erythema or swelling b Neurology: Awake alert, and oriented X 3, CN II-XII intact, Non focal Skin: No Rash  Data Review Lab Results  Component Value Date   HGBA1C 12.9 (H) 01/08/2023   HGBA1C 9.9 (H) 05/20/2022   HGBA1C 9.9 (A) 02/05/2022    Assessment & Plan   1. Type 1 diabetes  mellitus with hyperglycemia (HCC) (Primary) Followed by endocrine-needed RF on basaglar - POCT glucose (manual entry) - Insulin Glargine (BASAGLAR KWIKPEN) 100 UNIT/ML; Inject 15 Units into the skin at bedtime.  Dispense: 3 mL; Refill: 3  2. Chest pain, unspecified type CP warnings-call 911.  No acute ST changes and EKG compared to previous 12/2022.  EKG also reviewed with Dr Alvis Lemmings.  ? Possible stress component as well - Ambulatory referral to Cardiology due to risk factors  3. SOB (shortness of breath) Uncertain if allergic component but no new meds and has not been able to relate to foods - Ambulatory referral to Allergy - cetirizine (ZYRTEC) 10 MG tablet; Take 1 tablet (  10 mg total) by mouth daily.  Dispense: 30 tablet; Refill: 11 - fluticasone (FLONASE) 50 MCG/ACT nasal spray; Place 2 sprays into both nostrils daily.  Dispense: 16 g; Refill: 6 - albuterol (VENTOLIN HFA) 108 (90 Base) MCG/ACT inhaler; Inhale 2 puffs into the lungs every 6 (six) hours as needed for wheezing or shortness of breath.  Dispense: 18 g; Refill: 2 - Ambulatory referral to Cardiology  4. Sinus congestion - Ambulatory referral to Allergy - cetirizine (ZYRTEC) 10 MG tablet; Take 1 tablet (10 mg total) by mouth daily.  Dispense: 30 tablet; Refill: 11 - fluticasone (FLONASE) 50 MCG/ACT nasal spray; Place 2 sprays into both nostrils daily.  Dispense: 16 g; Refill: 6    Return in about 3 months (around 10/13/2023) for PCP for chronic conditions.  The patient was given clear instructions to go to ER or return to medical center if symptoms don't improve, worsen or new problems develop. The patient verbalized understanding. The patient was told to call to get lab results if they haven't heard anything in the next week.      Georgian Co, PA-C [facility] [place], [place] [phone]   07/15/2023, 4:31 PM

## 2023-07-15 NOTE — Patient Instructions (Signed)
Chest Wall Pain Chest wall pain is pain in or around the bones and muscles of your chest. Chest wall pain may be caused by: An injury. Coughing a lot. Using your chest and arm muscles too much. Sometimes, the cause may not be known. This pain may take a few [place] or longer to get better. Follow these instructions at home: Managing pain, stiffness, and swelling If told, put ice on the painful area: Put ice in a plastic bag. Place a towel between your skin and the bag. Leave the ice on for 20 minutes, 2-3 times a day.  Activity Rest as told by your doctor. Avoid doing things that cause pain. This includes lifting heavy items. Ask your doctor what activities are safe for you. General instructions  Take over-the-counter and prescription medicines only as told by your doctor. Do not use any products that contain nicotine or tobacco, such as cigarettes, e-cigarettes, and chewing tobacco. If you need help quitting, ask your doctor. Keep all follow-up visits as told by your doctor. This is important. Contact a doctor if: You have a fever. Your chest pain gets worse. You have new symptoms. Get help right away if: You feel sick to your stomach (nauseous) or you throw up (vomit). You feel sweaty or light-headed. You have a cough with mucus from your lungs (sputum) or you cough up blood. You are short of breath. These symptoms may be an emergency. Do not wait to see if the symptoms will go away. Get medical help right away. Call your local emergency services (911 in the [place] Do not drive yourself to the hospital. Summary Chest wall pain is pain in or around the bones and muscles of your chest. It may be treated with ice, rest, and medicines. Your condition may also get better if you avoid doing things that cause pain. Contact a doctor if you have a fever, chest pain that gets worse, or new symptoms. Get help right away if you feel light-headed or you get short of breath. These symptoms may  be an emergency. This information is not intended to replace advice given to you by your health care provider. Make sure you discuss any questions you have with your health care provider. Document Revised: 07/07/2022 Document Reviewed: 07/07/2022 Elsevier Patient Education  2024 [employer].

## 2023-07-16 DIAGNOSIS — E103219 Type 1 diabetes mellitus with mild nonproliferative diabetic retinopathy with macular edema, unspecified eye: Secondary | ICD-10-CM | POA: Insufficient documentation

## 2023-07-16 NOTE — Progress Notes (Addendum)
Cardiology Office Note   Date:  07/17/2023   ID:  [name], DOB [birth year], MRN [id number]  PCP:  Claiborne Rigg, NP  Cardiologist:   Rollene Rotunda, MD Referring:  Claiborne Rigg, NP   Chief Complaint  Patient presents with   Coronary Artery Disease      History of Present Illness: [name] is a 37 y.o. female who presents for follow up of SOB.  She presents with this and some chest tightness.  This has been going on for about 2 months.  She has chest tightness walking upstairs.  She was a Systems analyst just a few months ago and she was not having this problem but now she is starting to notice this..  Tightness walking up the stairs.  She has kind of a dull discomfort constantly but it becomes a little more intense with some throat discomfort if she is doing something physically active.  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  She said the discomfort peaks at 4 out of 10.  She might get lightheaded.  She is not describing jaw discomfort.  She is not having arm discomfort.  She thinks her breathing is out of proportion to what she is doing but she is not describing resting shortness of breath, PND or orthopnea.  She is not describing palpitations, presyncope.  She wonders if she could have that access to mold bothering her.  She has not had any prior cardiac workup or pulmonary evaluation.  She has had diabetes mellitus and she was 37 years old and it has been poorly controlled.  For the last couple of years she apparently has been working with an endocrinologist.  She was last in the hospital in June.  She has had multiple episodes of DKA.  She did have AKI but this resolved.   Past Medical History:  Diagnosis Date   DKA, type 1 (HCC)     History reviewed. No pertinent surgical history.   Current Outpatient Medications  Medication Sig Dispense Refill   acetaminophen (TYLENOL) 500 MG tablet Take 1,500 mg by mouth every 6 (six) hours  as needed for moderate pain.     albuterol (VENTOLIN HFA) 108 (90 Base) MCG/ACT inhaler Inhale 2 puffs into the lungs every 6 (six) hours as needed for wheezing or shortness of breath. 18 g 2   Blood Glucose Monitoring Suppl (TRUE METRIX METER) w/Device KIT Use as directed 4 (four) times daily -  with meals and at bedtime. 1 kit 0   cetirizine (ZYRTEC) 10 MG tablet Take 1 tablet (10 mg total) by mouth daily. 30 tablet 11   Continuous Blood Gluc Receiver (DEXCOM G6 RECEIVER) DEVI Use to check blood sugar TID. E10.65 1 each 0   Continuous Blood Gluc Sensor (DEXCOM G6 SENSOR) MISC Use to check blood sugar TID. E10.65 3 each 2   Continuous Blood Gluc Transmit (DEXCOM G6 TRANSMITTER) MISC Use to check blood sugar TID. E10.65 1 each 1   fluconazole (DIFLUCAN) 150 MG tablet TAKE 1 TABLET BY MOUTH EVERY 3 DAYS. 3 tablet 2   fluticasone (FLONASE) 50 MCG/ACT nasal spray Place 2 sprays into both nostrils daily. 16 g 6   hydroquinone 4 % cream Apply 1 application  topically at bedtime as needed (Hyperpigmentation).     hydrOXYzine (ATARAX) 50 MG tablet Take 50 mg by mouth 3 (three) times daily as needed for anxiety.     insulin aspart (NOVOLOG)  100 UNIT/ML injection Inject 0-12 Units into the skin 3 (three) times daily with meals. Sliding scale     Insulin Glargine (BASAGLAR KWIKPEN) 100 UNIT/ML Inject 15 Units into the skin at bedtime. 3 mL 3   Insulin Syringes, Disposable, U-100 0.3 ML MISC Use to inject Semglee insulin once daily. 100 each 0   metoprolol tartrate (LOPRESSOR) 100 MG tablet Take 1 tablet (100 mg total) by mouth once for 1 dose. 2 hours before your CTA test. 1 tablet 0   ondansetron (ZOFRAN) 4 MG tablet Take 1 tablet (4 mg total) by mouth every 6 (six) hours as needed for nausea. 20 tablet 0   propranolol (INDERAL) 10 MG tablet Take 0.5-1 tablets (5-10 mg total) by mouth 2 (two) times daily as needed. (Patient taking differently: Take 5-10 mg by mouth 2 (two) times daily as needed (Anxiety).) 60  tablet 2   atorvastatin (LIPITOR) 10 MG tablet Take 1 tablet (10 mg total) by mouth daily. 90 tablet 1   No current facility-administered medications for this visit.    Allergies:   Lexapro [escitalopram oxalate]    Social History:  The patient  reports that she has never smoked. She has been exposed to tobacco smoke. She has never used smokeless tobacco. She reports current alcohol use. She reports that she does not use drugs.   Family History:  The patient's family history includes Diabetes in her maternal aunt; Hypertension in her father, mother, and sister; Sleep apnea in her father; Throat cancer in her mother; Thyroid disease in her mother.    ROS:  Please see the history of present illness.   Otherwise, review of systems are positive for none.   All other systems are reviewed and negative.    PHYSICAL EXAM: VS:  BP 124/66   Pulse 87   Ht 5\' 6"  (1.676 m)   Wt 169 lb (76.7 kg)   SpO2 99%   BMI 27.28 kg/m  , BMI Body mass index is 27.28 kg/m. GENERAL:  Well appearing HEENT:  Pupils equal round and reactive, fundi not visualized, oral mucosa unremarkable NECK:  No jugular venous distention, waveform within normal limits, carotid upstroke brisk and symmetric, no bruits, no thyromegaly LYMPHATICS:  No cervical, inguinal adenopathy LUNGS:  Clear to auscultation bilaterally BACK:  No CVA tenderness CHEST:  Unremarkable HEART:  PMI not displaced or sustained,S1 and S2 within normal limits, no S3, no S4, no clicks, no rubs, no murmurs ABD:  Flat, positive bowel sounds normal in frequency in pitch, no bruits, no rebound, no guarding, no midline pulsatile mass, no hepatomegaly, no splenomegaly EXT:  2 plus pulses throughout, no edema, no cyanosis no clubbing SKIN:  No rashes no nodules NEURO:  Cranial nerves II through XII grossly intact, motor grossly intact throughout PSYCH:  Cognitively intact, oriented to person place and time    EKG:    Normal sinus rhythm, rate 80, leftward  axis, poor anterior R wave progression, no acute ST-T wave changes.  The    Recent Labs: 01/09/2023: ALT 16; Hemoglobin 11.7; Platelets 356 01/10/2023: BUN 12; Creatinine, Ser 0.59; Potassium 4.1; Sodium 135    Lipid Panel    Component Value Date/Time   CHOL 185 05/20/2022 1402   TRIG 63 05/20/2022 1402   HDL 88 05/20/2022 1402   CHOLHDL 2.1 05/20/2022 1402   CHOLHDL 2.5 12/23/2009 2225   VLDL 22 12/23/2009 2225   LDLCALC 85 05/20/2022 1402      Wt Readings from Last 3 Encounters:  07/17/23 169 lb (76.7 kg)  07/15/23 168 lb 12.8 oz (76.6 kg)  01/08/23 147 lb 14.9 oz (67.1 kg)      Other studies Reviewed: Additional studies/ records that were reviewed today include: Hospital records Review of the above records demonstrates:  Please see elsewhere in the note.     ASSESSMENT AND PLAN:  Type 1 diabetes mellitus with hyperglycemia (HCC) (Primary): We had a long discussion about carbohydrates.   No change in therapy.  She will need a beta HCG along with other routine testing prior to this study.   Chest pain, unspecified type: She does have significant risk factors.   The pretest probability of obstructive coronary disease is at least moderately high.  Coronary CTA is indicated.  No change in therapy.   SOB (shortness of breath): This will be evaluated as above.  I will also get a BNP level.  If the CT and BNP are normal then no further cardiac workup and I would suggest possibly pulmonary workup.   Current medicines are reviewed at length with the patient today.  The patient does not have concerns regarding medicines.  The following changes have been made:  no change  Labs/ tests ordered today include:   Orders Placed This Encounter  Procedures   CT CORONARY MORPH W/CTA COR W/SCORE W/CA W/CM &/OR WO/CM   Basic metabolic panel   Brain natriuretic peptide   B-HCG Quant   hCG, serum, qualitative   EKG 12-Lead     Disposition:   FU with with me as needed.       Signed, Rollene Rotunda, MD  07/17/2023 4:56 PM    [facility]

## 2023-07-17 ENCOUNTER — Encounter: Payer: Self-pay | Admitting: Cardiology

## 2023-07-17 ENCOUNTER — Ambulatory Visit: Payer: 59 | Attending: Cardiology | Admitting: Cardiology

## 2023-07-17 VITALS — BP 124/66 | HR 87 | Ht 66.0 in | Wt 169.0 lb

## 2023-07-17 DIAGNOSIS — R072 Precordial pain: Secondary | ICD-10-CM

## 2023-07-17 DIAGNOSIS — E103219 Type 1 diabetes mellitus with mild nonproliferative diabetic retinopathy with macular edema, unspecified eye: Secondary | ICD-10-CM

## 2023-07-17 DIAGNOSIS — R0602 Shortness of breath: Secondary | ICD-10-CM | POA: Diagnosis not present

## 2023-07-17 MED ORDER — METOPROLOL TARTRATE 100 MG PO TABS: 100.0000 mg | ORAL_TABLET | Freq: Once | ORAL | 0 refills | Status: AC

## 2023-07-17 NOTE — Addendum Note (Signed)
Addended by: Jeannette How A on: 07/17/2023 04:56 PM   Modules accepted: Orders

## 2023-07-17 NOTE — Patient Instructions (Signed)
Medication Instructions:  Metoprolol tartrate 100 mg by mouth 2 hours prior to CTA test. *If you need a refill on your cardiac medications before your next appointment, please call your pharmacy*   Lab Work: BMET, BNP, HCG today. If you have labs (blood work) drawn today and your tests are completely normal, you will receive your results only by: MyChart Message (if you have MyChart) OR A paper copy in the mail If you have any lab test that is abnormal or we need to change your treatment, we will call you to review the results.   Testing/Procedures:   Your cardiac CT will be scheduled at one of the below locations:   [facility] [address] [place], [place] [address] [phone]   If scheduled at [facility], please arrive at the [facility] and Children's Entrance (Entrance C2) of [facility] 30 minutes prior to test start time. You can use the FREE valet parking offered at entrance C (encouraged to control the heart rate for the test)  Proceed to the [facility] Radiology Department (first floor) to check-in and test prep.  All radiology patients and guests should use entrance C2 at [facility], accessed from [facility], even though the hospital's physical address listed is [address]     Please follow these instructions carefully (unless otherwise directed):  An IV will be required for this test and Nitroglycerin will be given.   On the Night Before the Test:  Be sure to Drink plenty of water. Do not consume any caffeinated/decaffeinated beverages or chocolate 12 hours prior to your test. Do not take any antihistamines 12 hours prior to your test. Drink plenty of water until 1 hour prior to the test. Do not eat any food 1 hour prior to test. You may take your regular medications prior to the test.  Take metoprolol (Lopressor) two hours prior to test. Patients who wear a continuous glucose monitor MUST  remove the device prior to scanning. FEMALES- please wear underwire-free bra if available, avoid dresses & tight clothing  After the Test: Drink plenty of water. After receiving IV contrast, you may experience a mild flushed feeling. This is normal. On occasion, you may experience a mild rash up to 24 hours after the test. This is not dangerous. If this occurs, you can take Benadryl 25 mg and increase your fluid intake. If you experience trouble breathing, this can be serious. If it is severe call 911 IMMEDIATELY. If it is mild, please call our office.  We will call to schedule your test 2-4 weeks out understanding that some insurance companies will need an authorization prior to the service being performed.   For more information and frequently asked questions, please visit our website : [email or web address]  For non-scheduling related questions, please contact the cardiac imaging nurse navigator should you have any questions/concerns: Cardiac Imaging Nurse Navigators Direct Office Dial: [phone]   For scheduling needs, including cancellations and rescheduling, please call [place], [phone].    Follow-Up: At [facility], you and your health needs are our priority.  As part of our continuing mission to provide you with exceptional heart care, we have created designated Provider Care Teams.  These Care Teams include your primary Cardiologist (physician) and Advanced Practice Providers (APPs -  Physician Assistants and Nurse Practitioners) who all work together to provide you with the care you need, when you need it.  We recommend signing up for the patient portal called "  MyChart".  Sign up information is provided on this After Visit Summary.  MyChart is used to connect with patients for Virtual Visits (Telemedicine).  Patients are able to view lab/test results, encounter notes, upcoming appointments, etc.  Non-urgent messages can be sent to your provider as  well.   To learn more about what you can do with MyChart, go to [email or web address].    Your next appointment:    As needed.   Provider:   Rollene Rotunda, MD

## 2023-07-18 LAB — BASIC METABOLIC PANEL
BUN/Creatinine Ratio: 14 (ref 9–23)
BUN: 11 mg/dL (ref 6–20)
CO2: 23 mmol/L (ref 20–29)
Calcium: 9 mg/dL (ref 8.7–10.2)
Chloride: 98 mmol/L (ref 96–106)
Creatinine, Ser: 0.79 mg/dL (ref 0.57–1.00)
Glucose: 315 mg/dL — ABNORMAL HIGH (ref 70–99)
Potassium: 4.6 mmol/L (ref 3.5–5.2)
Sodium: 135 mmol/L (ref 134–144)
eGFR: 99 mL/min/{1.73_m2} (ref >59)

## 2023-07-18 LAB — HCG, SERUM, QUALITATIVE: hCG,Beta Subunit,Qual,Serum: NEGATIVE m[IU]/mL (ref <6)

## 2023-08-05 ENCOUNTER — Ambulatory Visit (HOSPITAL_COMMUNITY): Payer: BLUE CROSS/BLUE SHIELD

## 2023-08-17 ENCOUNTER — Ambulatory Visit: Payer: 59 | Admitting: Allergy

## 2023-08-19 ENCOUNTER — Ambulatory Visit: Payer: 59 | Admitting: Internal Medicine

## 2023-08-28 ENCOUNTER — Ambulatory Visit: Payer: Self-pay

## 2023-08-28 ENCOUNTER — Other Ambulatory Visit: Payer: Self-pay | Admitting: Family Medicine

## 2023-08-28 DIAGNOSIS — M549 Dorsalgia, unspecified: Secondary | ICD-10-CM

## 2023-09-01 NOTE — Progress Notes (Deleted)
 New Patient Note  RE: [name] MRN: [id number] DOB: 04/17/1986 Date of Office Visit: 09/02/2023  Consult requested by: Danton Jon HERO, PA-C Primary care provider: Theotis Haze ORN, NP  Chief Complaint: No chief complaint on file.  History of Present Illness: I had the pleasure of seeing [name] for initial evaluation at the [facility] on 09/01/2023. She is a 38 y.o. female, who is referred here by Theotis Haze ORN, NP for the evaluation of shortness of breath, sinus congestion.  Discussed the use of AI scribe software for clinical note transcription with the patient, who gave verbal consent to proceed.  History of Present Illness             08/28/2023 pulm visit: She has no prior history of asthma and has never required an inhaler until now. She reports experiencing facial swelling, particularly around her eyes, which she attributes to aging. She also notes occasional difficulty in breathing through her nose, even while on Zyrtec , which she finds beneficial. She describes a sensation of inadequate oxygen intake, despite nasal breathing, and has been experiencing this for several months. She does not report any childhood history of asthma. She reports no swelling of the tongue but mentions occasional difficulty in throat breathing. She is uncertain if these symptoms are related to her dental health. She has been experiencing these symptoms for approximately 5 months, with an increase in intensity over the past month. She was previously employed as a systems analyst and engaged in regular physical activity, but her last hospital visit disrupted this routine. She is unsure if her current symptoms are due to a sudden cessation of extreme physical activity. She reports chest tightness but no wheezing. She attempts to breathe through her mouth to increase oxygen intake. She suspects dust as a potential trigger for her symptoms and uses an air filter designed  for small to medium-sized rooms, which provides some relief but does not completely alleviate her symptoms. She is uncertain if mold exposure could be contributing to her symptoms. She has been residing in her current home for approximately 10 years and recently had her pipes replaced due to lead contamination. 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 She has recently started wearing a mask with filters, which has slightly improved her symptoms. She has not yet used albuterol . She has been diagnosed with acid reflux in the past and was prescribed omeprazole , which she discontinued after her symptoms resolved. She has been living with diabetes since the age of 64 and requires dental work, including partial implants and crowns. She reports severe abdominal swelling and has undergone an ultrasound to rule out organ blockage. She has also had a colonoscopy in 2017 or 2018. She is unsure if she has been diagnosed with gastroparesis. She reports no history of smoking. Her symptoms are exacerbated by walking and running.  She has a history of angioedema, characterized by occasional facial swelling. She is scheduled for an allergy test to determine if her symptoms are triggered by an allergen.   Impression: She describes shortness of breath and chest tightness which could be consistent with asthma however the differential diagnosis includes idiopathic laryngeal obstruction in the setting of life stress and gastroesophageal reflux disease. I like for her to try GERD treatment to see if this helps with the symptom. I have encouraged her to use albuterol  (she has not used it up until this point) and we need  to get full pulmonary function testing with pre and postbronchodilator evaluation.  Plan; Intermittent shortness of breath: Differential diagnosis includes idiopathic laryngeal obstruction versus asthma Lung function test Use albuterol  2 puffs every 4-6 hours as needed for  shortness of breath  Gastroesophageal reflux disease: Take over-the-counter omeprazole  20 mg daily for 2 to 4 weeks and see if this helps with the episodes of shortness of breath  Facial and throat swelling: Allergy referral has been made  Assessment and Plan: [name] is a 38 y.o. female with: ***  Assessment and Plan               No follow-ups on file.  No orders of the defined types were placed in this encounter.  Lab Orders  No laboratory test(s) ordered today    Other allergy screening: Asthma: {Blank single:19197::yes,no} Rhino conjunctivitis: {Blank single:19197::yes,no} Food allergy: {Blank single:19197::yes,no} Medication allergy: {Blank single:19197::yes,no} Hymenoptera allergy: {Blank single:19197::yes,no} Urticaria: {Blank single:19197::yes,no} Eczema:{Blank single:19197::yes,no} History of recurrent infections suggestive of immunodeficency: {Blank single:19197::yes,no}  Diagnostics: Spirometry:  Tracings reviewed. Her effort: {Blank single:19197::Good reproducible efforts.,It was hard to get consistent efforts and there is a question as to whether this reflects a maximal maneuver.,Poor effort, data can not be interpreted.} FVC: ***L FEV1: ***L, ***% predicted FEV1/FVC ratio: ***% Interpretation: {Blank single:19197::Spirometry consistent with mild obstructive disease,Spirometry consistent with moderate obstructive disease,Spirometry consistent with severe obstructive disease,Spirometry consistent with possible restrictive disease,Spirometry consistent with mixed obstructive and restrictive disease,Spirometry uninterpretable due to technique,Spirometry consistent with normal pattern,No overt abnormalities noted given today's efforts}.  Please see scanned spirometry results for details.  Skin Testing: {Blank single:19197::Select foods,Environmental allergy panel,Environmental allergy panel and select  foods,Food allergy panel,None,Deferred due to recent antihistamines use}. *** Results discussed with patient/family.   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) 07/16/2023   DKA (diabetic ketoacidosis) (HCC) 01/09/2023   DKA, type 1 (HCC) 01/08/2023   Hyperlipidemia 01/08/2023   Lipoma 01/08/2023   Acne 01/08/2023   Thrombocytosis 01/08/2023   AKI (acute kidney injury) (HCC) 01/08/2023   Hirsutism 11/13/2022   Abdominal bloating 11/12/2022   Lipoma of skin 11/12/2022   Mass of left breast 11/12/2022   Excessive hair growth 11/12/2022   Anxiety 07/08/2022   PTSD (post-traumatic stress disorder) 06/25/2022   Encounter for long-term (current) use of medications 06/25/2022   Closed fracture of distal phalanx of great toe 03/10/2022   Hyperlipidemia due to type 1 diabetes mellitus (HCC) 02/11/2022   Diabetic ketoacidosis (HCC) 04/15/2018   Refusal of blood transfusion for reasons of conscience 12/26/2017   Type 1 diabetes mellitus without complication (HCC) 05/19/2017   Colitis    Hyperpigmentation of skin, postinflammatory 08/07/2016   Dizziness 04/25/2016   DKA (diabetic ketoacidosis) (HCC) 04/25/2016   Rhinitis, allergic 10/11/2015   Diabetes type 1, uncontrolled 07/25/2015   Cephalalgia 07/25/2015   Generalized anxiety disorder 07/25/2015   Depressive disorder 07/25/2015   Diabetic ketoacidosis without coma associated with type 1 diabetes mellitus (HCC) 07/11/2012   Leukocytosis 07/11/2012   Hyperkalemia 07/11/2012   High anion gap metabolic acidosis 07/11/2012   Past Medical History:  Diagnosis Date   DKA, type 1 (HCC)    Past Surgical History: No past surgical history on file. Medication List:  Current Outpatient Medications  Medication Sig Dispense Refill   acetaminophen  (TYLENOL ) 500 MG tablet Take 1,500 mg by mouth every 6 (six) hours  as needed for moderate pain.  albuterol  (VENTOLIN  HFA) 108 (90 Base) MCG/ACT inhaler Inhale 2 puffs into the lungs every 6 (six) hours as needed for wheezing or shortness of breath. 18 g 2   atorvastatin  (LIPITOR) 10 MG tablet Take 1 tablet (10 mg total) by mouth daily. 90 tablet 1   Blood Glucose Monitoring Suppl (TRUE METRIX METER) w/Device KIT Use as directed 4 (four) times daily -  with meals and at bedtime. 1 kit 0   cetirizine  (ZYRTEC ) 10 MG tablet Take 1 tablet (10 mg total) by mouth daily. 30 tablet 11   Continuous Blood Gluc Receiver (DEXCOM G6 RECEIVER) DEVI Use to check blood sugar TID. E10.65 1 each 0   Continuous Blood Gluc Sensor (DEXCOM G6 SENSOR) MISC Use to check blood sugar TID. E10.65 3 each 2   Continuous Blood Gluc Transmit (DEXCOM G6 TRANSMITTER) MISC Use to check blood sugar TID. E10.65 1 each 1   fluconazole  (DIFLUCAN ) 150 MG tablet TAKE 1 TABLET BY MOUTH EVERY 3 DAYS. 3 tablet 2   fluticasone  (FLONASE ) 50 MCG/ACT nasal spray Place 2 sprays into both nostrils daily. 16 g 6   hydroquinone  4 % cream Apply 1 application  topically at bedtime as needed (Hyperpigmentation).     hydrOXYzine  (ATARAX ) 50 MG tablet Take 50 mg by mouth 3 (three) times daily as needed for anxiety.     insulin  aspart (NOVOLOG ) 100 UNIT/ML injection Inject 0-12 Units into the skin 3 (three) times daily with meals. Sliding scale     Insulin  Glargine (BASAGLAR  KWIKPEN) 100 UNIT/ML Inject 15 Units into the skin at bedtime. 3 mL 3   Insulin  Syringes, Disposable, U-100 0.3 ML MISC Use to inject Semglee  insulin  once daily. 100 each 0   metoprolol  tartrate (LOPRESSOR ) 100 MG tablet Take 1 tablet (100 mg total) by mouth once for 1 dose. 2 hours before your CTA test. 1 tablet 0   ondansetron  (ZOFRAN ) 4 MG tablet Take 1 tablet (4 mg total) by mouth every 6 (six) hours as needed for nausea. 20 tablet 0   propranolol  (INDERAL ) 10 MG tablet Take 0.5-1 tablets (5-10 mg total) by mouth 2 (two) times daily as needed.  (Patient taking differently: Take 5-10 mg by mouth 2 (two) times daily as needed (Anxiety).) 60 tablet 2   No current facility-administered medications for this visit.   Allergies: Allergies  Allergen Reactions   Lexapro  [Escitalopram  Oxalate]     NUMBNESS   Social History: Social History   Socioeconomic History   Marital status: Single    Spouse name: Not on file   Number of children: 0   Years of education: Not on file   Highest education level: Bachelor's degree (e.g., BA, AB, BS)  Occupational History   Not on file  Tobacco Use   Smoking status: Never    Passive exposure: Yes   Smokeless tobacco: Never  Vaping Use   Vaping status: Never Used  Substance and Sexual Activity   Alcohol use: Yes    Comment: occasionally    Drug use: No   Sexual activity: Not Currently  Other Topics Concern   Not on file  Social History Narrative   Caffeine  coffee  cup 3 x week.  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   Lives with grandma.    Social Drivers of Corporate Investment Banker Strain: Not on file  Food Insecurity: Low Risk  (08/28/2023)   Received from [facility]   Hunger Vital Sign    Worried  About Running Out of Food in the Last Year: Never true    Ran Out of Food in the Last Year: Never true  Transportation Needs: No Transportation Needs (08/28/2023)   Received from [facility]    In the past 12 months, has lack of reliable transportation kept you from medical appointments, meetings, work or from getting things needed for daily living? : No  Physical Activity: Not on file  Stress: Stress Concern Present (07/14/2022)   [employer] of Occupational Health - Occupational Stress Questionnaire    Feeling of Stress : To some extent  Social Connections: Unknown (12/16/2021)   Received from [facility]   Social Network    Social Network: Not on file   Lives in a ***. Smoking: *** Occupation:  ***  Environmental History: Water  Damage/mildew in the house: {Blank single:19197::yes,no} Carpet in the family room: {Blank single:19197::yes,no} Carpet in the bedroom: {Blank single:19197::yes,no} Heating: {Blank single:19197::electric,gas,heat pump} Cooling: {Blank single:19197::central,window,heat pump} Pet: {Blank single:19197::yes ***,no}  Family History: Family History  Problem Relation Age of Onset   Hypertension Mother    Throat cancer Mother    Thyroid disease Mother    Hypertension Father    Sleep apnea Father    Hypertension Sister    Diabetes Maternal Aunt    Problem                               Relation Asthma                                   *** Eczema                                *** Food allergy                          *** Allergic rhino conjunctivitis     ***  Review of Systems  Constitutional:  Negative for appetite change, chills, fever and unexpected weight change.  HENT:  Negative for congestion and rhinorrhea.   Eyes:  Negative for itching.  Respiratory:  Negative for cough, chest tightness, shortness of breath and wheezing.   Cardiovascular:  Negative for chest pain.  Gastrointestinal:  Negative for abdominal pain.  Genitourinary:  Negative for difficulty urinating.  Skin:  Negative for rash.  Neurological:  Negative for headaches.    Objective: LMP 08/07/2023 (Approximate)  There is no height or weight on file to calculate BMI. Physical Exam Vitals and nursing note reviewed.  Constitutional:      Appearance: Normal appearance. She is well-developed.  HENT:     Head: Normocephalic and atraumatic.     Right Ear: Tympanic membrane and external ear normal.     Left Ear: Tympanic membrane and external ear normal.     Nose: Nose normal.     Mouth/Throat:     Mouth: Mucous membranes are moist.     Pharynx: Oropharynx is clear.  Eyes:     Conjunctiva/sclera: Conjunctivae normal.  Cardiovascular:     Rate and  Rhythm: Normal rate and regular rhythm.     Heart sounds: Normal heart sounds. No murmur heard.    No friction rub. No gallop.  Pulmonary:     Effort: Pulmonary effort is normal.  Breath sounds: Normal breath sounds. No wheezing, rhonchi or rales.  Musculoskeletal:     Cervical back: Neck supple.  Skin:    General: Skin is warm.     Findings: No rash.  Neurological:     Mental Status: She is alert and oriented to person, place, and time.  Psychiatric:        Behavior: Behavior normal.    The plan was reviewed with the patient/family, and all questions/concerned were addressed.  It was my pleasure to see [name] today and participate in her care. Please feel free to contact me with any questions or concerns.  Sincerely,  Orlan Cramp, DO Allergy & Immunology  [facility]  [place] office: [phone] [facility] office: [phone]

## 2023-09-02 ENCOUNTER — Ambulatory Visit: Payer: 59 | Admitting: Allergy

## 2023-09-14 ENCOUNTER — Encounter: Payer: Self-pay | Admitting: Physician Assistant

## 2023-09-17 IMAGING — US US BREAST*L* LIMITED INC AXILLA
1 series · 10 of 10 positions shown · non-contrast
Comparison: Previous exam(s).

CLINICAL DATA: Patient describes an enlarging lump within the LEFT
axilla/midline axillary line. This mass was previously evaluated by
ultrasound on 06/21/2018.

Patient describes a new intermittent focal pain within the outer
LEFT breast.
EXAM:
DIGITAL DIAGNOSTIC BILATERAL MAMMOGRAM WITH TOMOSYNTHESIS AND CAD;
ULTRASOUND LEFT BREAST LIMITED
TECHNIQUE: Bilateral digital diagnostic mammography and breast tomosynthesis
was performed. The images were evaluated with computer-aided
detection.; Targeted ultrasound examination of the left breast was
performed.

[Series 1: us breast*left* limited inc axilla · 0.06mm/px · 10 of 10 slices shown]
[im 1/10]
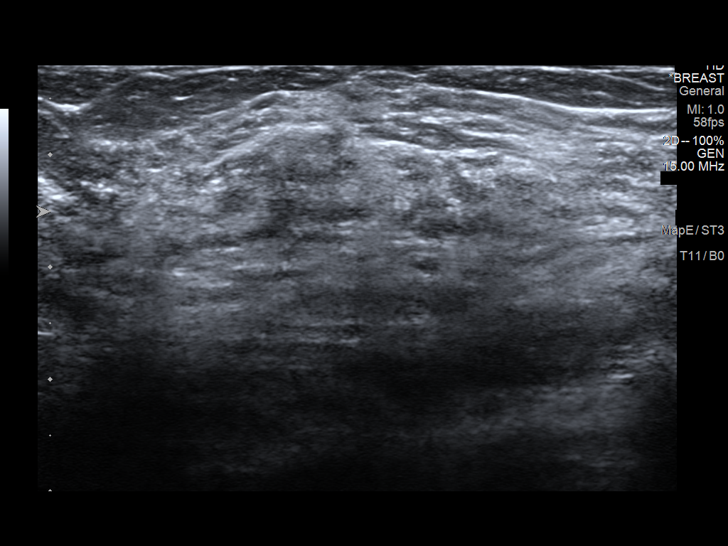
[im 2/10]
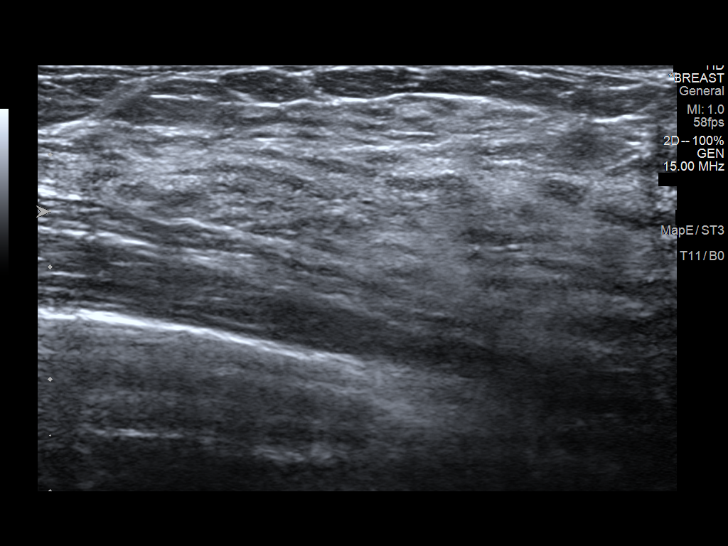
[im 3/10]
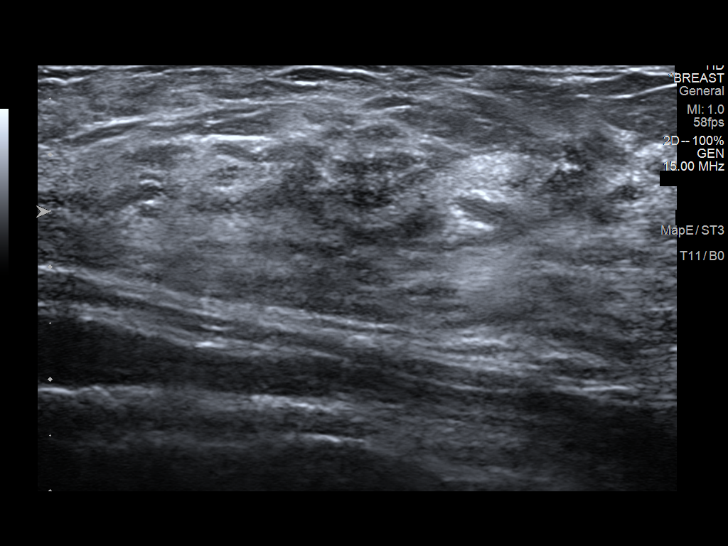
[im 4/10]
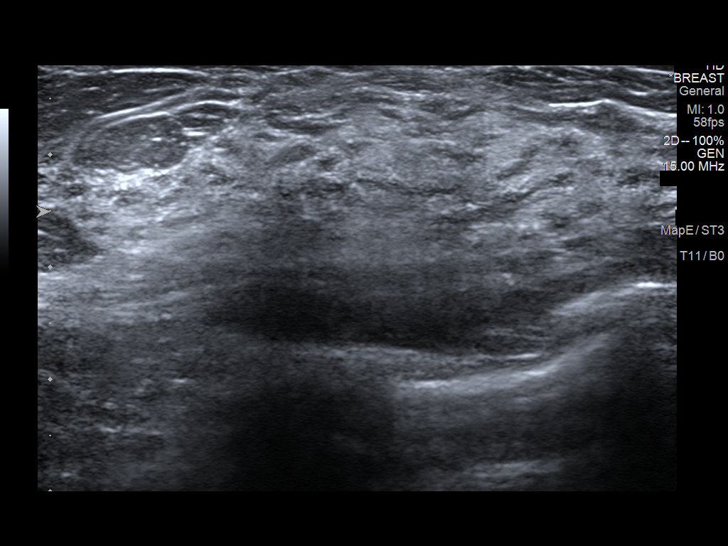
[im 5/10]
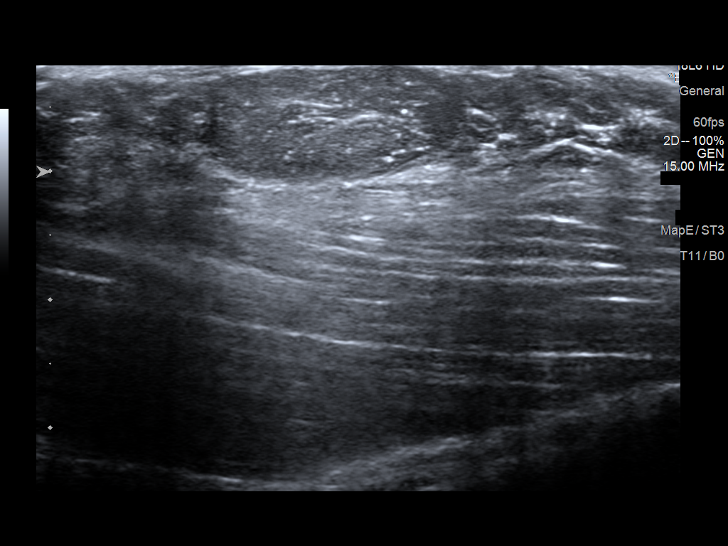
[im 6/10]
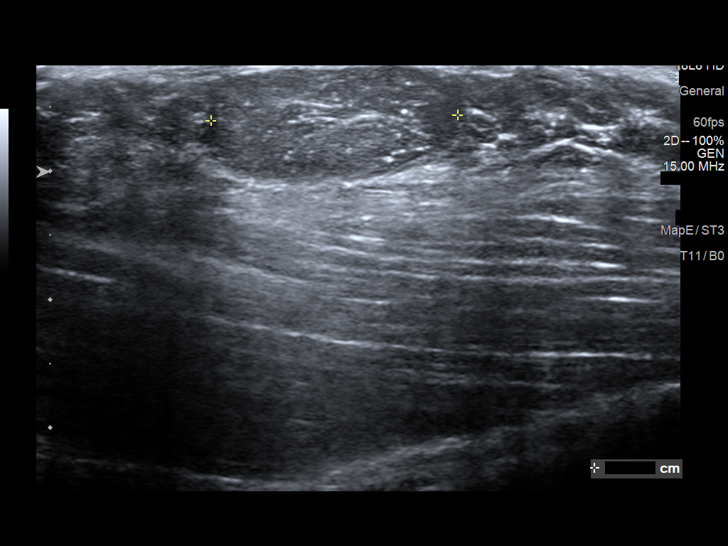
[im 7/10]
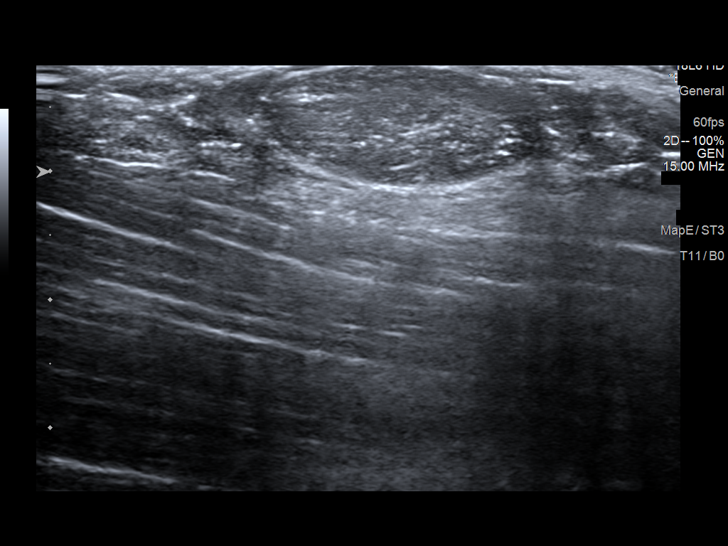
[im 8/10]
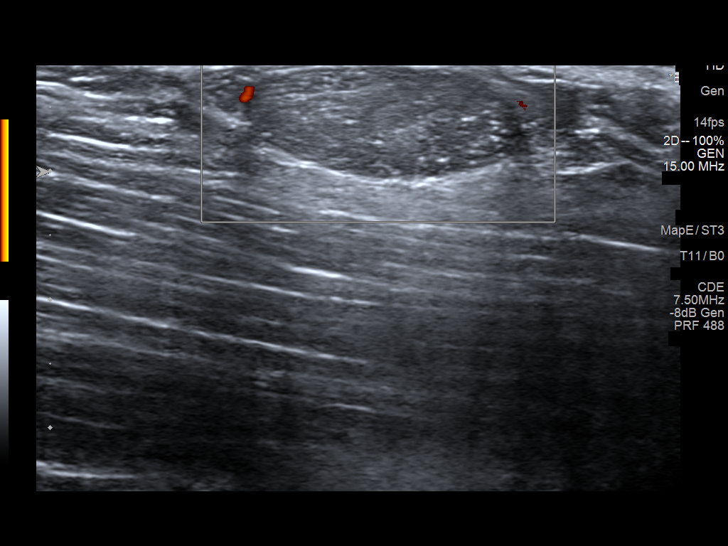
[im 9/10]
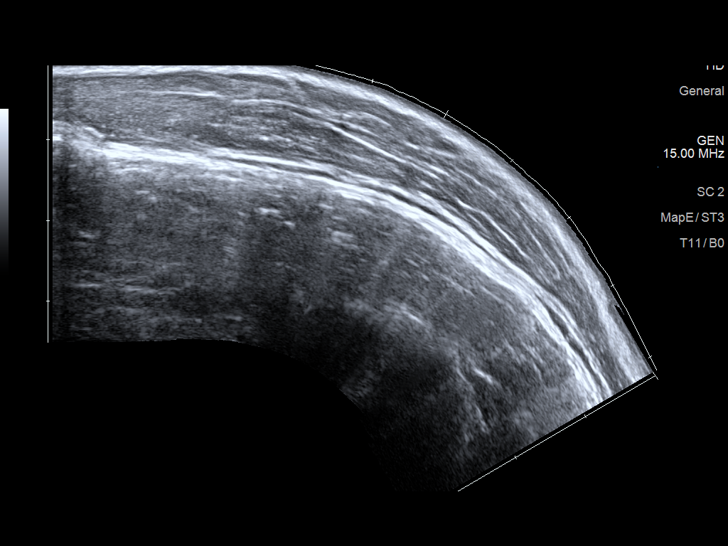
[im 10/10]
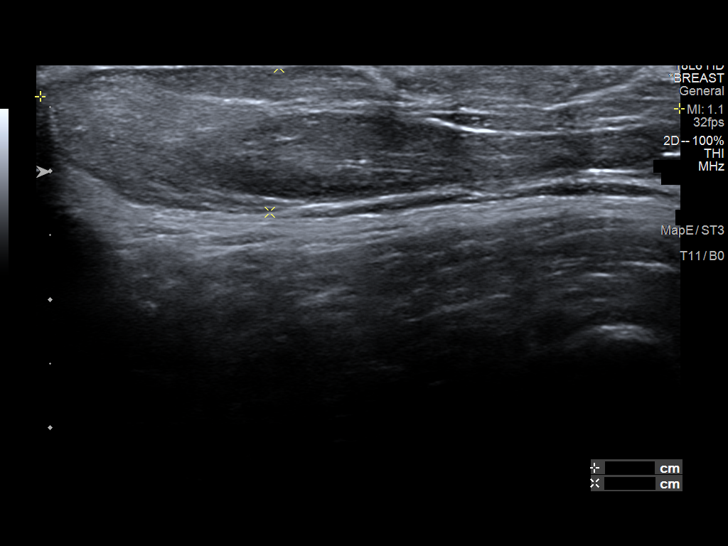

[10 of 10 positions shown; findings below may reference images not displayed]

ACR Breast Density Category d: The breast tissue is extremely dense,
which lowers the sensitivity of mammography.
FINDINGS: There are no new dominant masses, suspicious calcifications or
secondary signs of malignancy within either breast. Specifically,
there is no mammographic abnormality within the outer LEFT breast
corresponding to the area of concern to the patient.

Targeted ultrasound is performed, evaluating the upper-outer
quadrant of the LEFT breast with particular attention to the 2
o'clock axis as directed by the patient, showing only normal
fibroglandular tissues and fat lobules throughout. No solid or
cystic mass. There is a ridge of normal dense fibroglandular tissue
at the 2 o'clock axis, corresponding to the area of clinical
concern.

Redemonstrated is a benign lipoma within the LEFT axilla/mid
axillary line, increased in thickness with a demonstrated
measurement of 1.1 cm thickness (previously 7 mm). The long axis of
the lipoma is difficult to measure due to its configuration, but
measures at least 5 cm greatest dimension.
IMPRESSION: 1. No evidence of malignancy within either breast.
2. No evidence of malignancy or acute findings within the
upper-outer quadrant of the LEFT breast, corresponding to the area
of clinical concern/pain.
3. Benign lipoma within the LEFT axilla/mid axillary line, measuring
at least 5 cm greatest thickness and enlarged in thickness compared
to previous ultrasound of 06/21/2018, corresponding to patient's
palpable area of concern.

RECOMMENDATION:
1. Screening mammogram at age 41 unless there are persistent or
intervening clinical concerns. (Code:ZS-N-FY9)
2. Breast pain is a common condition, which will often resolve on
its own without intervention. Benign causes of breast pain, and
possible remedies, were discussed with the patient. Patient was
encouraged to follow-up with referring physician if the pain
persisted or worsened. Patient was instructed to return for
additional imaging if a new palpable lump developed in either
breast.
3. Patient indicated a desire to have the lipoma in the LEFT
axilla/mid axillary line removed. Consider surgical consultation.

I have discussed the findings and recommendations with the patient.
If applicable, a reminder letter will be sent to the patient
regarding the next appointment.

BI-RADS CATEGORY  2: Benign.

## 2023-09-17 IMAGING — MG DIGITAL DIAGNOSTIC BILAT W/ TOMO W/ CAD
8 of 16 series · 8 of 40 positions shown · non-contrast
Comparison: Previous exam(s).

CLINICAL DATA: Patient describes an enlarging lump within the LEFT
axilla/midline axillary line. This mass was previously evaluated by
ultrasound on 06/21/2018.

Patient describes a new intermittent focal pain within the outer
LEFT breast.
EXAM:
DIGITAL DIAGNOSTIC BILATERAL MAMMOGRAM WITH TOMOSYNTHESIS AND CAD;
ULTRASOUND LEFT BREAST LIMITED
TECHNIQUE: Bilateral digital diagnostic mammography and breast tomosynthesis
was performed. The images were evaluated with computer-aided
detection.; Targeted ultrasound examination of the left breast was
performed.

[L CC synth-2D]
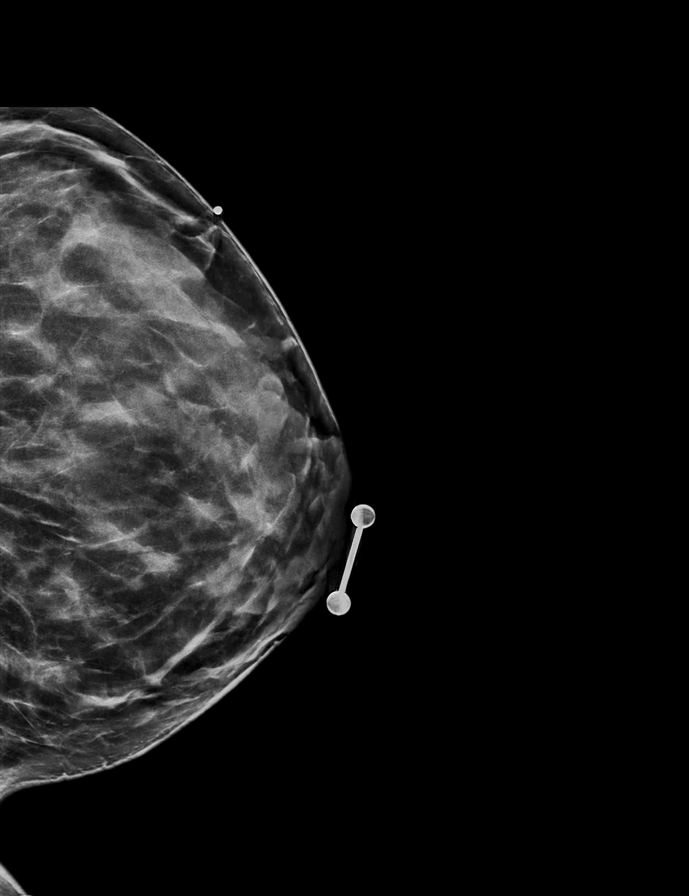

[L TAN synth-2D (1 of 3)]
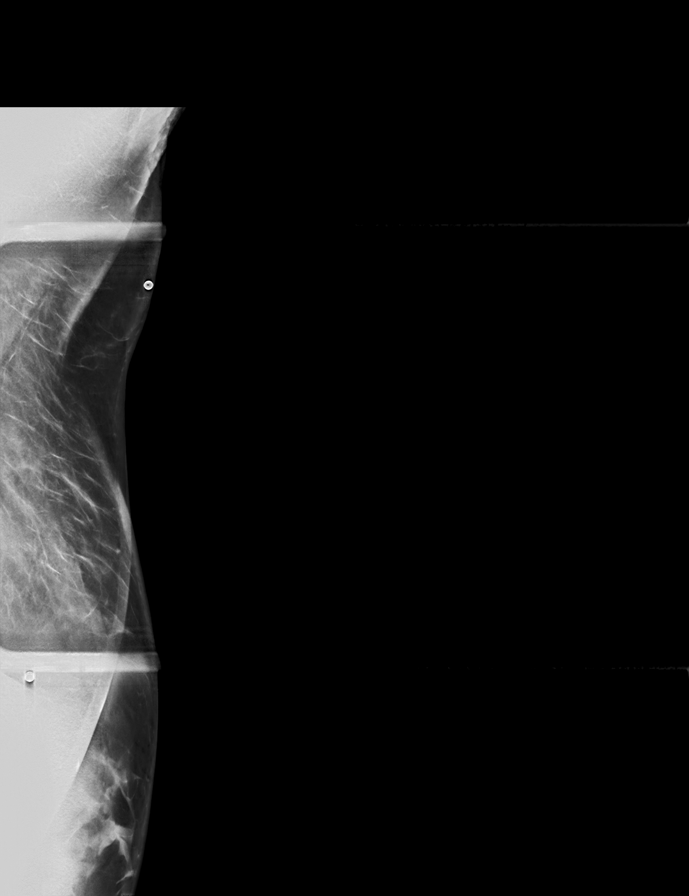

[R MLO synth-2D (1 of 2)]
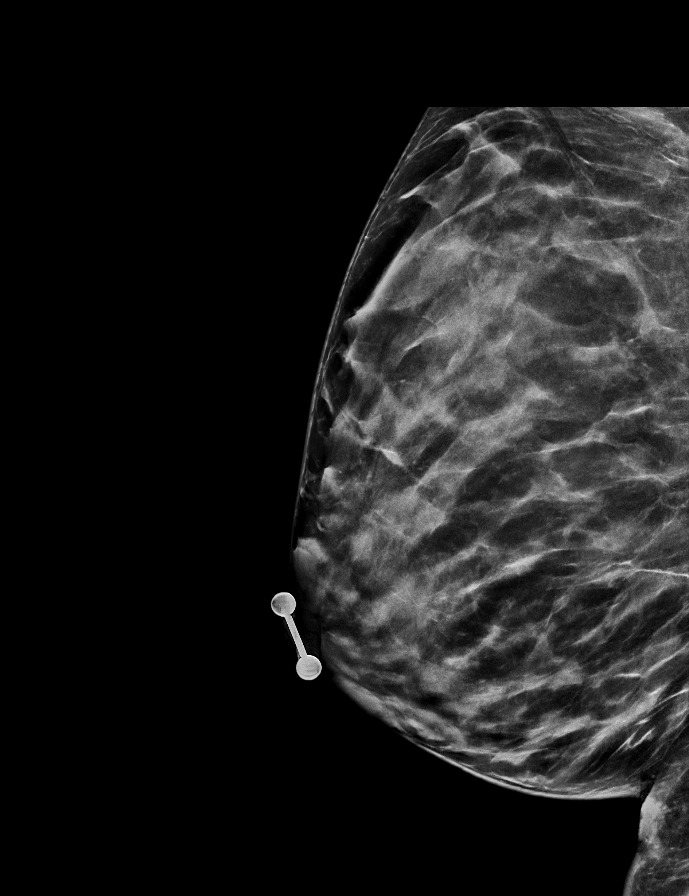

[L TAN synth-2D (2 of 3)]
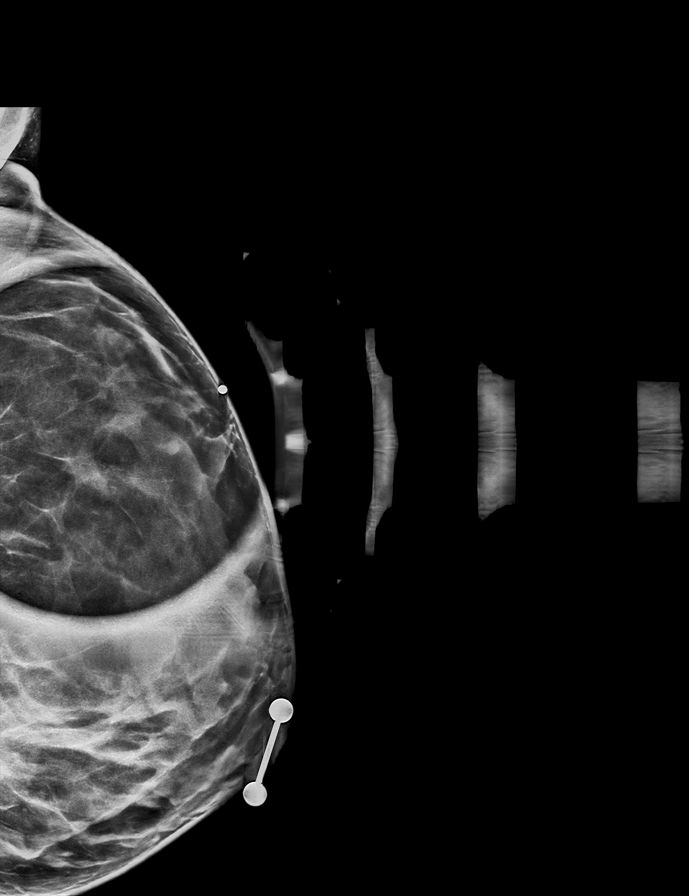

[L TAN synth-2D (3 of 3)]
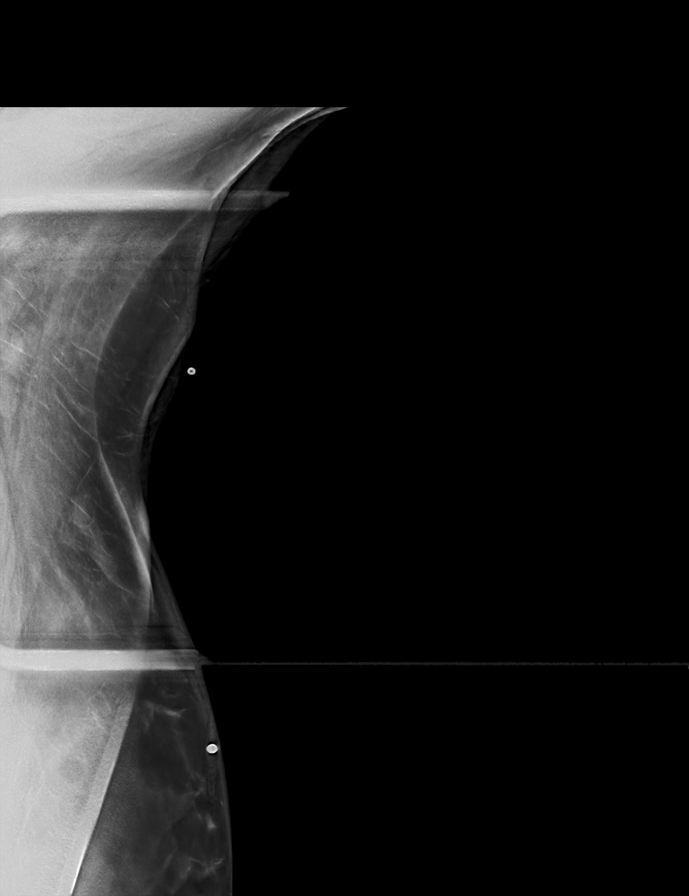

[R CC synth-2D]
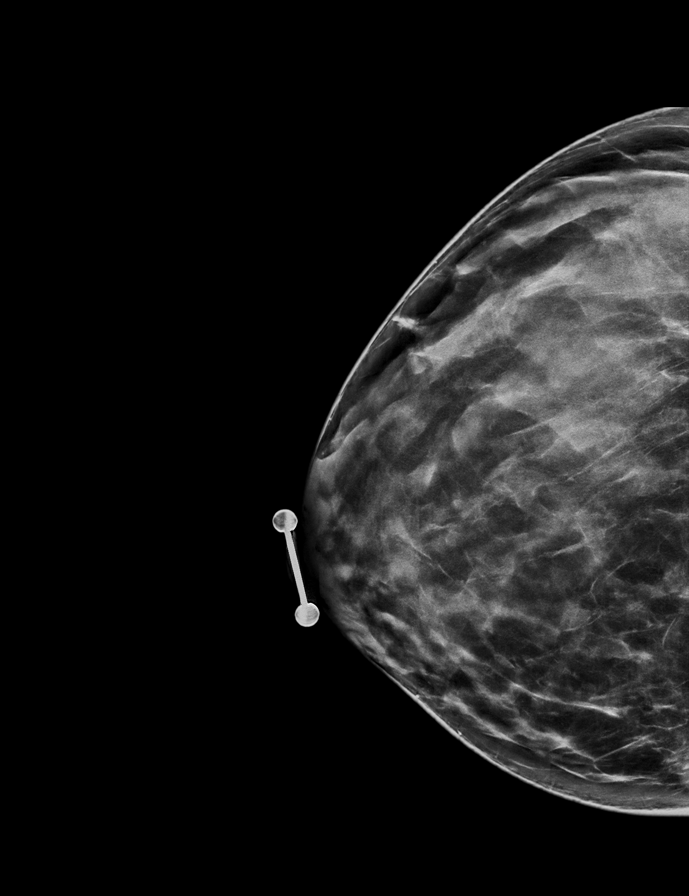

[R MLO synth-2D (2 of 2)]
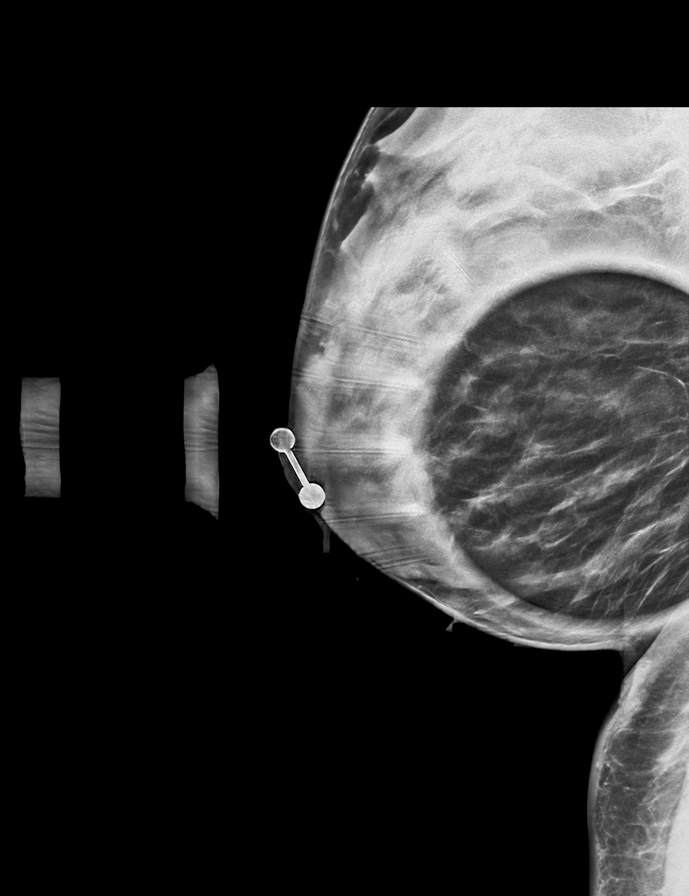

[L CC tomo · tomo slice 31/61.0]
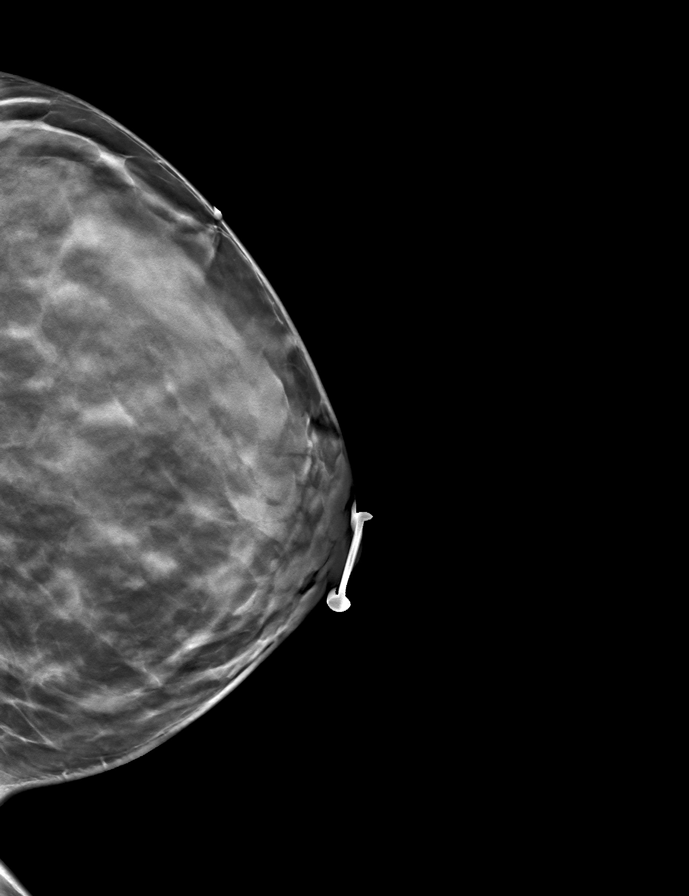

[8 of 40 positions shown; findings below may reference images not displayed]

ACR Breast Density Category d: The breast tissue is extremely dense,
which lowers the sensitivity of mammography.
FINDINGS: There are no new dominant masses, suspicious calcifications or
secondary signs of malignancy within either breast. Specifically,
there is no mammographic abnormality within the outer LEFT breast
corresponding to the area of concern to the patient.

Targeted ultrasound is performed, evaluating the upper-outer
quadrant of the LEFT breast with particular attention to the 2
o'clock axis as directed by the patient, showing only normal
fibroglandular tissues and fat lobules throughout. No solid or
cystic mass. There is a ridge of normal dense fibroglandular tissue
at the 2 o'clock axis, corresponding to the area of clinical
concern.

Redemonstrated is a benign lipoma within the LEFT axilla/mid
axillary line, increased in thickness with a demonstrated
measurement of 1.1 cm thickness (previously 7 mm). The long axis of
the lipoma is difficult to measure due to its configuration, but
measures at least 5 cm greatest dimension.
IMPRESSION: 1. No evidence of malignancy within either breast.
2. No evidence of malignancy or acute findings within the
upper-outer quadrant of the LEFT breast, corresponding to the area
of clinical concern/pain.
3. Benign lipoma within the LEFT axilla/mid axillary line, measuring
at least 5 cm greatest thickness and enlarged in thickness compared
to previous ultrasound of 06/21/2018, corresponding to patient's
palpable area of concern.

RECOMMENDATION:
1. Screening mammogram at age 41 unless there are persistent or
intervening clinical concerns. (Code:ZS-N-FY9)
2. Breast pain is a common condition, which will often resolve on
its own without intervention. Benign causes of breast pain, and
possible remedies, were discussed with the patient. Patient was
encouraged to follow-up with referring physician if the pain
persisted or worsened. Patient was instructed to return for
additional imaging if a new palpable lump developed in either
breast.
3. Patient indicated a desire to have the lipoma in the LEFT
axilla/mid axillary line removed. Consider surgical consultation.

I have discussed the findings and recommendations with the patient.
If applicable, a reminder letter will be sent to the patient
regarding the next appointment.

BI-RADS CATEGORY  2: Benign.

## 2023-10-13 ENCOUNTER — Ambulatory Visit: Payer: 59 | Admitting: Nurse Practitioner

## 2023-10-16 ENCOUNTER — Ambulatory Visit: Payer: 59 | Admitting: Nurse Practitioner

## 2023-11-02 ENCOUNTER — Ambulatory Visit: Admitting: Nurse Practitioner

## 2023-11-07 ENCOUNTER — Other Ambulatory Visit: Payer: Self-pay | Admitting: Physician Assistant

## 2023-11-07 DIAGNOSIS — E1065 Type 1 diabetes mellitus with hyperglycemia: Secondary | ICD-10-CM

## 2023-12-29 ENCOUNTER — Telehealth: Payer: Self-pay | Admitting: Nurse Practitioner

## 2023-12-29 NOTE — Telephone Encounter (Signed)
 Noted

## 2023-12-29 NOTE — Telephone Encounter (Signed)
 Copied from CRM [id number]. Topic: Medical Record Request - Other >> Dec 29, 2023  1:08 PM Zipporah Him wrote:  Reason for CRM: Moira Andrews from [facility] calling to confirm patient had a PAP recently in office and to have documentation faxed over from that appointment. She has faxed a request for that information over to the office.  >> Dec 29, 2023  3:26 PM Fredrica W wrote: Moira Andrews called back to check status of request. States patient was seen there yesterday and they need record of most recent PAP for continuation of care. Thank You

## 2023-12-29 NOTE — Telephone Encounter (Signed)
 FYI Copied from CRM [id number]. Topic: Medical Record Request - Other >> Dec 29, 2023  1:08 PM Zipporah Him wrote:  Reason for CRM: [name] from [facility] calling to confirm patient had a PAP recently in office and to have documentation faxed over from that appointment. She has faxed a request for that information over to the office.

## 2023-12-30 NOTE — Telephone Encounter (Signed)
 I forwarded patient caller to medical records dept to get what she is needing for this patient.

## 2024-02-03 ENCOUNTER — Other Ambulatory Visit: Payer: Self-pay | Admitting: Family Medicine

## 2024-02-03 DIAGNOSIS — E1065 Type 1 diabetes mellitus with hyperglycemia: Secondary | ICD-10-CM
